# Patient Record
Sex: Male | Born: 1955 | Race: White | Hispanic: No | Marital: Single | Smoking: Never smoker
Health system: Southern US, Community
[De-identification: ages and names within clinical notes are randomized; demographics above are authoritative.]

## PROBLEM LIST (undated history)

## (undated) DIAGNOSIS — I480 Paroxysmal atrial fibrillation: Secondary | ICD-10-CM

## (undated) DIAGNOSIS — M1990 Unspecified osteoarthritis, unspecified site: Secondary | ICD-10-CM

## (undated) DIAGNOSIS — I471 Supraventricular tachycardia, unspecified: Secondary | ICD-10-CM

## (undated) DIAGNOSIS — I2510 Atherosclerotic heart disease of native coronary artery without angina pectoris: Secondary | ICD-10-CM

## (undated) DIAGNOSIS — G4730 Sleep apnea, unspecified: Secondary | ICD-10-CM

## (undated) DIAGNOSIS — E119 Type 2 diabetes mellitus without complications: Secondary | ICD-10-CM

## (undated) DIAGNOSIS — I10 Essential (primary) hypertension: Secondary | ICD-10-CM

---

## 2012-07-03 DIAGNOSIS — F32A Depression, unspecified: Secondary | ICD-10-CM | POA: Diagnosis present

## 2016-03-15 DIAGNOSIS — E1165 Type 2 diabetes mellitus with hyperglycemia: Secondary | ICD-10-CM | POA: Diagnosis present

## 2016-03-15 DIAGNOSIS — E119 Type 2 diabetes mellitus without complications: Secondary | ICD-10-CM | POA: Insufficient documentation

## 2016-03-28 ENCOUNTER — Ambulatory Visit: Payer: Self-pay | Admitting: Surgery

## 2016-08-30 ENCOUNTER — Encounter (HOSPITAL_COMMUNITY): Payer: Self-pay | Admitting: Emergency Medicine

## 2016-08-30 ENCOUNTER — Emergency Department (HOSPITAL_COMMUNITY)
Admission: EM | Admit: 2016-08-30 | Discharge: 2016-08-30 | Disposition: A | Discharge status: Home / Self Care | Payer: BC Managed Care – PPO | Attending: Emergency Medicine | Admitting: Emergency Medicine

## 2016-08-30 DIAGNOSIS — Z79899 Other long term (current) drug therapy: Secondary | ICD-10-CM | POA: Insufficient documentation

## 2016-08-30 DIAGNOSIS — Y999 Unspecified external cause status: Secondary | ICD-10-CM | POA: Insufficient documentation

## 2016-08-30 DIAGNOSIS — Y9301 Activity, walking, marching and hiking: Secondary | ICD-10-CM | POA: Diagnosis not present

## 2016-08-30 DIAGNOSIS — W19XXXA Unspecified fall, initial encounter: Secondary | ICD-10-CM

## 2016-08-30 DIAGNOSIS — Y92480 Sidewalk as the place of occurrence of the external cause: Secondary | ICD-10-CM | POA: Insufficient documentation

## 2016-08-30 DIAGNOSIS — E119 Type 2 diabetes mellitus without complications: Secondary | ICD-10-CM | POA: Diagnosis not present

## 2016-08-30 DIAGNOSIS — W000XXA Fall on same level due to ice and snow, initial encounter: Secondary | ICD-10-CM | POA: Diagnosis not present

## 2016-08-30 DIAGNOSIS — S0990XA Unspecified injury of head, initial encounter: Secondary | ICD-10-CM

## 2016-08-30 DIAGNOSIS — I10 Essential (primary) hypertension: Secondary | ICD-10-CM | POA: Diagnosis not present

## 2016-08-30 DIAGNOSIS — S0101XA Laceration without foreign body of scalp, initial encounter: Secondary | ICD-10-CM

## 2016-08-30 HISTORY — DX: Unspecified osteoarthritis, unspecified site: M19.90

## 2016-08-30 HISTORY — DX: Sleep apnea, unspecified: G47.30

## 2016-08-30 HISTORY — DX: Essential (primary) hypertension: I10

## 2016-08-30 HISTORY — DX: Type 2 diabetes mellitus without complications: E11.9

## 2016-08-30 MED ORDER — LIDOCAINE-EPINEPHRINE (PF) 2 %-1:200000 IJ SOLN
10.0000 mL | Freq: Once | INTRAMUSCULAR | Status: DC
  Filled 2016-08-30: qty 20

## 2016-08-30 MED ORDER — TETANUS-DIPHTH-ACELL PERTUSSIS 5-2.5-18.5 LF-MCG/0.5 IM SUSP
0.5000 mL | Freq: Once | INTRAMUSCULAR | Status: AC
  Administered 2016-08-30: 0.5 mL via INTRAMUSCULAR
  Filled 2016-08-30: qty 0.5

## 2016-08-30 NOTE — Discharge Instructions (Signed)

## 2016-08-30 NOTE — ED Provider Notes (Signed)
Emergency Department Provider Note   I have reviewed the triage vital signs and the nursing notes.   HISTORY  Chief Complaint Fall and Head Injury   HPI Alan Lin is a 61 y.o. male presents to the emergency department for evaluation after mechanical fall. Patient was walking on icy sidewalk when his feet went out from under him and he hit the back of his head on the ground. He reports significant pain in the back of his head along with some bleeding. No loss of consciousness. Will sleepiness. No weakness in his arms or legs. No difficulty walking. Had no vomiting since the incident. He is not anticoagulated. Mild/moderate severity. No radiation.   Past Medical History:  Diagnosis Date  . Arthritis   . Diabetes mellitus without complication (HCC)   . Hypertension   . Sleep apnea     There are no active problems to display for this patient.   No past surgical history on file.  Current Outpatient Rx  . Order #: [id number] Class: Historical Med  . Order #: [id number] Class: Historical Med  . Order #: [id number] Class: Historical Med  . Order #: [id number] Class: Historical Med  . Order #: [id number] Class: Historical Med  . Order #: [id number] Class: Historical Med  . Order #: [id number] Class: Historical Med  . Order #: [id number] Class: Historical Med  . Order #: [id number] Class: Historical Med  . Order #: [id number] Class: Historical Med    Allergies Lisinopril and Morphine and related  No family history on file.  Social History Social History  Substance Use Topics  . Smoking status: Never Smoker  . Smokeless tobacco: Never Used  . Alcohol use Not on file    Review of Systems  Constitutional: No fever/chills Eyes: No visual changes. ENT: No sore throat. Cardiovascular: Denies chest pain. Respiratory: Denies shortness of breath. Gastrointestinal: No abdominal pain.  No nausea, no vomiting.  No diarrhea.  No constipation. Genitourinary: Negative for  dysuria. Musculoskeletal: Negative for back pain. Skin: Negative for rash. Positive scalp laceration.  Neurological: Negative for headaches, focal weakness or numbness.  10-point ROS otherwise negative.  ____________________________________________   PHYSICAL EXAM:  VITAL SIGNS: ED Triage Vitals  Enc Vitals Group     BP 08/30/16 1115 148/91     Pulse Rate 08/30/16 1117 102     Resp 08/30/16 1117 18     Temp 08/30/16 1117 97.7 F (36.5 C)     Temp Source 08/30/16 1117 Oral     SpO2 08/30/16 1115 98 %     Pain Score 08/30/16 1119 3   Constitutional: Alert and oriented. Well appearing and in no acute distress. Eyes: Conjunctivae are normal.  Head: 6.0 cm laceration to the occipital scalp. Wound is hemostatic. Wound cleaned and fully visualized with no evidence of FB or skull fracture. Nose: No congestion/rhinnorhea. Mouth/Throat: Mucous membranes are moist.   Neck: No stridor. No tenderness to palpation of the cervical spine.  Cardiovascular: Normal rate, regular rhythm. Good peripheral circulation. Grossly normal heart sounds.   Respiratory: Normal respiratory effort.  No retractions. Lungs CTAB. Gastrointestinal: Soft and nontender. No distention.  Musculoskeletal: No lower extremity tenderness nor edema. No gross deformities of extremities. Neurologic:  Normal speech and language. No gross focal neurologic deficits are appreciated.  Skin:  Skin is warm, dry and intact. No rash noted. Psychiatric: Mood and affect are normal. Speech and behavior are normal.  __________________________________________  RADIOLOGY  None ____________________________________________   PROCEDURES  Procedure(s) performed:   Marland Kitchen.Marland Kitchen.Laceration Repair Date/Time: 08/30/2016 12:51 PM  Performed by: LONG, Arlyss Repress Authorized by: Maia Plan   Consent:    Consent obtained:  Verbal   Consent given by:  Patient   Risks discussed:  Infection, pain, retained foreign body, vascular damage, poor wound  healing, poor cosmetic result, need for additional repair and nerve damage   Alternatives discussed:  No treatment Universal protocol:    Procedure explained and questions answered to patient or proxy's satisfaction: yes     Relevant documents present and verified: yes     Required blood products, implants, devices, and special equipment available: yes     Patient identity confirmed:  Verbally with patient Anesthesia (see MAR for exact dosages):    Anesthesia method:  Local infiltration   Local anesthetic:  Lidocaine 2% WITH epi Laceration details:    Location:  Scalp   Scalp location:  Occipital   Length (cm):  6 Repair type:    Repair type:  Simple Pre-procedure details:    Preparation:  Patient was prepped and draped in usual sterile fashion Exploration:    Hemostasis achieved with:  Direct pressure   Wound exploration: entire depth of wound probed and visualized     Wound extent: no foreign bodies/material noted, no muscle damage noted, no nerve damage noted and no vascular damage noted   Treatment:    Area cleansed with:  Betadine and saline   Amount of cleaning:  Standard   Irrigation solution:  Sterile saline   Visualized foreign bodies/material removed: no   Skin repair:    Repair method:  Sutures   Suture size:  4-0   Suture material:  Prolene   Number of sutures:  10 Approximation:    Approximation:  Close   Vermilion border: well-aligned   Post-procedure details:    Dressing:  Open (no dressing)   Patient tolerance of procedure:  Tolerated well, no immediate complications   ____________________________________________   INITIAL IMPRESSION / ASSESSMENT AND PLAN / ED COURSE  Pertinent labs & imaging results that were available during my care of the patient were reviewed by me and considered in my medical decision making (see chart for details).  Patient resents to the emergency department for evaluation of mechanical fall while slipping on the ice. No loss of  consciousness. The patient is awake and alert with no focal motor or sensory deficits. No clear indication for CT at this time based on [place] rule. Discussed wound closure with staples and with suture. Given the patient's very short hair he prefer sutures which would be less visible at work.   At this time, I do not feel there is any life-threatening condition present. I have reviewed and discussed all results (EKG, imaging, lab, urine as appropriate), exam findings with patient. I have reviewed nursing notes and appropriate previous records.  I feel the patient is safe to be discharged home without further emergent workup. Discussed usual and customary return precautions. Patient and family (if present) verbalize understanding and are comfortable with this plan.  Patient will follow-up with their primary care provider. If they do not have a primary care provider, information for follow-up has been provided to them. All questions have been answered.  ____________________________________________  FINAL CLINICAL IMPRESSION(S) / ED DIAGNOSES  Final diagnoses:  Injury of head, initial encounter  Fall, initial encounter  Laceration of scalp, initial encounter     MEDICATIONS GIVEN DURING THIS VISIT:  Medications  Tdap (BOOSTRIX) injection 0.5 mL (0.5 mLs Intramuscular Given 08/30/16 1310)  NEW OUTPATIENT MEDICATIONS STARTED DURING THIS VISIT:  None   Note:  This document was prepared using Dragon voice recognition software and may include unintentional dictation errors.  Alona Bene, MD Emergency Medicine   Maia Plan, MD 08/30/16 [phone]

## 2016-08-30 NOTE — ED Triage Notes (Signed)
slipped in black ice this am and hit the back of his head ~ 2-3 inch lac to back of head bleeding controlled at this time, pt states  Unsure of loc at this time, is on asa, no neck or back pai at this time. , ambulatory at scene

## 2016-11-29 DIAGNOSIS — M25519 Pain in unspecified shoulder: Secondary | ICD-10-CM | POA: Insufficient documentation

## 2017-05-15 ENCOUNTER — Other Ambulatory Visit: Payer: Self-pay | Admitting: Surgery

## 2017-05-15 DIAGNOSIS — R1900 Intra-abdominal and pelvic swelling, mass and lump, unspecified site: Secondary | ICD-10-CM

## 2017-05-16 ENCOUNTER — Other Ambulatory Visit: Payer: BC Managed Care – PPO

## 2017-05-16 ENCOUNTER — Ambulatory Visit
Admission: RE | Admit: 2017-05-16 | Discharge: 2017-05-16 | Disposition: A | Discharge status: Home / Self Care | Payer: BC Managed Care – PPO | Source: Ambulatory Visit | Attending: Surgery | Admitting: Surgery

## 2017-05-16 DIAGNOSIS — R1900 Intra-abdominal and pelvic swelling, mass and lump, unspecified site: Secondary | ICD-10-CM

## 2017-05-16 MED ORDER — IOPAMIDOL (ISOVUE-300) INJECTION 61%
100.0000 mL | Freq: Once | INTRAVENOUS | Status: AC | PRN
  Administered 2017-05-16: 100 mL via INTRAVENOUS

## 2017-05-23 ENCOUNTER — Encounter: Payer: Self-pay | Admitting: Physical Therapy

## 2017-05-23 ENCOUNTER — Ambulatory Visit: Payer: BC Managed Care – PPO | Attending: Surgery | Admitting: Physical Therapy

## 2017-05-23 DIAGNOSIS — R279 Unspecified lack of coordination: Secondary | ICD-10-CM | POA: Diagnosis present

## 2017-05-23 DIAGNOSIS — M6281 Muscle weakness (generalized): Secondary | ICD-10-CM | POA: Diagnosis present

## 2017-05-23 NOTE — Therapy (Signed)
[facility] [address], [address] [place], [place], [address] Phone: [phone]   Fax:  [phone]  Physical Therapy Evaluation  Patient Details  Name: CRAIGORY TOSTE MRN: [id number] Date of Birth: 06/09/1956 Referring Provider: Dortha Schwalbe, MD   Encounter Date: 05/23/2017  PT End of Session - 05/23/17 1617    Visit Number  1    Date for PT Re-Evaluation  07/04/17    PT Start Time  1235    PT Stop Time  1314    PT Time Calculation (min)  39 min    Activity Tolerance  Patient tolerated treatment well    Behavior During Therapy  [facility] for tasks assessed/performed       Past Medical History:  Diagnosis Date  . Arthritis   . Diabetes mellitus without complication (HCC)   . Hypertension   . Sleep apnea     History reviewed. No pertinent surgical history.  There were no vitals filed for this visit.   Subjective Assessment - 05/23/17 1236    Subjective  Patient fell in March on ice and hit left leg and had RTC repair on right in July.  Pt had lipoma bulging on the right side of abdomen several months ago, then a few weeks ago the left side of abdomen began to bulged.  Pt was assessed and MD referred for abdominal wall weakness.    Pertinent History  Lt TKA, Rt RTC repair, bilateral inguinal hernia    Limitations  Other (comment);House hold activities work    Patient Stated Goals  get stronger, be able to get back to exercise like swimming, get up off the floor more easily    Currently in Pain?  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    Vocation Requirements  up and down from the floor, lifting, walking      Cognition   Overall Cognitive Status  Within Functional Limits for tasks assessed      Posture/Postural Control   Posture/Postural Control  Postural limitations    Postural Limitations  Rounded Shoulders;Increased thoracic kyphosis ribcage flared Lt>Rt      ROM / Strength   AROM / PROM / Strength  Strength      Strength   Overall Strength Comments  Rt LE 5/5; Lt LE 4/5 hip ER, IR,and  Abduction      Palpation   SI assessment   WNL, no obliquity noted    Palpation comment  soft tissue bulge       Transfers   Transfers  Sit to Stand;Floor to Transfer    Sit to Stand  7: Independent    Floor to Transfer  7: Independent;With upper extremity  assist stands up from floor uses valsalva maneuver             Objective measurements completed on examination: See above findings.    Pelvic Floor Special Questions - 05/23/17 0001    Prior Pelvic/Prostate Exam  Yes    Date of Last Pelvic/Prostate Exam  -- due for the 11th    Urinary Leakage  No       [facility] Adult PT Treatment/Exercise - 05/23/17 0001      Neuro Re-ed    Neuro Re-ed Details   educated and performed floor to stand transfer with correct breathing pattern, breathing with ball squeeze and breathing with abdominal contraction             PT Education - 05/23/17 1617    Education provided  Yes    Education Details  ball squeeze with abdominal bracing, "blow before you go"    Person(s) Educated  Patient    Methods  Explanation;Demonstration;Verbal cues;Handout    Comprehension  Verbalized understanding;Returned demonstration          PT Long Term Goals - 05/23/17 1753      PT LONG TERM GOAL #1   Title  ind with final HEP    Time  6    Period  Weeks    Status  New     Target Date  07/04/17      PT LONG TERM GOAL #2   Title  pt will report 50% less discomfort during typical work shift    Time  6    Period  Weeks    Status  New    Target Date  07/04/17      PT LONG TERM GOAL #3   Title  pt will demonstrate  trunk stability without using UE when standing up from half kneeling position    Time  6    Period  Weeks    Status  New    Target Date  07/04/17      PT LONG TERM GOAL #4   Title  pt will have 4+/5 or greater MMT of Lt hip    Time  6    Period  Weeks    Status  New    Target Date  07/04/17      PT LONG TERM GOAL #5   Title  Pt will reduce abdomen circumference by 5 cm due to improved core strength    Baseline  need to take measurement next visit    Time  6    Period  Weeks    Status  New    Target Date  07/04/17             Plan - 05/23/17 1742    Clinical Impression Statement  Patient is very friendly 61 y/o male presenting to clinic due to discomfort and distension of left abdomen.  Pt received CT scan that reports no observable mass or lipoma . Pt does have bilateral inguinal hernia of adipose tissue, but no pain associated with that.  Pt has physical job and is having more difficulty with performing job related duties as well as unable to participate in his normal exercise routine due to discomfort. Pt demonstrates overusing valsava maneuver during functional movements.  He has increase ribage flaring due to abdominal weakness.  Pt has right LE weakness of 4/5 in hip.  Pt has postural deficits and will benefit from skilled PT to work on core and postural strength with improved coordination of  muscles for stability during functional activities.    History and Personal Factors relevant to plan of care:  Lt knee replacement, fall on left side in March, Rt RTC repair July, bilateral inguinal hernia    Clinical Presentation  Stable    Clinical Presentation due to:  pt is stable    Clinical Decision Making  Moderate    Rehab Potential   Good    PT Frequency  1x / week    PT Duration  6 weeks    PT Treatment/Interventions  ADLs/Self Care Home Management;Biofeedback;Cryotherapy;Electrical Stimulation;Therapeutic exercise;Therapeutic activities;Functional mobility training;Stair training;Gait training;Neuromuscular re-education;Patient/family education;Manual techniques;Passive range of motion;Dry needling;Taping    PT Next Visit Plan  measure abdominal circumference, core strength, breathing, rib mobs, functional movements with breath    Consulted and Agree with Plan of Care  Patient       Patient will benefit from skilled therapeutic intervention in order to improve the following deficits and impairments:  Pain, Improper body mechanics, Postural dysfunction, Decreased coordination, Decreased strength  Visit Diagnosis: Muscle weakness (generalized)  Unspecified lack of coordination     Problem List There are no active problems to display for this patient.   Vincente PoliJakki Crosser, PT 05/23/2017, 5:58 PM  [facility] [address] [place], [place], [address] Phone: [phone]   Fax:  [phone]  Name: Everett GraffDavid M Lia MRN: [id number] Date of Birth: 01/20/1956

## 2017-05-23 NOTE — Patient Instructions (Signed)
   HIP ADDUCTION SQUEEZE - SUPINE  Place a rolled up towel, ball or pillow between your knees and press your knees together so that you squeeze the object firmly and engage the abdominals. Hold 5 sec and then release and repeat 2 sets of 10   [facility] [address], [address] [place], [place] [address] Phone # [phone] Fax [phone]

## 2017-05-30 ENCOUNTER — Ambulatory Visit: Payer: BC Managed Care – PPO | Admitting: Physical Therapy

## 2017-05-30 DIAGNOSIS — R279 Unspecified lack of coordination: Secondary | ICD-10-CM

## 2017-05-30 DIAGNOSIS — M6281 Muscle weakness (generalized): Secondary | ICD-10-CM

## 2017-05-30 NOTE — Patient Instructions (Signed)
Abduction: Clam (Eccentric) - Side-Lying    Lie on side with knees bent. Lift top knee, as you exhale and gently contract abdominals.  keeping feet together. Keep trunk steady. Slowly lower for 3-5 seconds. __10_ reps per set, __2_ sets per day, __5-7_ days per week.   http://ecce.exer.us/64   Copyright  VHI. All rights reserved.    Bracing With Bridging (Hook-Lying)    With neutral spine, tighten pelvic floor and abdominals and hold. Lift bottom. Repeat __10_ times. Do _2__ times a day.   Copyright  VHI. All rights reserved.    Sit to stand - inhale abdomen should bulge slightly, exhale tighten abdomen and stand on the exhale - 10x 2 sets  Balloon Breath    Place hands LIGHTLY on belly below navel. Imagine a balloon inside belly. Blow up balloon on breath IN, deflate balloon on breath OUT. Contract abdominals slightly to assist breath OUT. Time _5__ minutes.  Copyright  VHI. All rights reserved.    [facility] [address], [address] [place], [place] [address] Phone # [phone] Fax [phone]

## 2017-05-30 NOTE — Therapy (Signed)
[facility] [address] [place], [place], [address] Phone: [phone]   Fax:  [phone]  Physical Therapy Treatment  Patient Details  Name: Alan GraffDavid M Hinshaw MRN: [id number] Date of Birth: 07/02/1955 Referring Provider: Dortha Schwalbehomas A Cornett, MD   Encounter Date: 05/30/2017  PT End of Session - 05/30/17 1549    Visit Number  2    Date for PT Re-Evaluation  07/04/17    PT Start Time  1407    PT Stop Time  1452    PT Time Calculation (min)  45 min    Activity Tolerance  Patient tolerated treatment well    Behavior During Therapy  [facility] for tasks assessed/performed       Past Medical History:  Diagnosis Date  . Arthritis   . Diabetes mellitus without complication (HCC)   . Hypertension   . Sleep apnea     No past surgical history on file.  There were no vitals filed for this visit.  Subjective Assessment - 05/30/17 1409    Subjective  I don't feel pain but I have discomfort especially when sitting.  Getting up and down is a little easier doing it the way you showed me last time.    Pertinent History  Lt TKA, Rt RTC repair, bilateral inguinal hernia    Limitations  Other (comment);House hold activities    Patient Stated Goals  get stronger, be able to get back to exercise like swimming, get up off the floor more easily    Currently in Pain?  No/denies         [facility] PT Assessment - 05/30/17 0001      Observation/Other Assessments-Edema    Edema  Circumferential bulge in abdomen: 50 inch                  [facility] Adult PT Treatment/Exercise - 05/30/17 0001      Therapeutic Activites    Therapeutic Activities  Other Therapeutic Activities    Other Therapeutic Activities  sit to stand with abdominal brace      Neuro Re-ed    Neuro Re-ed Details   TrA activation with breathing and supine exercises,      Exercises   Exercises  Lumbar      Lumbar Exercises: Seated   Sit to Stand  15 reps tactile  and verbal cues      Lumbar Exercises: Supine   Clam  10 reps single leg; 10x each side    Clam Limitations  green band    Bent Knee Raise  10 reps 10x each side with exhale on the lift    Bridge  10 reps with green band around knees      Lumbar Exercises: Sidelying   Clam  10 reps sidelying on left side    Other Sidelying Lumbar Exercises  breathing in sidelying with abdominal bracing             PT Education - 05/30/17 1548    Education provided  Yes    Education Details  clam with ab set, bridge, sit to stand with brace, balloon breathing    Person(s) Educated  Patient    Methods  Explanation;Demonstration;Verbal cues;Handout    Comprehension  Verbalized understanding;Returned demonstration          PT Long Term Goals - 05/30/17 1549      PT LONG TERM GOAL #1   Title  ind with final HEP    Time  6  Period  Weeks    Status  On-going      PT LONG TERM GOAL #2   Title  pt will report 50% less discomfort during typical work shift    Time  6    Period  Weeks    Status  On-going      PT LONG TERM GOAL #3   Title  pt will demonstrate  trunk stability without using UE when standing up from half kneeling position    Time  6    Period  Weeks    Status  On-going      PT LONG TERM GOAL #4   Title  pt will have 4+/5 or greater MMT of Lt hip    Time  6    Period  Weeks    Status  On-going      PT LONG TERM GOAL #5   Title  Pt will reduce abdomen circumference by 5 cm due to improved core strength    Time  6    Period  Weeks    Status  On-going            Plan - 05/30/17 1415    Clinical Impression Statement  Patient had difficulty coordinating exhale with movements and needed a moderate amount of verbal cues.  He tends to hold breath when performing activities.  Pt did well with cues in supine, was more challenging in sitting.  He did not have increased pain or discomfort during session.  Pt needs skilled PT to work on core strength and stability during  functional movments.    PT Treatment/Interventions  ADLs/Self Care Home Management;Biofeedback;Cryotherapy;Electrical Stimulation;Therapeutic exercise;Therapeutic activities;Functional mobility training;Stair training;Gait training;Neuromuscular re-education;Patient/family education;Manual techniques;Passive range of motion;Dry needling;Taping    PT Next Visit Plan  core strength in variety of positions, breathing, rib mobs, functional movements with abdominal bracing    Consulted and Agree with Plan of Care  Patient       Patient will benefit from skilled therapeutic intervention in order to improve the following deficits and impairments:  Pain, Improper body mechanics, Postural dysfunction, Decreased coordination, Decreased strength  Visit Diagnosis: Muscle weakness (generalized)  Unspecified lack of coordination     Problem List There are no active problems to display for this patient.   Vincente PoliJakki Crosser, PT 05/30/2017, 4:11 PM  [facility] [address] [place], [place], [address] Phone: [phone]   Fax:  [phone]  Name: Alan GraffDavid M Rock MRN: [id number] Date of Birth: 03/23/1956

## 2017-06-06 ENCOUNTER — Encounter: Payer: Self-pay | Admitting: Physical Therapy

## 2017-06-06 ENCOUNTER — Ambulatory Visit: Payer: BC Managed Care – PPO | Admitting: Physical Therapy

## 2017-06-06 DIAGNOSIS — M6281 Muscle weakness (generalized): Secondary | ICD-10-CM

## 2017-06-06 DIAGNOSIS — R279 Unspecified lack of coordination: Secondary | ICD-10-CM

## 2017-06-06 NOTE — Therapy (Signed)
[facility] [address], [address] [place], [place], [address] Phone: [phone]   Fax:  [phone]  Physical Therapy Treatment  Patient Details  Name: Alan GraffDavid M Keetch MRN: [id number] Date of Birth: 04/05/1956 Referring Provider: Dortha Schwalbehomas A Cornett, MD   Encounter Date: 06/06/2017  PT End of Session - 06/06/17 1410    Visit Number  3    Date for PT Re-Evaluation  07/04/17    PT Start Time  1401    PT Stop Time  1445    PT Time Calculation (min)  44 min    Activity Tolerance  Patient tolerated treatment well    Behavior During Therapy  [facility] for tasks assessed/performed       Past Medical History:  Diagnosis Date  . Arthritis   . Diabetes mellitus without complication (HCC)   . Hypertension   . Sleep apnea     History reviewed. No pertinent surgical history.  There were no vitals filed for this visit.  Subjective Assessment - 06/06/17 1406    Subjective  I am feeling discomfort and rubbing the left side helps it feel a little better.      Pertinent History  Lt TKA, Rt RTC repair, bilateral inguinal hernia    Limitations  Other (comment);House hold activities    Patient Stated Goals  get stronger, be able to get back to exercise like swimming, get up off the floor more easily    Currently in Pain?  No/denies                      [facility] - 06/06/17 0001      Lumbar Exercises: Standing   Other Standing Lumbar Exercises  wall push ups      Lumbar Exercises: Seated   Sit to Stand  20 reps holding 4lb and keeping knees neutral, exhale with lift      Lumbar Exercises: Supine   Ab Set  15 reps rolling red ball with abdominal brace    Other Supine Lumbar Exercises  foam roller vertical under sacrum - single and double knee to chest; rocking side to side      Knee/Hip Exercises: Standing   Hip Abduction  Stengthening;Right;Left;3 sets;10 reps side stepping 10 steps each way x 3  - green band around LE    SLS  one foot on BOSU UE flexion, pallof punches and rotation - 15x each side - green band      Knee/Hip Exercises: Seated   Sit to Sand  15 reps;without UE support bracing when standing             PT Education - 06/06/17 1537    Education provided  Yes    Education Details  side step, trunk rotation with band     Person(s) Educated  Patient    Methods  Explanation;Demonstration;Verbal cues;Handout    Comprehension  Verbalized understanding;Returned demonstration          PT Long Term Goals - 05/30/17 1549      PT LONG TERM GOAL #1   Title  ind with final HEP    Time  6    Period  Weeks    Status  On-going      PT LONG TERM GOAL #2   Title  pt will report 50% less discomfort during typical work shift    Time  6    Period  Weeks    Status  On-going  PT LONG TERM GOAL #3   Title  pt will demonstrate  trunk stability without using UE when standing up from half kneeling position    Time  6    Period  Weeks    Status  On-going      PT LONG TERM GOAL #4   Title  pt will have 4+/5 or greater MMT of Lt hip    Time  6    Period  Weeks    Status  On-going      PT LONG TERM GOAL #5   Title  Pt will reduce abdomen circumference by 5 cm due to improved core strength    Time  6    Period  Weeks    Status  On-going            Plan - 06/06/17 1538    Clinical Impression Statement  Patient needs cues during all exercises to breathe upon exertion.  He needed verbal and tactile cues to keep shoulders down and not using upper traps.  Pt was able to perform exercises today without pain and was able to increase level of difficulty adding some lifting and single leg standing exercises.  Pt continues to need skilled PT to work on strength and core stability during functional movements    Rehab Potential  Good    PT Treatment/Interventions  ADLs/Self Care Home Management;Biofeedback;Cryotherapy;Electrical Stimulation;Therapeutic  exercise;Therapeutic activities;Functional mobility training;Stair training;Gait training;Neuromuscular re-education;Patient/family education;Manual techniques;Passive range of motion;Dry needling;Taping    PT Next Visit Plan  core strength in variety of positions, breathing, functional movements with abdominal bracing, lunges    Consulted and Agree with Plan of Care  Patient       Patient will benefit from skilled therapeutic intervention in order to improve the following deficits and impairments:  Pain, Improper body mechanics, Postural dysfunction, Decreased coordination, Decreased strength  Visit Diagnosis: Muscle weakness (generalized)  Unspecified lack of coordination     Problem List There are no active problems to display for this patient.   Vincente PoliJakki Crosser, PT 06/06/2017, 3:50 PM  [facility] [address], [address] [place], [place], [address] Phone: [phone]   Fax:  [phone]  Name: Alan GraffDavid M [place] MRN: [id number] Date of Birth: 12/05/1955

## 2017-06-06 NOTE — Patient Instructions (Signed)
   Rotation keeping your hips neutral and rotating at the core - exhale as you rotate 20x each side     Keep abdominals braced as you step to the side - 30x to each side  [facility] [address], [address] [place], [place] [address] Phone # [phone] Fax [phone]

## 2017-06-14 ENCOUNTER — Ambulatory Visit: Payer: BC Managed Care – PPO | Admitting: Physical Therapy

## 2017-06-14 ENCOUNTER — Encounter: Payer: Self-pay | Admitting: Physical Therapy

## 2017-06-14 DIAGNOSIS — M6281 Muscle weakness (generalized): Secondary | ICD-10-CM

## 2017-06-14 DIAGNOSIS — R279 Unspecified lack of coordination: Secondary | ICD-10-CM

## 2017-06-14 NOTE — Patient Instructions (Signed)
Sitting on the ball exercises:   You want the largest size to sit on.   Sit, make sure legs are not super wide, right angles hips and knees. Sit tall: pull lower abs in and up/hugging the spine. Feel your ribs lift away from the pelvis. Soften the chin and upper shoulder/neck muscles.   Add arm movements, shoulder stretches, and practice the marching/knee lift. Remember the most important thing is to preserve the posture and core contraction thoughout.

## 2017-06-14 NOTE — Therapy (Signed)
[facility] [address], [address] [place], [place], [address] Phone: [phone]   Fax:  [phone]  Physical Therapy Treatment  Patient Details  Name: Alan GraffDavid M [place]ine MRN: [id number] Date of Birth: 10/23/1955 Referring Provider: Dortha Schwalbehomas A Cornett, MD   Encounter Date: 06/14/2017  PT End of Session - 06/14/17 0934    Visit Number  4    Date for PT Re-Evaluation  07/04/17    PT Start Time  0934    PT Stop Time  1015    PT Time Calculation (min)  41 min    Activity Tolerance  Patient tolerated treatment well    Behavior During Therapy  [facility] for tasks assessed/performed       Past Medical History:  Diagnosis Date  . Arthritis   . Diabetes mellitus without complication (HCC)   . Hypertension   . Sleep apnea     History reviewed. No pertinent surgical history.  There were no vitals filed for this visit.  Subjective Assessment - 06/14/17 0935    Subjective  Just got up, feeling not too bad this AM.     Pertinent History  Lt TKA, Rt RTC repair, bilateral inguinal hernia    Limitations  Other (comment);House hold activities    Patient Stated Goals  get stronger, be able to get back to exercise like swimming, get up off the floor more easily    Currently in Pain?  No/denies    Multiple Pain Sites  No                      [facility] Adult PT Treatment/Exercise - 06/14/17 0001      Lumbar Exercises: Aerobic   Stationary Bike  L1 x 6 min PTA present for status.      Lumbar Exercises: Standing   Other Standing Lumbar Exercises  green band side steps 20 feet 6x VC for posture and core activation      Lumbar Exercises: Seated   Sit to Stand  -- Green band horiz abd 2x10/VC for core  and LE alignment      Knee/Hip Exercises: Standing   SLS  Level surface 3x 20 sec  Cannot do without some UE support             PT Education - 06/14/17 1009    Education provided  Yes    Education Details  Physioball  exercises for core strength that pt can go at the gym    Person(s) Educated  Patient    Methods  Explanation;Demonstration;Tactile cues;Verbal cues;Handout    Comprehension  Verbalized understanding;Returned demonstration          PT Long Term Goals - 05/30/17 1549      PT LONG TERM GOAL #1   Title  ind with final HEP    Time  6    Period  Weeks    Status  On-going      PT LONG TERM GOAL #2   Title  pt will report 50% less discomfort during typical work shift    Time  6    Period  Weeks    Status  On-going      PT LONG TERM GOAL #3   Title  pt will demonstrate  trunk stability without using UE when standing up from half kneeling position    Time  6    Period  Weeks    Status  On-going      PT LONG  TERM GOAL #4   Title  pt will have 4+/5 or greater MMT of Lt hip    Time  6    Period  Weeks    Status  On-going      PT LONG TERM GOAL #5   Title  Pt will reduce abdomen circumference by 5 cm due to improved core strength    Time  6    Period  Weeks    Status  On-going            Plan - 06/14/17 0935    Clinical Impression Statement  Pt motivated to exercise and appears compliant with all HEP. We discussed adding his swimming back in and/or walking on his off days. We added some physioball exercises he can try at the gym for core strength. Pt struggles with his LTLE proprioception, but improves with practice.     Rehab Potential  Good    PT Frequency  1x / week    PT Duration  6 weeks    PT Treatment/Interventions  ADLs/Self Care Home Management;Biofeedback;Cryotherapy;Electrical Stimulation;Therapeutic exercise;Therapeutic activities;Functional mobility training;Stair training;Gait training;Neuromuscular re-education;Patient/family education;Manual techniques;Passive range of motion;Dry needling;Taping    PT Next Visit Plan  core strength in variety of positions, breathing, functional movements with abdominal bracing, lunges    Consulted and Agree with Plan of Care   Patient       Patient will benefit from skilled therapeutic intervention in order to improve the following deficits and impairments:  Pain, Improper body mechanics, Postural dysfunction, Decreased coordination, Decreased strength  Visit Diagnosis: Muscle weakness (generalized)  Unspecified lack of coordination     Problem List There are no active problems to display for this patient.   Aras Albarran, PTA 06/14/2017, 10:17 AM  [facility] [address], [address] [place], [place], [address] Phone: [phone]   Fax:  [phone]  Name: Alan GraffDavid M Emmanuel MRN: [id number] Date of Birth: 04/01/1956

## 2017-06-26 ENCOUNTER — Ambulatory Visit: Payer: BC Managed Care – PPO | Admitting: Physical Therapy

## 2017-06-28 ENCOUNTER — Encounter: Payer: Self-pay | Admitting: Physical Therapy

## 2017-06-28 ENCOUNTER — Ambulatory Visit: Payer: BC Managed Care – PPO | Attending: Surgery | Admitting: Physical Therapy

## 2017-06-28 DIAGNOSIS — R279 Unspecified lack of coordination: Secondary | ICD-10-CM | POA: Insufficient documentation

## 2017-06-28 DIAGNOSIS — M6281 Muscle weakness (generalized): Secondary | ICD-10-CM | POA: Insufficient documentation

## 2017-06-28 NOTE — Therapy (Signed)
[facility] [address] [place], [place], [address] Phone: [phone]   Fax:  [phone]  Physical Therapy Treatment  Patient Details  Name: Alan Lin MRN: [id number] Date of Birth: 05-Jun-1956 Referring Provider: Dortha Schwalbe, MD   Encounter Date: 06/28/2017  PT End of Session - 06/28/17 1023    Visit Number  5    Date for PT Re-Evaluation  07/04/17    PT Start Time  1018    PT Stop Time  1100    PT Time Calculation (min)  42 min    Activity Tolerance  Patient tolerated treatment well    Behavior During Therapy  [facility] for tasks assessed/performed       Past Medical History:  Diagnosis Date  . Arthritis   . Diabetes mellitus without complication (HCC)   . Hypertension   . Sleep apnea     History reviewed. No pertinent surgical history.  There were no vitals filed for this visit.  Subjective Assessment - 06/28/17 1022    Subjective  I am doing the exercises every day.  I have a headache today because of a dental issue.      Pertinent History  Lt TKA, Rt RTC repair, bilateral inguinal hernia    Limitations  Other (comment);House hold activities    Patient Stated Goals  get stronger, be able to get back to exercise like swimming, get up off the floor more easily    Currently in Pain?  No/denies                      [facility] Adult PT Treatment/Exercise - 06/28/17 0001      Neuro Re-ed    Neuro Re-ed Details   Posture alignment pelvis, ribcage, shoulder girdle, cervical alignment in sitting position, cervical retraction in supine and standing      Lumbar Exercises: Aerobic   Stationary Bike  L1 x 8 min PT present for status.      Knee/Hip Exercises: Standing   Walking with Sports Cord  25 lb 4 ways - 8x each slow and controlled with core engaged                  PT Long Term Goals - 06/28/17 1025      PT LONG TERM GOAL #1   Title  ind with final HEP    Time  6    Period  Weeks    Status  On-going      PT LONG TERM GOAL #2   Title  pt will report 50% less discomfort during typical work shift    Baseline  about the same, but feeling stronger, can do what I need to do at work      PT LONG TERM GOAL #3   Title  pt will demonstrate  trunk stability without using UE when standing up from half kneeling position    Baseline  was able to get up and down from floor    Time  6    Period  Weeks    Status  Achieved      PT LONG TERM GOAL #4   Title  pt will have 4+/5 or greater MMT of Lt hip    Time  6    Period  Weeks            Plan - 06/28/17 1105    Clinical Impression Statement  Patient demonstrated improved posture with cues in  sitting to find relaxed position.  He had some difficutly with cervical retraction and upper trap spasming when doing cervical retraction in standing and supine and end range of motion.  He was able to perform correctly with verbal and tactile cues.  He has been able to get up from the floor with greater control with some UE support.  Pt will most likely discharge at next session if he feels confident with the HEP to progress core strength.    PT Treatment/Interventions  ADLs/Self Care Home Management;Biofeedback;Cryotherapy;Electrical Stimulation;Therapeutic exercise;Therapeutic activities;Functional mobility training;Stair training;Gait training;Neuromuscular re-education;Patient/family education;Manual techniques;Passive range of motion;Dry needling;Taping    PT Next Visit Plan  posture, thoracic extension, lying supine on foam roller, final HEP and MMT, possible discharge       Patient will benefit from skilled therapeutic intervention in order to improve the following deficits and impairments:  Pain, Improper body mechanics, Postural dysfunction, Decreased coordination, Decreased strength  Visit Diagnosis: Muscle weakness (generalized)  Unspecified lack of coordination     Problem List There are no active  problems to display for this patient.   Vincente PoliJakki Crosser, PT 06/28/2017, 11:19 AM  [facility] [address], [address] [place], [place], [address] Phone: [phone]   Fax:  [phone]  Name: Alan Lin MRN: [id number] Date of Birth: 10/24/1955

## 2017-07-03 ENCOUNTER — Ambulatory Visit: Payer: BC Managed Care – PPO | Admitting: Physical Therapy

## 2017-07-04 ENCOUNTER — Ambulatory Visit: Payer: BC Managed Care – PPO | Admitting: Physical Therapy

## 2017-07-04 ENCOUNTER — Encounter: Payer: Self-pay | Admitting: Physical Therapy

## 2017-07-04 DIAGNOSIS — M6281 Muscle weakness (generalized): Secondary | ICD-10-CM

## 2017-07-04 DIAGNOSIS — R279 Unspecified lack of coordination: Secondary | ICD-10-CM

## 2017-07-04 NOTE — Patient Instructions (Signed)
   BRIDGE - MARCHING  While lying on your back, tighten your lower abdominals, squeeze your buttocks and then raise your buttocks off the floor/bed as creating a "Bridge" with your body.   While holding this position, lift one leg while maintaining a level pelvis. Set it back to the floor and then lift the opposite leg.   10x each side    Keep low back pressed against mat or floor while doing this exercise, tap foot down close to your bottom  3 sets of 10 [facility] [address], [address] [place], [place] [address] Phone # [phone] Fax [phone]

## 2017-07-04 NOTE — Therapy (Addendum)
[facility] [address], [address] [place], [place], [address] Phone: [phone]   Fax:  [phone]  Physical Therapy Treatment  Patient Details  Name: Alan Lin MRN: [id number] Date of Birth: 09/17/1955 Referring Provider: Thomas A Cornett, MD   Encounter Date: 07/04/2017  PT End of Session - 07/04/17 1014    Visit Number  6    Date for PT Re-Evaluation  07/04/17    PT Start Time  1012    PT Stop Time  1059    PT Time Calculation (min)  47 min    Activity Tolerance  Patient tolerated treatment well    Behavior During Therapy  WFL for tasks assessed/performed       Past Medical History:  Diagnosis Date  . Arthritis   . Diabetes mellitus without complication (HCC)   . Hypertension   . Sleep apnea     History reviewed. No pertinent surgical history.  There were no vitals filed for this visit.  Subjective Assessment - 07/04/17 1018    Subjective  The neck hurt for the rest of the day after the last session.  Pt reports feeling like he is more aware of his weakness.    Pertinent History  Lt TKA, Rt RTC repair, bilateral inguinal hernia    Limitations  Other (comment);House hold activities    Patient Stated Goals  get stronger, be able to get back to exercise like swimming, get up off the floor more easily    Currently in Pain?  No/denies                      [facility] Adult PT Treatment/Exercise - 07/04/17 0001      Lumbar Exercises: Aerobic   Stationary Bike  L1 x 9 min      Lumbar Exercises: Standing   Shoulder ADduction  Strengthening;Both;20 reps power tower - PNF adduction - 20 lb     Other Standing Lumbar Exercises  wall push up 10x; wall push up on ball 10x      Lumbar Exercises: Supine   Dead Bug  10 reps LE only 3 sets    Bridge  10 reps mini bridge with marching    Large Ball Abdominal Isometric  10 reps    Other Supine Lumbar Exercises  pelvic tilt, 5 sec hold - 10x      Lumbar  Exercises: Quadruped   Single Arm Raise  5 reps;Right;Left;1 second    Straight Leg Raise  5 reps      Knee/Hip Exercises: Machines for Strengthening   Total Gym Leg Press  bilateral 80 lb 20x; single leg 3 x10 50 lb             PT Education - 07/04/17 1059    Education provided  Yes    Education Details  bridge march and dying bug    Person(s) Educated  Patient    Methods  Explanation;Demonstration;Handout;Verbal cues;Tactile cues    Comprehension  Verbalized understanding;Returned demonstration          PT Long Term Goals - 07/04/17 1015      PT LONG TERM GOAL #1   Title  ind with final HEP    Time  6    Period  Weeks    Status  On-going      PT LONG TERM GOAL #2   Title  pt will report 50% less discomfort during typical work shift    Baseline    I feel about the same, the discomfort varies, usually gets worse as the day goes on    Time  6    Period  Weeks    Status  On-going      PT LONG TERM GOAL #4   Title  pt will have 4+/5 or greater MMT of Lt hip    Time  6    Period  Weeks    Status  On-going      PT LONG TERM GOAL #5   Title  Pt will reduce abdomen circumference by 5 cm due to improved core strength    Baseline  need to take measurement next visit    Time  6    Status  On-going            Plan - 07/04/17 1113    Clinical Impression Statement  Patient did well with exercises today needing instructions during all exercises for improved form and body awareness.  Pt felt good doing bridges with marching and dying bug exercises and felt those were challenging but not too difficult.  Pt will benefit from skilled PT to continue working on core and functional movements with improved stability.    PT Treatment/Interventions  ADLs/Self Care Home Management;Biofeedback;Cryotherapy;Electrical Stimulation;Therapeutic exercise;Therapeutic activities;Functional mobility training;Stair training;Gait training;Neuromuscular re-education;Patient/family  education;Manual techniques;Passive range of motion;Dry needling;Taping    PT Next Visit Plan  single leg and unilateral exercises for improved core and postural symmetry, final HEP, most likely discharge today    Consulted and Agree with Plan of Care  Patient       Patient will benefit from skilled therapeutic intervention in order to improve the following deficits and impairments:  Pain, Improper body mechanics, Postural dysfunction, Decreased coordination, Decreased strength  Visit Diagnosis: Muscle weakness (generalized)  Unspecified lack of coordination     Problem List There are no active problems to display for this patient.   Jakki Crosser, PT 07/04/2017, 11:37 AM  [facility] [address], [address] [place], [place], [address] Phone: [phone]   Fax:  [phone]  Name: Alan Lin MRN: [id number] Date of Birth: 07/08/1955  PHYSICAL THERAPY DISCHARGE SUMMARY  Visits from Start of Care: 6  Current functional level related to goals / functional outcomes: See above details   Remaining deficits: See above   Education / Equipment: HEP  Plan: Patient agrees to discharge.  Patient goals were not met. Patient is being discharged due to not returning since the last visit.  ?????     Pt was potentially going to come for one more visit and did not return  Jakki Crosser, PT 08/23/17 8:40 AM  

## 2017-07-24 DIAGNOSIS — R198 Other specified symptoms and signs involving the digestive system and abdomen: Secondary | ICD-10-CM | POA: Insufficient documentation

## 2018-07-30 DIAGNOSIS — M7918 Myalgia, other site: Secondary | ICD-10-CM | POA: Insufficient documentation

## 2018-07-30 DIAGNOSIS — M25551 Pain in right hip: Secondary | ICD-10-CM | POA: Insufficient documentation

## 2018-07-30 DIAGNOSIS — M170 Bilateral primary osteoarthritis of knee: Secondary | ICD-10-CM | POA: Insufficient documentation

## 2019-07-16 DIAGNOSIS — F419 Anxiety disorder, unspecified: Secondary | ICD-10-CM | POA: Insufficient documentation

## 2019-09-20 DIAGNOSIS — M5412 Radiculopathy, cervical region: Secondary | ICD-10-CM | POA: Insufficient documentation

## 2019-09-20 DIAGNOSIS — M5126 Other intervertebral disc displacement, lumbar region: Secondary | ICD-10-CM | POA: Insufficient documentation

## 2019-09-25 ENCOUNTER — Other Ambulatory Visit: Payer: Self-pay | Admitting: Chiropractic Medicine

## 2019-09-25 DIAGNOSIS — M5412 Radiculopathy, cervical region: Secondary | ICD-10-CM

## 2019-10-01 ENCOUNTER — Ambulatory Visit
Admission: RE | Admit: 2019-10-01 | Discharge: 2019-10-01 | Disposition: A | Discharge status: Home / Self Care | Payer: BC Managed Care – PPO | Source: Ambulatory Visit | Attending: Chiropractic Medicine | Admitting: Chiropractic Medicine

## 2019-10-01 ENCOUNTER — Other Ambulatory Visit: Payer: Self-pay

## 2019-10-01 DIAGNOSIS — M5412 Radiculopathy, cervical region: Secondary | ICD-10-CM

## 2019-10-01 MED ORDER — IOPAMIDOL (ISOVUE-M 300) INJECTION 61%: 1.0000 mL | Freq: Once | INTRAMUSCULAR | Status: DC

## 2019-10-01 MED ORDER — TRIAMCINOLONE ACETONIDE 40 MG/ML IJ SUSP (RADIOLOGY): 60.0000 mg | Freq: Once | INTRAMUSCULAR | Status: DC

## 2019-10-28 DIAGNOSIS — M5030 Other cervical disc degeneration, unspecified cervical region: Secondary | ICD-10-CM | POA: Insufficient documentation

## 2019-10-31 ENCOUNTER — Other Ambulatory Visit: Payer: Self-pay | Admitting: Physical Medicine and Rehabilitation

## 2019-10-31 DIAGNOSIS — M542 Cervicalgia: Secondary | ICD-10-CM

## 2019-11-01 DIAGNOSIS — M51369 Other intervertebral disc degeneration, lumbar region without mention of lumbar back pain or lower extremity pain: Secondary | ICD-10-CM | POA: Insufficient documentation

## 2019-11-01 DIAGNOSIS — M5136 Other intervertebral disc degeneration, lumbar region: Secondary | ICD-10-CM | POA: Insufficient documentation

## 2019-11-05 ENCOUNTER — Other Ambulatory Visit: Payer: Self-pay | Admitting: Physical Medicine and Rehabilitation

## 2019-11-05 DIAGNOSIS — M5136 Other intervertebral disc degeneration, lumbar region: Secondary | ICD-10-CM

## 2019-11-20 ENCOUNTER — Other Ambulatory Visit: Payer: Self-pay

## 2019-11-20 ENCOUNTER — Ambulatory Visit
Admission: RE | Admit: 2019-11-20 | Discharge: 2019-11-20 | Disposition: A | Discharge status: Home / Self Care | Payer: BC Managed Care – PPO | Source: Ambulatory Visit | Attending: Physical Medicine and Rehabilitation | Admitting: Physical Medicine and Rehabilitation

## 2019-11-20 DIAGNOSIS — M5136 Other intervertebral disc degeneration, lumbar region: Secondary | ICD-10-CM

## 2019-11-20 MED ORDER — IOPAMIDOL (ISOVUE-M 200) INJECTION 41%
1.0000 mL | Freq: Once | INTRAMUSCULAR | Status: AC
  Administered 2019-11-20: 1 mL via EPIDURAL

## 2019-11-20 MED ORDER — METHYLPREDNISOLONE ACETATE 40 MG/ML INJ SUSP (RADIOLOG
120.0000 mg | Freq: Once | INTRAMUSCULAR | Status: AC
  Administered 2019-11-20: 120 mg via EPIDURAL

## 2019-11-20 NOTE — Discharge Instructions (Signed)

## 2019-11-26 ENCOUNTER — Other Ambulatory Visit: Payer: Self-pay

## 2019-11-26 ENCOUNTER — Ambulatory Visit
Admission: RE | Admit: 2019-11-26 | Discharge: 2019-11-26 | Disposition: A | Discharge status: Home / Self Care | Payer: BC Managed Care – PPO | Source: Ambulatory Visit | Attending: Physical Medicine and Rehabilitation | Admitting: Physical Medicine and Rehabilitation

## 2019-11-26 DIAGNOSIS — M542 Cervicalgia: Secondary | ICD-10-CM

## 2019-12-11 ENCOUNTER — Other Ambulatory Visit: Payer: Self-pay | Admitting: Chiropractic Medicine

## 2019-12-11 DIAGNOSIS — M5416 Radiculopathy, lumbar region: Secondary | ICD-10-CM

## 2019-12-24 ENCOUNTER — Other Ambulatory Visit: Payer: Self-pay

## 2019-12-24 ENCOUNTER — Ambulatory Visit
Admission: RE | Admit: 2019-12-24 | Discharge: 2019-12-24 | Disposition: A | Discharge status: Home / Self Care | Payer: BC Managed Care – PPO | Source: Ambulatory Visit | Attending: Chiropractic Medicine | Admitting: Chiropractic Medicine

## 2019-12-24 DIAGNOSIS — M5416 Radiculopathy, lumbar region: Secondary | ICD-10-CM

## 2019-12-24 IMAGING — XA Imaging study
3 series · 3 of 3 positions shown · non-contrast
Comparison: none

CLINICAL DATA: Lumbosacral spondylosis without myelopathy. Chronic
right-sided low back pain radiating into the right leg. 30% relief
for a week after L4-L5 interlaminar injection one month ago.

[Series 3: ortho adipose · 1 of 1 slices shown (1 of 3)]
[im 1/1]
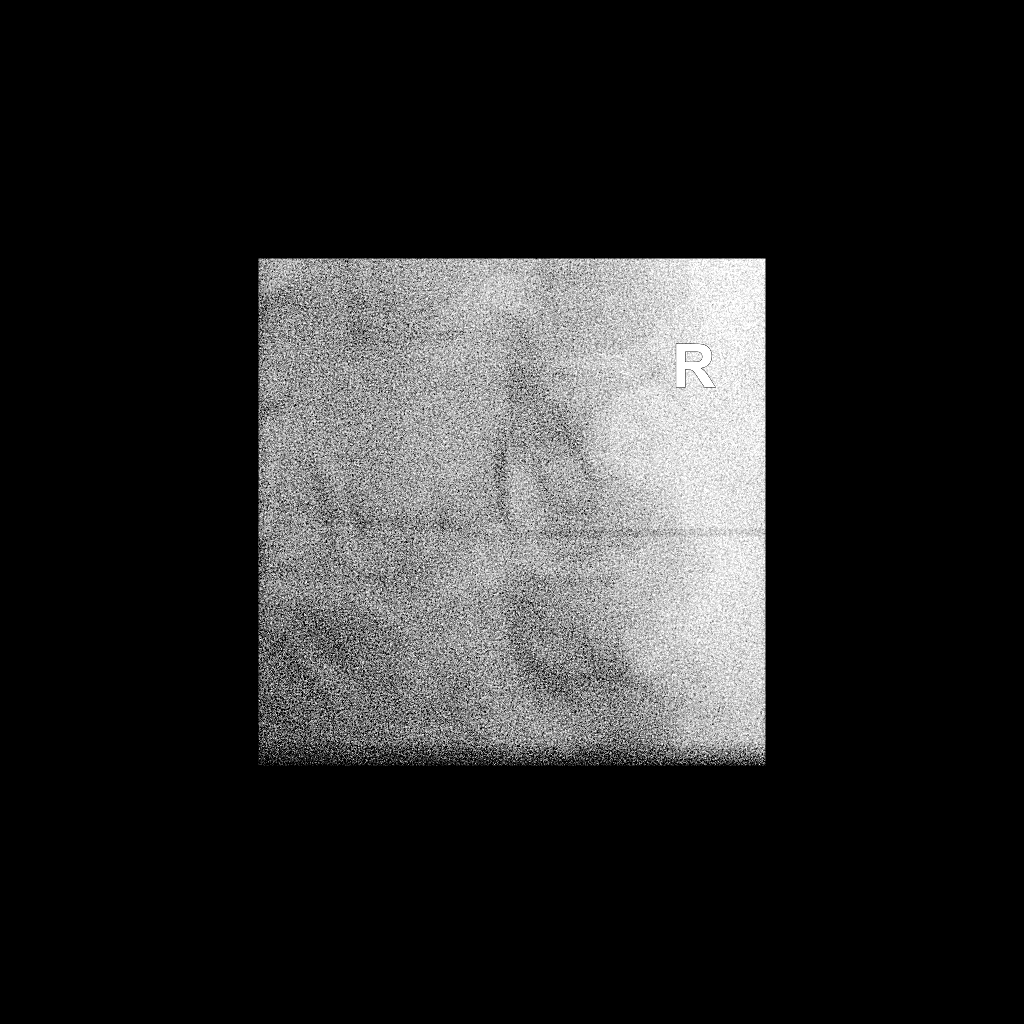

[Series 4: ortho adipose · 1 of 1 slices shown (2 of 3)]
[im 1/1]
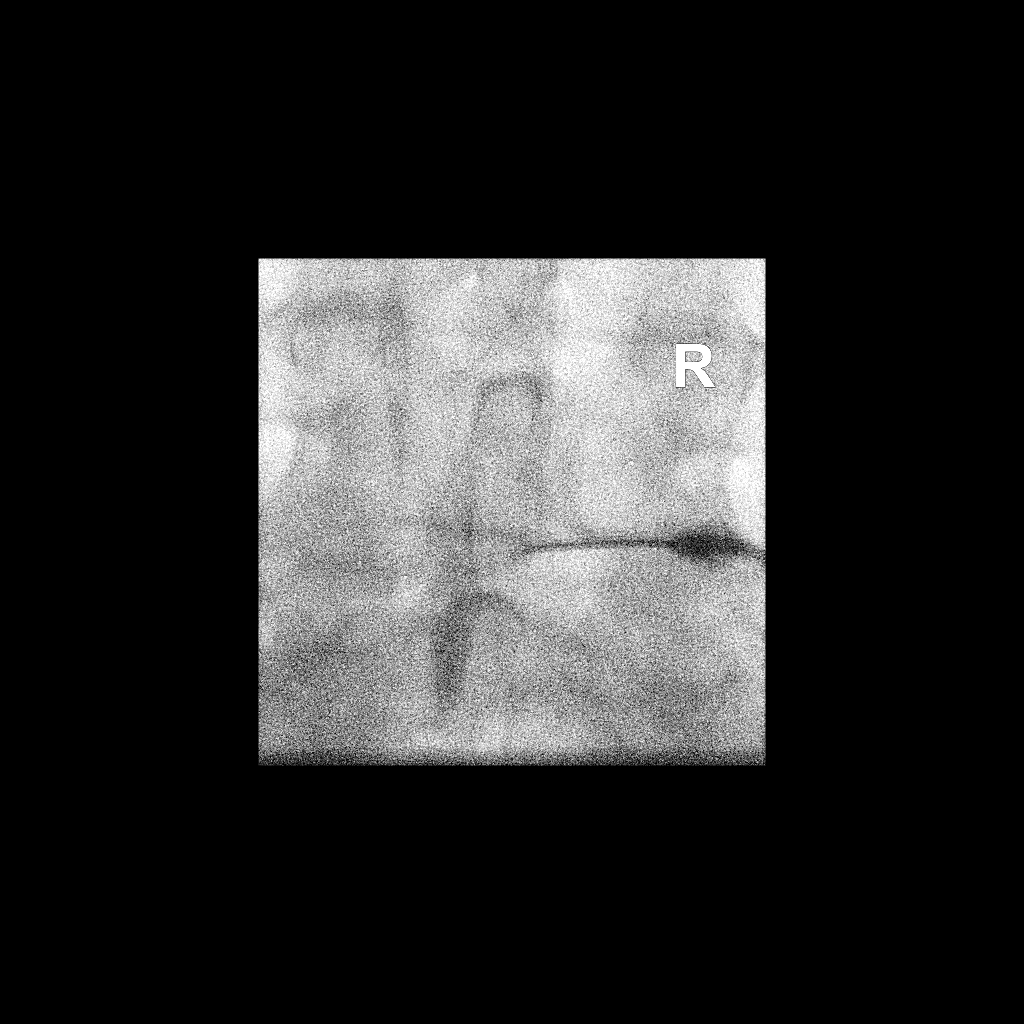

[Series 5: ortho adipose · 1 of 1 slices shown (3 of 3)]
[im 1/1]
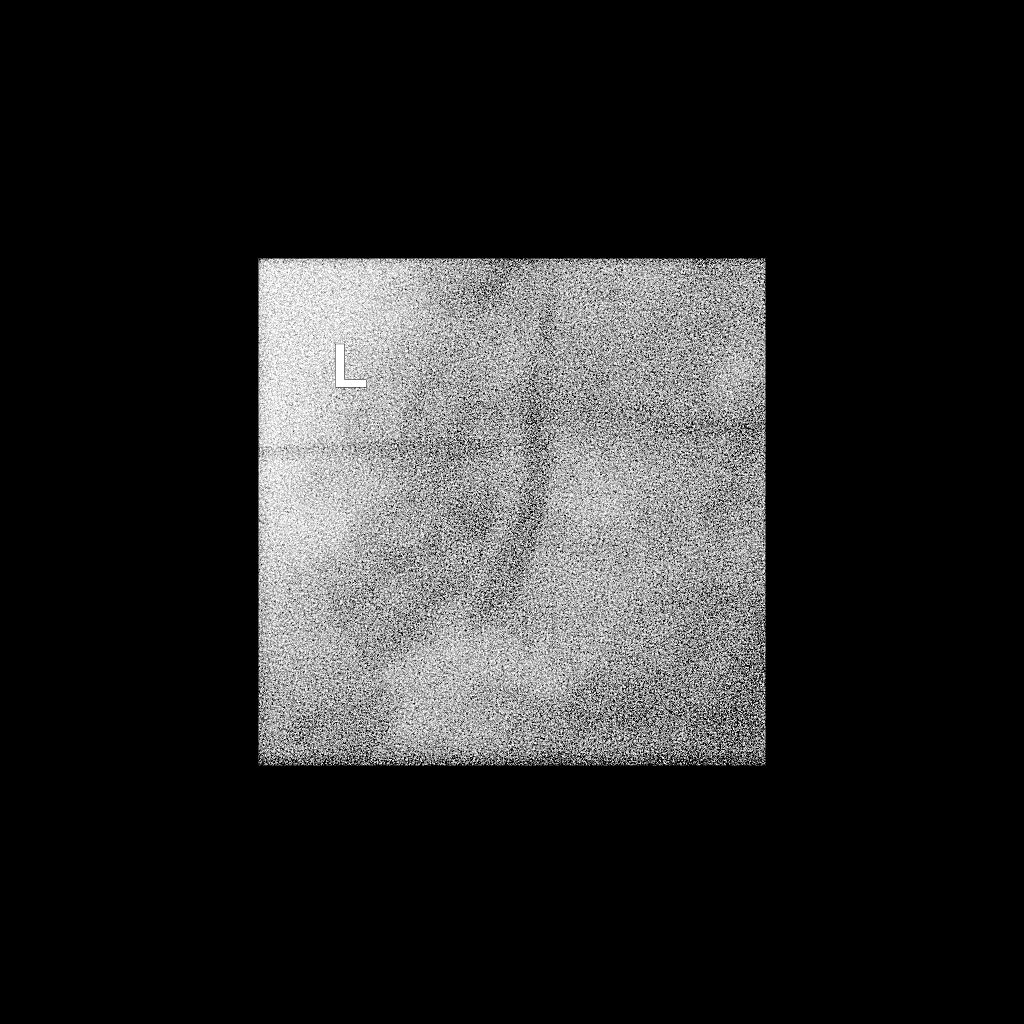

[3 of 3 positions shown; findings below may reference images not displayed]

FLUOROSCOPY TIME:  Radiation Exposure Index (as provided by the
fluoroscopic device): 29.9 mGy

Fluoroscopy Time:  1 minute, 41 seconds

Number of Acquired Images:  0

PROCEDURE:
The procedure, risks, benefits, and alternatives were explained to
the patient. Questions regarding the procedure were encouraged and
answered. The patient understands and consents to the procedure.

LUMBAR EPIDURAL INJECTION:

A transforaminal approach was attempted on the right at L5-S1 but
the needle could not be appropriately positioned close enough to the
L5 nerve root due to bony hypertrophy from the right L5-S1 facet and
the patient's overlapping right iliac bone.

An interlaminar approach was therefore performed on the right at
L4-L5. The overlying skin was cleansed and anesthetized. A 3.5 inch
20 gauge epidural needle was advanced using loss-of-resistance
technique.

DIAGNOSTIC EPIDURAL INJECTION:

Injection of Isovue-M 200 shows a good epidural pattern with spread
above and below the level of needle placement, primarily to both
sides. No vascular opacification is seen.

THERAPEUTIC EPIDURAL INJECTION:

120 mg of Depo-Medrol mixed with 2 mL of 1% lidocaine were
instilled. The procedure was well-tolerated, and the patient was
discharged thirty minutes following the injection in good condition.

COMPLICATIONS:
None immediate.
IMPRESSION: Technically successful interlaminar epidural injection on the right
at L4-L5.

## 2019-12-24 MED ORDER — METHYLPREDNISOLONE ACETATE 40 MG/ML INJ SUSP (RADIOLOG
120.0000 mg | Freq: Once | INTRAMUSCULAR | Status: AC
  Administered 2019-12-24: 15:00:00 120 mg via EPIDURAL

## 2019-12-24 MED ORDER — IOPAMIDOL (ISOVUE-M 200) INJECTION 41%
1.0000 mL | Freq: Once | INTRAMUSCULAR | Status: AC
  Administered 2019-12-24: 1 mL via EPIDURAL

## 2019-12-24 NOTE — Discharge Instructions (Signed)

## 2022-05-05 ENCOUNTER — Encounter: Payer: Self-pay | Admitting: Neurology

## 2022-05-21 ENCOUNTER — Emergency Department (HOSPITAL_COMMUNITY): Payer: Medicare Other

## 2022-05-21 ENCOUNTER — Encounter (HOSPITAL_COMMUNITY): Payer: Self-pay | Admitting: *Deleted

## 2022-05-21 ENCOUNTER — Emergency Department (HOSPITAL_COMMUNITY)
Admission: EM | Admit: 2022-05-21 | Discharge: 2022-05-21 | Disposition: A | Discharge status: Home / Self Care | Payer: Medicare Other | Attending: Emergency Medicine | Admitting: Emergency Medicine

## 2022-05-21 ENCOUNTER — Other Ambulatory Visit: Payer: Self-pay

## 2022-05-21 DIAGNOSIS — W010XXA Fall on same level from slipping, tripping and stumbling without subsequent striking against object, initial encounter: Secondary | ICD-10-CM | POA: Diagnosis not present

## 2022-05-21 DIAGNOSIS — K429 Umbilical hernia without obstruction or gangrene: Secondary | ICD-10-CM | POA: Diagnosis not present

## 2022-05-21 DIAGNOSIS — E871 Hypo-osmolality and hyponatremia: Secondary | ICD-10-CM | POA: Diagnosis not present

## 2022-05-21 DIAGNOSIS — K7689 Other specified diseases of liver: Secondary | ICD-10-CM | POA: Insufficient documentation

## 2022-05-21 DIAGNOSIS — S7002XA Contusion of left hip, initial encounter: Secondary | ICD-10-CM | POA: Diagnosis not present

## 2022-05-21 DIAGNOSIS — K760 Fatty (change of) liver, not elsewhere classified: Secondary | ICD-10-CM | POA: Insufficient documentation

## 2022-05-21 DIAGNOSIS — S79912A Unspecified injury of left hip, initial encounter: Secondary | ICD-10-CM | POA: Diagnosis present

## 2022-05-21 DIAGNOSIS — G8929 Other chronic pain: Secondary | ICD-10-CM | POA: Insufficient documentation

## 2022-05-21 DIAGNOSIS — Z7982 Long term (current) use of aspirin: Secondary | ICD-10-CM | POA: Insufficient documentation

## 2022-05-21 DIAGNOSIS — Z79899 Other long term (current) drug therapy: Secondary | ICD-10-CM | POA: Diagnosis not present

## 2022-05-21 DIAGNOSIS — N200 Calculus of kidney: Secondary | ICD-10-CM | POA: Diagnosis not present

## 2022-05-21 DIAGNOSIS — M542 Cervicalgia: Secondary | ICD-10-CM | POA: Diagnosis not present

## 2022-05-21 DIAGNOSIS — K5790 Diverticulosis of intestine, part unspecified, without perforation or abscess without bleeding: Secondary | ICD-10-CM | POA: Diagnosis not present

## 2022-05-21 DIAGNOSIS — K4090 Unilateral inguinal hernia, without obstruction or gangrene, not specified as recurrent: Secondary | ICD-10-CM | POA: Diagnosis not present

## 2022-05-21 DIAGNOSIS — I10 Essential (primary) hypertension: Secondary | ICD-10-CM | POA: Diagnosis not present

## 2022-05-21 DIAGNOSIS — D72829 Elevated white blood cell count, unspecified: Secondary | ICD-10-CM | POA: Diagnosis not present

## 2022-05-21 DIAGNOSIS — J40 Bronchitis, not specified as acute or chronic: Secondary | ICD-10-CM | POA: Insufficient documentation

## 2022-05-21 DIAGNOSIS — W19XXXA Unspecified fall, initial encounter: Secondary | ICD-10-CM

## 2022-05-21 DIAGNOSIS — Y92003 Bedroom of unspecified non-institutional (private) residence as the place of occurrence of the external cause: Secondary | ICD-10-CM | POA: Insufficient documentation

## 2022-05-21 DIAGNOSIS — E1165 Type 2 diabetes mellitus with hyperglycemia: Secondary | ICD-10-CM | POA: Diagnosis not present

## 2022-05-21 DIAGNOSIS — K5900 Constipation, unspecified: Secondary | ICD-10-CM | POA: Insufficient documentation

## 2022-05-21 DIAGNOSIS — I2510 Atherosclerotic heart disease of native coronary artery without angina pectoris: Secondary | ICD-10-CM | POA: Insufficient documentation

## 2022-05-21 LAB — COMPREHENSIVE METABOLIC PANEL
ALT: 34 U/L (ref 0–44)
AST: 25 U/L (ref 15–41)
Albumin: 3.9 g/dL (ref 3.5–5.0)
Alkaline Phosphatase: 85 U/L (ref 38–126)
Anion gap: 16 — ABNORMAL HIGH (ref 5–15)
BUN: 21 mg/dL (ref 8–23)
CO2: 20 mmol/L — ABNORMAL LOW (ref 22–32)
Calcium: 9.8 mg/dL (ref 8.9–10.3)
Chloride: 96 mmol/L — ABNORMAL LOW (ref 98–111)
Creatinine, Ser: 0.89 mg/dL (ref 0.61–1.24)
GFR, Estimated: >60 mL/min (ref >60)
Glucose, Bld: 322 mg/dL — ABNORMAL HIGH (ref 70–99)
Potassium: 3.6 mmol/L (ref 3.5–5.1)
Sodium: 132 mmol/L — ABNORMAL LOW (ref 135–145)
Total Bilirubin: 0.5 mg/dL (ref 0.3–1.2)
Total Protein: 5.7 g/dL — ABNORMAL LOW (ref 6.5–8.1)

## 2022-05-21 LAB — URINALYSIS, ROUTINE W REFLEX MICROSCOPIC
Bilirubin Urine: NEGATIVE
Glucose, UA: >=500 mg/dL — AB
Hgb urine dipstick: NEGATIVE
Ketones, ur: NEGATIVE mg/dL
Leukocytes,Ua: NEGATIVE
Nitrite: NEGATIVE
Protein, ur: NEGATIVE mg/dL
Specific Gravity, Urine: 1.03 (ref 1.005–1.030)
pH: 6 (ref 5.0–8.0)

## 2022-05-21 LAB — CBC WITH DIFFERENTIAL/PLATELET
Abs Immature Granulocytes: 0.15 10*3/uL — ABNORMAL HIGH (ref 0.00–0.07)
Basophils Absolute: 0.1 10*3/uL (ref 0.0–0.1)
Basophils Relative: 1 %
Eosinophils Absolute: 0.1 10*3/uL (ref 0.0–0.5)
Eosinophils Relative: 1 %
HCT: 44.7 % (ref 39.0–52.0)
Hemoglobin: 15.6 g/dL (ref 13.0–17.0)
Immature Granulocytes: 1 %
Lymphocytes Relative: 20 %
Lymphs Abs: 2.3 10*3/uL (ref 0.7–4.0)
MCH: 29.2 pg (ref 26.0–34.0)
MCHC: 34.9 g/dL (ref 30.0–36.0)
MCV: 83.7 fL (ref 80.0–100.0)
Monocytes Absolute: 1 10*3/uL (ref 0.1–1.0)
Monocytes Relative: 9 %
Neutro Abs: 7.9 10*3/uL — ABNORMAL HIGH (ref 1.7–7.7)
Neutrophils Relative %: 68 %
Platelets: 250 10*3/uL (ref 150–400)
RBC: 5.34 MIL/uL (ref 4.22–5.81)
RDW: 13.2 % (ref 11.5–15.5)
WBC: 11.5 10*3/uL — ABNORMAL HIGH (ref 4.0–10.5)
nRBC: 0 % (ref 0.0–0.2)

## 2022-05-21 LAB — CK: Total CK: 332 U/L (ref 49–397)

## 2022-05-21 MED ORDER — SODIUM CHLORIDE 0.9 % IV BOLUS
500.0000 mL | Freq: Once | INTRAVENOUS | Status: AC
  Administered 2022-05-21: 500 mL via INTRAVENOUS

## 2022-05-21 MED ORDER — ONDANSETRON HCL 4 MG/2ML IJ SOLN
4.0000 mg | Freq: Once | INTRAMUSCULAR | Status: AC
  Administered 2022-05-21: 4 mg via INTRAVENOUS
  Filled 2022-05-21: qty 2

## 2022-05-21 MED ORDER — FENTANYL CITRATE PF 50 MCG/ML IJ SOSY
50.0000 ug | PREFILLED_SYRINGE | Freq: Once | INTRAMUSCULAR | Status: AC
  Administered 2022-05-21: 50 ug via INTRAVENOUS
  Filled 2022-05-21: qty 1

## 2022-05-21 MED ORDER — IOHEXOL 350 MG/ML SOLN
100.0000 mL | Freq: Once | INTRAVENOUS | Status: AC | PRN
  Administered 2022-05-21: 100 mL via INTRAVENOUS

## 2022-05-21 NOTE — ED Notes (Signed)
Patient transported to CT 

## 2022-05-21 NOTE — ED Notes (Signed)
Pt ambulated in hallway with walker. Pt had a shuffling gait. Pts left leg dragged a couple times. Provider aware. Pt ensures he is walking at baseline.

## 2022-05-21 NOTE — ED Notes (Signed)
Pt to c-t before blood was drawn

## 2022-05-21 NOTE — ED Notes (Signed)
Pt placed on 2 lpm [place] d/t desating while falling asleep, once awake pts sats increased to 93% on RA. Pt endorses having "really bad sleep apnea"

## 2022-05-21 NOTE — Discharge Instructions (Addendum)
You had CT scans performed in the emergency department.  Please follow-up with your family doctor for repeat evaluation.  You may use Lidoderm, available over-the-counter according to label instructions for your hip pain. Your CT scan report is as follows.  CLINICAL DATA:  Frequent falls, suspected pulmonary embolism, blunt abdominal trauma, back pain, and right hip pain.  EXAM: CT ANGIOGRAPHY CHEST  CT ABDOMEN AND PELVIS WITH CONTRAST  TECHNIQUE: Multidetector CT imaging of the chest was performed using the standard protocol during bolus administration of intravenous contrast. Multiplanar CT image reconstructions and MIPs were obtained to evaluate the vascular anatomy. Multidetector CT imaging of the abdomen and pelvis was performed using the standard protocol during bolus administration of intravenous contrast.  RADIATION DOSE REDUCTION: This exam was performed according to the departmental dose-optimization program which includes automated exposure control, adjustment of the mA and/or kV according to patient size and/or use of iterative reconstruction technique.  CONTRAST:  OMNIPAQUE IOHEXOL 350 MG/ML SOLN  COMPARISON:  CT abdomen pelvis with IV contrast 05/16/2017, portable chest today.  FINDINGS: CTA CHEST FINDINGS  Cardiovascular: There is mild panchamber cardiomegaly with lipomatous infiltration of the interatrial septum, three-vessel patchy calcific CAD and no pericardial effusion. Pulmonary arteries are normal in caliber and do not show evidence of arterial emboli.  The pulmonary veins are decompressed. There is no aortic aneurysm, stenosis or dissection. No flow-limiting great vessel stenosis is evident, with moderate patchy calcific plaques in the great vessels and moderate to heavy calcifications in the thoracic aorta.  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 Thyroid gland and axillary spaces are unremarkable. There is no  intrathoracic adenopathy. Unremarkable thoracic esophagus and thoracic trachea and main bronchi.  Lungs/Pleura: No infiltrate or nodule is evident. There is posterior atelectasis. There is a moderately elevated right hemidiaphragm consistent with eventration or paresis with overlying linear atelectasis. There is mild bronchial thickening. No bronchial impactions seen.  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 The ribcage is intact. No focal chest wall abnormality.  Review of the MIP images confirms the above findings.  CT ABDOMEN and PELVIS FINDINGS  Hepatobiliary: Enlarged liver measuring 23 cm in length with moderate steatosis. Stable 1.5 cm cyst lateral dome of the liver is redemonstrated, Hounsfield density of 1.2. Rest of the parenchyma is homogeneous. Gallbladder and bile ducts are unremarkable. The main portal vein patent and mildly prominent, as before measuring 15 mm.  Pancreas: No abnormality.  Spleen: Normal in size with homogeneous enhancement.  Adrenals/Urinary Tract: There is no adrenal mass. In the upper pole left kidney, again noted is a 1.6 cm homogeneous cyst, Hounsfield density of 15, and several scattered bilateral renal cortical hypodensities which are too small to characterize, some of which are new and some of which were present on the prior study.  There is no mass enhancement, and no follow-up imaging is recommended.  There nonobstructive caliceal stones in the right upper pole measuring 3 mm and 4 mm. No nephrolithiasis is seen on the left.  There is no ureteral stone or hydronephrosis. The bladder thickness is normal.  Stomach/Bowel: No dilatation or wall thickening including the appendix. There are scattered uncomplicated colonic diverticula, moderate fecal stasis in the ascending colon. Mild fecal stasis in the remainder.  Vascular/Lymphatic: There is heavy aortoiliac calcific plaque without  AAA. No adenopathy is seen.  Reproductive: No prostatomegaly.  Other: There are small umbilical and inguinal fat hernias. No incarcerated hernia. There is no free fluid, free hemorrhage or free  air. Moderate subcutaneous stranding laterally at the left hip is noted but no space-occupying hematoma although the area of involvement is not fully seen.  Musculoskeletal: There is no spinal compression fracture. No regional skeletal fracture is seen. Degenerative changes lumbar spine. Bridging osteophytes anterior right SI joint. Mild pelvic enthesopathy.  Review of the MIP images confirms the above findings.  IMPRESSION: 1. No evidence of pulmonary arterial emboli. 2. Cardiomegaly without evidence of CHF. 3. Aortic and coronary artery atherosclerosis. 4. Bronchitis without evidence of pneumonia. 5. Moderately elevated right hemidiaphragm consistent with eventration or paresis with overlying linear atelectasis. 6. Moderate subcutaneous stranding laterally at the left hip but no space-occupying hematoma. No other acute trauma related findings seen elsewhere. 7. Nonobstructive nephrolithiasis. 8. Constipation and diverticulosis. 9. Small umbilical and inguinal fat hernias. 10. Moderate hepatic steatosis with stable 1.5 cm cyst in the dome of the liver. 11. No regional skeletal fracture is seen. 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

## 2022-05-21 NOTE — ED Provider Notes (Signed)
[facility]   CSN: [id number] Arrival date & time: 05/21/22  0018     History  Chief Complaint  Patient presents with   Alan FesterFall    Alan Lin is a 66 y.o. male.  The history is provided by the patient, the EMS personnel and medical records.  Fall  Alan Lin is a 66 y.o. male who presents to the Emergency Department complaining of fall.  He presents to the emergency department for evaluation of injuries following a fall.  On Thursday he was taking out his trash in the afternoon and slipped and fell, landing on his left hip.  Neighbors were immediately available to assist him back to his home.  He has been having pain in the left hip and difficulty walking since this time.  This evening he had a second fall and EMS was called because he was unable to get back into bed.  He reports neck pain since the fall but does have chronic neck pain as well as left hip pain.  No reports of fevers, chest pain, shortness of breath, nausea, vomiting, dysuria.  He has a history of hypertension, diabetes.    Tetanus is up-to-date Home Medications Prior to Admission medications   Medication Sig Start Date End Date Taking? Authorizing Provider  amLODipine (NORVASC) 5 MG tablet Take 5 mg by mouth daily. 07/23/16   [provider]  chlorthalidone (HYGROTON) 25 MG tablet Take 25 mg by mouth daily. 07/07/16   [provider]  [employer] ASPIRIN CHILD 81 MG chewable tablet Chew 81 mg by mouth daily. 06/22/16   [provider]  cyclobenzaprine (FLEXERIL) 10 MG tablet Take 10 mg by mouth daily as needed for muscle spasms.    [provider]  diclofenac Sodium (VOLTAREN) 1 % GEL Apply 2 g topically 4 (four) times daily. 09/20/19   [provider]  Docusate Sodium (DSS) 100 MG CAPS docusate sodium 100 mg capsule  TAKE 1 CAPSULE BY MOUTH 2 TIMES DAILY.    [provider]  DULoxetine (CYMBALTA) 20 MG capsule  duloxetine 20 mg capsule,delayed release  TAKE 2 CAPSULES BY MOUTH EVERY DAY 08/28/19   [provider]  gabapentin (NEURONTIN) 300 MG capsule Take 300 mg by mouth daily. 08/03/16   [provider]  glyBURIDE (DIABETA) 2.5 MG tablet Take 2.5 mg by mouth daily. 08/10/16   [provider]  HYDROcodone-acetaminophen (NORCO) 10-325 MG tablet hydrocodone 10 mg-acetaminophen 325 mg tablet  Take 1 tablet 4 times a day by oral route as needed.    [provider]  losartan (COZAAR) 50 MG tablet Take 50 mg by mouth daily. 07/14/16   [provider]  metoprolol succinate (TOPROL-XL) 25 MG 24 hr tablet metoprolol succinate ER 25 mg tablet,extended release 24 hr 10/15/19   [provider]  Multiple Vitamins-Minerals (MULTIVITAMIN WITH MINERALS) tablet Take 1 tablet by mouth daily.    [provider]  simvastatin (ZOCOR) 20 MG tablet Take 20 mg by mouth daily. 07/07/16   [provider]  zolpidem (AMBIEN) 5 MG tablet Take by mouth. 06/03/19   [provider]      Allergies    Metformin, Lisinopril, and Morphine and related    Review of Systems   Review of Systems  All other systems reviewed and are negative.   Physical Exam Updated Vital Signs BP 112/64   Pulse (!) 103   Temp 98 F (36.7 C)   Resp 19  Ht 5\' 10"  (1.778 m)   Wt 117.9 kg   SpO2 94%   BMI 37.31 kg/m  Physical Exam Vitals and nursing note reviewed.  Constitutional:      Appearance: He is well-developed.  HENT:     Head: Normocephalic and atraumatic.  Neck:     Comments: Mild diffuse cervical spine tenderness. Cardiovascular:     Rate and Rhythm: Normal rate and regular rhythm.     Heart sounds: No murmur heard. Pulmonary:     Effort: Pulmonary effort is normal. No respiratory distress.     Breath sounds: Normal breath sounds.  Abdominal:     Palpations: Abdomen is soft.     Tenderness: There is no abdominal tenderness. There is no guarding or  rebound.  Musculoskeletal:     Comments: Large area of ecchymosis to the left hip.  2+ DP pulses bilaterally.  He is able to range the left hip but does have tenderness to palpation over the hip.  Abrasion over the left knee, no significant knee tenderness.  Skin:    General: Skin is warm and dry.  Neurological:     Mental Status: He is alert and oriented to person, place, and time.     Comments: 5 out of 5 strength in all 4 extremities  Psychiatric:        Behavior: Behavior normal.     ED Results / Procedures / Treatments   Labs (all labs ordered are listed, but only abnormal results are displayed) Labs Reviewed  COMPREHENSIVE METABOLIC PANEL - Abnormal; Notable for the following components:      Result Value   Sodium 132 (*)    Chloride 96 (*)    CO2 20 (*)    Glucose, Bld 322 (*)    Total Protein 5.7 (*)    Anion gap 16 (*)    All other components within normal limits  CBC WITH DIFFERENTIAL/PLATELET - Abnormal; Notable for the following components:   WBC 11.5 (*)    Neutro Abs 7.9 (*)    Abs Immature Granulocytes 0.15 (*)    All other components within normal limits  URINALYSIS, ROUTINE W REFLEX MICROSCOPIC - Abnormal; Notable for the following components:   Glucose, UA >=500 (*)    Bacteria, UA RARE (*)    All other components within normal limits  CK    EKG None  Radiology CT Angio Chest PE W/Cm &/Or Wo Cm  Result Date: 05/21/2022 CLINICAL DATA:  Frequent falls, suspected pulmonary embolism, blunt abdominal trauma, back pain, and right hip pain. EXAM: CT ANGIOGRAPHY CHEST CT ABDOMEN AND PELVIS WITH CONTRAST TECHNIQUE: Multidetector CT imaging of the chest was performed using the standard protocol during bolus administration of intravenous contrast. Multiplanar CT image reconstructions and MIPs were obtained to evaluate the vascular anatomy. Multidetector CT imaging of the abdomen and pelvis was performed using the standard protocol during bolus administration of  intravenous contrast. RADIATION DOSE REDUCTION: This exam was performed according to the departmental dose-optimization program which includes automated exposure control, adjustment of the mA and/or kV according to patient size and/or use of iterative reconstruction technique. CONTRAST:  14/07/2021 OMNIPAQUE IOHEXOL 350 MG/ML SOLN COMPARISON:  CT abdomen pelvis with IV contrast 05/16/2017, portable chest today. FINDINGS: CTA CHEST FINDINGS Cardiovascular: There is mild panchamber cardiomegaly with lipomatous infiltration of the interatrial septum, three-vessel patchy calcific CAD and no pericardial effusion. Pulmonary arteries are normal in caliber and do not show evidence of arterial emboli. The pulmonary veins are decompressed. There  is no aortic aneurysm, stenosis or dissection. No flow-limiting great vessel stenosis is evident, with moderate patchy calcific plaques in the great vessels and moderate to heavy calcifications in the thoracic aorta. 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 Thyroid gland and axillary spaces are unremarkable. There is no intrathoracic adenopathy. Unremarkable thoracic esophagus and thoracic trachea and main bronchi. Lungs/Pleura: No infiltrate or nodule is evident. There is posterior atelectasis. There is a moderately elevated right hemidiaphragm consistent with eventration or paresis with overlying linear atelectasis. There is mild bronchial thickening. No bronchial impactions seen. 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 The ribcage is intact. No focal chest wall abnormality. Review of the MIP images confirms the above findings. CT ABDOMEN and PELVIS FINDINGS Hepatobiliary: Enlarged liver measuring 23 cm in length with moderate steatosis. Stable 1.5 cm cyst lateral dome of the liver is redemonstrated, Hounsfield density of 1.2. Rest of the parenchyma is homogeneous. Gallbladder and bile ducts are unremarkable. The main portal  vein patent and mildly prominent, as before measuring 15 mm. Pancreas: No abnormality. Spleen: Normal in size with homogeneous enhancement. Adrenals/Urinary Tract: There is no adrenal mass. In the upper pole left kidney, again noted is a 1.6 cm homogeneous cyst, Hounsfield density of 15, and several scattered bilateral renal cortical hypodensities which are too small to characterize, some of which are new and some of which were present on the prior study. There is no mass enhancement, and no follow-up imaging is recommended. There nonobstructive caliceal stones in the right upper pole measuring 3 mm and 4 mm. No nephrolithiasis is seen on the left. There is no ureteral stone or hydronephrosis. The bladder thickness is normal. Stomach/Bowel: No dilatation or wall thickening including the appendix. There are scattered uncomplicated colonic diverticula, moderate fecal stasis in the ascending colon. Mild fecal stasis in the remainder. Vascular/Lymphatic: There is heavy aortoiliac calcific plaque without AAA. No adenopathy is seen. Reproductive: No prostatomegaly. Other: There are small umbilical and inguinal fat hernias. No incarcerated hernia. There is no free fluid, free hemorrhage or free air. Moderate subcutaneous stranding laterally at the left hip is noted but no space-occupying hematoma although the area of involvement is not fully seen. Musculoskeletal: There is no spinal compression fracture. No regional skeletal fracture is seen. Degenerative changes lumbar spine. Bridging osteophytes anterior right SI joint. Mild pelvic enthesopathy. Review of the MIP images confirms the above findings. IMPRESSION: 1. No evidence of pulmonary arterial emboli. 2. Cardiomegaly without evidence of CHF. 3. Aortic and coronary artery atherosclerosis. 4. Bronchitis without evidence of pneumonia. 5. Moderately elevated right hemidiaphragm consistent with eventration or paresis with overlying linear atelectasis. 6. Moderate  subcutaneous stranding laterally at the left hip but no space-occupying hematoma. No other acute trauma related findings seen elsewhere. 7. Nonobstructive nephrolithiasis. 8. Constipation and diverticulosis. 9. Small umbilical and inguinal fat hernias. 10. Moderate hepatic steatosis with stable 1.5 cm cyst in the dome of the liver. 11. No regional skeletal fracture is seen. 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 Electronically Signed   By: Almira Bar M.D.   On: 05/21/2022 06:01   CT Abdomen Pelvis W Contrast  Result Date: 05/21/2022 CLINICAL DATA:  Frequent falls, suspected pulmonary embolism, blunt abdominal trauma, back pain, and right hip pain. EXAM: CT ANGIOGRAPHY CHEST CT ABDOMEN AND PELVIS WITH CONTRAST TECHNIQUE: Multidetector CT imaging of the chest was performed using the standard protocol during bolus administration of intravenous contrast. Multiplanar CT image reconstructions and MIPs were obtained to  evaluate the vascular anatomy. Multidetector CT imaging of the abdomen and pelvis was performed using the standard protocol during bolus administration of intravenous contrast. RADIATION DOSE REDUCTION: This exam was performed according to the departmental dose-optimization program which includes automated exposure control, adjustment of the mA and/or kV according to patient size and/or use of iterative reconstruction technique. CONTRAST:  OMNIPAQUE IOHEXOL 350 MG/ML SOLN COMPARISON:  CT abdomen pelvis with IV contrast 05/16/2017, portable chest today. FINDINGS: CTA CHEST FINDINGS Cardiovascular: There is mild panchamber cardiomegaly with lipomatous infiltration of the interatrial septum, three-vessel patchy calcific CAD and no pericardial effusion. Pulmonary arteries are normal in caliber and do not show evidence of arterial emboli. The pulmonary veins are decompressed. There is no aortic aneurysm, stenosis or dissection. No flow-limiting great vessel stenosis is  evident, with moderate patchy calcific plaques in the great vessels and moderate to heavy calcifications in the thoracic aorta. 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 Thyroid gland and axillary spaces are unremarkable. There is no intrathoracic adenopathy. Unremarkable thoracic esophagus and thoracic trachea and main bronchi. Lungs/Pleura: No infiltrate or nodule is evident. There is posterior atelectasis. There is a moderately elevated right hemidiaphragm consistent with eventration or paresis with overlying linear atelectasis. There is mild bronchial thickening. No bronchial impactions seen. 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 The ribcage is intact. No focal chest wall abnormality. Review of the MIP images confirms the above findings. CT ABDOMEN and PELVIS FINDINGS Hepatobiliary: Enlarged liver measuring 23 cm in length with moderate steatosis. Stable 1.5 cm cyst lateral dome of the liver is redemonstrated, Hounsfield density of 1.2. Rest of the parenchyma is homogeneous. Gallbladder and bile ducts are unremarkable. The main portal vein patent and mildly prominent, as before measuring 15 mm. Pancreas: No abnormality. Spleen: Normal in size with homogeneous enhancement. Adrenals/Urinary Tract: There is no adrenal mass. In the upper pole left kidney, again noted is a 1.6 cm homogeneous cyst, Hounsfield density of 15, and several scattered bilateral renal cortical hypodensities which are too small to characterize, some of which are new and some of which were present on the prior study. There is no mass enhancement, and no follow-up imaging is recommended. There nonobstructive caliceal stones in the right upper pole measuring 3 mm and 4 mm. No nephrolithiasis is seen on the left. There is no ureteral stone or hydronephrosis. The bladder thickness is normal. Stomach/Bowel: No dilatation or wall thickening including the appendix. There are scattered  uncomplicated colonic diverticula, moderate fecal stasis in the ascending colon. Mild fecal stasis in the remainder. Vascular/Lymphatic: There is heavy aortoiliac calcific plaque without AAA. No adenopathy is seen. Reproductive: No prostatomegaly. Other: There are small umbilical and inguinal fat hernias. No incarcerated hernia. There is no free fluid, free hemorrhage or free air. Moderate subcutaneous stranding laterally at the left hip is noted but no space-occupying hematoma although the area of involvement is not fully seen. Musculoskeletal: There is no spinal compression fracture. No regional skeletal fracture is seen. Degenerative changes lumbar spine. Bridging osteophytes anterior right SI joint. Mild pelvic enthesopathy. Review of the MIP images confirms the above findings. IMPRESSION: 1. No evidence of pulmonary arterial emboli. 2. Cardiomegaly without evidence of CHF. 3. Aortic and coronary artery atherosclerosis. 4. Bronchitis without evidence of pneumonia. 5. Moderately elevated right hemidiaphragm consistent with eventration or paresis with overlying linear atelectasis. 6. Moderate subcutaneous stranding laterally at the left hip but no space-occupying hematoma. No other acute trauma related findings seen elsewhere. 7. Nonobstructive  nephrolithiasis. 8. Constipation and diverticulosis. 9. Small umbilical and inguinal fat hernias. 10. Moderate hepatic steatosis with stable 1.5 cm cyst in the dome of the liver. 11. No regional skeletal fracture is seen. 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 Electronically Signed   By: Almira Bar M.D.   On: 05/21/2022 06:01   CT Head Wo Contrast  Result Date: 05/21/2022 CLINICAL DATA:  Head trauma, minor (Age >= 65y) EXAM: CT HEAD WITHOUT CONTRAST TECHNIQUE: Contiguous axial images were obtained from the base of the skull through the vertex without intravenous contrast. RADIATION DOSE REDUCTION: This exam was performed according to the  departmental dose-optimization program which includes automated exposure control, adjustment of the mA and/or kV according to patient size and/or use of iterative reconstruction technique. COMPARISON:  None Available. FINDINGS: Brain: There is periventricular white matter decreased attenuation consistent with small vessel ischemic changes. Gray-white differentiation is preserved. No acute intracranial hemorrhage, mass effect or shift. No hydrocephalus. Vascular: No hyperdense vessel or unexpected calcification. Skull: Normal. Negative for fracture or focal lesion. Sinuses/Orbits: No acute finding. IMPRESSION: Periventricular white matter changes consistent with chronic small vessel ischemia. No acute intracranial process identified. Electronically Signed   By: Layla Maw M.D.   On: 05/21/2022 01:13   CT Cervical Spine Wo Contrast  Result Date: 05/21/2022 CLINICAL DATA:  Neck trauma (Age >= 65y) EXAM: CT CERVICAL SPINE WITHOUT CONTRAST TECHNIQUE: Multidetector CT imaging of the cervical spine was performed without intravenous contrast. Multiplanar CT image reconstructions were also generated. RADIATION DOSE REDUCTION: This exam was performed according to the departmental dose-optimization program which includes automated exposure control, adjustment of the mA and/or kV according to patient size and/or use of iterative reconstruction technique. COMPARISON:  None Available. FINDINGS: Alignment: Normal. Skull base and vertebrae: No acute fracture. No primary bone lesion or focal pathologic process. Soft tissues and spinal canal: No prevertebral fluid or swelling. No visible canal hematoma. Disc levels: Patient is status post ACDF C4-C5. Disc space narrowing and marginal osteophytes identified at C5-6 through C7-T1. Upper chest: Negative. IMPRESSION: Degenerative and postsurgical changes. No acute traumatic abnormalities. Electronically Signed   By: Layla Maw M.D.   On: 05/21/2022 01:11   DG Chest Port  1 View  Result Date: 05/21/2022 CLINICAL DATA:  Fall EXAM: PORTABLE CHEST 1 VIEW COMPARISON:  None Available. FINDINGS: The heart size and mediastinal contours are within normal limits. Both lungs are clear. No pneumothorax or pleural effusion. Aorta is calcified. The visualized skeletal structures are unremarkable. IMPRESSION: No active disease. Electronically Signed   By: Layla Maw M.D.   On: 05/21/2022 01:01   DG Hip Unilat W or Wo Pelvis 2-3 Views Left  Result Date: 05/21/2022 CLINICAL DATA:  Fall EXAM: DG HIP (WITH OR WITHOUT PELVIS) 3V LEFT COMPARISON:  None Available. FINDINGS: Degenerative changes identified with sclerosis, joint space narrowing and osteophytes. No definite fracture, dislocation or subluxation. Pelvic ring appears intact. IMPRESSION: No acute osseous abnormalities identified. There are degenerative changes. Electronically Signed   By: Layla Maw M.D.   On: 05/21/2022 01:00    Procedures Procedures    Medications Ordered in ED Medications  fentaNYL (SUBLIMAZE) injection 50 mcg (50 mcg Intravenous Given 05/21/22 0203)  ondansetron (ZOFRAN) injection 4 mg (4 mg Intravenous Given 05/21/22 0159)  sodium chloride 0.9 % bolus 500 mL (0 mLs Intravenous Stopped 05/21/22 0320)  iohexol (OMNIPAQUE) 350 MG/ML injection 100 mL (100 mLs Intravenous Contrast Given 05/21/22 0528)    ED Course/ Medical Decision Making/  A&P                           Medical Decision Making Amount and/or Complexity of Data Reviewed Labs: ordered. Radiology: ordered.  Risk Prescription drug management.   Patient with history of diabetes, degenerative disc disease, chronic pain here for evaluation following multiple falls.  He has ecchymosis over the left hip.  He is able to range it without difficulty but does have pain on ambulation.  Plain films are negative for acute fracture or dislocation-images personally reviewed and agree with radiologist interpretation.  Labs are significant for  mild hyponatremia and hyperglycemia.  CBC with mild leukocytosis, no evidence of acute infectious process.  UA is not consistent with UTI.  Given his syncopal event, falls CT head as well as CTA PE study and CT abdomen pelvis were obtained.  Imaging is negative for acute pulmonary embolism, fracture or intracranial abnormality.  Patient is able to ambulate in the emergency department.  He does have difficulty with moving his left lower extremity but states that this is around his baseline ambulation.        Final Clinical Impression(s) / ED Diagnoses Final diagnoses:  Fall, initial encounter  Contusion of left hip, initial encounter    Rx / DC Orders ED Discharge Orders     None         Tilden Fossa, MD 05/21/22 [id number]

## 2022-05-21 NOTE — ED Triage Notes (Signed)
The pt arrived by [facility] from home where he lives alone.  He fell Thursday outside and he has bruising to his rt hip and the rt thigh  and today he fell inside his bedroom  he is c/o some neck pain  .  He is not on blood thinners

## 2022-06-17 ENCOUNTER — Other Ambulatory Visit: Payer: Self-pay

## 2022-06-17 ENCOUNTER — Emergency Department (HOSPITAL_COMMUNITY)
Admission: EM | Admit: 2022-06-17 | Discharge: 2022-06-17 | Disposition: A | Discharge status: Home / Self Care | Payer: Medicare Other | Attending: Emergency Medicine | Admitting: Emergency Medicine

## 2022-06-17 ENCOUNTER — Emergency Department (HOSPITAL_COMMUNITY): Payer: Medicare Other

## 2022-06-17 DIAGNOSIS — E876 Hypokalemia: Secondary | ICD-10-CM | POA: Insufficient documentation

## 2022-06-17 DIAGNOSIS — W010XXA Fall on same level from slipping, tripping and stumbling without subsequent striking against object, initial encounter: Secondary | ICD-10-CM | POA: Insufficient documentation

## 2022-06-17 DIAGNOSIS — S0990XA Unspecified injury of head, initial encounter: Secondary | ICD-10-CM | POA: Diagnosis not present

## 2022-06-17 DIAGNOSIS — R059 Cough, unspecified: Secondary | ICD-10-CM | POA: Diagnosis present

## 2022-06-17 DIAGNOSIS — R739 Hyperglycemia, unspecified: Secondary | ICD-10-CM | POA: Diagnosis not present

## 2022-06-17 DIAGNOSIS — U071 COVID-19: Secondary | ICD-10-CM | POA: Diagnosis not present

## 2022-06-17 LAB — COMPREHENSIVE METABOLIC PANEL
ALT: 38 U/L (ref 0–44)
AST: 33 U/L (ref 15–41)
Albumin: 3.6 g/dL (ref 3.5–5.0)
Alkaline Phosphatase: 55 U/L (ref 38–126)
Anion gap: 11 (ref 5–15)
BUN: 17 mg/dL (ref 8–23)
CO2: 26 mmol/L (ref 22–32)
Calcium: 9.3 mg/dL (ref 8.9–10.3)
Chloride: 96 mmol/L — ABNORMAL LOW (ref 98–111)
Creatinine, Ser: 0.96 mg/dL (ref 0.61–1.24)
GFR, Estimated: >60 mL/min (ref >60)
Glucose, Bld: 213 mg/dL — ABNORMAL HIGH (ref 70–99)
Potassium: 3.2 mmol/L — ABNORMAL LOW (ref 3.5–5.1)
Sodium: 133 mmol/L — ABNORMAL LOW (ref 135–145)
Total Bilirubin: 0.6 mg/dL (ref 0.3–1.2)
Total Protein: 6.3 g/dL — ABNORMAL LOW (ref 6.5–8.1)

## 2022-06-17 LAB — URINALYSIS, ROUTINE W REFLEX MICROSCOPIC
Bacteria, UA: NONE SEEN
Bilirubin Urine: NEGATIVE
Glucose, UA: >=500 mg/dL — AB
Hgb urine dipstick: NEGATIVE
Ketones, ur: NEGATIVE mg/dL
Leukocytes,Ua: NEGATIVE
Nitrite: NEGATIVE
Protein, ur: NEGATIVE mg/dL
Specific Gravity, Urine: 1.026 (ref 1.005–1.030)
pH: 6 (ref 5.0–8.0)

## 2022-06-17 LAB — CBC
HCT: 45.5 % (ref 39.0–52.0)
Hemoglobin: 15.5 g/dL (ref 13.0–17.0)
MCH: 28.8 pg (ref 26.0–34.0)
MCHC: 34.1 g/dL (ref 30.0–36.0)
MCV: 84.6 fL (ref 80.0–100.0)
Platelets: 193 10*3/uL (ref 150–400)
RBC: 5.38 MIL/uL (ref 4.22–5.81)
RDW: 13.2 % (ref 11.5–15.5)
WBC: 8.4 10*3/uL (ref 4.0–10.5)
nRBC: 0 % (ref 0.0–0.2)

## 2022-06-17 LAB — RESP PANEL BY RT-PCR (RSV, FLU A&B, COVID)  RVPGX2
Influenza A by PCR: NEGATIVE
Influenza B by PCR: NEGATIVE
Resp Syncytial Virus by PCR: NEGATIVE
SARS Coronavirus 2 by RT PCR: POSITIVE — AB

## 2022-06-17 MED ORDER — ACETAMINOPHEN 500 MG PO TABS
1000.0000 mg | ORAL_TABLET | Freq: Once | ORAL | Status: AC
  Administered 2022-06-17: 1000 mg via ORAL
  Filled 2022-06-17: qty 2

## 2022-06-17 MED ORDER — MOLNUPIRAVIR EUA 200MG CAPSULE: 4.0000 | ORAL_CAPSULE | Freq: Two times a day (BID) | ORAL | 0 refills | Status: AC

## 2022-06-17 MED ORDER — POTASSIUM CHLORIDE CRYS ER 20 MEQ PO TBCR
60.0000 meq | EXTENDED_RELEASE_TABLET | Freq: Once | ORAL | Status: AC
  Administered 2022-06-17: 60 meq via ORAL
  Filled 2022-06-17: qty 3

## 2022-06-17 MED ORDER — POTASSIUM CHLORIDE CRYS ER 20 MEQ PO TBCR: EXTENDED_RELEASE_TABLET | ORAL | 20 refills | Status: DC

## 2022-06-17 MED ORDER — MAGNESIUM OXIDE -MG SUPPLEMENT 400 (240 MG) MG PO TABS
800.0000 mg | ORAL_TABLET | Freq: Once | ORAL | Status: AC
  Administered 2022-06-17: 800 mg via ORAL
  Filled 2022-06-17: qty 2

## 2022-06-17 NOTE — ED Provider Notes (Signed)
[facility] EMERGENCY DEPARTMENT Provider Note   CSN: [id number] Arrival date & time: 06/17/22  0818     History  Chief Complaint  Patient presents with   Alan Lin is a 66 y.o. male.  Pt presents via EMS s/p falls at home. Pt indicates problems w balance for past 1-2 years, whereby he is unsteady on feet. Hx falls. Has cane and walker. Pt notes recent non prod cough, congestion, body aches. No fever. No specific known ill contacts. Denies headache. No new neck/back pain. No new numbness/weakness or loss of normal functional ability. Does not feel he injured self in fall. No chest pain or sob. No abd pain or nvd. No gu c/o. No extremity pain/injury. No fever or chills.   The history is provided by the patient, medical records and the EMS personnel.  Fall Pertinent negatives include no chest pain, no abdominal pain, no headaches and no shortness of breath.       Home Medications Prior to Admission medications   Medication Sig Start Date End Date Taking? Authorizing Provider  amLODipine (NORVASC) 5 MG tablet Take 5 mg by mouth daily. 07/23/16   [provider]  chlorthalidone (HYGROTON) 25 MG tablet Take 25 mg by mouth daily. 07/07/16   [provider]  [employer] ASPIRIN CHILD 81 MG chewable tablet Chew 81 mg by mouth daily. 06/22/16   [provider]  cyclobenzaprine (FLEXERIL) 10 MG tablet Take 10 mg by mouth daily as needed for muscle spasms.    [provider]  diclofenac Sodium (VOLTAREN) 1 % GEL Apply 2 g topically 4 (four) times daily. 09/20/19   [provider]  Docusate Sodium (DSS) 100 MG CAPS docusate sodium 100 mg capsule  TAKE 1 CAPSULE BY MOUTH 2 TIMES DAILY.    [provider]  DULoxetine (CYMBALTA) 20 MG capsule duloxetine 20 mg capsule,delayed release  TAKE 2 CAPSULES BY MOUTH EVERY DAY 08/28/19   [provider]  gabapentin (NEURONTIN) 300 MG capsule Take 300 mg by mouth daily.  08/03/16   [provider]  glyBURIDE (DIABETA) 2.5 MG tablet Take 2.5 mg by mouth daily. 08/10/16   [provider]  HYDROcodone-acetaminophen (NORCO) 10-325 MG tablet hydrocodone 10 mg-acetaminophen 325 mg tablet  Take 1 tablet 4 times a day by oral route as needed.    [provider]  losartan (COZAAR) 50 MG tablet Take 50 mg by mouth daily. 07/14/16   [provider]  metoprolol succinate (TOPROL-XL) 25 MG 24 hr tablet metoprolol succinate ER 25 mg tablet,extended release 24 hr 10/15/19   [provider]  Multiple Vitamins-Minerals (MULTIVITAMIN WITH MINERALS) tablet Take 1 tablet by mouth daily.    [provider]  simvastatin (ZOCOR) 20 MG tablet Take 20 mg by mouth daily. 07/07/16   [provider]  zolpidem (AMBIEN) 5 MG tablet Take by mouth. 06/03/19   [provider]      Allergies    Metformin, Lisinopril, and Morphine and related    Review of Systems   Review of Systems  Constitutional:  Negative for fever.  HENT:  Positive for congestion. Negative for sore throat.   Eyes:  Negative for redness.  Respiratory:  Positive for cough. Negative for shortness of breath.   Cardiovascular:  Negative for chest pain, palpitations and leg swelling.  Gastrointestinal:  Negative for abdominal pain, blood in stool, diarrhea and vomiting.  Genitourinary:  Negative for dysuria and flank pain.  Musculoskeletal:  Negative for back pain and neck pain.  Skin:  Negative for rash.  Neurological:  Negative for weakness, numbness and headaches.  Hematological:  Does not bruise/bleed easily.  Psychiatric/Behavioral:  Negative for confusion.     Physical Exam Updated Vital Signs BP (!) 116/52 (BP Location: Right Arm)   Pulse 97   Temp 97.8 F (36.6 C) (Oral)   Resp 11   SpO2 96%  Physical Exam Vitals and nursing note reviewed.  Constitutional:      Appearance: Normal appearance. He is well-developed.  HENT:     Head:  Atraumatic.     Nose: Nose normal.     Mouth/Throat:     Mouth: Mucous membranes are moist.     Pharynx: Oropharynx is clear.  Eyes:     General: No scleral icterus.    Conjunctiva/sclera: Conjunctivae normal.     Pupils: Pupils are equal, round, and reactive to light.  Neck:     Vascular: No carotid bruit.     Trachea: No tracheal deviation.  Cardiovascular:     Rate and Rhythm: Normal rate and regular rhythm.     Pulses: Normal pulses.     Heart sounds: Normal heart sounds. No murmur heard.    No friction rub. No gallop.  Pulmonary:     Effort: Pulmonary effort is normal. No accessory muscle usage or respiratory distress.     Breath sounds: Normal breath sounds.  Chest:     Chest wall: No tenderness.  Abdominal:     General: Bowel sounds are normal. There is no distension.     Palpations: Abdomen is soft.     Tenderness: There is no abdominal tenderness. There is no guarding.  Genitourinary:    Comments: No cva tenderness. Musculoskeletal:        General: No swelling.     Cervical back: Normal range of motion and neck supple. No rigidity or tenderness.     Comments: CTLS spine, non tender, aligned, no step off. Good rom bil extremities without pain or focal bony tenderness.   Skin:    General: Skin is warm and dry.     Findings: No rash.  Neurological:     Mental Status: He is alert.     Comments: Alert, speech clear. GCS 15. Motor/sens grossly intact bil.   Psychiatric:        Mood and Affect: Mood normal.     ED Results / Procedures / Treatments   Labs (all labs ordered are listed, but only abnormal results are displayed) Results for orders placed or performed during the hospital encounter of 06/17/22  Resp panel by RT-PCR (RSV, Flu A&B, Covid) Anterior Nasal Swab   Specimen: Anterior Nasal Swab  Result Value Ref Range   SARS Coronavirus 2 by RT PCR POSITIVE (A) NEGATIVE   Influenza A by PCR NEGATIVE NEGATIVE   Influenza B by PCR NEGATIVE NEGATIVE   Resp  Syncytial Virus by PCR NEGATIVE NEGATIVE  CBC  Result Value Ref Range   WBC 8.4 4.0 - 10.5 K/uL   RBC 5.38 4.22 - 5.81 MIL/uL   Hemoglobin 15.5 13.0 - 17.0 g/dL   HCT 41.9 62.2 - 29.7 %   MCV 84.6 80.0 - 100.0 fL   MCH 28.8 26.0 - 34.0 pg   MCHC 34.1 30.0 - 36.0 g/dL   RDW 98.9 21.1 - 94.1 %   Platelets 193 150 - 400 K/uL   nRBC 0.0 0.0 - 0.2 %  Comprehensive metabolic panel  Result  Value Ref Range   Sodium 133 (L) 135 - 145 mmol/L   Potassium 3.2 (L) 3.5 - 5.1 mmol/L   Chloride 96 (L) 98 - 111 mmol/L   CO2 26 22 - 32 mmol/L   Glucose, Bld 213 (H) 70 - 99 mg/dL   BUN 17 8 - 23 mg/dL   Creatinine, Ser 1.610.96 0.61 - 1.24 mg/dL   Calcium 9.3 8.9 - 09.610.3 mg/dL   Total Protein 6.3 (L) 6.5 - 8.1 g/dL   Albumin 3.6 3.5 - 5.0 g/dL   AST 33 15 - 41 U/L   ALT 38 0 - 44 U/L   Alkaline Phosphatase 55 38 - 126 U/L   Total Bilirubin 0.6 0.3 - 1.2 mg/dL   GFR, Estimated >04>60 >54>60 mL/min   Anion gap 11 5 - 15   CT Angio Chest PE W/Cm &/Or Wo Cm  Result Date: 05/21/2022 CLINICAL DATA:  Frequent falls, suspected pulmonary embolism, blunt abdominal trauma, back pain, and right hip pain. EXAM: CT ANGIOGRAPHY CHEST CT ABDOMEN AND PELVIS WITH CONTRAST TECHNIQUE: Multidetector CT imaging of the chest was performed using the standard protocol during bolus administration of intravenous contrast. Multiplanar CT image reconstructions and MIPs were obtained to evaluate the vascular anatomy. Multidetector CT imaging of the abdomen and pelvis was performed using the standard protocol during bolus administration of intravenous contrast. RADIATION DOSE REDUCTION: This exam was performed according to the departmental dose-optimization program which includes automated exposure control, adjustment of the mA and/or kV according to patient size and/or use of iterative reconstruction technique. CONTRAST:  100mL OMNIPAQUE IOHEXOL 350 MG/ML SOLN COMPARISON:  CT abdomen pelvis with IV contrast 05/16/2017, portable chest today.  FINDINGS: CTA CHEST FINDINGS Cardiovascular: There is mild panchamber cardiomegaly with lipomatous infiltration of the interatrial septum, three-vessel patchy calcific CAD and no pericardial effusion. Pulmonary arteries are normal in caliber and do not show evidence of arterial emboli. The pulmonary veins are decompressed. There is no aortic aneurysm, stenosis or dissection. No flow-limiting great vessel stenosis is evident, with moderate patchy calcific plaques in the great vessels and moderate to heavy calcifications in the thoracic aorta. 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 Thyroid gland and axillary spaces are unremarkable. There is no intrathoracic adenopathy. Unremarkable thoracic esophagus and thoracic trachea and main bronchi. Lungs/Pleura: No infiltrate or nodule is evident. There is posterior atelectasis. There is a moderately elevated right hemidiaphragm consistent with eventration or paresis with overlying linear atelectasis. There is mild bronchial thickening. No bronchial impactions seen. 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 The ribcage is intact. No focal chest wall abnormality. Review of the MIP images confirms the above findings. CT ABDOMEN and PELVIS FINDINGS Hepatobiliary: Enlarged liver measuring 23 cm in length with moderate steatosis. Stable 1.5 cm cyst lateral dome of the liver is redemonstrated, Hounsfield density of 1.2. Rest of the parenchyma is homogeneous. Gallbladder and bile ducts are unremarkable. The main portal vein patent and mildly prominent, as before measuring 15 mm. Pancreas: No abnormality. Spleen: Normal in size with homogeneous enhancement. Adrenals/Urinary Tract: There is no adrenal mass. In the upper pole left kidney, again noted is a 1.6 cm homogeneous cyst, Hounsfield density of 15, and several scattered bilateral renal cortical hypodensities which are too small to characterize, some of which are new and  some of which were present on the prior study. There is no mass enhancement, and no follow-up imaging is recommended. There nonobstructive caliceal stones in the right upper pole measuring 3 mm  and 4 mm. No nephrolithiasis is seen on the left. There is no ureteral stone or hydronephrosis. The bladder thickness is normal. Stomach/Bowel: No dilatation or wall thickening including the appendix. There are scattered uncomplicated colonic diverticula, moderate fecal stasis in the ascending colon. Mild fecal stasis in the remainder. Vascular/Lymphatic: There is heavy aortoiliac calcific plaque without AAA. No adenopathy is seen. Reproductive: No prostatomegaly. Other: There are small umbilical and inguinal fat hernias. No incarcerated hernia. There is no free fluid, free hemorrhage or free air. Moderate subcutaneous stranding laterally at the left hip is noted but no space-occupying hematoma although the area of involvement is not fully seen. Musculoskeletal: There is no spinal compression fracture. No regional skeletal fracture is seen. Degenerative changes lumbar spine. Bridging osteophytes anterior right SI joint. Mild pelvic enthesopathy. Review of the MIP images confirms the above findings. IMPRESSION: 1. No evidence of pulmonary arterial emboli. 2. Cardiomegaly without evidence of CHF. 3. Aortic and coronary artery atherosclerosis. 4. Bronchitis without evidence of pneumonia. 5. Moderately elevated right hemidiaphragm consistent with eventration or paresis with overlying linear atelectasis. 6. Moderate subcutaneous stranding laterally at the left hip but no space-occupying hematoma. No other acute trauma related findings seen elsewhere. 7. Nonobstructive nephrolithiasis. 8. Constipation and diverticulosis. 9. Small umbilical and inguinal fat hernias. 10. Moderate hepatic steatosis with stable 1.5 cm cyst in the dome of the liver. 11. No regional skeletal fracture is seen. 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 Electronically Signed   By: Almira Bar M.D.   On: 05/21/2022 06:01   CT Abdomen Pelvis W Contrast  Result Date: 05/21/2022 CLINICAL DATA:  Frequent falls, suspected pulmonary embolism, blunt abdominal trauma, back pain, and right hip pain. EXAM: CT ANGIOGRAPHY CHEST CT ABDOMEN AND PELVIS WITH CONTRAST TECHNIQUE: Multidetector CT imaging of the chest was performed using the standard protocol during bolus administration of intravenous contrast. Multiplanar CT image reconstructions and MIPs were obtained to evaluate the vascular anatomy. Multidetector CT imaging of the abdomen and pelvis was performed using the standard protocol during bolus administration of intravenous contrast. RADIATION DOSE REDUCTION: This exam was performed according to the departmental dose-optimization program which includes automated exposure control, adjustment of the mA and/or kV according to patient size and/or use of iterative reconstruction technique. CONTRAST:  OMNIPAQUE IOHEXOL 350 MG/ML SOLN COMPARISON:  CT abdomen pelvis with IV contrast 05/16/2017, portable chest today. FINDINGS: CTA CHEST FINDINGS Cardiovascular: There is mild panchamber cardiomegaly with lipomatous infiltration of the interatrial septum, three-vessel patchy calcific CAD and no pericardial effusion. Pulmonary arteries are normal in caliber and do not show evidence of arterial emboli. The pulmonary veins are decompressed. There is no aortic aneurysm, stenosis or dissection. No flow-limiting great vessel stenosis is evident, with moderate patchy calcific plaques in the great vessels and moderate to heavy calcifications in the thoracic aorta. 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 Thyroid gland and axillary spaces are unremarkable. There is no intrathoracic adenopathy. Unremarkable thoracic esophagus and thoracic trachea and main bronchi. Lungs/Pleura: No infiltrate or nodule is evident. There is posterior  atelectasis. There is a moderately elevated right hemidiaphragm consistent with eventration or paresis with overlying linear atelectasis. There is mild bronchial thickening. No bronchial impactions seen. 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 The ribcage is intact. No focal chest wall abnormality. Review of the MIP images confirms the above findings. CT ABDOMEN and PELVIS FINDINGS Hepatobiliary: Enlarged liver measuring 23 cm in length with moderate steatosis. Stable 1.5 cm  cyst lateral dome of the liver is redemonstrated, Hounsfield density of 1.2. Rest of the parenchyma is homogeneous. Gallbladder and bile ducts are unremarkable. The main portal vein patent and mildly prominent, as before measuring 15 mm. Pancreas: No abnormality. Spleen: Normal in size with homogeneous enhancement. Adrenals/Urinary Tract: There is no adrenal mass. In the upper pole left kidney, again noted is a 1.6 cm homogeneous cyst, Hounsfield density of 15, and several scattered bilateral renal cortical hypodensities which are too small to characterize, some of which are new and some of which were present on the prior study. There is no mass enhancement, and no follow-up imaging is recommended. There nonobstructive caliceal stones in the right upper pole measuring 3 mm and 4 mm. No nephrolithiasis is seen on the left. There is no ureteral stone or hydronephrosis. The bladder thickness is normal. Stomach/Bowel: No dilatation or wall thickening including the appendix. There are scattered uncomplicated colonic diverticula, moderate fecal stasis in the ascending colon. Mild fecal stasis in the remainder. Vascular/Lymphatic: There is heavy aortoiliac calcific plaque without AAA. No adenopathy is seen. Reproductive: No prostatomegaly. Other: There are small umbilical and inguinal fat hernias. No incarcerated hernia. There is no free fluid, free hemorrhage or free air. Moderate subcutaneous stranding  laterally at the left hip is noted but no space-occupying hematoma although the area of involvement is not fully seen. Musculoskeletal: There is no spinal compression fracture. No regional skeletal fracture is seen. Degenerative changes lumbar spine. Bridging osteophytes anterior right SI joint. Mild pelvic enthesopathy. Review of the MIP images confirms the above findings. IMPRESSION: 1. No evidence of pulmonary arterial emboli. 2. Cardiomegaly without evidence of CHF. 3. Aortic and coronary artery atherosclerosis. 4. Bronchitis without evidence of pneumonia. 5. Moderately elevated right hemidiaphragm consistent with eventration or paresis with overlying linear atelectasis. 6. Moderate subcutaneous stranding laterally at the left hip but no space-occupying hematoma. No other acute trauma related findings seen elsewhere. 7. Nonobstructive nephrolithiasis. 8. Constipation and diverticulosis. 9. Small umbilical and inguinal fat hernias. 10. Moderate hepatic steatosis with stable 1.5 cm cyst in the dome of the liver. 11. No regional skeletal fracture is seen. 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 Electronically Signed   By: Almira Bar M.D.   On: 05/21/2022 06:01   CT Head Wo Contrast  Result Date: 05/21/2022 CLINICAL DATA:  Head trauma, minor (Age >= 65y) EXAM: CT HEAD WITHOUT CONTRAST TECHNIQUE: Contiguous axial images were obtained from the base of the skull through the vertex without intravenous contrast. RADIATION DOSE REDUCTION: This exam was performed according to the departmental dose-optimization program which includes automated exposure control, adjustment of the mA and/or kV according to patient size and/or use of iterative reconstruction technique. COMPARISON:  None Available. FINDINGS: Brain: There is periventricular white matter decreased attenuation consistent with small vessel ischemic changes. Gray-white differentiation is preserved. No acute intracranial hemorrhage,  mass effect or shift. No hydrocephalus. Vascular: No hyperdense vessel or unexpected calcification. Skull: Normal. Negative for fracture or focal lesion. Sinuses/Orbits: No acute finding. IMPRESSION: Periventricular white matter changes consistent with chronic small vessel ischemia. No acute intracranial process identified. Electronically Signed   By: Layla Maw M.D.   On: 05/21/2022 01:13   CT Cervical Spine Wo Contrast  Result Date: 05/21/2022 CLINICAL DATA:  Neck trauma (Age >= 65y) EXAM: CT CERVICAL SPINE WITHOUT CONTRAST TECHNIQUE: Multidetector CT imaging of the cervical spine was performed without intravenous contrast. Multiplanar CT image reconstructions were also generated. RADIATION DOSE REDUCTION: This exam was  performed according to the departmental dose-optimization program which includes automated exposure control, adjustment of the mA and/or kV according to patient size and/or use of iterative reconstruction technique. COMPARISON:  None Available. FINDINGS: Alignment: Normal. Skull base and vertebrae: No acute fracture. No primary bone lesion or focal pathologic process. Soft tissues and spinal canal: No prevertebral fluid or swelling. No visible canal hematoma. Disc levels: Patient is status post ACDF C4-C5. Disc space narrowing and marginal osteophytes identified at C5-6 through C7-T1. Upper chest: Negative. IMPRESSION: Degenerative and postsurgical changes. No acute traumatic abnormalities. Electronically Signed   By: Layla Maw M.D.   On: 05/21/2022 01:11   DG Chest Port 1 View  Result Date: 05/21/2022 CLINICAL DATA:  Fall EXAM: PORTABLE CHEST 1 VIEW COMPARISON:  None Available. FINDINGS: The heart size and mediastinal contours are within normal limits. Both lungs are clear. No pneumothorax or pleural effusion. Aorta is calcified. The visualized skeletal structures are unremarkable. IMPRESSION: No active disease. Electronically Signed   By: Layla Maw M.D.   On:  05/21/2022 01:01   DG Hip Unilat W or Wo Pelvis 2-3 Views Left  Result Date: 05/21/2022 CLINICAL DATA:  Fall EXAM: DG HIP (WITH OR WITHOUT PELVIS) 3V LEFT COMPARISON:  None Available. FINDINGS: Degenerative changes identified with sclerosis, joint space narrowing and osteophytes. No definite fracture, dislocation or subluxation. Pelvic ring appears intact. IMPRESSION: No acute osseous abnormalities identified. There are degenerative changes. Electronically Signed   By: Layla Maw M.D.   On: 05/21/2022 01:00     EKG EKG Interpretation  Date/Time:  Friday June 17 2022 08:31:12 EST Ventricular Rate:  100 PR Interval:  174 QRS Duration: 102 QT Interval:  323 QTC Calculation: 417 R Axis:   71 Text Interpretation: Sinus arrhythmia Borderline repolarization abnormality Non-specific ST-t changes Confirmed by Cathren Laine ([id number]) on 06/17/2022 10:23:19 AM  Radiology CT Head Wo Contrast  Result Date: 06/17/2022 CLINICAL DATA:  Fall EXAM: CT HEAD WITHOUT CONTRAST TECHNIQUE: Contiguous axial images were obtained from the base of the skull through the vertex without intravenous contrast. RADIATION DOSE REDUCTION: This exam was performed according to the departmental dose-optimization program which includes automated exposure control, adjustment of the mA and/or kV according to patient size and/or use of iterative reconstruction technique. COMPARISON:  Head CT 05/21/2022 FINDINGS: Brain: There is no acute intracranial hemorrhage, extra-axial fluid collection, or acute infarct. Parenchymal volume is stable. The ventricles are normal in size. Gray-white differentiation is preserved. There is no mass lesion.  There is no mass effect or midline shift. Vascular: No hyperdense vessel or unexpected calcification. Skull: Normal. Negative for fracture or focal lesion. Sinuses/Orbits: The paranasal sinuses are clear. The globes and orbits are unremarkable. Other: None. IMPRESSION: No acute intracranial  pathology. Electronically Signed   By: Lesia Hausen M.D.   On: 06/17/2022 09:37    Procedures Procedures    Medications Ordered in ED Medications - No data to display  ED Course/ Medical Decision Making/ A&P                           Medical Decision Making Problems Addressed: COVID-19 virus infection: acute illness or injury with systemic symptoms that poses a threat to life or bodily functions Fall from slip, trip, or stumble, initial encounter: acute illness or injury with systemic symptoms that poses a threat to life or bodily functions Hyperglycemia: acute illness or injury Hypokalemia: acute illness or injury  Amount and/or Complexity of Data Reviewed  Independent Historian: EMS    Details: hx External Data Reviewed: notes. Labs: ordered. Decision-making details documented in ED Course. Radiology: ordered. Decision-making details documented in ED Course. ECG/medicine tests: ordered and independent interpretation performed. Decision-making details documented in ED Course.  Risk OTC drugs. Prescription drug management. Decision regarding hospitalization.   Iv ns. Continuous pulse ox and cardiac monitoring. Labs ordered/sent. Imaging ordered.   Diff dx includes head injury, sdh, covid/flu/uti, dehydration, etc - dispo decision including potential need for admission considered - will get labs and imaging and reassess.   Reviewed nursing notes and prior charts for additional history. External reports reviewed. Additional history from: EMS.   Cardiac monitor: sinus rhythm, rate 90.  Labs reviewed/interpreted by me - covid pos.   CT reviewed/interpreted by me - no hem.   Pt has been vaccinated and boosted for covid. Pt agreeable to rx for same.  Is breathing comfortably, room air sats 95%.   Pt currently appears stable for d/c.   Rec pcp f/u. Fall precautions, walker. Will also make home health referral for home PT/additional support.  Return precautions provided.                Final Clinical Impression(s) / ED Diagnoses Final diagnoses:  None    Rx / DC Orders ED Discharge Orders     None         Cathren Laine, MD 06/17/22 1141

## 2022-06-17 NOTE — Discharge Instructions (Addendum)
It was our pleasure to provide your ER care today - we hope that you feel better.  Your covid test is positive. Drink adequate fluids/stay well hydrated. Stay active, take full and deep breaths. Take molnupiravir as prescribed.   Fall precautions. Use great care/caution, and walker, to help minimize risk of falling.   Follow up with primary  care doctor in 1-2 weeks if symptoms fail to improve/resolve.  We also made a home health referral - they should be contacting you soon.  From today's labs, your potassium level is low (3.2) - eat plenty of fruits and vegetables, take potassium supplement as prescribed, and follow up with your doctor in 1-2 weeks.   Return to ER if worse, new symptoms, new or severe pain, increased trouble breathing, fainting, or other concern.

## 2022-06-17 NOTE — ED Triage Notes (Signed)
Patient bib [facility] from home with complaints of multiple falls within the past 2 hours. Denies LOC or hitting his head. No dizziness. Cough x2 days. Headaches. VSS

## 2022-07-04 ENCOUNTER — Ambulatory Visit: Payer: BC Managed Care – PPO | Admitting: Neurology

## 2022-07-05 ENCOUNTER — Ambulatory Visit (INDEPENDENT_AMBULATORY_CARE_PROVIDER_SITE_OTHER): Payer: Medicare Other | Admitting: Neurology

## 2022-07-05 ENCOUNTER — Other Ambulatory Visit: Payer: Medicare Other

## 2022-07-05 ENCOUNTER — Encounter: Payer: Self-pay | Admitting: Neurology

## 2022-07-05 ENCOUNTER — Other Ambulatory Visit: Payer: Self-pay | Admitting: Neurology

## 2022-07-05 VITALS — BP 145/84 | HR 94 | Ht 70.0 in | Wt 260.0 lb

## 2022-07-05 DIAGNOSIS — M5416 Radiculopathy, lumbar region: Secondary | ICD-10-CM

## 2022-07-05 DIAGNOSIS — Z9181 History of falling: Secondary | ICD-10-CM | POA: Diagnosis not present

## 2022-07-05 DIAGNOSIS — G629 Polyneuropathy, unspecified: Secondary | ICD-10-CM | POA: Diagnosis not present

## 2022-07-05 NOTE — Progress Notes (Signed)
AS a FYI: may see duplicate orders on this patient as we were trying to figure out why the requisitions were not printing on label paper as they should.  Gus Rankin

## 2022-07-05 NOTE — Progress Notes (Signed)
Alan Lin - Initial Visit   Date: 07/05/2022   Alan Lin MRN: [id number] DOB: [birth year]   Dear Dr. Malka So:  Thank you for your kind referral of Alan Lin for consultation of falls. Although his history is well known to you, please allow [place] to reiterate it for the purpose of our medical record. The patient was accompanied to the clinic by self.     Alan Lin is a 67 y.o. right-handed male with hypertension, well-controlled diabetes mellitus, OSA, cervical spondylosis s/p ACDF at C4-5 presenting for evaluation of falls.   IMPRESSION/PLAN: Multifactorial unsteady gait due to overlapping lumbosacral radiculopathy and neuropathy.  MRI lumbar spine shows bilateral neural foraminal stenosis at L5-S1 and his exam is suggestive of neuropathy.  To better characterize the nature of his symptoms, he will return for NCS/EMG of the legs.  He has has mild bradykinesia in the feet and gait difficulty without other parkinsonian symptoms, but I will continue to monitor for any changes on his exam.  - Check vitamin B12, folate, copper, SPEP with IFE  - Continue PT  Further recommendations pending results.  ------------------------------------------------------------- History of present illness: Over the past 1-2 years, he has been having greater difficulty with balance and has several falls.  He fell at an outdoor family reunion in the summer and tripped while trying to take trash out to the street, which resulted in large hematoma of the leg.  He tends to fall forwards without warning.  He endorses unsteadiness and uses a cane and rollator.  He has tried PT which helps.  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  Around the same time, he underwent cervical decompression and fusion at C4-5 at [facility].  He is followed by spine surgeon who also ordered MRI lumbar spine which shows multilevel degenerative changes with  severe bilateral neural foraminal stenosis at L5-S1.  He has low back pain and gets injections.  He denies weakness in the legs.  He does have numbness and tingling in the toes.   Out-side paper records, electronic medical record, and images have been reviewed where available and summarized as:  MRI cervical spine wo contrast 03/06/2022: -- C4-C5 ACDF with prominent loss of vertebral body height of C6 with associated bone marrow edema, which is likely degenerative. The degree of vertebral body height loss appears overall to that of previous cervical spine radiographs in 2021.   -- Multilevel cervical spondylosis, most notably including neural foraminal narrowing on the left at C5-C6 and bilaterally at C6-C7, with additional levels of moderate throughout the cervical spine, as above.   MRI lumbar spine wo contrast 02/14/2021: Multilevel lumbar spondylosis, worse at L5/S1 where there is severe bilateral neural foraminal stenosis.   No results found for: "HGBA1C"  Past Medical History:  Diagnosis Date   Arthritis    Diabetes mellitus without complication ([place])    Hypertension    Sleep apnea     History reviewed. No pertinent surgical history.   Medications:  Outpatient Encounter Medications as of 07/05/2022  Medication Sig   acetaminophen (TYLENOL) 500 MG tablet Take 500 mg by mouth every 6 (six) hours as needed. Takes two 500mg  at bedtime   amLODipine (NORVASC) 5 MG tablet Take 5 mg by mouth daily.   chlorthalidone (HYGROTON) 25 MG tablet Take 25 mg by mouth daily.   cyclobenzaprine (FLEXERIL) 10 MG tablet Take 10 mg by mouth daily as needed for muscle spasms.  diclofenac Sodium (VOLTAREN) 1 % GEL Apply 2 g topically 4 (four) times daily.   DULoxetine (CYMBALTA) 20 MG capsule duloxetine 20 mg capsule,delayed release  TAKE 2 CAPSULES BY MOUTH EVERY DAY   gabapentin (NEURONTIN) 300 MG capsule Take 300 mg by mouth daily. One capsule in the morning 5 capsules in the evening   losartan  (COZAAR) 50 MG tablet Take 50 mg by mouth daily.   metoprolol succinate (TOPROL-XL) 25 MG 24 hr tablet metoprolol succinate ER 25 mg tablet,extended release 24 hr   Multiple Vitamins-Minerals (MULTIVITAMIN WITH MINERALS) tablet Take 1 tablet by mouth daily.   potassium chloride SA (KLOR-CON M) 20 MEQ tablet One po bid x 3 days, then one po once a day   simvastatin (ZOCOR) 20 MG tablet Take 20 mg by mouth daily.   [DISCONTINUED] [facility] ASPIRIN CHILD 81 MG chewable tablet Chew 81 mg by mouth daily. (Patient not taking: Reported on 07/05/2022)   [DISCONTINUED] Docusate Sodium (DSS) 100 MG CAPS docusate sodium 100 mg capsule  TAKE 1 CAPSULE BY MOUTH 2 TIMES DAILY. (Patient not taking: Reported on 07/05/2022)   [DISCONTINUED] glyBURIDE (DIABETA) 2.5 MG tablet Take 2.5 mg by mouth daily. (Patient not taking: Reported on 07/05/2022)   [DISCONTINUED] HYDROcodone-acetaminophen (NORCO) 10-325 MG tablet hydrocodone 10 mg-acetaminophen 325 mg tablet  Take 1 tablet 4 times a day by oral route as needed. (Patient not taking: Reported on 07/05/2022)   [DISCONTINUED] zolpidem (AMBIEN) 5 MG tablet Take by mouth. (Patient not taking: Reported on 07/05/2022)   No facility-administered encounter medications on file as of 07/05/2022.    Allergies:  Allergies  Allergen Reactions   Metformin Diarrhea and Nausea And Vomiting   Lisinopril Cough   Morphine And Related Itching    Family History: Family History  Problem Relation Age of Onset   Dementia Mother    Alzheimer's disease Mother    Kidney Stones Father     Social History: Social History   Tobacco Use   Smoking status: Never   Smokeless tobacco: Never  Substance Use Topics   Alcohol use: Never   Drug use: Never   Social History   Social History Narrative   Are you right handed or left handed? Right Handed    Are you currently employed ? No    What is your current occupation? Retired    Do you live at home alone? yes   Who lives with you? No one     What type of home do you live in: 1 story or 2 story? One story home        Vital Signs:  BP (!) 145/84   Pulse 94   Ht 5\' 10"  (1.778 m)   Wt 260 lb (117.9 kg)   SpO2 96%   BMI 37.31 kg/m    Neurological Exam: MENTAL STATUS including orientation to time, place, person, recent and remote memory, attention span and concentration, language, and fund of knowledge is normal.  Speech is not dysarthric.  CRANIAL NERVES: II:  No visual field defects.     III-IV-VI: Pupils equal round and reactive to light.  Normal conjugate, extra-ocular eye movements in all directions of gaze.  No nystagmus.  No ptosis.   V:  Normal facial sensation.    VII:  Normal facial symmetry and movements.   VIII:  Normal hearing and vestibular function.   IX-X:  Normal palatal movement.   XI:  Normal shoulder shrug and head rotation.   XII:  Normal tongue strength  and range of motion, no deviation or fasciculation.  MOTOR:  No atrophy, fasciculations or abnormal movements.  No pronator drift.   Upper Extremity:  Right  Left  Deltoid  5/5   5/5   Biceps  5/5   5/5   Triceps  5/5   5/5   Wrist extensors  5/5   5/5   Wrist flexors  5/5   5/5   Finger extensors  5/5   5/5   Finger flexors  5/5   5/5   Dorsal interossei  5/5   5/5   Abductor pollicis  5/5   5/5   Tone (Ashworth scale)  0  0   Lower Extremity:  Right  Left  Hip flexors  5/5   5/5   Hip extensors  5/5   5/5   Adductor 5/5  5/5  Abductor 5/5  5/5  Knee flexors  5/5   5/5   Knee extensors  5/5   5/5   Dorsiflexors  5/5   5/5   Plantarflexors  5/5   5/5   Toe extensors  5-/5   5-/5   Toe flexors  5-/5   5-/5   Tone (Ashworth scale)  0  0   MSRs:                                           Right        Left brachioradialis 2+  2+  biceps 2+  2+  triceps 2+  2+  patellar 2+  2+  ankle jerk 0  0  Hoffman no  no  plantar response down  down   SENSORY:  Reduced vibration, temperature, and pin prick below the ankles.  Sensation is  normal in the hands.  Rhomberg testing is mildly abnormal.   COORDINATION/GAIT: Normal finger-to- nose-finger.  Finger tapping intact.  Toe tapping is mildly reduced in amplitude and rate.  Gait appears wide-based, small step, assisted with cane.    Thank you for allowing me to participate in patient's care.  If I can answer any additional questions, I would be pleased to do so.    Sincerely,    Aundraya Dripps K. Allena Katz, DO

## 2022-07-06 LAB — B12 AND FOLATE PANEL
Folate: 6.9 ng/mL (ref >5.9)
Vitamin B-12: 352 pg/mL (ref 211–911)

## 2022-07-12 LAB — IMMUNOFIXATION ELECTROPHORESIS
IgG (Immunoglobin G), Serum: 473 mg/dL — ABNORMAL LOW (ref 600–1540)
IgM, Serum: 60 mg/dL (ref 50–300)
Immunoglobulin A: 149 mg/dL (ref 70–320)

## 2022-07-12 LAB — PROTEIN ELECTROPHORESIS, SERUM
Albumin ELP: 3.8 g/dL (ref 3.8–4.8)
Alpha 1: 0.3 g/dL (ref 0.2–0.3)
Alpha 2: 0.8 g/dL (ref 0.5–0.9)
Beta 2: 0.3 g/dL (ref 0.2–0.5)
Beta Globulin: 0.5 g/dL (ref 0.4–0.6)
Gamma Globulin: 0.5 g/dL — ABNORMAL LOW (ref 0.8–1.7)
Total Protein: 6.1 g/dL (ref 6.1–8.1)

## 2022-07-12 LAB — COPPER, SERUM: Copper: 110 ug/dL (ref 70–175)

## 2022-07-14 ENCOUNTER — Ambulatory Visit (INDEPENDENT_AMBULATORY_CARE_PROVIDER_SITE_OTHER): Payer: Medicare Other | Admitting: Neurology

## 2022-07-14 DIAGNOSIS — M5416 Radiculopathy, lumbar region: Secondary | ICD-10-CM

## 2022-07-14 DIAGNOSIS — G629 Polyneuropathy, unspecified: Secondary | ICD-10-CM

## 2022-07-14 DIAGNOSIS — Z9181 History of falling: Secondary | ICD-10-CM

## 2022-07-14 NOTE — Procedures (Signed)
[facility]  [place], [place]  [place], [place] [address] Tel: [phone] Fax: [phone] Test Date:  07/14/2022  Patient: Alan Lin DOB: 1955-10-18 Physician: Narda Amber, DO  Sex: Male Height: 5\' 10"  Ref Phys: Narda Amber, DO  ID#: [id number]   Technician:    History: This is a 67 year old man referred for evaluation of gait instability.  NCV & EMG Findings: Extensive electrodiagnostic testing of the right lower extremity and additional studies of the left shows:  Bilateral sural and superficial peroneal sensory responses are absent. Bilateral peroneal and tibial motor responses are within normal limits. Chronic motor axon loss changes are seen affecting the L5-S1 myotomes bilaterally, without accompanying active denervation.  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 There is also a superimposed chronic L5-S1 radiculopathy affecting bilateral lower extremities.   ___________________________ Narda Amber, DO    Nerve Conduction Studies   Stim Site NR Peak (ms) Norm Peak (ms) O-P Amp (V) Norm O-P Amp  Left Sup Peroneal Anti Sensory (Ant Lat Mall)  32 C  12 cm *NR  <4.6  >3  Right Sup Peroneal Anti Sensory (Ant Lat Mall)  32 C  12 cm *NR  <4.6  >3  Left Sural Anti Sensory (Lat Mall)  32 C  Calf *NR  <4.6  >3  Right Sural Anti Sensory (Lat Mall)  32 C  Calf *NR  <4.6  >3     Stim Site NR Onset (ms) Norm Onset (ms) O-P Amp (mV) Norm O-P Amp Site1 Site2 Delta-0 (ms) Dist (cm) Vel (m/s) Norm Vel (m/s)  Left Peroneal Motor (Ext Dig Brev)  32 C  Ankle    4.3 <6.0 3.1 >2.5 B Fib Ankle 7.8 35.0 45 >40  B Fib    12.1  2.9  Poplt B Fib 2.0 8.0 40 >40  Poplt    14.1  2.6         Right Peroneal Motor (Ext Dig Brev)  32 C  Ankle    4.1 <6.0 3.3 >2.5 B Fib Ankle 8.1 36.0 44 >40  B Fib    12.2  2.5  Poplt B Fib 1.9 8.0 42 >40  Poplt     14.1  2.2         Left Tibial Motor (Abd Hall Brev)  32 C  Ankle    4.2 <6.0 7.7 >4 Knee Ankle 8.6 42.0 49 >40  Knee    12.8  7.7         Right Tibial Motor (Abd Hall Brev)  32 C  Ankle    4.6 <6.0 4.6 >4 Knee Ankle 9.2 42.0 46 >40  Knee    13.8  3.6          Electromyography   Side Muscle Ins.Act Fibs Fasc Recrt Amp Dur Poly Activation Comment  Right AntTibialis Nml Nml Nml *1- *1+ *1+ *1+ Nml N/A  Right Gastroc Nml Nml Nml *1- *1+ *1+ *1+ Nml N/A  Right Flex Dig Long Nml Nml Nml *2- *1+ *1+ *1+ Nml N/A  Right RectFemoris Nml Nml Nml Nml Nml Nml Nml Nml N/A  Right BicepsFemS Nml Nml Nml *1- *1+ *1+ *1+ Nml N/A  Right GluteusMed Nml Nml Nml Nml Nml Nml Nml Nml N/A  Left Flex Dig Long Nml Nml Nml *1- *1+ *1+ *1+ Nml N/A  Left Gastroc Nml Nml Nml *1- *1+ *1+ *1+ Nml N/A  Left AntTibialis Nml  Nml Nml *1- *1+ *1+ *1+ Nml N/A  Left RectFemoris Nml Nml Nml Nml Nml Nml Nml Nml N/A      Waveforms:

## 2022-07-21 ENCOUNTER — Inpatient Hospital Stay (HOSPITAL_COMMUNITY)
Admission: EM | Admit: 2022-07-21 | Discharge: 2022-07-28 | DRG: 640 | Disposition: A | Discharge status: Skilled Nursing Facility | Payer: Medicare Other | Attending: Internal Medicine | Admitting: Internal Medicine

## 2022-07-21 ENCOUNTER — Observation Stay (HOSPITAL_COMMUNITY): Payer: Medicare Other

## 2022-07-21 ENCOUNTER — Encounter (HOSPITAL_COMMUNITY): Payer: Self-pay

## 2022-07-21 ENCOUNTER — Emergency Department (HOSPITAL_COMMUNITY): Payer: Medicare Other

## 2022-07-21 ENCOUNTER — Other Ambulatory Visit: Payer: Self-pay

## 2022-07-21 DIAGNOSIS — Y92009 Unspecified place in unspecified non-institutional (private) residence as the place of occurrence of the external cause: Secondary | ICD-10-CM

## 2022-07-21 DIAGNOSIS — I493 Ventricular premature depolarization: Secondary | ICD-10-CM | POA: Diagnosis present

## 2022-07-21 DIAGNOSIS — R55 Syncope and collapse: Secondary | ICD-10-CM | POA: Diagnosis not present

## 2022-07-21 DIAGNOSIS — M5417 Radiculopathy, lumbosacral region: Secondary | ICD-10-CM | POA: Diagnosis present

## 2022-07-21 DIAGNOSIS — M1990 Unspecified osteoarthritis, unspecified site: Secondary | ICD-10-CM | POA: Diagnosis present

## 2022-07-21 DIAGNOSIS — M542 Cervicalgia: Secondary | ICD-10-CM | POA: Diagnosis present

## 2022-07-21 DIAGNOSIS — G4733 Obstructive sleep apnea (adult) (pediatric): Secondary | ICD-10-CM | POA: Diagnosis present

## 2022-07-21 DIAGNOSIS — Z1152 Encounter for screening for COVID-19: Secondary | ICD-10-CM

## 2022-07-21 DIAGNOSIS — M4807 Spinal stenosis, lumbosacral region: Secondary | ICD-10-CM | POA: Diagnosis present

## 2022-07-21 DIAGNOSIS — S2232XA Fracture of one rib, left side, initial encounter for closed fracture: Secondary | ICD-10-CM | POA: Diagnosis not present

## 2022-07-21 DIAGNOSIS — E871 Hypo-osmolality and hyponatremia: Secondary | ICD-10-CM | POA: Diagnosis not present

## 2022-07-21 DIAGNOSIS — W19XXXA Unspecified fall, initial encounter: Secondary | ICD-10-CM | POA: Diagnosis present

## 2022-07-21 DIAGNOSIS — Z6837 Body mass index (BMI) 37.0-37.9, adult: Secondary | ICD-10-CM

## 2022-07-21 DIAGNOSIS — Z86010 Personal history of colonic polyps: Secondary | ICD-10-CM

## 2022-07-21 DIAGNOSIS — M48061 Spinal stenosis, lumbar region without neurogenic claudication: Secondary | ICD-10-CM | POA: Diagnosis present

## 2022-07-21 DIAGNOSIS — Z794 Long term (current) use of insulin: Secondary | ICD-10-CM

## 2022-07-21 DIAGNOSIS — I951 Orthostatic hypotension: Secondary | ICD-10-CM | POA: Diagnosis present

## 2022-07-21 DIAGNOSIS — E669 Obesity, unspecified: Secondary | ICD-10-CM | POA: Diagnosis present

## 2022-07-21 DIAGNOSIS — E1140 Type 2 diabetes mellitus with diabetic neuropathy, unspecified: Secondary | ICD-10-CM | POA: Diagnosis present

## 2022-07-21 DIAGNOSIS — E876 Hypokalemia: Secondary | ICD-10-CM | POA: Diagnosis present

## 2022-07-21 DIAGNOSIS — S2242XA Multiple fractures of ribs, left side, initial encounter for closed fracture: Secondary | ICD-10-CM | POA: Diagnosis present

## 2022-07-21 DIAGNOSIS — Z888 Allergy status to other drugs, medicaments and biological substances status: Secondary | ICD-10-CM

## 2022-07-21 DIAGNOSIS — E119 Type 2 diabetes mellitus without complications: Secondary | ICD-10-CM

## 2022-07-21 DIAGNOSIS — E785 Hyperlipidemia, unspecified: Secondary | ICD-10-CM | POA: Diagnosis present

## 2022-07-21 DIAGNOSIS — K209 Esophagitis, unspecified without bleeding: Secondary | ICD-10-CM

## 2022-07-21 DIAGNOSIS — I480 Paroxysmal atrial fibrillation: Secondary | ICD-10-CM | POA: Diagnosis present

## 2022-07-21 DIAGNOSIS — G8929 Other chronic pain: Secondary | ICD-10-CM | POA: Diagnosis present

## 2022-07-21 DIAGNOSIS — R296 Repeated falls: Secondary | ICD-10-CM | POA: Diagnosis present

## 2022-07-21 DIAGNOSIS — Z885 Allergy status to narcotic agent status: Secondary | ICD-10-CM

## 2022-07-21 DIAGNOSIS — Z9181 History of falling: Secondary | ICD-10-CM

## 2022-07-21 DIAGNOSIS — S2239XA Fracture of one rib, unspecified side, initial encounter for closed fracture: Secondary | ICD-10-CM | POA: Diagnosis present

## 2022-07-21 DIAGNOSIS — I10 Essential (primary) hypertension: Secondary | ICD-10-CM | POA: Diagnosis not present

## 2022-07-21 DIAGNOSIS — E860 Dehydration: Principal | ICD-10-CM | POA: Diagnosis present

## 2022-07-21 DIAGNOSIS — K760 Fatty (change of) liver, not elsewhere classified: Secondary | ICD-10-CM | POA: Diagnosis present

## 2022-07-21 DIAGNOSIS — E1165 Type 2 diabetes mellitus with hyperglycemia: Secondary | ICD-10-CM

## 2022-07-21 DIAGNOSIS — Z79899 Other long term (current) drug therapy: Secondary | ICD-10-CM

## 2022-07-21 DIAGNOSIS — A059 Bacterial foodborne intoxication, unspecified: Secondary | ICD-10-CM | POA: Diagnosis not present

## 2022-07-21 DIAGNOSIS — Z7901 Long term (current) use of anticoagulants: Secondary | ICD-10-CM

## 2022-07-21 DIAGNOSIS — N200 Calculus of kidney: Secondary | ICD-10-CM | POA: Diagnosis present

## 2022-07-21 DIAGNOSIS — M5416 Radiculopathy, lumbar region: Secondary | ICD-10-CM | POA: Diagnosis present

## 2022-07-21 DIAGNOSIS — Z87442 Personal history of urinary calculi: Secondary | ICD-10-CM

## 2022-07-21 DIAGNOSIS — Z820 Family history of epilepsy and other diseases of the nervous system: Secondary | ICD-10-CM

## 2022-07-21 DIAGNOSIS — S0083XA Contusion of other part of head, initial encounter: Secondary | ICD-10-CM | POA: Diagnosis present

## 2022-07-21 DIAGNOSIS — G9341 Metabolic encephalopathy: Secondary | ICD-10-CM | POA: Diagnosis not present

## 2022-07-21 DIAGNOSIS — Z9989 Dependence on other enabling machines and devices: Secondary | ICD-10-CM

## 2022-07-21 DIAGNOSIS — E782 Mixed hyperlipidemia: Secondary | ICD-10-CM

## 2022-07-21 LAB — RESP PANEL BY RT-PCR (RSV, FLU A&B, COVID)  RVPGX2
Influenza A by PCR: NEGATIVE
Influenza B by PCR: NEGATIVE
Resp Syncytial Virus by PCR: NEGATIVE
SARS Coronavirus 2 by RT PCR: NEGATIVE

## 2022-07-21 LAB — URINALYSIS, ROUTINE W REFLEX MICROSCOPIC
Bacteria, UA: NONE SEEN
Bilirubin Urine: NEGATIVE
Glucose, UA: >=500 mg/dL — AB
Hgb urine dipstick: NEGATIVE
Ketones, ur: 5 mg/dL — AB
Leukocytes,Ua: NEGATIVE
Nitrite: NEGATIVE
Protein, ur: NEGATIVE mg/dL
Specific Gravity, Urine: 1.023 (ref 1.005–1.030)
pH: 6 (ref 5.0–8.0)

## 2022-07-21 LAB — PHOSPHORUS: Phosphorus: 1.4 mg/dL — ABNORMAL LOW (ref 2.5–4.6)

## 2022-07-21 LAB — CBC WITH DIFFERENTIAL/PLATELET
Abs Immature Granulocytes: 0.12 10*3/uL — ABNORMAL HIGH (ref 0.00–0.07)
Basophils Absolute: 0.1 10*3/uL (ref 0.0–0.1)
Basophils Relative: 0 %
Eosinophils Absolute: 0 10*3/uL (ref 0.0–0.5)
Eosinophils Relative: 0 %
HCT: 37.9 % — ABNORMAL LOW (ref 39.0–52.0)
Hemoglobin: 13.1 g/dL (ref 13.0–17.0)
Immature Granulocytes: 1 %
Lymphocytes Relative: 8 %
Lymphs Abs: 0.9 10*3/uL (ref 0.7–4.0)
MCH: 29 pg (ref 26.0–34.0)
MCHC: 34.6 g/dL (ref 30.0–36.0)
MCV: 83.8 fL (ref 80.0–100.0)
Monocytes Absolute: 0.6 10*3/uL (ref 0.1–1.0)
Monocytes Relative: 6 %
Neutro Abs: 9.9 10*3/uL — ABNORMAL HIGH (ref 1.7–7.7)
Neutrophils Relative %: 85 %
Platelets: 217 10*3/uL (ref 150–400)
RBC: 4.52 MIL/uL (ref 4.22–5.81)
RDW: 13.2 % (ref 11.5–15.5)
WBC: 11.7 10*3/uL — ABNORMAL HIGH (ref 4.0–10.5)
nRBC: 0 % (ref 0.0–0.2)

## 2022-07-21 LAB — COMPREHENSIVE METABOLIC PANEL
ALT: 37 U/L (ref 0–44)
AST: 46 U/L — ABNORMAL HIGH (ref 15–41)
Albumin: 3.1 g/dL — ABNORMAL LOW (ref 3.5–5.0)
Alkaline Phosphatase: 45 U/L (ref 38–126)
Anion gap: 13 (ref 5–15)
BUN: 24 mg/dL — ABNORMAL HIGH (ref 8–23)
CO2: 19 mmol/L — ABNORMAL LOW (ref 22–32)
Calcium: 8.4 mg/dL — ABNORMAL LOW (ref 8.9–10.3)
Chloride: 102 mmol/L (ref 98–111)
Creatinine, Ser: 0.82 mg/dL (ref 0.61–1.24)
GFR, Estimated: >60 mL/min (ref >60)
Glucose, Bld: 280 mg/dL — ABNORMAL HIGH (ref 70–99)
Potassium: 2.7 mmol/L — CL (ref 3.5–5.1)
Sodium: 134 mmol/L — ABNORMAL LOW (ref 135–145)
Total Bilirubin: 1 mg/dL (ref 0.3–1.2)
Total Protein: 5.3 g/dL — ABNORMAL LOW (ref 6.5–8.1)

## 2022-07-21 LAB — LACTIC ACID, PLASMA
Lactic Acid, Venous: 1.7 mmol/L (ref 0.5–1.9)
Lactic Acid, Venous: 2.2 mmol/L (ref 0.5–1.9)

## 2022-07-21 LAB — MAGNESIUM: Magnesium: 1.6 mg/dL — ABNORMAL LOW (ref 1.7–2.4)

## 2022-07-21 LAB — CK: Total CK: 49 U/L (ref 49–397)

## 2022-07-21 LAB — D-DIMER, QUANTITATIVE: D-Dimer, Quant: 1.43 ug/mL-FEU — ABNORMAL HIGH (ref 0.00–0.50)

## 2022-07-21 LAB — HEMOGLOBIN A1C
Hgb A1c MFr Bld: 8 % — ABNORMAL HIGH (ref 4.8–5.6)
Mean Plasma Glucose: 182.9 mg/dL

## 2022-07-21 LAB — PROTIME-INR
INR: 1.2 (ref 0.8–1.2)
Prothrombin Time: 14.8 seconds (ref 11.4–15.2)

## 2022-07-21 LAB — CBG MONITORING, ED: Glucose-Capillary: 239 mg/dL — ABNORMAL HIGH (ref 70–99)

## 2022-07-21 LAB — TSH: TSH: 0.453 u[IU]/mL (ref 0.350–4.500)

## 2022-07-21 LAB — TROPONIN I (HIGH SENSITIVITY)
Troponin I (High Sensitivity): 10 ng/L (ref <18)
Troponin I (High Sensitivity): 4 ng/L (ref <18)

## 2022-07-21 MED ORDER — ACETAMINOPHEN 325 MG PO TABS
650.0000 mg | ORAL_TABLET | Freq: Four times a day (QID) | ORAL | Status: DC | PRN
  Administered 2022-07-21: 650 mg via ORAL
  Filled 2022-07-21: qty 2

## 2022-07-21 MED ORDER — DULOXETINE HCL 20 MG PO CPEP
40.0000 mg | ORAL_CAPSULE | Freq: Every day | ORAL | Status: DC
  Administered 2022-07-22 – 2022-07-28 (×7): 40 mg via ORAL
  Filled 2022-07-21 (×7): qty 2

## 2022-07-21 MED ORDER — SIMVASTATIN 20 MG PO TABS
20.0000 mg | ORAL_TABLET | Freq: Every day | ORAL | Status: DC
  Administered 2022-07-22 – 2022-07-28 (×7): 20 mg via ORAL
  Filled 2022-07-21 (×7): qty 1

## 2022-07-21 MED ORDER — IOHEXOL 350 MG/ML SOLN
75.0000 mL | Freq: Once | INTRAVENOUS | Status: AC | PRN
  Administered 2022-07-21: 75 mL via INTRAVENOUS

## 2022-07-21 MED ORDER — TRAMADOL HCL 50 MG PO TABS
50.0000 mg | ORAL_TABLET | Freq: Four times a day (QID) | ORAL | Status: DC | PRN
  Administered 2022-07-22 – 2022-07-27 (×9): 50 mg via ORAL
  Filled 2022-07-21 (×10): qty 1

## 2022-07-21 MED ORDER — INSULIN ASPART 100 UNIT/ML IJ SOLN
0.0000 [IU] | INTRAMUSCULAR | Status: DC
  Administered 2022-07-22: 2 [IU] via SUBCUTANEOUS
  Administered 2022-07-22: 3 [IU] via SUBCUTANEOUS
  Administered 2022-07-22: 5 [IU] via SUBCUTANEOUS
  Administered 2022-07-22: 2 [IU] via SUBCUTANEOUS
  Administered 2022-07-22: 5 [IU] via SUBCUTANEOUS
  Administered 2022-07-22 – 2022-07-23 (×2): 2 [IU] via SUBCUTANEOUS
  Administered 2022-07-23: 3 [IU] via SUBCUTANEOUS
  Administered 2022-07-23 (×3): 5 [IU] via SUBCUTANEOUS
  Administered 2022-07-24 (×2): 2 [IU] via SUBCUTANEOUS
  Administered 2022-07-24 (×4): 3 [IU] via SUBCUTANEOUS
  Administered 2022-07-25 (×2): 2 [IU] via SUBCUTANEOUS
  Administered 2022-07-25: 5 [IU] via SUBCUTANEOUS
  Administered 2022-07-25: 3 [IU] via SUBCUTANEOUS
  Administered 2022-07-25 – 2022-07-26 (×2): 2 [IU] via SUBCUTANEOUS
  Administered 2022-07-26: 1 [IU] via SUBCUTANEOUS
  Administered 2022-07-26: 2 [IU] via SUBCUTANEOUS
  Administered 2022-07-26 – 2022-07-27 (×2): 3 [IU] via SUBCUTANEOUS
  Administered 2022-07-27: 2 [IU] via SUBCUTANEOUS
  Administered 2022-07-27: 3 [IU] via SUBCUTANEOUS
  Administered 2022-07-27: 1 [IU] via SUBCUTANEOUS
  Administered 2022-07-27 – 2022-07-28 (×5): 2 [IU] via SUBCUTANEOUS
  Administered 2022-07-28: 5 [IU] via SUBCUTANEOUS

## 2022-07-21 MED ORDER — GABAPENTIN 300 MG PO CAPS
300.0000 mg | ORAL_CAPSULE | Freq: Every day | ORAL | Status: DC
  Administered 2022-07-22 – 2022-07-28 (×7): 300 mg via ORAL
  Filled 2022-07-21 (×7): qty 1

## 2022-07-21 MED ORDER — MAGNESIUM SULFATE 2 GM/50ML IV SOLN
2.0000 g | Freq: Once | INTRAVENOUS | Status: AC
  Administered 2022-07-21: 2 g via INTRAVENOUS
  Filled 2022-07-21: qty 50

## 2022-07-21 MED ORDER — POTASSIUM CHLORIDE 10 MEQ/100ML IV SOLN
10.0000 meq | INTRAVENOUS | Status: AC
  Administered 2022-07-21 (×2): 10 meq via INTRAVENOUS
  Filled 2022-07-21 (×2): qty 100

## 2022-07-21 MED ORDER — SODIUM CHLORIDE 0.9 % IV SOLN: INTRAVENOUS | Status: AC

## 2022-07-21 MED ORDER — ACETAMINOPHEN 500 MG PO TABS
1000.0000 mg | ORAL_TABLET | Freq: Once | ORAL | Status: AC
  Administered 2022-07-21: 1000 mg via ORAL
  Filled 2022-07-21: qty 2

## 2022-07-21 MED ORDER — POTASSIUM CHLORIDE CRYS ER 20 MEQ PO TBCR
60.0000 meq | EXTENDED_RELEASE_TABLET | Freq: Once | ORAL | Status: AC
  Administered 2022-07-21: 60 meq via ORAL
  Filled 2022-07-21: qty 3

## 2022-07-21 MED ORDER — SODIUM CHLORIDE 0.9 % IV BOLUS
1000.0000 mL | Freq: Once | INTRAVENOUS | Status: AC
  Administered 2022-07-22: 1000 mL via INTRAVENOUS

## 2022-07-21 MED ORDER — ACETAMINOPHEN 650 MG RE SUPP: 650.0000 mg | Freq: Four times a day (QID) | RECTAL | Status: DC | PRN

## 2022-07-21 MED ORDER — SODIUM CHLORIDE 0.9 % IV BOLUS
500.0000 mL | Freq: Once | INTRAVENOUS | Status: AC
  Administered 2022-07-21: 500 mL via INTRAVENOUS

## 2022-07-21 MED ORDER — KETOROLAC TROMETHAMINE 15 MG/ML IJ SOLN
15.0000 mg | Freq: Once | INTRAMUSCULAR | Status: AC
  Administered 2022-07-21: 15 mg via INTRAVENOUS
  Filled 2022-07-21: qty 1

## 2022-07-21 MED ORDER — POTASSIUM PHOSPHATES 15 MMOLE/5ML IV SOLN
15.0000 mmol | Freq: Once | INTRAVENOUS | Status: AC
  Administered 2022-07-21: 15 mmol via INTRAVENOUS
  Filled 2022-07-21: qty 5

## 2022-07-21 NOTE — Assessment & Plan Note (Addendum)
Restart Zocor 20 m gpo Qday

## 2022-07-21 NOTE — Assessment & Plan Note (Signed)
Restart home CPAP

## 2022-07-21 NOTE — ED Provider Notes (Signed)
[facility] Provider Note  CSN: [id number] Arrival date & time: 07/21/22 1542  Chief Complaint(s) Fall and Loss of Consciousness  HPI Alan Lin is a 67 y.o. male with history of diabetes, hypertension presenting to the emergency department for syncope.  Patient reports 3 episodes of syncope today.  He reports that he was first sitting on the edge of the bed, later walking into the kitchen twice, both times had sudden loss of consciousness without any prodromal symptoms such as nausea, vomiting, tunnel vision, lightheadedness, chest pain, palpitations.  He reports each time he woke up on the floor.  He did hit his head.  No nausea or vomiting.,  Numbness or tingling.  He reports he has some left-sided chest pain which began after his fall and his worse with taking a deep breath, worried he may have broken a rib.  No chest pain preceding the fall.  Denies any similar episodes in the past.   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 Authorizing Provider   acetaminophen (TYLENOL) 500 MG tablet Take 500 mg by mouth every 6 (six) hours as needed. Takes two 500mg  at bedtime    [provider]  amLODipine (NORVASC) 5 MG tablet Take 5 mg by mouth daily. 07/23/16   [provider]  chlorthalidone (HYGROTON) 25 MG tablet Take 25 mg by mouth daily. 07/07/16   [provider]  cyclobenzaprine (FLEXERIL) 10 MG tablet Take 10 mg by mouth daily as needed for muscle spasms.    [provider]  diclofenac Sodium (VOLTAREN) 1 % GEL Apply 2 g topically 4 (four) times daily. 09/20/19   [provider]  DULoxetine (CYMBALTA) 20 MG capsule duloxetine 20 mg capsule,delayed release  TAKE 2 CAPSULES BY MOUTH EVERY DAY 08/28/19   [provider]  gabapentin (NEURONTIN) 300 MG capsule Take 300 mg by mouth daily. One capsule in the morning 5 capsules in the evening 08/03/16   [provider]  losartan (COZAAR) 50 MG tablet Take 50 mg by mouth daily. 07/14/16   [provider]  metoprolol succinate (TOPROL-XL) 25 MG 24 hr tablet metoprolol succinate ER 25 mg tablet,extended release 24 hr 10/15/19   [provider]  Multiple Vitamins-Minerals (MULTIVITAMIN WITH MINERALS) tablet Take 1 tablet by mouth daily.    [provider]  potassium chloride SA (KLOR-CON M) 20 MEQ tablet One po bid x 3 days, then one po once a day 06/17/22   Lajean Saver, MD  simvastatin (ZOCOR) 20 MG tablet Take 20 mg by mouth daily. 07/07/16   [provider]  Past Surgical History History reviewed. No pertinent surgical history. Family History Family History  Problem Relation Age of Onset   Dementia Mother    Alzheimer's disease Mother    Kidney Stones Father     Social History Social History   Tobacco Use   Smoking status: Never   Smokeless tobacco: Never  Substance Use  Topics   Alcohol use: Never   Drug use: Never   Allergies Metformin, Oxycodone, Oxycodone-acetaminophen, Lisinopril, Morphine, and Morphine and related  Review of Systems Review of Systems  All other systems reviewed and are negative.   Physical Exam Vital Signs  I have reviewed the triage vital signs BP 131/81   Pulse (!) 106   Temp 98.4 F (36.9 C) (Oral)   Resp (!) 21   Ht 5\' 10"  (1.778 m)   Wt 117.9 kg   SpO2 93%   BMI 37.31 kg/m  Physical Exam Vitals and nursing note reviewed.  Constitutional:      General: He is not in acute distress.    Appearance: Normal appearance.  HENT:     Head: Normocephalic.     Comments: Small left frontal scalp contusion    Mouth/Throat:     Mouth: Mucous membranes are moist.  Eyes:     Conjunctiva/sclera: Conjunctivae normal.  Cardiovascular:     Rate and Rhythm: Normal rate. Rhythm irregular.  Pulmonary:     Effort: Pulmonary effort is normal. No respiratory distress.     Breath sounds: Normal breath sounds.  Abdominal:     General: Abdomen is flat.     Palpations: Abdomen is soft.     Tenderness: There is no abdominal tenderness.  Musculoskeletal:     Right lower leg: No edema.     Left lower leg: No edema.     Comments: No midline C, T, L-spine tenderness.  Full active range of motion of the bilateral upper and lower extremities with no focal tenderness or deformity.  Focal tenderness to the left-sided chest wall with no crepitus  Skin:    General: Skin is warm and dry.     Capillary Refill: Capillary refill takes less than 2 seconds.  Neurological:     General: No focal deficit present.     Mental Status: He is alert and oriented to person, place, and time. Mental status is at baseline.  Psychiatric:        Mood and Affect: Mood normal.        Behavior: Behavior normal.     ED Results and Treatments Labs (all labs ordered are listed, but only abnormal results are displayed) Labs Reviewed  COMPREHENSIVE METABOLIC  PANEL - Abnormal; Notable for the following components:      Result Value   Sodium 134 (*)    Potassium 2.7 (*)    CO2 19 (*)    Glucose, Bld 280 (*)    BUN 24 (*)    Calcium 8.4 (*)    Total Protein 5.3 (*)    Albumin 3.1 (*)    AST 46 (*)    All other components within normal limits  CBC WITH DIFFERENTIAL/PLATELET - Abnormal; Notable for the following components:   WBC 11.7 (*)    HCT 37.9 (*)    Neutro Abs 9.9 (*)    Abs Immature Granulocytes 0.12 (*)    All other components within normal limits  URINALYSIS, ROUTINE W REFLEX MICROSCOPIC - Abnormal; Notable for the following components:   Glucose, UA >=500 (*)  Ketones, ur 5 (*)    All other components within normal limits  MAGNESIUM - Abnormal; Notable for the following components:   Magnesium 1.6 (*)    All other components within normal limits  RESP PANEL BY RT-PCR (RSV, FLU A&B, COVID)  RVPGX2  PROTIME-INR  BASIC METABOLIC PANEL  BASIC METABOLIC PANEL  HEMOGLOBIN A1C  CK  LACTIC ACID, PLASMA  LACTIC ACID, PLASMA  PHOSPHORUS  PREALBUMIN  TSH  URINALYSIS, COMPLETE (UACMP) WITH MICROSCOPIC  D-DIMER, QUANTITATIVE  TROPONIN I (HIGH SENSITIVITY)  TROPONIN I (HIGH SENSITIVITY)                                                                                                                          Radiology CT Cervical Spine Wo Contrast  Result Date: 07/21/2022 CLINICAL DATA:  [place], loss of consciousness EXAM: CT CERVICAL SPINE WITHOUT CONTRAST TECHNIQUE: Multidetector CT imaging of the cervical spine was performed without intravenous contrast. Multiplanar CT image reconstructions were also generated. RADIATION DOSE REDUCTION: This exam was performed according to the departmental dose-optimization program which includes automated exposure control, adjustment of the mA and/or kV according to patient size and/or use of iterative reconstruction technique. COMPARISON:  05/21/2022 FINDINGS: Alignment: Alignment is grossly  anatomic. Skull base and vertebrae: Postsurgical changes at C4-5. There are no acute or destructive bony abnormalities. Soft tissues and spinal canal: No prevertebral fluid or swelling. No visible canal hematoma. Prominent atherosclerosis of the carotid bifurcations. Disc levels: Prior ACDF at the C4-5 level. There is bony fusion across the right C3-4 and C4-5 facet joints. Marked spondylosis at C5-6 and C6-7, with left greater than right neural foraminal encroachment. Upper chest: Airway is patent.  Lung apices are clear. Other: Reconstructed images demonstrate no additional findings. IMPRESSION: 1. No acute cervical spine fracture. 2. Prior C4-5 ACDF, with multilevel spondylosis and facet hypertrophy as above. Electronically Signed   By: Randa Ngo M.D.   On: 07/21/2022 18:02   CT Head Wo Contrast  Result Date: 07/21/2022 CLINICAL DATA:  [place], loss of consciousness EXAM: CT HEAD WITHOUT CONTRAST TECHNIQUE: Contiguous axial images were obtained from the base of the skull through the vertex without intravenous contrast. RADIATION DOSE REDUCTION: This exam was performed according to the departmental dose-optimization program which includes automated exposure control, adjustment of the mA and/or kV according to patient size and/or use of iterative reconstruction technique. COMPARISON:  06/17/2022 FINDINGS: Brain: No acute infarct or hemorrhage. Lateral ventricles and midline structures are unremarkable. No acute extra-axial fluid collections. No mass effect. Vascular: No hyperdense vessel or unexpected calcification. Skull: Normal. Negative for fracture or focal lesion. Sinuses/Orbits: No acute finding. Other: None. IMPRESSION: 1. Stable head CT, no acute intracranial process. Electronically Signed   By: Randa Ngo M.D.   On: 07/21/2022 17:59   DG Ribs Unilateral W/Chest Left  Result Date: 07/21/2022 CLINICAL DATA:  Multiple falls today with left-sided chest pain, initial encounter EXAM: LEFT RIBS AND  CHEST - 3+ VIEW COMPARISON:  05/21/2022 FINDINGS: Check shadow is within normal limits. Aortic calcifications are seen. The lungs are hypoinflated. No infiltrate, effusion or pneumothorax is seen. Undisplaced posterolateral left eighth rib fracture is seen. No other fractures are seen. IMPRESSION: Left eighth rib fracture as described without complicating factors. Electronically Signed   By: Inez Catalina M.D.   On: 07/21/2022 17:59    Pertinent labs & imaging results that were available during my care of the patient were reviewed by me and considered in my medical decision making (see MDM for details).  Medications Ordered in ED Medications  magnesium sulfate IVPB 2 g 50 mL (2 g Intravenous New Bag/Given 07/21/22 1838)  potassium chloride 10 mEq in 100 mL IVPB (has no administration in time range)  insulin aspart (novoLOG) injection 0-9 Units (has no administration in time range)  acetaminophen (TYLENOL) tablet 1,000 mg (1,000 mg Oral Given 07/21/22 1833)  potassium chloride SA (KLOR-CON M) CR tablet 60 mEq (60 mEq Oral Given 07/21/22 1832)  ketorolac (TORADOL) 15 MG/ML injection 15 mg (15 mg Intravenous Given 07/21/22 1833)                                                                                                                                     Procedures Procedures  (including critical care time)  Medical Decision Making / ED Course   MDM:  67 year old male presenting to the emergency department with recurrent syncope.  Patient is well-appearing, physical exam with left-sided chest wall tenderness, head contusion, no other signs of trauma.  Heart rate is irregular, appears to be possibly atrial fibrillation which is new.  Not in RVR.  Unclear cause of syncope, no clear preceding symptoms to suggest underlying cause such as vasovagal or orthostatic syncope.  Given sudden syncope, some concern for cardiac cause.  Does have electrolyte abnormalities, which will be repleted.  Chest x-ray  notable for left-sided rib fracture without sign of pneumothorax.  Will treat pain.  CT head unremarkable, no evidence of intracranial pathology such as bleeding.  No recent leg swelling, travel, surgeries, chest pain before fall that would suggest pulmonary embolism.  Does not seem consistent with seizure.  Given age and no prior history with multiple episodes, will admit for further management.  Clinical Course as of 07/21/22 1932  Thu Jul 21, 2022  1834 Discussed with hospitalist, hospitalist will admit patient.  Labs notable for hypokalemia and hypomagnesemia which will be repleted. [WS]  1931 Admitted to hospitalist for syncope workup. [WS]    Clinical Course User Index [WS] Cristie Hem, MD     Additional history obtained: -Additional history obtained from ems -External records from outside source obtained and reviewed including: Chart review including previous notes, labs, imaging, consultation notes including ED visit 06/17/22   Lab Tests: -I ordered, reviewed, and interpreted labs.   The pertinent results include:   Labs Reviewed  COMPREHENSIVE METABOLIC PANEL - Abnormal; Notable for the  following components:      Result Value   Sodium 134 (*)    Potassium 2.7 (*)    CO2 19 (*)    Glucose, Bld 280 (*)    BUN 24 (*)    Calcium 8.4 (*)    Total Protein 5.3 (*)    Albumin 3.1 (*)    AST 46 (*)    All other components within normal limits  CBC WITH DIFFERENTIAL/PLATELET - Abnormal; Notable for the following components:   WBC 11.7 (*)    HCT 37.9 (*)    Neutro Abs 9.9 (*)    Abs Immature Granulocytes 0.12 (*)    All other components within normal limits  URINALYSIS, ROUTINE W REFLEX MICROSCOPIC - Abnormal; Notable for the following components:   Glucose, UA >=500 (*)    Ketones, ur 5 (*)    All other components within normal limits  MAGNESIUM - Abnormal; Notable for the following components:   Magnesium 1.6 (*)    All other components within normal limits   RESP PANEL BY RT-PCR (RSV, FLU A&B, COVID)  RVPGX2  PROTIME-INR  BASIC METABOLIC PANEL  BASIC METABOLIC PANEL  HEMOGLOBIN A1C  CK  LACTIC ACID, PLASMA  LACTIC ACID, PLASMA  PHOSPHORUS  PREALBUMIN  TSH  URINALYSIS, COMPLETE (UACMP) WITH MICROSCOPIC  D-DIMER, QUANTITATIVE  TROPONIN I (HIGH SENSITIVITY)  TROPONIN I (HIGH SENSITIVITY)    Notable for normal troponin, hypokalemia, hypomag  EKG   EKG Interpretation  Date/Time:  Thursday July 21 2022 15:54:39 EST Ventricular Rate:  104 PR Interval:  167 QRS Duration: 102 QT Interval:  395 QTC Calculation: 520 R Axis:   63 Text Interpretation: Atrial fibrillation Borderline repolarization abnormality Prolonged QT interval Confirmed by Garnette Gunner [phone]) on 07/21/2022 4:28:57 PM         Imaging Studies ordered: I ordered imaging studies including CT head, CT neck, CXR On my interpretation imaging demonstrates left rib fx I independently visualized and interpreted imaging. I agree with the radiologist interpretation   Medicines ordered and prescription drug management: Meds ordered this encounter  Medications   acetaminophen (TYLENOL) tablet 1,000 mg   magnesium sulfate IVPB 2 g 50 mL   potassium chloride SA (KLOR-CON M) CR tablet 60 mEq   ketorolac (TORADOL) 15 MG/ML injection 15 mg   potassium chloride 10 mEq in 100 mL IVPB   insulin aspart (novoLOG) injection 0-9 Units    Order Specific Question:   Correction coverage:    Answer:   Sensitive (thin, NPO, renal)    Order Specific Question:   CBG < 70:    Answer:   Implement Hypoglycemia Standing Orders and refer to Hypoglycemia Standing Orders sidebar report    Order Specific Question:   CBG 70 - 120:    Answer:   0 units    Order Specific Question:   CBG 121 - 150:    Answer:   1 unit    Order Specific Question:   CBG 151 - 200:    Answer:   2 units    Order Specific Question:   CBG 201 - 250:    Answer:   3 units    Order Specific Question:   CBG 251  - 300:    Answer:   5 units    Order Specific Question:   CBG 301 - 350:    Answer:   7 units    Order Specific Question:   CBG 351 - 400    Answer:  9 units    Order Specific Question:   CBG > 400    Answer:   call MD and obtain STAT lab verification    -I have reviewed the patients home medicines and have made adjustments as needed   Consultations Obtained: I requested consultation with the hospitalist,  and discussed lab and imaging findings as well as pertinent plan - they recommend: admission   Cardiac Monitoring: The patient was maintained on a cardiac monitor.  I personally viewed and interpreted the cardiac monitored which showed an underlying rhythm of: afib  Social Determinants of Health:  Diagnosis or treatment significantly limited by social determinants of health: obesity   Reevaluation: After the interventions noted above, I reevaluated the patient and found that they have improved  Co morbidities that complicate the patient evaluation  Past Medical History:  Diagnosis Date   Arthritis    Diabetes mellitus without complication ([place])    Hypertension    Sleep apnea       Dispostion: Disposition decision including need for hospitalization was considered, and patient admitted to the hospital.    Final Clinical Impression(s) / ED Diagnoses Final diagnoses:  Syncope and collapse  Closed fracture of one rib of left side, initial encounter  Contusion of forehead, initial encounter     This chart was dictated using voice recognition software.  Despite best efforts to proofread,  errors can occur which can change the documentation meaning.    Cristie Hem, MD 07/21/22 [phone]

## 2022-07-21 NOTE — Assessment & Plan Note (Signed)
Will replace and recheck in AM 

## 2022-07-21 NOTE — Assessment & Plan Note (Signed)
Will need PT OT prior to discharge patient with frequent falls

## 2022-07-21 NOTE — ED Triage Notes (Signed)
BIB EMS after having syncopal episode.  Patient complains of left side pain and patient has fallen 3 times since 4 am.  BP 142/70 CBG 277  HR 105  94%  RA Patient selectively wears neck pillow for neck pain for years.  Denies CP SOB N/v headache only dizziness the times he has fallen. No unilateral weakness.

## 2022-07-21 NOTE — Assessment & Plan Note (Signed)
>>  ASSESSMENT AND PLAN FOR DM2 (DIABETES MELLITUS, TYPE 2) (HCC) WRITTEN ON 07/21/2022  7:16 PM BY DOUTOVA, ANASTASSIA, MD  Order ssi

## 2022-07-21 NOTE — Assessment & Plan Note (Signed)
given risk factor will admit rehydrate obtain CE, monitor on tele and obtain carotid dopplers, echo, cardiology consult in Am

## 2022-07-21 NOTE — H&P (Signed)
Alan Lin GYF:[id number] DOB: 1956/02/10 DOA: 07/21/2022     PCP: Doug Sou, MD   Outpatient Specialists:    NEurology  Dr. Allena Katz  Cardiology at [facility]  Patient arrived to ER on 07/21/22 at 1542 Referred by Attending Therisa Doyne, MD   Patient coming from:    home Lives alone,        Chief Complaint:   Chief Complaint  Patient presents with   Fall   Loss of Consciousness    HPI: Alan Lin is a 67 y.o. male with medical history significant of Dm2, HTN, falls    Presented with  falls Pt with hx of unsteady gate and falls followed by Neurology Last fall was in December 2023 Presents with 3 what appears to be syncopal episodes happened today.  He has fallen 3 times hitting his back and head no prodrome no chest pain or shortness of breath no nausea no vomiting He did felt a little dizzy 1 time that he has fallen No localizing neurological deficits Reports 1 fall occurred while sitting on the bed and the other 1 occurred later walking into the kitchen patient first reported it seems to be sudden onset.  Every time he woke up on the floor.  Have left-sided chest pain which only started after his fall.  And this pleuritic prior to fall no chest pains.   At 4 Am he was trying to get back to bed and ended up on the floor Called EMS and they got him up  10 AM he gone to the bathroom turned to the kitchen did not remember falling  When he woke up got Siri to call someone  Later on he was sitting on the sofa felt lightheaded but had to get up to go to the bathroom and fell down again  He has trouble ambulating at baseline He has chronic tachycardia  Does not smoke or drink  Had ope episode of diarrhea in the night Took imodium   Initial COVID TEST  NEGATIVE   Lab Results  Component Value Date   SARSCOV2NAA NEGATIVE 07/21/2022   SARSCOV2NAA POSITIVE (A) 06/17/2022     Regarding pertinent Chronic problems:     Hyperlipidemia -  on statins Zocor  (simvastatin)      HTN on NOrvasc, chlorthalidone cozaar, toprol   DM 2 -  diet controlled     obesity-   BMI Readings from Last 1 Encounters:  07/21/22 37.31 kg/m        OSA -on nocturnal  CPAP,      While in ER: Clinical Course as of 07/21/22 1949  Thu Jul 21, 2022  1834 Discussed with hospitalist, hospitalist will admit patient.  Labs notable for hypokalemia and hypomagnesemia which will be repleted. [WS]  1931 Admitted to hospitalist for syncope workup. [WS]    Clinical Course User Index [WS] Lonell Grandchild, MD      Ordered  CT HEAD/neck   NON acute  CXR - Left eighth rib fracture as described without complicating factors.     Following Medications were ordered in ER: Medications  magnesium sulfate IVPB 2 g 50 mL (2 g Intravenous New Bag/Given 07/21/22 1838)  potassium chloride 10 mEq in 100 mL IVPB (has no administration in time range)  acetaminophen (TYLENOL) tablet 1,000 mg (1,000 mg Oral Given 07/21/22 1833)  potassium chloride SA (KLOR-CON M) CR tablet 60 mEq (60 mEq Oral Given 07/21/22 1832)  ketorolac (TORADOL) 15 MG/ML injection 15 mg (  15 mg Intravenous Given 07/21/22 1833)       ED Triage Vitals  Enc Vitals Group     BP 07/21/22 1555 (!) 126/58     Pulse Rate 07/21/22 1555 (!) 102     Resp 07/21/22 1555 19     Temp 07/21/22 1555 98.4 F (36.9 C)     Temp Source 07/21/22 1555 Oral     SpO2 07/21/22 1555 90 %     Weight 07/21/22 1548 260 lb (117.9 kg)     Height 07/21/22 1548 5\' 10"  (1.778 m)     Head Circumference --      Peak Flow --      Pain Score 07/21/22 1555 7     Pain Loc --      Pain Edu? --      Excl. in [place]? --   TMAX(24)@     _________________________________________ Significant initial  Findings: Abnormal Labs Reviewed  COMPREHENSIVE METABOLIC PANEL - Abnormal; Notable for the following components:      Result Value   Sodium 134 (*)    Potassium 2.7 (*)    CO2 19 (*)    Glucose, Bld 280 (*)    BUN 24 (*)    Calcium 8.4 (*)     Total Protein 5.3 (*)    Albumin 3.1 (*)    AST 46 (*)    All other components within normal limits  CBC WITH DIFFERENTIAL/PLATELET - Abnormal; Notable for the following components:   WBC 11.7 (*)    HCT 37.9 (*)    Neutro Abs 9.9 (*)    Abs Immature Granulocytes 0.12 (*)    All other components within normal limits  MAGNESIUM - Abnormal; Notable for the following components:   Magnesium 1.6 (*)    All other components within normal limits     _________________________ Troponin 10 ECG: Ordered Personally reviewed and interpreted by me showing: HR : 104 Rhythm:  Sinus tachycardia w PAC  no evidence of ischemic changes QTC 520    The recent clinical data is shown below. Vitals:   07/21/22 1555 07/21/22 1555 07/21/22 1600 07/21/22 1715  BP: (!) 126/58  109/87 131/81  Pulse: (!) 102  (!) 109 (!) 106  Resp: 19  14 (!) 21  Temp:  98.4 F (36.9 C)    TempSrc:  Oral    SpO2: 90%  94% 93%  Weight:      Height:          WBC     Component Value Date/Time   WBC 11.7 (H) 07/21/2022 1710   LYMPHSABS 0.9 07/21/2022 1710   MONOABS 0.6 07/21/2022 1710   EOSABS 0.0 07/21/2022 1710   BASOSABS 0.1 07/21/2022 1710     Lactic Acid, Venous No results found for: "LATICACIDVEN"    UA  ordered   Urine analysis:    Component Value Date/Time   COLORURINE YELLOW 06/17/2022 [place] 06/17/2022 1143   LABSPEC 1.026 06/17/2022 1143   PHURINE 6.0 06/17/2022 1143   GLUCOSEU >=500 (A) 06/17/2022 1143   HGBUR NEGATIVE 06/17/2022 1143   BILIRUBINUR NEGATIVE 06/17/2022 1143   KETONESUR NEGATIVE 06/17/2022 1143   PROTEINUR NEGATIVE 06/17/2022 1143   NITRITE NEGATIVE 06/17/2022 1143   LEUKOCYTESUR NEGATIVE 06/17/2022 1143    Results for orders placed or performed during the hospital encounter of 07/21/22  Resp panel by RT-PCR (RSV, Flu A&B, Covid) Anterior Nasal Swab     Status: None   Collection Time: 07/21/22  4:26 PM   Specimen: Anterior Nasal Swab  Result Value  Ref Range Status   SARS Coronavirus 2 by RT PCR NEGATIVE NEGATIVE Final   Influenza A by PCR NEGATIVE NEGATIVE Final   Influenza B by PCR NEGATIVE NEGATIVE Final         Resp Syncytial Virus by PCR NEGATIVE NEGATIVE Final           _______________________________________ Hospitalist was called for admission for   Syncope and collapse    Closed fracture of one rib of left side, initial encounter     The following Work up has been ordered so far:  Orders Placed This Encounter  Procedures   Resp panel by RT-PCR (RSV, Flu A&B, Covid) Anterior Nasal Swab   CT Head Wo Contrast   CT Cervical Spine Wo Contrast   DG Ribs Unilateral W/Chest Left   Comprehensive metabolic panel   CBC with Differential   Protime-INR   Urinalysis, Routine w reflex microscopic -Urine, Clean Catch   Magnesium   Basic metabolic panel   Cardiac Monitoring - Continuous Indefinite   Consult for Unassigned Medical Admission   EKG 12-Lead   Place in observation (patient's expected length of stay will be less than 2 midnights)     OTHER Significant initial  Findings:  labs showing:    Recent Labs  Lab 07/21/22 1710  NA 134*  K 2.7*  CO2 19*  GLUCOSE 280*  BUN 24*  CREATININE 0.82  CALCIUM 8.4*  MG 1.6*  PHOS 1.4*    Cr stable,    Lab Results  Component Value Date   CREATININE 0.82 07/21/2022   CREATININE 0.96 06/17/2022   CREATININE 0.89 05/21/2022    Recent Labs  Lab 07/21/22 1710  AST 46*  ALT 37  ALKPHOS 45  BILITOT 1.0  PROT 5.3*  ALBUMIN 3.1*   Lab Results  Component Value Date   CALCIUM 8.4 (L) 07/21/2022    Plt: Lab Results  Component Value Date   PLT 217 07/21/2022     COVID-19 Labs  No results for input(s): "DDIMER", "FERRITIN", "LDH", "CRP" in the last 72 hours.  Lab Results  Component Value Date   SARSCOV2NAA NEGATIVE 07/21/2022   SARSCOV2NAA POSITIVE (A) 06/17/2022       Recent Labs  Lab 07/21/22 1710  WBC 11.7*  NEUTROABS 9.9*  HGB 13.1  HCT  37.9*  MCV 83.8  PLT 217    HG/HCT stable,       Component Value Date/Time   HGB 13.1 07/21/2022 1710   HCT 37.9 (L) 07/21/2022 1710   MCV 83.8 07/21/2022 1710      Cardiac Panel (last 3 results) Recent Labs    07/21/22 1710  CKTOTAL 49    No results for input(s): "BNP" in the last 8760 hours.    DM  labs:  HbA1C: No results for input(s): "HGBA1C" in the last 8760 hours.     CBG (last 3)  Recent Labs    07/21/22 2018  GLUCAP 239*       Cultures: No results found for: "SDES", "SPECREQUEST", "CULT", "REPTSTATUS"   Radiological Exams on Admission: CT Cervical Spine Wo Contrast  Result Date: 07/21/2022 CLINICAL DATA:  [place], loss of consciousness EXAM: CT CERVICAL SPINE WITHOUT CONTRAST TECHNIQUE: Multidetector CT imaging of the cervical spine was performed without intravenous contrast. Multiplanar CT image reconstructions were also generated. RADIATION DOSE REDUCTION: This exam was performed according to the departmental dose-optimization program which includes automated exposure control, adjustment of  the mA and/or kV according to patient size and/or use of iterative reconstruction technique. COMPARISON:  05/21/2022 FINDINGS: Alignment: Alignment is grossly anatomic. Skull base and vertebrae: Postsurgical changes at C4-5. There are no acute or destructive bony abnormalities. Soft tissues and spinal canal: No prevertebral fluid or swelling. No visible canal hematoma. Prominent atherosclerosis of the carotid bifurcations. Disc levels: Prior ACDF at the C4-5 level. There is bony fusion across the right C3-4 and C4-5 facet joints. Marked spondylosis at C5-6 and C6-7, with left greater than right neural foraminal encroachment. Upper chest: Airway is patent.  Lung apices are clear. Other: Reconstructed images demonstrate no additional findings. IMPRESSION: 1. No acute cervical spine fracture. 2. Prior C4-5 ACDF, with multilevel spondylosis and facet hypertrophy as above. Electronically  Signed   By: Sharlet Salina M.D.   On: 07/21/2022 18:02   CT Head Wo Contrast  Result Date: 07/21/2022 CLINICAL DATA:  [place], loss of consciousness EXAM: CT HEAD WITHOUT CONTRAST TECHNIQUE: Contiguous axial images were obtained from the base of the skull through the vertex without intravenous contrast. RADIATION DOSE REDUCTION: This exam was performed according to the departmental dose-optimization program which includes automated exposure control, adjustment of the mA and/or kV according to patient size and/or use of iterative reconstruction technique. COMPARISON:  06/17/2022 FINDINGS: Brain: No acute infarct or hemorrhage. Lateral ventricles and midline structures are unremarkable. No acute extra-axial fluid collections. No mass effect. Vascular: No hyperdense vessel or unexpected calcification. Skull: Normal. Negative for fracture or focal lesion. Sinuses/Orbits: No acute finding. Other: None. IMPRESSION: 1. Stable head CT, no acute intracranial process. Electronically Signed   By: Sharlet Salina M.D.   On: 07/21/2022 17:59   DG Ribs Unilateral W/Chest Left  Result Date: 07/21/2022 CLINICAL DATA:  Multiple falls today with left-sided chest pain, initial encounter EXAM: LEFT RIBS AND CHEST - 3+ VIEW COMPARISON:  05/21/2022 FINDINGS: Check shadow is within normal limits. Aortic calcifications are seen. The lungs are hypoinflated. No infiltrate, effusion or pneumothorax is seen. Undisplaced posterolateral left eighth rib fracture is seen. No other fractures are seen. IMPRESSION: Left eighth rib fracture as described without complicating factors. Electronically Signed   By: Alcide Clever M.D.   On: 07/21/2022 17:59   _______________________________________________________________________________________________________ Latest  Blood pressure 131/81, pulse (!) 106, temperature 98.4 F (36.9 C), temperature source Oral, resp. rate (!) 21, height 5\' 10"  (1.778 m), weight 117.9 kg, SpO2 93 %.   Vitals  labs and  radiology finding personally reviewed  Review of Systems:    Pertinent positives include:  falls, syncope  Constitutional:  No weight loss, night sweats, Fevers, chills, fatigue, weight loss  HEENT:  No headaches, Difficulty swallowing,Tooth/dental problems,Sore throat,  No sneezing, itching, ear ache, nasal congestion, post nasal drip,  Cardio-vascular:  No chest pain, Orthopnea, PND, anasarca, dizziness, palpitations.no Bilateral lower extremity swelling  GI:  No heartburn, indigestion, abdominal pain, nausea, vomiting, diarrhea, change in bowel habits, loss of appetite, melena, blood in stool, hematemesis Resp:  no shortness of breath at rest. No dyspnea on exertion, No excess mucus, no productive cough, No non-productive cough, No coughing up of blood.No change in color of mucus.No wheezing. Skin:  no rash or lesions. No jaundice GU:  no dysuria, change in color of urine, no urgency or frequency. No straining to urinate.  No flank pain.  Musculoskeletal:  No joint pain or no joint swelling. No decreased range of motion. No back pain.  Psych:  No change in mood or affect. No depression or  anxiety. No memory loss.  Neuro: no localizing neurological complaints, no tingling, no weakness, no double vision, no gait abnormality, no slurred speech, no confusion  All systems reviewed and apart from HOPI all are negative _______________________________________________________________________________________________ Past Medical History:   Past Medical History:  Diagnosis Date   Arthritis    Diabetes mellitus without complication (HCC)    Hypertension    Sleep apnea       History reviewed. No pertinent surgical history.  Social History:  Ambulatory  cane, walker      reports that he has never smoked. He has never used smokeless tobacco. He reports that he does not drink alcohol and does not use drugs.     Family History:  Family History  Problem Relation Age of Onset    Dementia Mother    Alzheimer's disease Mother    Kidney Stones Father    ______________________________________________________________________________________________ Allergies: Allergies  Allergen Reactions   Metformin Diarrhea and Nausea And Vomiting   Oxycodone Other (See Comments)    Constipation   Oxycodone-Acetaminophen     Constipation   Lisinopril Cough   Morphine Itching    Other reaction(s): Other (See Comments)  Constipation   Morphine And Related Itching     Prior to Admission medications   Medication Sig Start Date End Date Taking? Authorizing Provider  acetaminophen (TYLENOL) 500 MG tablet Take 500 mg by mouth every 6 (six) hours as needed. Takes two 500mg  at bedtime    [provider]  amLODipine (NORVASC) 5 MG tablet Take 5 mg by mouth daily. 07/23/16   [provider]  chlorthalidone (HYGROTON) 25 MG tablet Take 25 mg by mouth daily. 07/07/16   [provider]  cyclobenzaprine (FLEXERIL) 10 MG tablet Take 10 mg by mouth daily as needed for muscle spasms.    [provider]  diclofenac Sodium (VOLTAREN) 1 % GEL Apply 2 g topically 4 (four) times daily. 09/20/19   [provider]  DULoxetine (CYMBALTA) 20 MG capsule duloxetine 20 mg capsule,delayed release  TAKE 2 CAPSULES BY MOUTH EVERY DAY 08/28/19   [provider]  gabapentin (NEURONTIN) 300 MG capsule Take 300 mg by mouth daily. One capsule in the morning 5 capsules in the evening 08/03/16   [provider]  losartan (COZAAR) 50 MG tablet Take 50 mg by mouth daily. 07/14/16   [provider]  metoprolol succinate (TOPROL-XL) 25 MG 24 hr tablet metoprolol succinate ER 25 mg tablet,extended release 24 hr 10/15/19   [provider]  Multiple Vitamins-Minerals (MULTIVITAMIN WITH MINERALS) tablet Take 1 tablet by mouth daily.    [provider]  potassium chloride SA (KLOR-CON M) 20 MEQ tablet One po bid x 3 days, then one po once a day  06/17/22   06/19/22, MD  simvastatin (ZOCOR) 20 MG tablet Take 20 mg by mouth daily. 07/07/16   [provider]    ___________________________________________________________________________________________________ Physical Exam:    07/21/2022    5:15 PM 07/21/2022    4:00 PM 07/21/2022    3:55 PM  Vitals with BMI  Systolic 131 109 09/19/2022  Diastolic 81 87 58  Pulse 106 109 102     1. General:  in No  Acute distress    Chronically ill   -appearing 2. Psychological: Alert and  Oriented 3. Head/ENT:    Dry Mucous Membranes  Head Non traumatic, neck supple                         Poor Dentition 4. SKIN:  decreased Skin turgor,  Skin clean Dry and intact no rash 5. Heart: Regular rate and rhythm no  Murmur, no Rub or gallop 6. Lungs:   no wheezes or crackles   7. Abdomen: Soft,  non-tender, Non distended   obese  bowel sounds present 8. Lower extremities: no clubbing, cyanosis, no  edema 9. Neurologically  strength 5 out of 5 in all 4 extremities cranial nerves II through XII intact 10. MSK: Normal range of motion    Chart has been reviewed  _________________________   Assessment/Plan 67 y.o. male with medical history significant of Dm2, HTN, falls    Admitted for Syncope and collapse  Closed fracture of one rib of left side, initial encounter     Present on Admission:  Syncope  Hypomagnesemia  Hypokalemia  Hyperlipidemia  Essential hypertension  Hypophosphatemia  OSA (obstructive sleep apnea)  Falls  Rib fracture     Syncope  given risk factor will admit rehydrate obtain CE, monitor on tele and obtain carotid dopplers, echo, cardiology consult in Am   Diabetes mellitus with no complication ([place]) Order ssi  Hypomagnesemia Will replace and recheck in AM  Hypokalemia Will replace  Hyperlipidemia Restart Zocor 36 m gpo Qday  Essential hypertension Allow permissive HTn for tonight  Hypophosphatemia Replace and recheck in  a.m.  OSA (obstructive sleep apnea) Restart home CPAP  Falls Will need PT OT prior to discharge patient with frequent falls  Rib fracture Supportive management ordered incentive spirometer   Other plan as per orders.  DVT prophylaxis:  SCD      Code Status:    Code Status: Not on file FULL CODE as per patient   I had personally discussed CODE STATUS with patient    Family Communication:   Family not at  Bedside    Disposition Plan:       To home once workup is complete and patient is stable   Following barriers for discharge:                            Electrolytes corrected                             Will need consultants to evaluate patient prior to discharge   Would benefit from PT/OT eval prior to DC  Ordered                                       Consults called:    emailed cardiology   Admission status:  ED Disposition     ED Disposition  [place]: [place] [[id number]]  Level of Care: Progressive [102]  Admit to Progressive based on following criteria: CARDIOVASCULAR & THORACIC of moderate stability with acute coronary syndrome symptoms/low risk myocardial infarction/hypertensive urgency/arrhythmias/heart failure potentially compromising stability and stable post cardiovascular intervention patients.  May place patient in observation at [facility] or [place] if equivalent level of care is available:: No  Covid Evaluation: Confirmed COVID Negative  Diagnosis: Syncope [206001]  Admitting Physician: Toy Baker [[id number]]  Attending  Physician: Therisa Doyne [[id number]]          Obs    Level of care       progressive tele indefinitely please discontinue once patient no longer qualifies COVID-19 Labs    Lab Results  Component Value Date   SARSCOV2NAA NEGATIVE 07/21/2022     Precautions: admitted as   Covid Negative   Lynzie Cliburn 07/21/2022, 8:57 PM    [facility]     after  2 AM please page floor coverage PA If 7AM-7PM, please contact the day team taking care of the patient using [email or web address]   Patient was evaluated in the context of the global COVID-19 pandemic, which necessitated consideration that the patient might be at risk for infection with the SARS-CoV-2 virus that causes COVID-19. Institutional protocols and algorithms that pertain to the evaluation of patients at risk for COVID-19 are in a state of rapid change based on information released by regulatory bodies including the CDC and federal and state organizations. These policies and algorithms were followed during the patient's care.

## 2022-07-21 NOTE — Assessment & Plan Note (Signed)
Allow permissive HTn for tonight

## 2022-07-21 NOTE — Assessment & Plan Note (Signed)
Supportive management ordered incentive spirometer

## 2022-07-21 NOTE — ED Notes (Signed)
ED TO INPATIENT HANDOFF REPORT  ED Nurse Name and Phone #: Neomi Laidler RN [phone]  S Name/Age/Gender Alan Lin 67 y.o. male Room/Bed: 040C/040C  Code Status   Code Status: Full Code  Home/SNF/Other Home Patient oriented to: self, place, time, and situation Is this baseline? Yes   Triage Complete: Triage complete  Chief Complaint Syncope [R55]  Triage Note BIB EMS after having syncopal episode.  Patient complains of left side pain and patient has fallen 3 times since 4 am.  BP 142/70 CBG 277  HR 105  94%  RA Patient selectively wears neck pillow for neck pain for years.  Denies CP SOB N/v headache only dizziness the times he has fallen. No unilateral weakness.    Allergies Allergies  Allergen Reactions   Metformin Diarrhea and Nausea And Vomiting   Oxycodone Other (See Comments)    Constipation   Oxycodone-Acetaminophen     Constipation   Lisinopril Cough   Morphine Itching    Other reaction(s): Other (See Comments)  Constipation   Morphine And Related Itching    Level of Care/Admitting Diagnosis ED Disposition     ED Disposition  Admit   Condition  --   Comment  Hospital Area: [facility] [[id number]]  Level of Care: Progressive [102]  Admit to Progressive based on following criteria: CARDIOVASCULAR & THORACIC of moderate stability with acute coronary syndrome symptoms/low risk myocardial infarction/hypertensive urgency/arrhythmias/heart failure potentially compromising stability and stable post cardiovascular intervention patients.  May place patient in observation at [facility] or [facility] if equivalent level of care is available:: No  Covid Evaluation: Confirmed COVID Negative  Diagnosis: Syncope [206001]  Admitting Physician: Therisa Doyne [[id number]]  Attending Physician: Therisa Doyne [[id number]]          B Medical/Surgery History Past Medical History:  Diagnosis Date   Arthritis    Diabetes mellitus without complication  (HCC)    Hypertension    Sleep apnea    History reviewed. No pertinent surgical history.   A IV Location/Drains/Wounds Patient Lines/Drains/Airways Status     Active Line/Drains/Airways     Name Placement date Placement time Site Days   Peripheral IV 07/21/22 20 G Left;Posterior Hand 07/21/22  --  Hand  less than 1            Intake/Output Last 24 hours No intake or output data in the 24 hours ending 07/21/22 2008  Labs/Imaging Results for orders placed or performed during the hospital encounter of 07/21/22 (from the past 48 hour(s))  Resp panel by RT-PCR (RSV, Flu A&B, Covid) Anterior Nasal Swab     Status: None   Collection Time: 07/21/22  4:26 PM   Specimen: Anterior Nasal Swab  Result Value Ref Range   SARS Coronavirus 2 by RT PCR NEGATIVE NEGATIVE   Influenza A by PCR NEGATIVE NEGATIVE   Influenza B by PCR NEGATIVE NEGATIVE    Comment: (NOTE) The Xpert Xpress SARS-CoV-2/FLU/RSV plus assay is intended as an aid in the diagnosis of influenza from Nasopharyngeal swab specimens and should not be used as a sole basis for treatment. Nasal washings and aspirates are unacceptable for Xpert Xpress SARS-CoV-2/FLU/RSV testing.  Fact Sheet for Patients: BloggerCourse.com  Fact Sheet for Healthcare Providers: SeriousBroker.it  This test is not yet approved or cleared by the [place] FDA and has been authorized for detection and/or diagnosis of SARS-CoV-2 by FDA under an Emergency Use Authorization (EUA). This EUA will remain in effect (meaning this test can be  used) for the duration of the COVID-19 declaration under Section 564(b)(1) of the Act, 21 U.S.C. section 360bbb-3(b)(1), unless the authorization is terminated or revoked.     Resp Syncytial Virus by PCR NEGATIVE NEGATIVE    Comment: (NOTE) Fact Sheet for Patients: EntrepreneurPulse.com.au  Fact Sheet for Healthcare  Providers: IncredibleEmployment.be  This test is not yet approved or cleared by the [place] FDA and has been authorized for detection and/or diagnosis of SARS-CoV-2 by FDA under an Emergency Use Authorization (EUA). This EUA will remain in effect (meaning this test can be used) for the duration of the COVID-19 declaration under Section 564(b)(1) of the Act, 21 U.S.C. section 360bbb-3(b)(1), unless the authorization is terminated or revoked.  Performed at [facility], [place] [address], [place], [place] [address]   Comprehensive metabolic panel     Status: Abnormal   Collection Time: 07/21/22  5:10 PM  Result Value Ref Range   Sodium 134 (L) 135 - 145 mmol/L   Potassium 2.7 (LL) 3.5 - 5.1 mmol/L    Comment: CRITICAL RESULT CALLED TO, READ BACK BY AND VERIFIED WITH E.ANELLO,RN @1801  07/21/2022 [email or web address].J   Chloride 102 98 - 111 mmol/L   CO2 19 (L) 22 - 32 mmol/L   Glucose, Bld 280 (H) 70 - 99 mg/dL    Comment: Glucose reference range applies only to samples taken after fasting for at least 8 hours.   BUN 24 (H) 8 - 23 mg/dL   Creatinine, Ser 0.82 0.61 - 1.24 mg/dL   Calcium 8.4 (L) 8.9 - 10.3 mg/dL   Total Protein 5.3 (L) 6.5 - 8.1 g/dL   Albumin 3.1 (L) 3.5 - 5.0 g/dL   AST 46 (H) 15 - 41 U/L   ALT 37 0 - 44 U/L   Alkaline Phosphatase 45 38 - 126 U/L   Total Bilirubin 1.0 0.3 - 1.2 mg/dL   GFR, Estimated >60 >60 mL/min    Comment: (NOTE) Calculated using the CKD-EPI Creatinine Equation (2021)    Anion gap 13 5 - 15    Comment: Performed at [facility], [place] [address], [place], [place] [address]  CBC with Differential     Status: Abnormal   Collection Time: 07/21/22  5:10 PM  Result Value Ref Range   WBC 11.7 (H) 4.0 - 10.5 K/uL   RBC 4.52 4.22 - 5.81 MIL/uL   Hemoglobin 13.1 13.0 - 17.0 g/dL   HCT 37.9 (L) 39.0 - 52.0 %   MCV 83.8 80.0 - 100.0 fL   MCH 29.0 26.0 - 34.0 pg   MCHC 34.6 30.0 - 36.0 g/dL   RDW 13.2 11.5 - 15.5 %   Platelets  217 150 - 400 K/uL   nRBC 0.0 0.0 - 0.2 %   Neutrophils Relative % 85 %   Neutro Abs 9.9 (H) 1.7 - 7.7 K/uL   Lymphocytes Relative 8 %   Lymphs Abs 0.9 0.7 - 4.0 K/uL   Monocytes Relative 6 %   Monocytes Absolute 0.6 0.1 - 1.0 K/uL   Eosinophils Relative 0 %   Eosinophils Absolute 0.0 0.0 - 0.5 K/uL   Basophils Relative 0 %   Basophils Absolute 0.1 0.0 - 0.1 K/uL   Immature Granulocytes 1 %   Abs Immature Granulocytes 0.12 (H) 0.00 - 0.07 K/uL    Comment: Performed at [facility] [address] [place], [place] [address]  Troponin I (High Sensitivity)     Status: None   Collection Time:  07/21/22  5:10 PM  Result Value Ref Range   Troponin I (High Sensitivity) 10 <18 ng/L    Comment: (NOTE) Elevated high sensitivity troponin I (hsTnI) values and significant  changes across serial measurements may suggest ACS but many other  chronic and acute conditions are known to elevate hsTnI results.  Refer to the "Links" section for chest pain algorithms and additional  guidance. Performed at [facility], [place] [address] [place], [place] [address]   Protime-INR     Status: None   Collection Time: 07/21/22  5:10 PM  Result Value Ref Range   Prothrombin Time 14.8 11.4 - 15.2 seconds   INR 1.2 0.8 - 1.2    Comment: (NOTE) INR goal varies based on device and disease states. Performed at [facility], [place] [address] [place], [place] [address]   Magnesium     Status: Abnormal   Collection Time: 07/21/22  5:10 PM  Result Value Ref Range   Magnesium 1.6 (L) 1.7 - 2.4 mg/dL    Comment: Performed at [facility] [address], [place], [place] [address]  Hemoglobin A1c     Status: Abnormal   Collection Time: 07/21/22  5:10 PM  Result Value Ref Range   Hgb A1c MFr Bld 8.0 (H) 4.8 - 5.6 %    Comment: (NOTE) Pre diabetes:          5.7%-6.4%  Diabetes:              >6.4%  Glycemic control for   <7.0% adults with diabetes    Mean Plasma Glucose 182.9 mg/dL     Comment: Performed at [facility] [address], [place], [place] [address]  D-dimer, quantitative     Status: Abnormal   Collection Time: 07/21/22  5:10 PM  Result Value Ref Range   D-Dimer, Quant 1.43 (H) 0.00 - 0.50 ug/mL-FEU    Comment: (NOTE) At the manufacturer cut-off value of 0.5 g/mL FEU, this assay has a negative predictive value of 95-100%.This assay is intended for use in conjunction with a clinical pretest probability (PTP) assessment model to exclude pulmonary embolism (PE) and deep venous thrombosis (DVT) in outpatients suspected of PE or DVT. Results should be correlated with clinical presentation. Performed at [facility], [place] [address], [place], [place] [address]   Troponin I (High Sensitivity)     Status: None   Collection Time: 07/21/22  6:26 PM  Result Value Ref Range   Troponin I (High Sensitivity) 4 <18 ng/L    Comment: (NOTE) Elevated high sensitivity troponin I (hsTnI) values and significant  changes across serial measurements may suggest ACS but many other  chronic and acute conditions are known to elevate hsTnI results.  Refer to the "Links" section for chest pain algorithms and additional  guidance. Performed at [facility], [place] [address], [place],  [address]   Urinalysis, Routine w reflex microscopic -Urine, Clean Catch     Status: Abnormal   Collection Time: 07/21/22  6:50 PM  Result Value Ref Range   Color, Urine YELLOW YELLOW   APPearance CLEAR CLEAR   Specific Gravity, Urine 1.023 1.005 - 1.030   pH 6.0 5.0 - 8.0   Glucose, UA >=500 (A) NEGATIVE mg/dL   Hgb urine dipstick NEGATIVE NEGATIVE   Bilirubin Urine NEGATIVE NEGATIVE   Ketones, ur 5 (A) NEGATIVE mg/dL   Protein, ur NEGATIVE NEGATIVE mg/dL   Nitrite NEGATIVE NEGATIVE  Leukocytes,Ua NEGATIVE NEGATIVE   RBC / HPF 0-5 0 - 5 RBC/hpf   WBC, UA 0-5 0 - 5 WBC/hpf   Bacteria, UA NONE SEEN NONE SEEN   Squamous Epithelial / HPF 0-5 0 - 5 /HPF    Comment:  Performed at [facility], [place] [address], [place], [place] [address]   CT Cervical Spine Wo Contrast  Result Date: 07/21/2022 CLINICAL DATA:  [place], loss of consciousness EXAM: CT CERVICAL SPINE WITHOUT CONTRAST TECHNIQUE: Multidetector CT imaging of the cervical spine was performed without intravenous contrast. Multiplanar CT image reconstructions were also generated. RADIATION DOSE REDUCTION: This exam was performed according to the departmental dose-optimization program which includes automated exposure control, adjustment of the mA and/or kV according to patient size and/or use of iterative reconstruction technique. COMPARISON:  05/21/2022 FINDINGS: Alignment: Alignment is grossly anatomic. Skull base and vertebrae: Postsurgical changes at C4-5. There are no acute or destructive bony abnormalities. Soft tissues and spinal canal: No prevertebral fluid or swelling. No visible canal hematoma. Prominent atherosclerosis of the carotid bifurcations. Disc levels: Prior ACDF at the C4-5 level. There is bony fusion across the right C3-4 and C4-5 facet joints. Marked spondylosis at C5-6 and C6-7, with left greater than right neural foraminal encroachment. Upper chest: Airway is patent.  Lung apices are clear. Other: Reconstructed images demonstrate no additional findings. IMPRESSION: 1. No acute cervical spine fracture. 2. Prior C4-5 ACDF, with multilevel spondylosis and facet hypertrophy as above. Electronically Signed   By: Randa Ngo M.D.   On: 07/21/2022 18:02   CT Head Wo Contrast  Result Date: 07/21/2022 CLINICAL DATA:  [place], loss of consciousness EXAM: CT HEAD WITHOUT CONTRAST TECHNIQUE: Contiguous axial images were obtained from the base of the skull through the vertex without intravenous contrast. RADIATION DOSE REDUCTION: This exam was performed according to the departmental dose-optimization program which includes automated exposure control, adjustment of the mA and/or kV according to patient  size and/or use of iterative reconstruction technique. COMPARISON:  06/17/2022 FINDINGS: Brain: No acute infarct or hemorrhage. Lateral ventricles and midline structures are unremarkable. No acute extra-axial fluid collections. No mass effect. Vascular: No hyperdense vessel or unexpected calcification. Skull: Normal. Negative for fracture or focal lesion. Sinuses/Orbits: No acute finding. Other: None. IMPRESSION: 1. Stable head CT, no acute intracranial process. Electronically Signed   By: Randa Ngo M.D.   On: 07/21/2022 17:59   DG Ribs Unilateral W/Chest Left  Result Date: 07/21/2022 CLINICAL DATA:  Multiple falls today with left-sided chest pain, initial encounter EXAM: LEFT RIBS AND CHEST - 3+ VIEW COMPARISON:  05/21/2022 FINDINGS: Check shadow is within normal limits. Aortic calcifications are seen. The lungs are hypoinflated. No infiltrate, effusion or pneumothorax is seen. Undisplaced posterolateral left eighth rib fracture is seen. No other fractures are seen. IMPRESSION: Left eighth rib fracture as described without complicating factors. Electronically Signed   By: [place] Catalina M.D.   On: 07/21/2022 17:59    Pending Labs Unresulted Labs (From admission, onward)     Start     Ordered   07/22/22 0500  Prealbumin  Tomorrow morning,   R        07/21/22 1907   07/22/22 1027  Basic metabolic panel  Now then every 6 hours,   R      07/21/22 1849   07/21/22 1907  CK  Add-on,   AD        07/21/22 1907   07/21/22 1907  Lactic acid, plasma  STAT Now then  every 3 hours,   R     Question:  Release to patient  Answer:  Immediate   07/21/22 1907   07/21/22 1907  Phosphorus  Add-on,   AD        07/21/22 1907   07/21/22 1907  TSH  Add-on,   AD        07/21/22 1907   07/21/22 1907  Urinalysis, Complete w Microscopic -Urine, Clean Catch  Once,   R       Question Answer Comment  Release to patient Immediate   Specimen Source Urine, Clean Catch      07/21/22 1907   Signed and Held  HIV Antibody  (routine testing w rflx)  (HIV Antibody (Routine testing w reflex) panel)  Once,   R        Signed and Held   Signed and Held  Comprehensive metabolic panel  Tomorrow morning,   R       Question:  Release to patient  Answer:  Immediate   Signed and Held   Signed and Held  CBC  Tomorrow morning,   R       Question:  Release to patient  Answer:  Immediate   Signed and Held   Signed and Held  Magnesium  Tomorrow morning,   R        Signed and Held   Signed and Held  Phosphorus  Tomorrow morning,   R        Signed and Held            Vitals/Pain Today's Vitals   07/21/22 1715 07/21/22 1940 07/21/22 1944 07/21/22 1944  BP: 131/81 115/79    Pulse: (!) 106 (!) 104    Resp: (!) 21     Temp:    98.1 F (36.7 C)  TempSrc:    Oral  SpO2: 93% 91%    Weight:      Height:      PainSc:   5      Isolation Precautions No active isolations  Medications Medications  potassium chloride 10 mEq in 100 mL IVPB (10 mEq Intravenous New Bag/Given 07/21/22 1939)  insulin aspart (novoLOG) injection 0-9 Units (has no administration in time range)  acetaminophen (TYLENOL) tablet 1,000 mg (1,000 mg Oral Given 07/21/22 1833)  magnesium sulfate IVPB 2 g 50 mL (0 g Intravenous Stopped 07/21/22 1939)  potassium chloride SA (KLOR-CON M) CR tablet 60 mEq (60 mEq Oral Given 07/21/22 1832)  ketorolac (TORADOL) 15 MG/ML injection 15 mg (15 mg Intravenous Given 07/21/22 1833)    Mobility walks with person assist     Focused Assessments Cardiac Assessment Handoff:  Cardiac Rhythm: Sinus tachycardia Lab Results  Component Value Date   CKTOTAL 332 05/21/2022   Lab Results  Component Value Date   DDIMER 1.43 (H) 07/21/2022   Does the Patient currently have chest pain? No   , Pulmonary Assessment Handoff:  Lung sounds:          R Recommendations: See Admitting Provider Note  Report given to:   Additional Notes:

## 2022-07-21 NOTE — ED Notes (Signed)
Md notified about K of 2.7

## 2022-07-21 NOTE — Assessment & Plan Note (Signed)
Replace and recheck in a.m. 

## 2022-07-21 NOTE — Assessment & Plan Note (Signed)
Will replace ?

## 2022-07-21 NOTE — Subjective & Objective (Signed)
Pt with hx of unsteady gate and falls followed by Neurology Last fall was in December 2023 Presents with 3 what appears to be syncopal episodes happened today.  He has fallen 3 times hitting his back and head no prodrome no chest pain or shortness of breath no nausea no vomiting He did felt a little dizzy 1 time that he has fallen No localizing neurological deficits Reports 1 fall occurred while sitting on the bed and the other 1 occurred later walking into the kitchen patient first reported it seems to be sudden onset.  Every time he woke up on the floor.  Have left-sided chest pain which only started after his fall.  And this pleuritic prior to fall no chest pains.

## 2022-07-21 NOTE — Assessment & Plan Note (Signed)
Order ssi °

## 2022-07-22 ENCOUNTER — Inpatient Hospital Stay (HOSPITAL_COMMUNITY): Payer: Medicare Other

## 2022-07-22 ENCOUNTER — Observation Stay (HOSPITAL_COMMUNITY): Payer: Medicare Other

## 2022-07-22 ENCOUNTER — Inpatient Hospital Stay: Payer: Medicare Other

## 2022-07-22 ENCOUNTER — Other Ambulatory Visit: Payer: Self-pay | Admitting: Cardiology

## 2022-07-22 DIAGNOSIS — S0083XA Contusion of other part of head, initial encounter: Secondary | ICD-10-CM | POA: Diagnosis present

## 2022-07-22 DIAGNOSIS — R55 Syncope and collapse: Secondary | ICD-10-CM

## 2022-07-22 DIAGNOSIS — S2242XA Multiple fractures of ribs, left side, initial encounter for closed fracture: Secondary | ICD-10-CM | POA: Diagnosis not present

## 2022-07-22 DIAGNOSIS — W19XXXA Unspecified fall, initial encounter: Secondary | ICD-10-CM | POA: Diagnosis present

## 2022-07-22 DIAGNOSIS — R112 Nausea with vomiting, unspecified: Secondary | ICD-10-CM | POA: Diagnosis not present

## 2022-07-22 DIAGNOSIS — G8929 Other chronic pain: Secondary | ICD-10-CM | POA: Diagnosis present

## 2022-07-22 DIAGNOSIS — I10 Essential (primary) hypertension: Secondary | ICD-10-CM | POA: Diagnosis not present

## 2022-07-22 DIAGNOSIS — Z9989 Dependence on other enabling machines and devices: Secondary | ICD-10-CM | POA: Diagnosis not present

## 2022-07-22 DIAGNOSIS — I480 Paroxysmal atrial fibrillation: Secondary | ICD-10-CM | POA: Diagnosis not present

## 2022-07-22 DIAGNOSIS — I493 Ventricular premature depolarization: Secondary | ICD-10-CM | POA: Diagnosis present

## 2022-07-22 DIAGNOSIS — G4733 Obstructive sleep apnea (adult) (pediatric): Secondary | ICD-10-CM | POA: Diagnosis present

## 2022-07-22 DIAGNOSIS — A059 Bacterial foodborne intoxication, unspecified: Secondary | ICD-10-CM | POA: Diagnosis not present

## 2022-07-22 DIAGNOSIS — Y92009 Unspecified place in unspecified non-institutional (private) residence as the place of occurrence of the external cause: Secondary | ICD-10-CM | POA: Diagnosis not present

## 2022-07-22 DIAGNOSIS — G9341 Metabolic encephalopathy: Secondary | ICD-10-CM | POA: Diagnosis not present

## 2022-07-22 DIAGNOSIS — E785 Hyperlipidemia, unspecified: Secondary | ICD-10-CM | POA: Diagnosis present

## 2022-07-22 DIAGNOSIS — E871 Hypo-osmolality and hyponatremia: Secondary | ICD-10-CM | POA: Diagnosis not present

## 2022-07-22 DIAGNOSIS — Z794 Long term (current) use of insulin: Secondary | ICD-10-CM | POA: Diagnosis not present

## 2022-07-22 DIAGNOSIS — E1140 Type 2 diabetes mellitus with diabetic neuropathy, unspecified: Secondary | ICD-10-CM | POA: Diagnosis not present

## 2022-07-22 DIAGNOSIS — E876 Hypokalemia: Secondary | ICD-10-CM | POA: Diagnosis present

## 2022-07-22 DIAGNOSIS — K760 Fatty (change of) liver, not elsewhere classified: Secondary | ICD-10-CM | POA: Diagnosis not present

## 2022-07-22 DIAGNOSIS — I951 Orthostatic hypotension: Secondary | ICD-10-CM | POA: Diagnosis present

## 2022-07-22 DIAGNOSIS — Z6837 Body mass index (BMI) 37.0-37.9, adult: Secondary | ICD-10-CM | POA: Diagnosis not present

## 2022-07-22 DIAGNOSIS — Z1152 Encounter for screening for COVID-19: Secondary | ICD-10-CM | POA: Diagnosis not present

## 2022-07-22 DIAGNOSIS — K209 Esophagitis, unspecified without bleeding: Secondary | ICD-10-CM | POA: Diagnosis not present

## 2022-07-22 DIAGNOSIS — E860 Dehydration: Secondary | ICD-10-CM | POA: Diagnosis present

## 2022-07-22 DIAGNOSIS — E669 Obesity, unspecified: Secondary | ICD-10-CM | POA: Diagnosis not present

## 2022-07-22 DIAGNOSIS — R933 Abnormal findings on diagnostic imaging of other parts of digestive tract: Secondary | ICD-10-CM | POA: Diagnosis not present

## 2022-07-22 LAB — COMPREHENSIVE METABOLIC PANEL
ALT: 31 U/L (ref 0–44)
AST: 26 U/L (ref 15–41)
Albumin: 3.1 g/dL — ABNORMAL LOW (ref 3.5–5.0)
Alkaline Phosphatase: 46 U/L (ref 38–126)
Anion gap: 8 (ref 5–15)
BUN: 22 mg/dL (ref 8–23)
CO2: 28 mmol/L (ref 22–32)
Calcium: 9 mg/dL (ref 8.9–10.3)
Chloride: 101 mmol/L (ref 98–111)
Creatinine, Ser: 0.97 mg/dL (ref 0.61–1.24)
GFR, Estimated: >60 mL/min (ref >60)
Glucose, Bld: 166 mg/dL — ABNORMAL HIGH (ref 70–99)
Potassium: 3.6 mmol/L (ref 3.5–5.1)
Sodium: 137 mmol/L (ref 135–145)
Total Bilirubin: 0.7 mg/dL (ref 0.3–1.2)
Total Protein: 5.4 g/dL — ABNORMAL LOW (ref 6.5–8.1)

## 2022-07-22 LAB — BASIC METABOLIC PANEL
Anion gap: 12 (ref 5–15)
BUN: 24 mg/dL — ABNORMAL HIGH (ref 8–23)
CO2: 21 mmol/L — ABNORMAL LOW (ref 22–32)
Calcium: 8.9 mg/dL (ref 8.9–10.3)
Chloride: 100 mmol/L (ref 98–111)
Creatinine, Ser: 1.02 mg/dL (ref 0.61–1.24)
GFR, Estimated: >60 mL/min (ref >60)
Glucose, Bld: 258 mg/dL — ABNORMAL HIGH (ref 70–99)
Potassium: 3.9 mmol/L (ref 3.5–5.1)
Sodium: 133 mmol/L — ABNORMAL LOW (ref 135–145)

## 2022-07-22 LAB — ECHOCARDIOGRAM COMPLETE
AR max vel: 2.14 cm2
AV Area VTI: 2.25 cm2
AV Area mean vel: 2.1 cm2
AV Mean grad: 3 mmHg
AV Peak grad: 6 mmHg
Ao pk vel: 1.22 m/s
Area-P 1/2: 3.57 cm2
Calc EF: 69 %
Height: 70 in
S' Lateral: 2.8 cm
Single Plane A2C EF: 66.4 %
Single Plane A4C EF: 68.9 %
Weight: 3978.86 oz

## 2022-07-22 LAB — URINALYSIS, COMPLETE (UACMP) WITH MICROSCOPIC
Bacteria, UA: NONE SEEN
Bilirubin Urine: NEGATIVE
Glucose, UA: >=500 mg/dL — AB
Hgb urine dipstick: NEGATIVE
Ketones, ur: NEGATIVE mg/dL
Leukocytes,Ua: NEGATIVE
Nitrite: NEGATIVE
Protein, ur: NEGATIVE mg/dL
Specific Gravity, Urine: 1.036 — ABNORMAL HIGH (ref 1.005–1.030)
pH: 6 (ref 5.0–8.0)

## 2022-07-22 LAB — CBC
HCT: 40.7 % (ref 39.0–52.0)
Hemoglobin: 14.2 g/dL (ref 13.0–17.0)
MCH: 28.9 pg (ref 26.0–34.0)
MCHC: 34.9 g/dL (ref 30.0–36.0)
MCV: 82.7 fL (ref 80.0–100.0)
Platelets: 263 10*3/uL (ref 150–400)
RBC: 4.92 MIL/uL (ref 4.22–5.81)
RDW: 13.2 % (ref 11.5–15.5)
WBC: 11.2 10*3/uL — ABNORMAL HIGH (ref 4.0–10.5)
nRBC: 0 % (ref 0.0–0.2)

## 2022-07-22 LAB — PHOSPHORUS: Phosphorus: 3.6 mg/dL (ref 2.5–4.6)

## 2022-07-22 LAB — LACTIC ACID, PLASMA
Lactic Acid, Venous: 2.4 mmol/L (ref 0.5–1.9)
Lactic Acid, Venous: 1.9 mmol/L (ref 0.5–1.9)

## 2022-07-22 LAB — HIV ANTIBODY (ROUTINE TESTING W REFLEX): HIV Screen 4th Generation wRfx: NONREACTIVE

## 2022-07-22 LAB — GLUCOSE, CAPILLARY
Glucose-Capillary: 157 mg/dL — ABNORMAL HIGH (ref 70–99)
Glucose-Capillary: 183 mg/dL — ABNORMAL HIGH (ref 70–99)
Glucose-Capillary: 196 mg/dL — ABNORMAL HIGH (ref 70–99)
Glucose-Capillary: 237 mg/dL — ABNORMAL HIGH (ref 70–99)
Glucose-Capillary: 261 mg/dL — ABNORMAL HIGH (ref 70–99)
Glucose-Capillary: 289 mg/dL — ABNORMAL HIGH (ref 70–99)

## 2022-07-22 LAB — PREALBUMIN: Prealbumin: 22 mg/dL (ref 18–38)

## 2022-07-22 LAB — MAGNESIUM: Magnesium: 2.1 mg/dL (ref 1.7–2.4)

## 2022-07-22 LAB — BRAIN NATRIURETIC PEPTIDE: B Natriuretic Peptide: 67.8 pg/mL (ref 0.0–100.0)

## 2022-07-22 MED ORDER — SENNA 8.6 MG PO TABS
2.0000 | ORAL_TABLET | Freq: Every day | ORAL | Status: DC
  Administered 2022-07-22 – 2022-07-28 (×5): 17.2 mg via ORAL
  Filled 2022-07-22 (×6): qty 2

## 2022-07-22 MED ORDER — ENOXAPARIN SODIUM 60 MG/0.6ML IJ SOSY
60.0000 mg | PREFILLED_SYRINGE | Freq: Every day | INTRAMUSCULAR | Status: DC
  Administered 2022-07-22: 60 mg via SUBCUTANEOUS
  Filled 2022-07-22: qty 0.6

## 2022-07-22 MED ORDER — SODIUM CHLORIDE 0.9 % IV SOLN: INTRAVENOUS | Status: AC

## 2022-07-22 MED ORDER — PERFLUTREN LIPID MICROSPHERE
1.0000 mL | INTRAVENOUS | Status: AC | PRN
  Administered 2022-07-22: 4 mL via INTRAVENOUS

## 2022-07-22 MED ORDER — DOCUSATE SODIUM 100 MG PO CAPS: 200.0000 mg | ORAL_CAPSULE | Freq: Two times a day (BID) | ORAL | Status: DC

## 2022-07-22 NOTE — Evaluation (Signed)
Physical Therapy Evaluation Patient Details Name: Alan Lin MRN: [id number] DOB: [birth year] Today's Date: 07/22/2022  History of Present Illness  67 y/o M admitted on 2/1 with falls and loss of consciousness. Pt had 3 episodes of syncope, imaging reveals fractures of L 6, 7, 8 ribs. PMHx: DM, HTN, falls, unsteady gait followed by neurology.  Clinical Impression  Pt presents today after falls at home, now with L rib fractures, impairing mobility. At baseline pt utilizes a rollator or [facility] for ambulation, attending OPPT for balance and gait training. Pt required modA for bed mobility and sit>stand today, mainly limited by pain in ribs with movement, education on technique and breathing pattern provided. Pt only requires minA and RW for pivot to bedside chair, ambulation deferred due to increase pain with mobility, but step-pivot transfer performed. Pt will continue to benefit from skilled acute PT at this time to progress mobility tolerance and safety, recommending HHPT at this time with support from friends and pending progression, acute PT will continue to follow as appropriate.        Recommendations for follow up therapy are one component of a multi-disciplinary discharge planning process, led by the attending physician.  Recommendations may be updated based on patient status, additional functional criteria and insurance authorization.  Follow Up Recommendations Home health PT      Assistance Recommended at Discharge Frequent or constant Supervision/Assistance  Patient can return home with the following  A little help with walking and/or transfers;Assist for transportation    Equipment Recommendations None recommended by PT  Recommendations for Other Services       Functional Status Assessment Patient has had a recent decline in their functional status and demonstrates the ability to make significant improvements in function in a reasonable and predictable amount of time.      Precautions / Restrictions Precautions Precautions: Fall Precaution Comments: L rib fx's Required Braces or Orthoses:  (pt has a soft cervical collar he uses intermittently for comfort) Restrictions Weight Bearing Restrictions: No      Mobility  Bed Mobility Overal bed mobility: Needs Assistance Bed Mobility: Supine to Sit     Supine to sit: Mod assist, HOB elevated     General bed mobility comments: use of bedrails, assist required for technique and sequencing, as well as trunk support into sitting    Transfers Overall transfer level: Needs assistance Equipment used: Rolling walker (2 wheels) Transfers: Sit to/from Stand, Bed to chair/wheelchair/BSC Sit to Stand: Mod assist   Step pivot transfers: Min assist       General transfer comment: pt requiring modA for sit>stand for power up and balance, minA for stand>sit and for pivot to bedside chair    Ambulation/Gait                  Stairs            Wheelchair Mobility    Modified Rankin (Stroke Patients Only)       Balance Overall balance assessment: Needs assistance Sitting-balance support: Feet supported, Single extremity supported Sitting balance-Leahy Scale: Fair Sitting balance - Comments: UE support with sitting balance, supervision for safety, no sway noted but pt moving intermittently due to pain   Standing balance support: Reliant on assistive device for balance, During functional activity Standing balance-Leahy Scale: Poor Standing balance comment: Pt utilizing RW for standing balance during BP as well as pivoting over to chair  Pertinent Vitals/Pain Pain Assessment Pain Assessment: Faces Faces Pain Scale: Hurts whole lot (with mobility, coughing, or sneezing, 0 at rest) Pain Location: ribs Pain Descriptors / Indicators: Sharp Pain Intervention(s): Monitored during session, Limited activity within patient's tolerance, Repositioned    Home  Living Family/patient expects to be discharged to:: Private residence Living Arrangements: Alone Available Help at Discharge: Friend(s);Available PRN/intermittently Type of Home: House Home Access: Ramped entrance       Home Layout: One level Home Equipment: Rollator (4 wheels);Cane - single point (reports cane broke in his fall) Additional Comments: Pt reports he utilizes his rollator in the community, [facility] in his home however it broke with one of his recent falls    Prior Function Prior Level of Function : Independent/Modified Independent;Driving;History of Falls (last six months)             Mobility Comments: pt was attending OPPT for balance and gait, reports 3 falls on the day of admission, last fall ~2 months ago       Hand Dominance   Dominant Hand: Right    Extremity/Trunk Assessment   Upper Extremity Assessment Upper Extremity Assessment: Defer to OT evaluation    Lower Extremity Assessment Lower Extremity Assessment: Overall WFL for tasks assessed    Cervical / Trunk Assessment Cervical / Trunk Assessment: Normal (soft collar used for comfort from home)  Communication   Communication: No difficulties  Cognition Arousal/Alertness: Awake/alert Behavior During Therapy: WFL for tasks assessed/performed Overall Cognitive Status: Within Functional Limits for tasks assessed                                 General Comments: Cooperative throughout session        General Comments General comments (skin integrity, edema, etc.): SPO2 stable on room air throughout session, BP in sitting: 143/90(106), sitting: 140/79(98), standing 151/83(101), asymptomatic throughout    Exercises     Assessment/Plan    PT Assessment Patient needs continued PT services  PT Problem List Decreased activity tolerance;Decreased balance;Decreased mobility;Decreased safety awareness;Cardiopulmonary status limiting activity       PT Treatment Interventions DME  instruction;Gait training;Functional mobility training;Therapeutic activities;Therapeutic exercise;Balance training;Neuromuscular re-education;Patient/family education    PT Goals (Current goals can be found in the Care Plan section)  Acute Rehab PT Goals Patient Stated Goal: get better PT Goal Formulation: With patient Time For Goal Achievement: 08/05/22 Potential to Achieve Goals: Good    Frequency Min 3X/week     Co-evaluation               AM-PAC PT "6 Clicks" Mobility  Outcome Measure Help needed turning from your back to your side while in a flat bed without using bedrails?: A Lot Help needed moving from lying on your back to sitting on the side of a flat bed without using bedrails?: A Lot Help needed moving to and from a bed to a chair (including a wheelchair)?: A Lot Help needed standing up from a chair using your arms (e.g., wheelchair or bedside chair)?: A Lot Help needed to walk in hospital room?: A Little Help needed climbing 3-5 steps with a railing? : A Lot 6 Click Score: 13    End of Session   Activity Tolerance: Patient tolerated treatment well Patient left: in chair;with call bell/phone within reach;with chair alarm set Nurse Communication: Mobility status PT Visit Diagnosis: Repeated falls (R29.6);Difficulty in walking, not elsewhere classified (R26.2)    Time: 1530-1600  PT Time Calculation (min) (ACUTE ONLY): 30 min   Charges:   PT Evaluation $PT Eval Moderate Complexity: 1 Mod          Alan Lin, PT DPT Acute Rehabilitation Services Office [phone]   Alan Lin 07/22/2022, 4:22 PM

## 2022-07-22 NOTE — Evaluation (Signed)
Occupational Therapy Evaluation Patient Details Name: Alan Lin MRN: [id number] DOB: [birth year] Today's Date: 07/22/2022   History of Present Illness 67 y/o M admitted on 2/1 with falls and loss of consciousness. Pt had 3 episodes of syncope, imaging reveals fractures of L 6, 7, 8 ribs. PMHx: DM, HTN, falls, unsteady gait followed by neurology.   Clinical Impression   Patient admitted for the diagnosis above.  PTA he lives at home alone, cared for his own ADL/iADL, continues to drive, and used a 4WRW in the community, and a SPC in the home.  Patient admits to balance deficits, and increasing falls.  He is undergoing outpatient PT.  Currently he is needing generalized Mod A for mobility and ADL completion due to rib pain.  OT will follow in the acute setting to address deficits listed below, and assist with transition home with assist from friends.  [facility] can be considered, depending on progress.            Recommendations for follow up therapy are one component of a multi-disciplinary discharge planning process, led by the attending physician.  Recommendations may be updated based on patient status, additional functional criteria and insurance authorization.   Follow Up Recommendations  Home health OT     Assistance Recommended at Discharge Intermittent Supervision/Assistance  Patient can return home with the following Assist for transportation;Assistance with cooking/housework    Functional Status Assessment  Patient has had a recent decline in their functional status and demonstrates the ability to make significant improvements in function in a reasonable and predictable amount of time.  Equipment Recommendations  BSC/3in1    Recommendations for Other Services       Precautions / Restrictions Precautions Precautions: Fall Precaution Comments: L rib fx's Restrictions Weight Bearing Restrictions: No      Mobility Bed Mobility Overal bed mobility: Needs Assistance Bed  Mobility: Supine to Sit, Sit to Supine     Supine to sit: Mod assist Sit to supine: Mod assist        Transfers Overall transfer level: Needs assistance Equipment used: Rolling walker (2 wheels) Transfers: Sit to/from Stand, Bed to chair/wheelchair/BSC Sit to Stand: Mod assist     Step pivot transfers: Min assist            Balance Overall balance assessment: Needs assistance Sitting-balance support: Feet supported Sitting balance-Leahy Scale: Fair     Standing balance support: Reliant on assistive device for balance Standing balance-Leahy Scale: Poor                             ADL either performed or assessed with clinical judgement   ADL Overall ADL's : Needs assistance/impaired Eating/Feeding: Independent;Bed level   Grooming: Wash/dry hands;Wash/dry face;Set up;Sitting   Upper Body Bathing: Minimal assistance;Sitting   Lower Body Bathing: Moderate assistance;Sit to/from stand   Upper Body Dressing : Minimal assistance;Sitting   Lower Body Dressing: Moderate assistance;Sit to/from stand   Toilet Transfer: Minimal assistance;Moderate assistance;Stand-pivot;BSC/3in1                   Vision Patient Visual Report: No change from baseline       Perception     Praxis      Pertinent Vitals/Pain Pain Assessment Pain Assessment: Faces Faces Pain Scale: Hurts whole lot Pain Descriptors / Indicators: Sharp     Hand Dominance Right   Extremity/Trunk Assessment Upper Extremity Assessment Upper Extremity Assessment: Overall WFL for  tasks assessed   Lower Extremity Assessment Lower Extremity Assessment: Defer to PT evaluation   Cervical / Trunk Assessment Cervical / Trunk Assessment: Normal   Communication Communication Communication: No difficulties   Cognition Arousal/Alertness: Awake/alert Behavior During Therapy: WFL for tasks assessed/performed Overall Cognitive Status: Within Functional Limits for tasks assessed                                        General Comments   HR 96-101    Exercises     Shoulder Instructions      Home Living Family/patient expects to be discharged to:: Private residence Living Arrangements: Alone Available Help at Discharge: Friend(s);Available PRN/intermittently Type of Home: House Home Access: Ramped entrance     Home Layout: One level     Bathroom Shower/Tub: Occupational psychologist: Handicapped height Bathroom Accessibility: Yes How Accessible: Accessible via walker Home Equipment: Rollator (4 wheels);Cane - single point          Prior Functioning/Environment Prior Level of Function : Independent/Modified Independent;Driving                        OT Problem List: Decreased range of motion;Decreased activity tolerance;Impaired balance (sitting and/or standing);Pain      OT Treatment/Interventions: Self-care/ADL training;Therapeutic activities;Patient/family education;Balance training;DME and/or AE instruction    OT Goals(Current goals can be found in the care plan section) Acute Rehab OT Goals Patient Stated Goal: Return home with help from my friends OT Goal Formulation: With patient Time For Goal Achievement: 08/05/22 Potential to Achieve Goals: Good ADL Goals Pt Will Perform Grooming: with supervision;standing Pt Will Perform Lower Body Dressing: with supervision;sit to/from stand Pt Will Transfer to Toilet: with supervision;ambulating;regular height toilet  OT Frequency: Min 2X/week    Co-evaluation              AM-PAC OT "6 Clicks" Daily Activity     Outcome Measure Help from another person eating meals?: None Help from another person taking care of personal grooming?: A Little Help from another person toileting, which includes using toliet, bedpan, or urinal?: A Lot Help from another person bathing (including washing, rinsing, drying)?: A Lot Help from another person to put on and taking off regular  upper body clothing?: A Little Help from another person to put on and taking off regular lower body clothing?: A Lot 6 Click Score: 16   End of Session Equipment Utilized During Treatment: Rolling walker (2 wheels) Nurse Communication: Mobility status  Activity Tolerance: Patient limited by pain Patient left: in bed;with call bell/phone within reach  OT Visit Diagnosis: Unsteadiness on feet (R26.81);Muscle weakness (generalized) (M62.81);Pain Pain - part of body:  (ribs)                Time: 2956-2130 OT Time Calculation (min): 19 min Charges:  OT General Charges $OT Visit: 1 Visit OT Evaluation $OT Eval Moderate Complexity: 1 Mod  07/22/2022  RP, OTR/L  Acute Rehabilitation Services  Office:  [phone]   Metta Clines 07/22/2022, 11:44 AM

## 2022-07-22 NOTE — Care Management Obs Status (Signed)
[facility] NOTIFICATION   Patient Details  Name: Alan Lin MRN: [id number] Date of Birth: [birth year]   Medicare Observation Status Notification Given:  Yes    Levonne Lapping, RN 07/22/2022, 9:16 AM

## 2022-07-22 NOTE — Progress Notes (Signed)
EEG complete - results pending 

## 2022-07-22 NOTE — Progress Notes (Signed)
Monitor requested by hospitalist for evaluation of syncope. Patient has not been seen by heartcare in the past. Monitor ordered, will be read by Dr. Sallyanne Kuster (doc of the day)

## 2022-07-22 NOTE — Procedures (Signed)
Patient Name: Alan Lin  MRN: [id number]  Epilepsy Attending: Lora Havens  Referring Physician/Provider: Thurnell Lose, MD  Date: 07/22/2022 Duration: 25.06 mins  Patient history: 67yo M with syncope. EEG to evaluate for seizure  Level of alertness: Awake  AEDs during EEG study: None  Technical aspects: This EEG study was done with scalp electrodes positioned according to the 10-20 International system of electrode placement. Electrical activity was reviewed with band pass filter of 1-70Hz , sensitivity of 7 uV/mm, display speed of 66mm/sec with a 60Hz  notched filter applied as appropriate. EEG data were recorded continuously and digitally stored.  Video monitoring was available and reviewed as appropriate.  Description: The posterior dominant rhythm consists of 9 Hz activity of moderate voltage (25-35 uV) seen predominantly in posterior head regions, symmetric and reactive to eye opening and eye closing. Physiologic photic driving was seen during photic stimulation.  Hyperventilation was not performed.     IMPRESSION: This study is within normal limits. No seizures or epileptiform discharges were seen throughout the recording.  A normal interictal EEG does not exclude  the diagnosis of epilepsy.  [name] [name]

## 2022-07-22 NOTE — Progress Notes (Signed)
  Echocardiogram 2D Echocardiogram has been performed.  Alan Lin 07/22/2022, 9:45 AM

## 2022-07-22 NOTE — Progress Notes (Signed)
ANTICOAGULATION CONSULT NOTE  Pharmacy Consult for enoxaparin  Indication: VTE prophylaxis  Allergies  Allergen Reactions   Metformin Diarrhea and Nausea And Vomiting   Oxycodone Other (See Comments)    Constipation   Oxycodone-Acetaminophen     Constipation   Lisinopril Cough   Morphine Itching    Other reaction(s): Other (See Comments)  Constipation   Morphine And Related Itching    Patient Measurements: Height: 5\' 10"  (177.8 cm) Weight: 112.8 kg (248 lb 10.9 oz) IBW/kg (Calculated) : 73  Vital Signs: Temp: 97.6 F (36.4 C) (02/02 0800) Temp Source: Oral (02/02 0800) BP: 117/68 (02/02 0829) Pulse Rate: 95 (02/02 1000)  Labs: Recent Labs    07/21/22 1710 07/21/22 1826 07/22/22 0135 07/22/22 0702  HGB 13.1  --   --  14.2  HCT 37.9*  --   --  40.7  PLT 217  --   --  263  LABPROT 14.8  --   --   --   INR 1.2  --   --   --   CREATININE 0.82  --  1.02 0.97  CKTOTAL 49  --   --   --   TROPONINIHS 10 4  --   --     Estimated Creatinine Clearance: 94.2 mL/min (by C-G formula based on SCr of 0.97 mg/dL).   Assessment: 67 y.o. male not on anticoagulation PTA who presented after syncopal episode now with fractures on L rib cage. Pharmacy consulted to add enoxaparin for VTE ppx. BMI 36. Enoxaparin dose 0.5mg /kg = 55 kg. Will round up for syringe size.  CBC stable, no issues bleeding reported.  Goal of Therapy:  Monitor platelets by anticoagulation protocol: Yes   Plan:  Enoxaparin 60mg  SQ once daily Continue to monitor H&H and platelets  Thank you for allowing pharmacy to be a part of this patient's care.  Ardyth Harps, PharmD Clinical Pharmacist

## 2022-07-22 NOTE — Progress Notes (Signed)
Carotid duplex has been completed.   Results can be found under chart review under CV PROC. 07/22/2022 4:44 PM Worley Radermacher RVT, RDMS

## 2022-07-22 NOTE — Progress Notes (Unsigned)
Enrolled patient for a 14 day Zio XT monitor to be mailed to patients home  Dr Croitoru to read 

## 2022-07-22 NOTE — Progress Notes (Signed)
PROGRESS NOTE                                                                                                                                                                                                             Patient Demographics:    Alan Lin, is a 67 y.o. male, DOB - April 08, 1956, DJS:[id number]  Outpatient Primary MD for the patient is Alan Harps, MD    LOS - 0  Admit date - 07/21/2022    Chief Complaint  Patient presents with   Fall   Loss of Consciousness       Brief Narrative (HPI from H&P)   67 y.o. male with medical history significant of Dm2, HTN, falls.  Resented to the ER after an episode of diarrhea last night after which she took 1 Imodium, thereafter he went to the bathroom 4 AM in the morning where he syncopized once and twice again thereafter, he was slightly lightheaded during these episodes but did not have any chest pain or shortness of breath.  He recently had a C-spine surgery for which she wears a c-collar, during his falls he did hurt his left-sided rib cage.  Brought to the ER where head CT and C-spine CT were stable, CTA chest was stable, he did have fractures on his left rib cage, he was admitted for further workup for syncope.    Subjective:    Alan Lin today has, No headache, No chest pain, No abdominal pain - No Nausea, No new weakness tingling or numbness, mild left-sided rib cage pain, chronic neck pain   Assessment  & Plan :    Multiple falls after syncope and lightheadedness in a patient with previous history of falls.  Unclear reason but could be due to orthostatic hypotension caused by an episode of diarrhea the night of admission.  CT head unremarkable, no focal deficits, echocardiogram stable, stable on telemetry, he did have some incontinence on one of the episodes hence will get EEG, carotid Doppler ordered upon admission is pending, will check orthostatics and advance  activity with PT OT.  New IV fluids, monitor closely on telemetry, will also arrange for 30-day monitor upon discharge.  Chronic neck pain and C-spine surgery.  Continue c-collar follow-up with orthopedic surgeon postdischarge.  Dehydration with hypomagnesemia, hypophosphatemia and hypokalemia.  IV fluids, replace electrolytes.  Left  rib cage fractures.  Supportive care.  OSA.  CPAP at night.  Hypertension.  Currently blood pressure low.  Hydrate with IV fluids continue to hold blood pressure medications.      Condition - Fair  Family Communication  :  None   Code Status :  Full  Consults  :  Cards  PUD Prophylaxis :    Procedures  :     EEG -  Carotid Doppler.    TTE - 1. Windows not well visualized. Cannot assess wall motion. Left ventricular ejection fraction, by estimation, is 60 to 65%. The left ventricle has normal function.  2. Right ventricular systolic function is normal. The right ventricular size is normal.  3. No evidence of mitral valve regurgitation.  4. Aortic valve regurgitation is not visualized.  5. The inferior vena cava is normal in size with greater than 50% respiratory variability, suggesting right atrial pressure of 3 mmHg.  CTA -  Fractures of the left sixth, seventh and eighth ribs without complicating factors. No evidence of pulmonary emboli. Mild right basilar atelectasis. Aortic Atherosclerosis  CT Head - Non acute  CT C Spine - 1. No acute cervical spine fracture. 2. Prior C4-5 ACDF, with multilevel spondylosis and facet hypertrophy as above.       Disposition Plan  :    Status is: Observation  DVT Prophylaxis  :    SCDs Start: 07/21/22 2209     Lab Results  Component Value Date   PLT 263 07/22/2022    Diet :  Diet Order             Diet Carb Modified Fluid consistency: Thin; Room service appropriate? Yes  Diet effective now                    Inpatient Medications  Scheduled Meds:  DULoxetine  40 mg Oral Daily    gabapentin  300 mg Oral Daily   insulin aspart  0-9 Units Subcutaneous Q4H   simvastatin  20 mg Oral Daily   Continuous Infusions:  sodium chloride     PRN Meds:.acetaminophen **OR** acetaminophen, perflutren lipid microspheres (DEFINITY) IV suspension, traMADol  Antibiotics  :    Anti-infectives (From admission, onward)    None         Objective:   Vitals:   07/22/22 0800 07/22/22 0829 07/22/22 0900 07/22/22 1000  BP: 117/68 117/68    Pulse: 87  90 95  Resp: 14  13 16   Temp: 97.6 F (36.4 C)     TempSrc: Oral     SpO2: 91%  93% 93%  Weight:      Height:        Wt Readings from Last 3 Encounters:  07/21/22 112.8 kg  07/05/22 117.9 kg  05/21/22 117.9 kg     Intake/Output Summary (Last 24 hours) at 07/22/2022 1055 Last data filed at 07/22/2022 0834 Gross per 24 hour  Intake --  Output 1500 ml  Net -1500 ml     Physical Exam  Awake Alert, No new F.N deficits, Normal affect Alan Lin.AT,PERRAL Supple Neck, No JVD, C Collar in place  Symmetrical Chest wall movement, Good air movement bilaterally, CTAB RRR,No Gallops,Rubs or new Murmurs,  +ve B.Sounds, Abd Soft, No tenderness,   No Cyanosis, Clubbing or edema      Data Review:    Recent Labs  Lab 07/21/22 1710 07/22/22 0702  WBC 11.7* 11.2*  HGB 13.1 14.2  HCT 37.9* 40.7  PLT 217  263  MCV 83.8 82.7  MCH 29.0 28.9  MCHC 34.6 34.9  RDW 13.2 13.2  LYMPHSABS 0.9  --   MONOABS 0.6  --   EOSABS 0.0  --   BASOSABS 0.1  --     Recent Labs  Lab 07/21/22 1710 07/21/22 1907 07/21/22 1940 07/21/22 2216 07/22/22 0135 07/22/22 0434 07/22/22 0702 07/22/22 0704  NA 134*  --   --   --  133*  --  137  --   K 2.7*  --   --   --  3.9  --  3.6  --   CL 102  --   --   --  100  --  101  --   CO2 19*  --   --   --  21*  --  28  --   ANIONGAP 13  --   --   --  12  --  8  --   GLUCOSE 280*  --   --   --  258*  --  166*  --   BUN 24*  --   --   --  24*  --  22  --   CREATININE 0.82  --   --   --  1.02  --  0.97   --   AST 46*  --   --   --   --   --  26  --   ALT 37  --   --   --   --   --  31  --   ALKPHOS 45  --   --   --   --   --  46  --   BILITOT 1.0  --   --   --   --   --  0.7  --   ALBUMIN 3.1*  --   --   --   --   --  3.1*  --   DDIMER 1.43*  --   --   --   --   --   --   --   LATICACIDVEN  --   --  1.7 2.2* 2.4* 1.9  --   --   INR 1.2  --   --   --   --   --   --   --   TSH  --  0.453  --   --   --   --   --   --   HGBA1C 8.0*  --   --   --   --   --   --   --   BNP  --   --   --   --   --   --   --  67.8  MG 1.6*  --   --   --   --   --  2.1  --   CALCIUM 8.4*  --   --   --  8.9  --  9.0  --       Recent Labs  Lab 07/21/22 1710 07/21/22 1907 07/21/22 1940 07/21/22 2216 07/22/22 0135 07/22/22 0434 07/22/22 0702 07/22/22 0704  DDIMER 1.43*  --   --   --   --   --   --   --   LATICACIDVEN  --   --  1.7 2.2* 2.4* 1.9  --   --   INR 1.2  --   --   --   --   --   --   --  TSH  --  0.453  --   --   --   --   --   --   HGBA1C 8.0*  --   --   --   --   --   --   --   BNP  --   --   --   --   --   --   --  67.8  MG 1.6*  --   --   --   --   --  2.1  --   CALCIUM 8.4*  --   --   --  8.9  --  9.0  --       Lab Results  Component Value Date   HGBA1C 8.0 (H) 07/21/2022    Recent Labs    07/21/22 1907  TSH 0.453   Radiology Reports ECHOCARDIOGRAM COMPLETE  Result Date: 07/22/2022    ECHOCARDIOGRAM REPORT   Patient Name:   Tore ELYAN VANWIEREN Date of Exam: 07/22/2022 Medical Rec #:  [id number]        Height:       70.0 in Accession #:    [id number]       Weight:       248.7 lb Date of Birth:  19-Nov-1955         BSA:          2.290 m Patient Age:    89 years         BP:           117/68 mmHg Patient Gender: M                HR:           89 bpm. Exam Location:  Inpatient Procedure: 2D Echo and Intracardiac Opacification Agent Indications:    syncope  History:        Patient has no prior history of Echocardiogram examinations.                 Risk Factors:Hypertension, Diabetes and  Dyslipidemia.  Sonographer:    Harvie Junior Referring Phys: [id number] ANASTASSIA DOUTOVA  Sonographer Comments: Technically difficult study due to poor echo windows, suboptimal apical window, suboptimal parasternal window, suboptimal subcostal window and patient is obese. Image acquisition challenging due to patient body habitus, Image acquisition challenging due to respiratory motion and broken ribs. IMPRESSIONS  1. Windows not well visualized. Cannot assess wall motion. Left ventricular ejection fraction, by estimation, is 60 to 65%. The left ventricle has normal function.  2. Right ventricular systolic function is normal. The right ventricular size is normal.  3. No evidence of mitral valve regurgitation.  4. Aortic valve regurgitation is not visualized.  5. The inferior vena cava is normal in size with greater than 50% respiratory variability, suggesting right atrial pressure of 3 mmHg. FINDINGS  Left Ventricle: Windows not well visualized. Cannot assess wall motion. Left ventricular ejection fraction, by estimation, is 60 to 65%. The left ventricle has normal function. Definity contrast agent was given IV to delineate the left ventricular endocardial borders. The left ventricular internal cavity size was normal in size. Right Ventricle: The right ventricular size is normal. Right ventricular systolic function is normal. Left Atrium: Left atrial size was not well visualized. Right Atrium: Right atrial size was not well visualized. Pericardium: The pericardium was not well visualized. Mitral Valve: No evidence of mitral valve regurgitation. Tricuspid Valve: Tricuspid valve regurgitation is not demonstrated. Aortic Valve: Aortic valve regurgitation is not visualized.  Aortic valve mean gradient measures 3.0 mmHg. Aortic valve peak gradient measures 6.0 mmHg. Aortic valve area, by VTI measures 2.25 cm. Pulmonic Valve: Pulmonic valve regurgitation is not visualized. Aorta: The aortic root is normal in size and structure.  Venous: The inferior vena cava is normal in size with greater than 50% respiratory variability, suggesting right atrial pressure of 3 mmHg. IAS/Shunts: The interatrial septum was not well visualized.  LEFT VENTRICLE PLAX 2D LVIDd:         4.20 cm     Diastology LVIDs:         2.80 cm     LV e' medial:    7.29 cm/s LV PW:         1.00 cm     LV E/e' medial:  7.6 LV IVS:        0.90 cm     LV e' lateral:   8.05 cm/s LVOT diam:     2.30 cm     LV E/e' lateral: 6.9 LV SV:         51 LV SV Index:   22 LVOT Area:     4.15 cm  LV Volumes (MOD) LV vol d, MOD A2C: 75.4 ml LV vol d, MOD A4C: 92.6 ml LV vol s, MOD A2C: 25.3 ml LV vol s, MOD A4C: 28.8 ml LV SV MOD A2C:     50.1 ml LV SV MOD A4C:     92.6 ml LV SV MOD BP:      59.1 ml RIGHT VENTRICLE RV S prime:     16.00 cm/s TAPSE (M-mode): 2.0 cm LEFT ATRIUM           Index LA diam:      3.70 cm 1.62 cm/m LA Vol (A4C): 58.6 ml 25.59 ml/m  AORTIC VALVE                    PULMONIC VALVE AV Area (Vmax):    2.14 cm     PV Vmax:       0.99 m/s AV Area (Vmean):   2.10 cm     PV Peak grad:  3.9 mmHg AV Area (VTI):     2.25 cm AV Vmax:           122.00 cm/s AV Vmean:          81.900 cm/s AV VTI:            0.227 m AV Peak Grad:      6.0 mmHg AV Mean Grad:      3.0 mmHg LVOT Vmax:         62.70 cm/s LVOT Vmean:        41.400 cm/s LVOT VTI:          0.123 m LVOT/AV VTI ratio: 0.54  AORTA Ao Root diam: 3.50 cm MITRAL VALVE MV Area (PHT): 3.57 cm    SHUNTS MV Decel Time: 213 msec    Systemic VTI:  0.12 m MV E velocity: 55.25 cm/s  Systemic Diam: 2.30 cm MV A velocity: 51.45 cm/s MV E/A ratio:  1.07 Landscape architect signed by Phineas Inches Signature Date/Time: 07/22/2022/9:56:01 AM    Final    CT Angio Chest Pulmonary Embolism (PE) W or WO Contrast  Result Date: 07/22/2022 CLINICAL DATA:  Difficulty breathing EXAM: CT ANGIOGRAPHY CHEST WITH CONTRAST TECHNIQUE: Multidetector CT imaging of the chest was performed using the standard protocol during bolus administration of  intravenous contrast. Multiplanar CT image reconstructions  and MIPs were obtained to evaluate the vascular anatomy. RADIATION DOSE REDUCTION: This exam was performed according to the departmental dose-optimization program which includes automated exposure control, adjustment of the mA and/or kV according to patient size and/or use of iterative reconstruction technique. CONTRAST:  66mL OMNIPAQUE IOHEXOL 350 MG/ML SOLN COMPARISON:  Chest x-ray from earlier in the same day. FINDINGS: Cardiovascular: Atherosclerotic calcifications of the thoracic aorta are noted. No aneurysmal dilatation or dissection is noted. No cardiac enlargement is seen. Coronary calcifications are seen. The pulmonary artery shows a normal branching pattern bilaterally. No intraluminal filling defect to suggest pulmonary embolism is seen. Mediastinum/Nodes: Thoracic inlet is within normal limits. No hilar or mediastinal adenopathy is noted. The esophagus is within normal limits. Lungs/Pleura: Minimal right basilar atelectasis is noted. The lungs are otherwise well aerated. No focal infiltrate or effusion is seen. Upper Abdomen: Visualized upper abdomen is within normal limits. Musculoskeletal: Degenerative changes of the thoracic spine are noted. Mildly displaced fractures of the left sixth, seventh and eighth ribs are noted without complicating factors. Review of the MIP images confirms the above findings. IMPRESSION: Fractures of the left sixth, seventh and eighth ribs without complicating factors. No evidence of pulmonary emboli. Mild right basilar atelectasis. Aortic Atherosclerosis (ICD10-I70.0). Electronically Signed   By: Alcide Clever M.D.   On: 07/22/2022 00:00   CT Cervical Spine Wo Contrast  Result Date: 07/21/2022 CLINICAL DATA:  Larey Seat, loss of consciousness EXAM: CT CERVICAL SPINE WITHOUT CONTRAST TECHNIQUE: Multidetector CT imaging of the cervical spine was performed without intravenous contrast. Multiplanar CT image reconstructions  were also generated. RADIATION DOSE REDUCTION: This exam was performed according to the departmental dose-optimization program which includes automated exposure control, adjustment of the mA and/or kV according to patient size and/or use of iterative reconstruction technique. COMPARISON:  05/21/2022 FINDINGS: Alignment: Alignment is grossly anatomic. Skull base and vertebrae: Postsurgical changes at C4-5. There are no acute or destructive bony abnormalities. Soft tissues and spinal canal: No prevertebral fluid or swelling. No visible canal hematoma. Prominent atherosclerosis of the carotid bifurcations. Disc levels: Prior ACDF at the C4-5 level. There is bony fusion across the right C3-4 and C4-5 facet joints. Marked spondylosis at C5-6 and C6-7, with left greater than right neural foraminal encroachment. Upper chest: Airway is patent.  Lung apices are clear. Other: Reconstructed images demonstrate no additional findings. IMPRESSION: 1. No acute cervical spine fracture. 2. Prior C4-5 ACDF, with multilevel spondylosis and facet hypertrophy as above. Electronically Signed   By: Sharlet Salina M.D.   On: 07/21/2022 18:02   CT Head Wo Contrast  Result Date: 07/21/2022 CLINICAL DATA:  Larey Seat, loss of consciousness EXAM: CT HEAD WITHOUT CONTRAST TECHNIQUE: Contiguous axial images were obtained from the base of the skull through the vertex without intravenous contrast. RADIATION DOSE REDUCTION: This exam was performed according to the departmental dose-optimization program which includes automated exposure control, adjustment of the mA and/or kV according to patient size and/or use of iterative reconstruction technique. COMPARISON:  06/17/2022 FINDINGS: Brain: No acute infarct or hemorrhage. Lateral ventricles and midline structures are unremarkable. No acute extra-axial fluid collections. No mass effect. Vascular: No hyperdense vessel or unexpected calcification. Skull: Normal. Negative for fracture or focal lesion.  Sinuses/Orbits: No acute finding. Other: None. IMPRESSION: 1. Stable head CT, no acute intracranial process. Electronically Signed   By: Sharlet Salina M.D.   On: 07/21/2022 17:59   DG Ribs Unilateral W/Chest Left  Result Date: 07/21/2022 CLINICAL DATA:  Multiple falls today with left-sided chest  pain, initial encounter EXAM: LEFT RIBS AND CHEST - 3+ VIEW COMPARISON:  05/21/2022 FINDINGS: Check shadow is within normal limits. Aortic calcifications are seen. The lungs are hypoinflated. No infiltrate, effusion or pneumothorax is seen. Undisplaced posterolateral left eighth rib fracture is seen. No other fractures are seen. IMPRESSION: Left eighth rib fracture as described without complicating factors. Electronically Signed   By: Alcide Clever M.D.   On: 07/21/2022 17:59      Signature  -   Susa Raring M.D on 07/22/2022 at 10:55 AM   -  To page go to [email or web address]

## 2022-07-23 ENCOUNTER — Inpatient Hospital Stay (HOSPITAL_COMMUNITY): Payer: Medicare Other

## 2022-07-23 DIAGNOSIS — K209 Esophagitis, unspecified without bleeding: Secondary | ICD-10-CM | POA: Diagnosis not present

## 2022-07-23 DIAGNOSIS — R112 Nausea with vomiting, unspecified: Secondary | ICD-10-CM | POA: Diagnosis not present

## 2022-07-23 DIAGNOSIS — R55 Syncope and collapse: Secondary | ICD-10-CM | POA: Diagnosis not present

## 2022-07-23 DIAGNOSIS — R933 Abnormal findings on diagnostic imaging of other parts of digestive tract: Secondary | ICD-10-CM

## 2022-07-23 LAB — CBC WITH DIFFERENTIAL/PLATELET
Abs Immature Granulocytes: 0.12 10*3/uL — ABNORMAL HIGH (ref 0.00–0.07)
Basophils Absolute: 0.1 10*3/uL (ref 0.0–0.1)
Basophils Relative: 1 %
Eosinophils Absolute: 0 10*3/uL (ref 0.0–0.5)
Eosinophils Relative: 0 %
HCT: 46.1 % (ref 39.0–52.0)
Hemoglobin: 16 g/dL (ref 13.0–17.0)
Immature Granulocytes: 1 %
Lymphocytes Relative: 9 %
Lymphs Abs: 1 10*3/uL (ref 0.7–4.0)
MCH: 28.8 pg (ref 26.0–34.0)
MCHC: 34.7 g/dL (ref 30.0–36.0)
MCV: 83.1 fL (ref 80.0–100.0)
Monocytes Absolute: 0.8 10*3/uL (ref 0.1–1.0)
Monocytes Relative: 7 %
Neutro Abs: 9.6 10*3/uL — ABNORMAL HIGH (ref 1.7–7.7)
Neutrophils Relative %: 82 %
Platelets: 254 10*3/uL (ref 150–400)
RBC: 5.55 MIL/uL (ref 4.22–5.81)
RDW: 13 % (ref 11.5–15.5)
WBC: 11.7 10*3/uL — ABNORMAL HIGH (ref 4.0–10.5)
nRBC: 0 % (ref 0.0–0.2)

## 2022-07-23 LAB — COMPREHENSIVE METABOLIC PANEL
ALT: 37 U/L (ref 0–44)
AST: 30 U/L (ref 15–41)
Albumin: 3.4 g/dL — ABNORMAL LOW (ref 3.5–5.0)
Alkaline Phosphatase: 50 U/L (ref 38–126)
Anion gap: 14 (ref 5–15)
BUN: 15 mg/dL (ref 8–23)
CO2: 24 mmol/L (ref 22–32)
Calcium: 9.6 mg/dL (ref 8.9–10.3)
Chloride: 98 mmol/L (ref 98–111)
Creatinine, Ser: 0.85 mg/dL (ref 0.61–1.24)
GFR, Estimated: >60 mL/min (ref >60)
Glucose, Bld: 234 mg/dL — ABNORMAL HIGH (ref 70–99)
Potassium: 3 mmol/L — ABNORMAL LOW (ref 3.5–5.1)
Sodium: 136 mmol/L (ref 135–145)
Total Bilirubin: 1.1 mg/dL (ref 0.3–1.2)
Total Protein: 6.1 g/dL — ABNORMAL LOW (ref 6.5–8.1)

## 2022-07-23 LAB — GLUCOSE, CAPILLARY
Glucose-Capillary: 178 mg/dL — ABNORMAL HIGH (ref 70–99)
Glucose-Capillary: 213 mg/dL — ABNORMAL HIGH (ref 70–99)
Glucose-Capillary: 237 mg/dL — ABNORMAL HIGH (ref 70–99)
Glucose-Capillary: 238 mg/dL — ABNORMAL HIGH (ref 70–99)
Glucose-Capillary: 255 mg/dL — ABNORMAL HIGH (ref 70–99)
Glucose-Capillary: 293 mg/dL — ABNORMAL HIGH (ref 70–99)

## 2022-07-23 LAB — BRAIN NATRIURETIC PEPTIDE: B Natriuretic Peptide: 75 pg/mL (ref 0.0–100.0)

## 2022-07-23 LAB — MAGNESIUM: Magnesium: 1.8 mg/dL (ref 1.7–2.4)

## 2022-07-23 MED ORDER — DILTIAZEM HCL-DEXTROSE 125-5 MG/125ML-% IV SOLN (PREMIX)
5.0000 mg/h | INTRAVENOUS | Status: DC
  Administered 2022-07-23: 5 mg/h via INTRAVENOUS
  Filled 2022-07-23: qty 125

## 2022-07-23 MED ORDER — POTASSIUM CHLORIDE CRYS ER 20 MEQ PO TBCR
40.0000 meq | EXTENDED_RELEASE_TABLET | Freq: Once | ORAL | Status: AC
  Administered 2022-07-23: 40 meq via ORAL
  Filled 2022-07-23 (×2): qty 2

## 2022-07-23 MED ORDER — POTASSIUM CHLORIDE 10 MEQ/100ML IV SOLN
10.0000 meq | INTRAVENOUS | Status: DC
  Administered 2022-07-23 (×2): 10 meq via INTRAVENOUS
  Filled 2022-07-23 (×3): qty 100

## 2022-07-23 MED ORDER — DILTIAZEM LOAD VIA INFUSION
10.0000 mg | Freq: Once | INTRAVENOUS | Status: AC
  Administered 2022-07-23: 10 mg via INTRAVENOUS
  Filled 2022-07-23: qty 10

## 2022-07-23 MED ORDER — POTASSIUM CHLORIDE 2 MEQ/ML IV SOLN
INTRAVENOUS | Status: AC
  Filled 2022-07-23 (×3): qty 1000

## 2022-07-23 MED ORDER — PANTOPRAZOLE SODIUM 40 MG IV SOLR
40.0000 mg | Freq: Two times a day (BID) | INTRAVENOUS | Status: DC
  Administered 2022-07-23 – 2022-07-25 (×6): 40 mg via INTRAVENOUS
  Filled 2022-07-23 (×6): qty 10

## 2022-07-23 MED ORDER — HYDROMORPHONE HCL 1 MG/ML IJ SOLN
0.5000 mg | INTRAMUSCULAR | Status: DC | PRN
  Administered 2022-07-23 – 2022-07-26 (×14): 0.5 mg via INTRAVENOUS
  Filled 2022-07-23 (×14): qty 0.5

## 2022-07-23 MED ORDER — ONDANSETRON HCL 4 MG/2ML IJ SOLN
4.0000 mg | Freq: Four times a day (QID) | INTRAMUSCULAR | Status: DC
  Administered 2022-07-23 – 2022-07-25 (×3): 4 mg via INTRAVENOUS
  Filled 2022-07-23 (×4): qty 2

## 2022-07-23 MED ORDER — POTASSIUM CHLORIDE 10 MEQ/100ML IV SOLN
10.0000 meq | INTRAVENOUS | Status: AC
  Administered 2022-07-23 (×2): 10 meq via INTRAVENOUS
  Filled 2022-07-23 (×2): qty 100

## 2022-07-23 MED ORDER — ENOXAPARIN SODIUM 120 MG/0.8ML IJ SOSY
1.0000 mg/kg | PREFILLED_SYRINGE | Freq: Once | INTRAMUSCULAR | Status: AC
  Administered 2022-07-23: 114 mg via SUBCUTANEOUS
  Filled 2022-07-23: qty 0.76

## 2022-07-23 MED ORDER — SODIUM CHLORIDE 0.9 % IV SOLN: INTRAVENOUS | Status: DC

## 2022-07-23 MED ORDER — METOCLOPRAMIDE HCL 5 MG/ML IJ SOLN
10.0000 mg | Freq: Three times a day (TID) | INTRAMUSCULAR | Status: DC
  Administered 2022-07-23 – 2022-07-24 (×5): 10 mg via INTRAVENOUS
  Filled 2022-07-23 (×6): qty 2

## 2022-07-23 MED ORDER — LACTATED RINGERS IV SOLN: INTRAVENOUS | Status: DC

## 2022-07-23 MED ORDER — ONDANSETRON HCL 4 MG/2ML IJ SOLN
4.0000 mg | Freq: Four times a day (QID) | INTRAMUSCULAR | Status: DC | PRN
  Administered 2022-07-23 (×2): 4 mg via INTRAVENOUS
  Filled 2022-07-23 (×2): qty 2

## 2022-07-23 MED ORDER — MAGNESIUM SULFATE 2 GM/50ML IV SOLN
2.0000 g | Freq: Once | INTRAVENOUS | Status: AC
  Administered 2022-07-23: 2 g via INTRAVENOUS
  Filled 2022-07-23: qty 50

## 2022-07-23 MED ORDER — METOPROLOL TARTRATE 50 MG PO TABS
50.0000 mg | ORAL_TABLET | Freq: Two times a day (BID) | ORAL | Status: DC
  Administered 2022-07-23 – 2022-07-25 (×5): 50 mg via ORAL
  Filled 2022-07-23 (×5): qty 1

## 2022-07-23 MED ORDER — MAGNESIUM SULFATE IN D5W 1-5 GM/100ML-% IV SOLN
1.0000 g | Freq: Once | INTRAVENOUS | Status: AC
  Administered 2022-07-23: 1 g via INTRAVENOUS
  Filled 2022-07-23: qty 100

## 2022-07-23 MED ORDER — FENTANYL 25 MCG/HR TD PT72
1.0000 | MEDICATED_PATCH | TRANSDERMAL | Status: DC
  Administered 2022-07-23 – 2022-07-26 (×2): 1 via TRANSDERMAL
  Filled 2022-07-23 (×2): qty 1

## 2022-07-23 NOTE — Plan of Care (Signed)

## 2022-07-23 NOTE — Consult Note (Signed)
Consultation  Referring Provider: [facility]/ Candiss Norse Primary Care Physician:  Esmond Harps, MD Primary Gastroenterologist:  Dr Medoff/ previous  Reason for Consultation: Nausea vomiting, probable esophagitis on CT  HPI: [name] is a 67 y.o. male with history of insulin-dependent diabetes mellitus, hypertension, sleep apnea, and recent diagnosis of radiculopathy and neuropathy per neurology.  He has an L5-S1 foraminal stenosis. He was admitted through the emergency room on 07/21/2022 after he had fallen at home 3 times on the day of admission with syncope on at least 1 of those occasions.  He did hit the front of his head. CT of the head on admission with no acute infarct or hemorrhage Cervical CT showed no acute fracture, C4-5 Acdf,, there is marked spondylosis left greater than right neural foraminal encroachment Initial rib films showed an acute left eighth rib fracture. He had CT angio done on 07/22/2022 of the chest that showed the esophagus to be within normal limits but acute left rib fractures at levels 6 7 and 8 no evidence of PE. Patient had onset of nausea and vomiting in the past 24 hours apparently vomited several times last night and did have at least 1 episode of diarrhea.  He is not a great historian.  He says he ate meatloaf from the hospital he believes on Thursday evening and has not felt well since.  He was not having any GI issues or nausea and vomiting prior to admission.  He says he has been using Imodium at home and has been having some problems with diarrhea.  He has not had any hematemesis, no complaints of heartburn or indigestion no dysphagia or odynophagia.  No early satiety symptoms at home.  CT of the abdomen and pelvis done today shows some circumferential wall thickening of the distal esophagus nonspecific, and diffuse hepatic steatosis, he has 3 nonobstructive renal calculi  KUB done yesterday did show distended stomach  Patient is miserable with low back pain  and rib pain.  He has been taking clear liquids today but says he vomited about an hour ago and feels very nauseated now.  No complaints of abdominal pain  Labs today WBC 11.7/hemoglobin 16/hematocrit 46.1 platelets 254 Potassium 3.0 BUN 15/creatinine 0.85 Albumin 3.4 LFTs within normal limits  He says  has had 3-4 prior colonoscopies says the last one was clear.  He is not certain about prior endoscopies.    Past Medical History:  Diagnosis Date   Arthritis    Diabetes mellitus without complication ([place])    Hypertension    Sleep apnea     History reviewed. No pertinent surgical history.  Prior to Admission medications   Medication Sig Start Date End Date Taking? Authorizing Provider  acetaminophen (TYLENOL) 500 MG tablet Take 500 mg by mouth every 6 (six) hours as needed. Takes two 500mg  at bedtime   Yes [provider]  amLODipine (NORVASC) 5 MG tablet Take 5 mg by mouth daily. 07/23/16  Yes [provider]  chlorthalidone (HYGROTON) 25 MG tablet Take 25 mg by mouth daily. 07/07/16  Yes [provider]  cyclobenzaprine (FLEXERIL) 10 MG tablet Take 10 mg by mouth daily as needed for muscle spasms.   Yes [provider]  diclofenac Sodium (VOLTAREN) 1 % GEL Apply 2 g topically 4 (four) times daily as needed (pain). 09/20/19  Yes [provider]  DULoxetine (CYMBALTA) 20 MG capsule Take 60 mg by mouth daily. 08/28/19  Yes [provider]  gabapentin (NEURONTIN) 300  MG capsule Take 300-1,500 mg by mouth daily. Take 300mg  (1 capsule) by mouth in the mornings and 1500mg  (5 capsules) in the evening. 08/03/16  Yes [provider]  losartan (COZAAR) 50 MG tablet Take 50 mg by mouth daily. 07/14/16  Yes [provider]  metoprolol succinate (TOPROL-XL) 25 MG 24 hr tablet Take 25 mg by mouth daily. 10/15/19  Yes [provider]  Multiple Vitamins-Minerals (MULTIVITAMIN WITH MINERALS) tablet Take 1 tablet by mouth daily.    Yes [provider]  simvastatin (ZOCOR) 20 MG tablet Take 20 mg by mouth daily. 07/07/16  Yes [provider]  potassium chloride SA (KLOR-CON M) 20 MEQ tablet One po bid x 3 days, then one po once a day Patient not taking: Reported on 07/23/2022 06/17/22   Lajean Saver, MD    Current Facility-Administered Medications  Medication Dose Route Frequency Provider Last Rate Last Admin   acetaminophen (TYLENOL) tablet 650 mg  650 mg Oral Q6H PRN Toy Baker, MD   650 mg at 07/21/22 2244   Or   acetaminophen (TYLENOL) suppository 650 mg  650 mg Rectal Q6H PRN Toy Baker, MD       DULoxetine (CYMBALTA) DR capsule 40 mg  40 mg Oral Daily Doutova, Anastassia, MD   40 mg at 07/23/22 0936   fentaNYL (DURAGESIC) 25 MCG/HR 1 patch  1 patch Transdermal Q72H Thurnell Lose, MD   1 patch at 07/23/22 0845   gabapentin (NEURONTIN) capsule 300 mg  300 mg Oral Daily Toy Baker, MD   300 mg at 07/23/22 9678   HYDROmorphone (DILAUDID) injection 0.5 mg  0.5 mg Intravenous Q4H PRN Thurnell Lose, MD   0.5 mg at 07/23/22 1312   insulin aspart (novoLOG) injection 0-9 Units  0-9 Units Subcutaneous Q4H Doutova, Nyoka Lint, MD   5 Units at 07/23/22 1308   lactated ringers 1,000 mL with potassium chloride 20 mEq infusion   Intravenous Continuous Thurnell Lose, MD       metoprolol tartrate (LOPRESSOR) tablet 50 mg  50 mg Oral BID Thurnell Lose, MD   50 mg at 07/23/22 1308   ondansetron (ZOFRAN) injection 4 mg  4 mg Intravenous Q6H PRN Kristopher Oppenheim, DO   4 mg at 07/23/22 0835   pantoprazole (PROTONIX) injection 40 mg  40 mg Intravenous Q12H Thurnell Lose, MD   40 mg at 07/23/22 1311   potassium chloride 10 mEq in 100 mL IVPB  10 mEq Intravenous Q1 Hr x 2 Jeneen Rinks, RPH       senna (SENOKOT) tablet 17.2 mg  2 tablet Oral Daily Kristopher Oppenheim, DO   17.2 mg at 07/22/22 2106   simvastatin (ZOCOR) tablet 20 mg  20 mg Oral Daily Doutova, Nyoka Lint, MD   20 mg at  07/23/22 9381   traMADol (ULTRAM) tablet 50 mg  50 mg Oral Q6H PRN Toy Baker, MD   50 mg at 07/23/22 1155    Allergies as of 07/21/2022 - Review Complete 07/21/2022  Allergen Reaction Noted   Metformin Diarrhea and Nausea And Vomiting 11/29/2016   Oxycodone Other (See Comments) 02/17/2021   Oxycodone-acetaminophen  02/17/2020   Lisinopril Cough 08/30/2016   Morphine Itching 08/30/2016   Morphine and related Itching 08/30/2016    Family History  Problem Relation Age of Onset   Dementia Mother    Alzheimer's disease Mother    Kidney Stones Father     Social History   Socioeconomic History   Marital status: Single  Spouse name: Not on file   Number of children: Not on file   Years of education: Not on file   Highest education level: Not on file  Occupational History   Not on file  Tobacco Use   Smoking status: Never   Smokeless tobacco: Never  Substance and Sexual Activity   Alcohol use: Never   Drug use: Never   Sexual activity: Not on file  Other Topics Concern   Not on file  Social History Narrative   Are you right handed or left handed? Right Handed    Are you currently employed ? No    What is your current occupation? Retired    Do you live at home alone? yes   Who lives with you? No one    What type of home do you live in: 1 story or 2 story? One story home       Social Determinants of Health   Financial Resource Strain: Not on file  Food Insecurity: No Food Insecurity (07/21/2022)   Hunger Vital Sign    Worried About Running Out of Food in the Last Year: Never true    Ran Out of Food in the Last Year: Never true  Transportation Needs: No Transportation Needs (07/21/2022)   PRAPARE - Hydrologist (Medical): No    Lack of Transportation (Non-Medical): No  Physical Activity: Not on file  Stress: Not on file  Social Connections: Not on file  Intimate Partner Violence: Not At Risk (07/21/2022)   Humiliation, Afraid,  Rape, and Kick questionnaire    Fear of Current or Ex-Partner: No    Emotionally Abused: No    Physically Abused: No    Sexually Abused: No    Review of Systems: Pertinent positive and negative review of systems were noted in the above HPI section.  All other review of systems was otherwise negative.   Physical Exam: Vital signs in last 24 hours: Temp:  [97.6 F (36.4 C)-98.3 F (36.8 C)] 98.3 F (36.8 C) (02/03 1100) Pulse Rate:  [89-106] 106 (02/03 1247) Resp:  [10-24] 10 (02/03 1246) BP: (130-160)/(74-99) 153/74 (02/03 1245) SpO2:  [83 %-96 %] 91 % (02/03 1247) Last BM Date : 07/21/22 General:   Alert,  Well-developed, obese older white male, uncomfortable appearing having difficulty adjusting himself in bed ,cooperative in NAD Head:  Normocephalic and atraumatic. Eyes:  Sclera clear, no icterus.   Conjunctiva pink. Ears:  Normal auditory acuity. Nose:  No deformity, discharge,  or lesions. Mouth:  No deformity or lesions.   Neck:  Supple; no masses or thyromegaly. Lungs:  Clear throughout to auscultation.   No wheezes, crackles, or rhonchi. Heart:  tachy Regular rate and rhythm; no murmurs, clicks, rubs,  or gallops. Abdomen:  Soft, large nontender, there is some fullness in the epigastrium BS active,nonpalp mass or hsm.   Rectal: Not done Msk:  Symmetrical without gross deformities. . Pulses:  Normal pulses noted. Extremities:  Without clubbing or edema. Neurologic:  Alert and  oriented x4;  grossly normal neurologically. Skin:  Intact without significant lesions or rashes.. Psych:  Alert and cooperative. Normal mood and affect, irritable  Intake/Output from previous day: 02/02 0701 - 02/03 0700 In: 143 [I.V.:143] Out: 2000 [Urine:2000] Intake/Output this shift: Total I/O In: -  Out: 400 [Urine:400]  Lab Results: Recent Labs    07/21/22 1710 07/22/22 0702 07/23/22 0334  WBC 11.7* 11.2* 11.7*  HGB 13.1 14.2 16.0  HCT 37.9* 40.7 46.1  PLT 217 263 254    BMET Recent Labs    07/22/22 0135 07/22/22 0702 07/23/22 0334  NA 133* 137 136  K 3.9 3.6 3.0*  CL 100 101 98  CO2 21* 28 24  GLUCOSE 258* 166* 234*  BUN 24* 22 15  CREATININE 1.02 0.97 0.85  CALCIUM 8.9 9.0 9.6   LFT Recent Labs    07/23/22 0334  PROT 6.1*  ALBUMIN 3.4*  AST 30  ALT 37  ALKPHOS 50  BILITOT 1.1   PT/INR Recent Labs    07/21/22 1710  LABPROT 14.8  INR 1.2    IMPRESSION:  #47 67 year old white male diabetic admitted on 07/21/2022 with 3 falls at home that day at least 1 with syncope, this is in the setting of recent neuroevaluation with diagnosis of combined radiculopathy and neuropathy and L5-S1 foraminal stenosis.  Unfortunately he sustained 3 acute left rib fractures with his fall CT of the head was negative   #2 dehydration with hypomagnesemia, hypokalemia hypophosphatemia #3 obstructive sleep apnea with CPAP use  #4 acute onset nausea and vomiting yesterday, patient also complains of diarrhea though I do not see this documented as to number of episodes etc. Patient is concerned that he developed food poisoning after eating meatloaf the other night at the hospital  He did have a distended stomach noted on KUB but not on CT today Fullness in the epigastrium on exam query distended stomach/gastroparesis  CT shows possible distal esophagitis-which may be acute, as he had not had any symptoms consistent with GERD prior to admission  He has been on significant narcotics here with Durogesic and Dilaudid could certainly be contributing to nausea and vomiting and decreased gastric motility  #5 history of colon polyps-prior colonoscopies through digestive health #6 nephrolithiasis #7 hepatic steatosis    PLAN: Sips of clears Change Zofran to 4 mg IV every 6 hours around-the-clock Reglan 10 mg every 6 hours around-the-clock short-term Continue IV PPI twice daily May need to decrease narcotics If diarrhea persists will need stool studies He  may need EGD at some point to evaluate for esophagitis though will treat as such.  This is not emergent at this time.  Will treat him conservatively as above, and follow his course over the next 24 to 48 hours then decide if EGD will be indicated inpatient.  GI will follow with you   Aliviana Burdell EsterwoodPA-C  07/23/2022, 1:44 PM

## 2022-07-23 NOTE — Progress Notes (Signed)
PROGRESS NOTE                                                                                                                                                                                                             Patient Demographics:    Alan Lin, is a 67 y.o. male, DOB - 1955/08/15, DI:[id number]  Outpatient Primary MD for the patient is Esmond Harps, MD    LOS - 1  Admit date - 07/21/2022    Chief Complaint  Patient presents with   Fall   Loss of Consciousness       Brief Narrative (HPI from H&P)   67 y.o. male with medical history significant of Dm2, HTN, falls.  Resented to the ER after an episode of diarrhea last night after which she took 1 Imodium, thereafter he went to the bathroom 4 AM in the morning where he syncopized once and twice again thereafter, he was slightly lightheaded during these episodes but did not have any chest pain or shortness of breath.  He recently had a C-spine surgery for which she wears a c-collar, during his falls he did hurt his left-sided rib cage.  Brought to the ER where head CT and C-spine CT were stable, CTA chest was stable, he did have fractures on his left rib cage, he was admitted for further workup for syncope.    Subjective:   Patient in bed, having some left-sided rib cage pain, no shortness of breath, does have some nausea, had multiple episodes of emesis last night, also had diarrhea thinks he ate meatloaf in the hospital and got food poisoning from it.   Assessment  & Plan :    Multiple falls after syncope and lightheadedness in a patient with previous history of falls.  Unclear reason but could be due to orthostatic hypotension caused by an episode of diarrhea the night of admission.  CT head unremarkable, no focal deficits, echocardiogram stable, stable on telemetry, he did have some incontinence on one of the episodes hence will get EEG, carotid Doppler ordered upon  admission is pending, will check orthostatics and advance activity with PT OT.  New IV fluids, monitor closely on telemetry, will also arrange for 30-day monitor upon discharge.  Peripheral neuropathy.  Noted on EMG studies done outpatient recently.  Outpatient follow-up with his neurologist, PT OT  here, may require placement.    Gastroenteritis.  Questionable food poisoning at night on 07/22/2022, abdominal exam stable, KUB stable, CT abdomen pelvis pending, placed on clears and supportive care, continue to monitor.    Chronic neck pain and C-spine surgery.  Continue c-collar follow-up with orthopedic surgeon postdischarge.  Dehydration with hypomagnesemia, hypophosphatemia and hypokalemia.  IV fluids, replace electrolytes.  Left rib cage fractures.  Supportive care.  Pain medications adjusted for better control on 07/23/2022.  OSA.  CPAP at night.  Hypertension.  Pressure stable placed on beta-blocker.  Sinus tachycardia with PVCs.  Seen by cardiologist Dr. Harl Bowie ruled out atrial fibrillation, IV fluids, beta-blocker, treat nausea vomiting, hydrate with IV fluids, stable TSH echo.  Mild metabolic encephalopathy.  At times getting confused.  Has no recollection of seeing me yesterday, was upset that the doctor did not come and update him at 2 AM in the morning for his nausea vomiting.  Brother confirms that he has some memory issues and can get confused from time to time.  Minimize narcotics and benzodiazepines.  At risk for delirium.      Condition - Fair  Family Communication  : Daughter updated in detail on 07/23/2022.  Code Status :  Full  Consults  :  Cards  PUD Prophylaxis :    Procedures  :     CT abdomen pelvis.     EEG - Non acute  Carotid Doppler.  Non acute  TTE - 1. Windows not well visualized. Cannot assess wall motion. Left ventricular ejection fraction, by estimation, is 60 to 65%. The left ventricle has normal function.  2. Right ventricular systolic function is  normal. The right ventricular size is normal.  3. No evidence of mitral valve regurgitation.  4. Aortic valve regurgitation is not visualized.  5. The inferior vena cava is normal in size with greater than 50% respiratory variability, suggesting right atrial pressure of 3 mmHg.  CTA -  Fractures of the left sixth, seventh and eighth ribs without complicating factors. No evidence of pulmonary emboli. Mild right basilar atelectasis. Aortic Atherosclerosis  CT Head - Non acute  CT C Spine - 1. No acute cervical spine fracture. 2. Prior C4-5 ACDF, with multilevel spondylosis and facet hypertrophy as above.       Disposition Plan  :    Status is: Observation  DVT Prophylaxis  :    SCDs Start: 07/21/22 2209     Lab Results  Component Value Date   PLT 254 07/23/2022    Diet :  Diet Order             Diet clear liquid Room service appropriate? Yes; Fluid consistency: Thin  Diet effective now                    Inpatient Medications  Scheduled Meds:  DULoxetine  40 mg Oral Daily   fentaNYL  1 patch Transdermal Q72H   gabapentin  300 mg Oral Daily   insulin aspart  0-9 Units Subcutaneous Q4H   metoprolol tartrate  50 mg Oral BID   senna  2 tablet Oral Daily   simvastatin  20 mg Oral Daily   Continuous Infusions:  sodium chloride 75 mL/hr at 07/23/22 0929   PRN Meds:.acetaminophen **OR** acetaminophen, HYDROmorphone (DILAUDID) injection, ondansetron (ZOFRAN) IV, traMADol  Antibiotics  :    Anti-infectives (From admission, onward)    None         Objective:   Vitals:   07/23/22  1100 07/23/22 1130 07/23/22 1145 07/23/22 1147  BP: (!) 160/87  (!) 158/83   Pulse: 98 98  99  Resp: 11 17  17   Temp: 98.3 F (36.8 C)     TempSrc: Axillary     SpO2: 92% 92%  91%  Weight:      Height:        Wt Readings from Last 3 Encounters:  07/21/22 112.8 kg  07/05/22 117.9 kg  05/21/22 117.9 kg     Intake/Output Summary (Last 24 hours) at 07/23/2022 1234 Last data  filed at 07/23/2022 1031 Gross per 24 hour  Intake 143.03 ml  Output 2000 ml  Net -1856.97 ml     Physical Exam  Awake Alert, No new F.N deficits, Normal affect [place].AT,PERRAL Supple Neck, No JVD, C Collar in place  Symmetrical Chest wall movement, Good air movement bilaterally, CTAB RRR,No Gallops,Rubs or new Murmurs,  +ve B.Sounds, Abd Soft, No tenderness,   No Cyanosis, Clubbing or edema      Data Review:    Recent Labs  Lab 07/21/22 1710 07/22/22 0702 07/23/22 0334  WBC 11.7* 11.2* 11.7*  HGB 13.1 14.2 16.0  HCT 37.9* 40.7 46.1  PLT 217 263 254  MCV 83.8 82.7 83.1  MCH 29.0 28.9 28.8  MCHC 34.6 34.9 34.7  RDW 13.2 13.2 13.0  LYMPHSABS 0.9  --  1.0  MONOABS 0.6  --  0.8  EOSABS 0.0  --  0.0  BASOSABS 0.1  --  0.1    Recent Labs  Lab 07/21/22 1710 07/21/22 1907 07/21/22 1940 07/21/22 2216 07/22/22 0135 07/22/22 0434 07/22/22 0702 07/22/22 0704 07/23/22 0334  NA 134*  --   --   --  133*  --  137  --  136  K 2.7*  --   --   --  3.9  --  3.6  --  3.0*  CL 102  --   --   --  100  --  101  --  98  CO2 19*  --   --   --  21*  --  28  --  24  ANIONGAP 13  --   --   --  12  --  8  --  14  GLUCOSE 280*  --   --   --  258*  --  166*  --  234*  BUN 24*  --   --   --  24*  --  22  --  15  CREATININE 0.82  --   --   --  1.02  --  0.97  --  0.85  AST 46*  --   --   --   --   --  26  --  30  ALT 37  --   --   --   --   --  31  --  37  ALKPHOS 45  --   --   --   --   --  46  --  50  BILITOT 1.0  --   --   --   --   --  0.7  --  1.1  ALBUMIN 3.1*  --   --   --   --   --  3.1*  --  3.4*  DDIMER 1.43*  --   --   --   --   --   --   --   --   LATICACIDVEN  --   --  1.7 2.2* 2.4* 1.9  --   --   --   INR 1.2  --   --   --   --   --   --   --   --   TSH  --  0.453  --   --   --   --   --   --   --   HGBA1C 8.0*  --   --   --   --   --   --   --   --   BNP  --   --   --   --   --   --   --  67.8 75.0  MG 1.6*  --   --   --   --   --  2.1  --  1.8  CALCIUM 8.4*  --   --    --  8.9  --  9.0  --  9.6      Recent Labs  Lab 07/21/22 1710 07/21/22 1907 07/21/22 1940 07/21/22 2216 07/22/22 0135 07/22/22 0434 07/22/22 0702 07/22/22 0704 07/23/22 0334  DDIMER 1.43*  --   --   --   --   --   --   --   --   LATICACIDVEN  --   --  1.7 2.2* 2.4* 1.9  --   --   --   INR 1.2  --   --   --   --   --   --   --   --   TSH  --  0.453  --   --   --   --   --   --   --   HGBA1C 8.0*  --   --   --   --   --   --   --   --   BNP  --   --   --   --   --   --   --  67.8 75.0  MG 1.6*  --   --   --   --   --  2.1  --  1.8  CALCIUM 8.4*  --   --   --  8.9  --  9.0  --  9.6      Lab Results  Component Value Date   HGBA1C 8.0 (H) 07/21/2022    Recent Labs    07/21/22 1907  TSH 0.453   Radiology Reports VAS US CAROTID  Result Date: 07/23/2022 Carotid Arterial Duplex Study Patient Name:  Alan Lin  Date of Exam:   07/22/2022 Medical Rec #: [id number]         Accession #:    [id number] Date of Birth: Mar 06, 1956          Patient Gender: M Patient Age:   33 years Exam Location:  [facility] Procedure:      VAS US CAROTID Referring Phys: Nyoka Lint DOUTOVA --------------------------------------------------------------------------------  Indications:       Syncope. Risk Factors:      Hypertension, hyperlipidemia, Diabetes, no history of                    smoking. Limitations        Today's exam was limited due to Calcific shadowing, poor                    ultrasound tissue/interface and the body habitus of the  patient. Comparison Study:  No previous exams Performing Technologist: Jody Hill RVT, RDMS  Examination Guidelines: A complete evaluation includes B-mode imaging, spectral Doppler, color Doppler, and power Doppler as needed of all accessible portions of each vessel. Bilateral testing is considered an integral part of a complete examination. Limited examinations for reoccurring indications may be performed as noted.  Right Carotid Findings:  +----------+-------+--------+--------+----------------------+------------------+           PSV    EDV cm/sStenosisPlaque Description    Comments                     cm/s                                                            +----------+-------+--------+--------+----------------------+------------------+ CCA Prox  59     9               hyperechoic and       intimal thickening                                  calcific                                 +----------+-------+--------+--------+----------------------+------------------+ CCA Distal51     6                                     intimal thickening +----------+-------+--------+--------+----------------------+------------------+ ICA Prox  39     7               calcific              Shadowing          +----------+-------+--------+--------+----------------------+------------------+ ICA Distal53     13              calcific                                 +----------+-------+--------+--------+----------------------+------------------+ ECA       123    9               calcific                                 +----------+-------+--------+--------+----------------------+------------------+ +----------+--------+-------+----------------+-------------------+           PSV cm/sEDV cmsDescribe        Arm Pressure (mmHG) +----------+--------+-------+----------------+-------------------+ [id number]             Multiphasic, WNL                    +----------+--------+-------+----------------+-------------------+ +---------+--------+--+--------+--+---------+ VertebralPSV cm/s54EDV cm/s11Antegrade +---------+--------+--+--------+--+---------+  Left Carotid Findings: +----------+-------+--------+--------+-----------------------+-----------------+           PSV    EDV cm/sStenosisPlaque Description     Comments                    cm/s                                                             +----------+-------+--------+--------+-----------------------+-----------------+  CCA Prox  88     8               calcific               intimal                                                                   thickening        +----------+-------+--------+--------+-----------------------+-----------------+ CCA Distal85     11              heterogenous and       intimal                                            calcific               thickening        +----------+-------+--------+--------+-----------------------+-----------------+ ICA Prox  58     14              calcific               Shadowing         +----------+-------+--------+--------+-----------------------+-----------------+ ICA Distal50     16                                                       +----------+-------+--------+--------+-----------------------+-----------------+ ECA       134                                           shadowing         +----------+-------+--------+--------+-----------------------+-----------------+ +----------+--------+--------+----------------+-------------------+           PSV cm/sEDV cm/sDescribe        Arm Pressure (mmHG) +----------+--------+--------+----------------+-------------------+ [id number]             Multiphasic, WNL                    +----------+--------+--------+----------------+-------------------+ +---------+--------+--+--------+--------------+ VertebralPSV cm/s32EDV cm/sHigh resistant +---------+--------+--+--------+--------------+   Summary: Right Carotid: Velocities in the right ICA are consistent with a 1-39% stenosis. Left Carotid: Velocities in the left ICA are consistent with a 1-39% stenosis.               The extracranial vessels were near-normal with only minimal wall               thickening or plaque. Vertebrals:  Right vertebral artery demonstrates antegrade flow. Left vertebral              artery demonstrates high  resistant flow. Subclavians: Normal flow hemodynamics were seen in bilateral subclavian              arteries. *See table(s) above for measurements and observations.  Electronically signed by Orlie Pollen on 07/23/2022 at 10:34:35 AM.    Final    DG Abd 1 View  Result Date: 07/23/2022 CLINICAL  DATA:  Vomiting with nausea EXAM: ABDOMEN - 1 VIEW COMPARISON:  None Available. FINDINGS: The stomach is distended. Otherwise limited visualization of bowel gas pattern due to body habitus. IMPRESSION: No dilated small bowel visible, but study limited by body habitus. Electronically Signed   By: Deatra Robinson M.D.   On: 07/23/2022 03:24   EEG adult  Result Date: 07/22/2022 Charlsie Quest, MD     07/22/2022  5:01 PM Patient Name: Alan Lin MRN: [id number] Epilepsy Attending: Charlsie Quest Referring Physician/Provider: Leroy Sea, MD Date: 07/22/2022 Duration: 25.06 mins Patient history: 67yo M with syncope. EEG to evaluate for seizure Level of alertness: Awake AEDs during EEG study: None Technical aspects: This EEG study was done with scalp electrodes positioned according to the 10-20 International system of electrode placement. Electrical activity was reviewed with band pass filter of 1-70Hz , sensitivity of 7 uV/mm, display speed of 63mm/sec with a 60Hz  notched filter applied as appropriate. EEG data were recorded continuously and digitally stored.  Video monitoring was available and reviewed as appropriate. Description: The posterior dominant rhythm consists of 9 Hz activity of moderate voltage (25-35 uV) seen predominantly in posterior head regions, symmetric and reactive to eye opening and eye closing. Physiologic photic driving was seen during photic stimulation.  Hyperventilation was not performed.   IMPRESSION: This study is within normal limits. No seizures or epileptiform discharges were seen throughout the recording. A normal interictal EEG does not exclude  the diagnosis of epilepsy.   ECHOCARDIOGRAM COMPLETE  Result Date: 07/22/2022    ECHOCARDIOGRAM REPORT   Patient Name:   Alan Lin Date of Exam: 07/22/2022 Medical Rec #:  [id number]        Height:       70.0 in Accession #:    [id number]       Weight:       248.7 lb Date of Birth:  06/29/1955         BSA:          2.290 m Patient Age:    66 years         BP:           117/68 mmHg Patient Gender: M                HR:           89 bpm. Exam Location:  Inpatient Procedure: 2D Echo and Intracardiac Opacification Agent Indications:    syncope  History:        Patient has no prior history of Echocardiogram examinations.                 Risk Factors:Hypertension, Diabetes and Dyslipidemia.  Sonographer:    12/26/1955 Referring Phys: Cathie Hoops ANASTASSIA DOUTOVA  Sonographer Comments: Technically difficult study due to poor echo windows, suboptimal apical window, suboptimal parasternal window, suboptimal subcostal window and patient is obese. Image acquisition challenging due to patient body habitus, Image acquisition challenging due to respiratory motion and broken ribs. IMPRESSIONS  1. Windows not well visualized. Cannot assess wall motion. Left ventricular ejection fraction, by estimation, is 60 to 65%. The left ventricle has normal function.  2. Right ventricular systolic function is normal. The right ventricular size is normal.  3. No evidence of mitral valve regurgitation.  4. Aortic valve regurgitation is not visualized.  5. The inferior vena cava is normal in size with greater than 50% respiratory variability, suggesting right atrial pressure of 3 mmHg.  FINDINGS  Left Ventricle: Windows not well visualized. Cannot assess wall motion. Left ventricular ejection fraction, by estimation, is 60 to 65%. The left ventricle has normal function. Definity contrast agent was given IV to delineate the left ventricular endocardial borders. The left ventricular internal cavity size was normal in size. Right Ventricle: The right ventricular size  is normal. Right ventricular systolic function is normal. Left Atrium: Left atrial size was not well visualized. Right Atrium: Right atrial size was not well visualized. Pericardium: The pericardium was not well visualized. Mitral Valve: No evidence of mitral valve regurgitation. Tricuspid Valve: Tricuspid valve regurgitation is not demonstrated. Aortic Valve: Aortic valve regurgitation is not visualized. Aortic valve mean gradient measures 3.0 mmHg. Aortic valve peak gradient measures 6.0 mmHg. Aortic valve area, by VTI measures 2.25 cm. Pulmonic Valve: Pulmonic valve regurgitation is not visualized. Aorta: The aortic root is normal in size and structure. Venous: The inferior vena cava is normal in size with greater than 50% respiratory variability, suggesting right atrial pressure of 3 mmHg. IAS/Shunts: The interatrial septum was not well visualized.  LEFT VENTRICLE PLAX 2D LVIDd:         4.20 cm     Diastology LVIDs:         2.80 cm     LV e' medial:    7.29 cm/s LV PW:         1.00 cm     LV E/e' medial:  7.6 LV IVS:        0.90 cm     LV e' lateral:   8.05 cm/s LVOT diam:     2.30 cm     LV E/e' lateral: 6.9 LV SV:         51 LV SV Index:   22 LVOT Area:     4.15 cm  LV Volumes (MOD) LV vol d, MOD A2C: 75.4 ml LV vol d, MOD A4C: 92.6 ml LV vol s, MOD A2C: 25.3 ml LV vol s, MOD A4C: 28.8 ml LV SV MOD A2C:     50.1 ml LV SV MOD A4C:     92.6 ml LV SV MOD BP:      59.1 ml RIGHT VENTRICLE RV S prime:     16.00 cm/s TAPSE (M-mode): 2.0 cm LEFT ATRIUM           Index LA diam:      3.70 cm 1.62 cm/m LA Vol (A4C): 58.6 ml 25.59 ml/m  AORTIC VALVE                    PULMONIC VALVE AV Area (Vmax):    2.14 cm     PV Vmax:       0.99 m/s AV Area (Vmean):   2.10 cm     PV Peak grad:  3.9 mmHg AV Area (VTI):     2.25 cm AV Vmax:           122.00 cm/s AV Vmean:          81.900 cm/s AV VTI:            0.227 m AV Peak Grad:      6.0 mmHg AV Mean Grad:      3.0 mmHg LVOT Vmax:         62.70 cm/s LVOT Vmean:        41.400  cm/s LVOT VTI:          0.123 m LVOT/AV VTI ratio: 0.54  AORTA Ao  Root diam: 3.50 cm MITRAL VALVE MV Area (PHT): 3.57 cm    SHUNTS MV Decel Time: 213 msec    Systemic VTI:  0.12 m MV E velocity: 55.25 cm/s  Systemic Diam: 2.30 cm MV A velocity: 51.45 cm/s MV E/A ratio:  1.07 Landscape architect signed by Phineas Inches Signature Date/Time: 07/22/2022/9:56:01 AM    Final    CT Angio Chest Pulmonary Embolism (PE) W or WO Contrast  Result Date: 07/22/2022 CLINICAL DATA:  Difficulty breathing EXAM: CT ANGIOGRAPHY CHEST WITH CONTRAST TECHNIQUE: Multidetector CT imaging of the chest was performed using the standard protocol during bolus administration of intravenous contrast. Multiplanar CT image reconstructions and MIPs were obtained to evaluate the vascular anatomy. RADIATION DOSE REDUCTION: This exam was performed according to the departmental dose-optimization program which includes automated exposure control, adjustment of the mA and/or kV according to patient size and/or use of iterative reconstruction technique. CONTRAST:  27mL OMNIPAQUE IOHEXOL 350 MG/ML SOLN COMPARISON:  Chest x-ray from earlier in the same day. FINDINGS: Cardiovascular: Atherosclerotic calcifications of the thoracic aorta are noted. No aneurysmal dilatation or dissection is noted. No cardiac enlargement is seen. Coronary calcifications are seen. The pulmonary artery shows a normal branching pattern bilaterally. No intraluminal filling defect to suggest pulmonary embolism is seen. Mediastinum/Nodes: Thoracic inlet is within normal limits. No hilar or mediastinal adenopathy is noted. The esophagus is within normal limits. Lungs/Pleura: Minimal right basilar atelectasis is noted. The lungs are otherwise well aerated. No focal infiltrate or effusion is seen. Upper Abdomen: Visualized upper abdomen is within normal limits. Musculoskeletal: Degenerative changes of the thoracic spine are noted. Mildly displaced fractures of the left sixth,  seventh and eighth ribs are noted without complicating factors. Review of the MIP images confirms the above findings. IMPRESSION: Fractures of the left sixth, seventh and eighth ribs without complicating factors. No evidence of pulmonary emboli. Mild right basilar atelectasis. Aortic Atherosclerosis (ICD10-I70.0). Electronically Signed   By: Inez Catalina M.D.   On: 07/22/2022 00:00   CT Cervical Spine Wo Contrast  Result Date: 07/21/2022 CLINICAL DATA:  [place], loss of consciousness EXAM: CT CERVICAL SPINE WITHOUT CONTRAST TECHNIQUE: Multidetector CT imaging of the cervical spine was performed without intravenous contrast. Multiplanar CT image reconstructions were also generated. RADIATION DOSE REDUCTION: This exam was performed according to the departmental dose-optimization program which includes automated exposure control, adjustment of the mA and/or kV according to patient size and/or use of iterative reconstruction technique. COMPARISON:  05/21/2022 FINDINGS: Alignment: Alignment is grossly anatomic. Skull base and vertebrae: Postsurgical changes at C4-5. There are no acute or destructive bony abnormalities. Soft tissues and spinal canal: No prevertebral fluid or swelling. No visible canal hematoma. Prominent atherosclerosis of the carotid bifurcations. Disc levels: Prior ACDF at the C4-5 level. There is bony fusion across the right C3-4 and C4-5 facet joints. Marked spondylosis at C5-6 and C6-7, with left greater than right neural foraminal encroachment. Upper chest: Airway is patent.  Lung apices are clear. Other: Reconstructed images demonstrate no additional findings. IMPRESSION: 1. No acute cervical spine fracture. 2. Prior C4-5 ACDF, with multilevel spondylosis and facet hypertrophy as above. Electronically Signed   By: Randa Ngo M.D.   On: 07/21/2022 18:02   CT Head Wo Contrast  Result Date: 07/21/2022 CLINICAL DATA:  [place], loss of consciousness EXAM: CT HEAD WITHOUT CONTRAST TECHNIQUE:  Contiguous axial images were obtained from the base of the skull through the vertex without intravenous contrast. RADIATION DOSE REDUCTION: This exam was performed according to  the departmental dose-optimization program which includes automated exposure control, adjustment of the mA and/or kV according to patient size and/or use of iterative reconstruction technique. COMPARISON:  06/17/2022 FINDINGS: Brain: No acute infarct or hemorrhage. Lateral ventricles and midline structures are unremarkable. No acute extra-axial fluid collections. No mass effect. Vascular: No hyperdense vessel or unexpected calcification. Skull: Normal. Negative for fracture or focal lesion. Sinuses/Orbits: No acute finding. Other: None. IMPRESSION: 1. Stable head CT, no acute intracranial process. Electronically Signed   By: Randa Ngo M.D.   On: 07/21/2022 17:59   DG Ribs Unilateral W/Chest Left  Result Date: 07/21/2022 CLINICAL DATA:  Multiple falls today with left-sided chest pain, initial encounter EXAM: LEFT RIBS AND CHEST - 3+ VIEW COMPARISON:  05/21/2022 FINDINGS: Check shadow is within normal limits. Aortic calcifications are seen. The lungs are hypoinflated. No infiltrate, effusion or pneumothorax is seen. Undisplaced posterolateral left eighth rib fracture is seen. No other fractures are seen. IMPRESSION: Left eighth rib fracture as described without complicating factors. Electronically Signed   By: Inez Catalina M.D.   On: 07/21/2022 17:59      Signature  -   Lala Lund M.D on 07/23/2022 at 12:34 PM   -  To page go to [email or web address]

## 2022-07-23 NOTE — Progress Notes (Signed)
Physical Therapy Treatment Patient Details Name: Alan Lin MRN: [id number] DOB: [birth year] Today's Date: 07/23/2022   History of Present Illness 67 y/o M admitted on 2/1 with falls and loss of consciousness. Pt had 3 episodes of syncope, imaging reveals fractures of L 6, 7, 8 ribs. PMHx: DM, HTN, falls, unsteady gait followed by neurology.    PT Comments    Pt tolerated today's session fairly, fatiguing and limited by pain and nausea but motivated to perform mobility. Pt continuing to require modA for bed mobility, progressing to minA for sit<>stand transfers, performing 5 reps, but unable to ambulate due to nausea and pain. Pt performed side stepping along EOB with min guard and use of RW. Discussed and educated pt on discharge recommendations, updated to SNF today as pt will require more assistance than he has available at home at his current mobility level and pt in agreement. Pt will continue to benefit from skilled acute PT at this time to progress mobility and activity tolerance, discharge recommendations updated below.     Recommendations for follow up therapy are one component of a multi-disciplinary discharge planning process, led by the attending physician.  Recommendations may be updated based on patient status, additional functional criteria and insurance authorization.  Follow Up Recommendations  Skilled nursing-short term rehab (<3 hours/day) Can patient physically be transported by private vehicle: No   Assistance Recommended at Discharge Frequent or constant Supervision/Assistance  Patient can return home with the following A little help with walking and/or transfers;Assist for transportation;Help with stairs or ramp for entrance;Assistance with cooking/housework   Equipment Recommendations  None recommended by PT    Recommendations for Other Services       Precautions / Restrictions Precautions Precautions: Fall Precaution Comments: L rib fx's Required Braces or  Orthoses:  (pt has a soft cervical collar he uses intermittently for comfort) Restrictions Weight Bearing Restrictions: No     Mobility  Bed Mobility Overal bed mobility: Needs Assistance Bed Mobility: Supine to Sit     Supine to sit: Mod assist, HOB elevated Sit to supine: Mod assist   General bed mobility comments: use of bedrails, assist required for technique and sequencing. For supine>sit trunk support required, BLE management for sit>supine.    Transfers Overall transfer level: Needs assistance Equipment used: Rolling walker (2 wheels) Transfers: Sit to/from Stand Sit to Stand: Min assist           General transfer comment: minA for sit<>stand today, 5 reps performed from bed. Cueing provided for proper hand placement    Ambulation/Gait             Pre-gait activities: side stepping along EOB with min guard and RW, pt limited by fatigue and nausea     Stairs             Wheelchair Mobility    Modified Rankin (Stroke Patients Only)       Balance Overall balance assessment: Needs assistance Sitting-balance support: Feet supported, Single extremity supported Sitting balance-Leahy Scale: Fair Sitting balance - Comments: UE support with sitting balance, supervision for safety, no sway noted   Standing balance support: Reliant on assistive device for balance, During functional activity Standing balance-Leahy Scale: Poor Standing balance comment: Pt utilizing RW for standing balance, min guard while in standing provided for safety                            Cognition Arousal/Alertness: Awake/alert Behavior During Therapy:  WFL for tasks assessed/performed Overall Cognitive Status: Within Functional Limits for tasks assessed                                 General Comments: Cooperative throughout session        Exercises      General Comments General comments (skin integrity, edema, etc.): SPO2 stable on room air  90-94%, HR 120-130s, pt nauseous throughout intermittently requesting bucket but no emesis episodes during today's session      Pertinent Vitals/Pain Pain Assessment Pain Assessment: Faces Faces Pain Scale: Hurts even more Pain Location: ribs Pain Descriptors / Indicators: Sharp Pain Intervention(s): Monitored during session, Repositioned    Home Living                          Prior Function            PT Goals (current goals can now be found in the care plan section) Acute Rehab PT Goals Patient Stated Goal: get better PT Goal Formulation: With patient Time For Goal Achievement: 08/05/22 Potential to Achieve Goals: Good Progress towards PT goals: Progressing toward goals    Frequency    Min 3X/week      PT Plan Discharge plan needs to be updated    Co-evaluation              AM-PAC PT "6 Clicks" Mobility   Outcome Measure  Help needed turning from your back to your side while in a flat bed without using bedrails?: A Lot Help needed moving from lying on your back to sitting on the side of a flat bed without using bedrails?: A Lot Help needed moving to and from a bed to a chair (including a wheelchair)?: A Lot Help needed standing up from a chair using your arms (e.g., wheelchair or bedside chair)?: A Little Help needed to walk in hospital room?: A Little Help needed climbing 3-5 steps with a railing? : A Lot 6 Click Score: 14    End of Session   Activity Tolerance: Patient tolerated treatment well Patient left: in bed;with call bell/phone within reach Nurse Communication: Mobility status PT Visit Diagnosis: Repeated falls (R29.6);Difficulty in walking, not elsewhere classified (R26.2)     Time: 0737-1062 PT Time Calculation (min) (ACUTE ONLY): 29 min  Charges:  $Therapeutic Activity: 23-37 mins                     Charlynne Cousins, PT DPT Acute Rehabilitation Services Office [phone]    Luvenia Heller 07/23/2022, 4:37 PM

## 2022-07-23 NOTE — Progress Notes (Addendum)
  X-cover Note: Call from bedside RN. Pt tachycardic, irregular. EKG shows rapid afib. K 3.0, Mg 1.7.  Replete both mag and potassium. Start cardizem gtts. CHADVASC2 score of 3. Will give 1 dose of lovenox. Defer oral AC to rounding attending.   On review. Pt's admission EKG also shows afib. No mention of this in H&P.  Veto Kemps, DO    07/23/2022 5:49 AM    Kristopher Oppenheim, DO [facility]

## 2022-07-23 NOTE — Progress Notes (Signed)
  X-cover Note: Pt with N/V tonight. KUB shows distended stomach. CT abd/pelvis ordered. Pt made NPO and IV started.   Kristopher Oppenheim, DO [facility]

## 2022-07-23 NOTE — Progress Notes (Addendum)
Earlier in shift, pt requested senokot for constipation. Order received and senokot administered. A few hours later, pt reported to CNA that his burps were painful and wanted a medication for it. When RN went to assess pt, pt suddenly and forcefully vomited a large amount of brown liquid. He reported feeling better afterwards. Order for zofran received and administered. About an hour later, pt began vomiting again. Vomiting continued off and on for several hours. Pt also having diarrhea. Dr. Bridgett Larsson aware of pt condition. KUB completed, CT abd ordered.

## 2022-07-23 NOTE — TOC CAGE-AID Note (Signed)
Transition of Care [facility]) - CAGE-AID Screening   Patient Details  Name: Alan Lin MRN: [id number] Date of Birth: 1956-03-17  Transition of Care [facility]) CM/SW Contact:    Benard Halsted, LCSW Phone Number: 07/23/2022, 12:15 PM   Clinical Narrative: Patient declined Substance/ETOH use. Resources not given.    CAGE-AID Screening:    Have You Ever Felt You Ought to Cut Down on Your Drinking or Drug Use?: No Have People Annoyed You By Critizing Your Drinking Or Drug Use?: No Have You Felt Bad Or Guilty About Your Drinking Or Drug Use?: No Have You Ever Had a Drink or Used Drugs First Thing In The Morning to Steady Your Nerves or to Get Rid of a Hangover?: No CAGE-AID Score: 0  Substance Abuse Education Offered: No

## 2022-07-24 DIAGNOSIS — K209 Esophagitis, unspecified without bleeding: Secondary | ICD-10-CM

## 2022-07-24 DIAGNOSIS — R55 Syncope and collapse: Secondary | ICD-10-CM | POA: Diagnosis not present

## 2022-07-24 LAB — CBC WITH DIFFERENTIAL/PLATELET
Abs Immature Granulocytes: 0.12 10*3/uL — ABNORMAL HIGH (ref 0.00–0.07)
Basophils Absolute: 0.1 10*3/uL (ref 0.0–0.1)
Basophils Relative: 0 %
Eosinophils Absolute: 0.1 10*3/uL (ref 0.0–0.5)
Eosinophils Relative: 1 %
HCT: 42.5 % (ref 39.0–52.0)
Hemoglobin: 14.7 g/dL (ref 13.0–17.0)
Immature Granulocytes: 1 %
Lymphocytes Relative: 15 %
Lymphs Abs: 2.2 10*3/uL (ref 0.7–4.0)
MCH: 28.9 pg (ref 26.0–34.0)
MCHC: 34.6 g/dL (ref 30.0–36.0)
MCV: 83.7 fL (ref 80.0–100.0)
Monocytes Absolute: 1.3 10*3/uL — ABNORMAL HIGH (ref 0.1–1.0)
Monocytes Relative: 9 %
Neutro Abs: 11 10*3/uL — ABNORMAL HIGH (ref 1.7–7.7)
Neutrophils Relative %: 74 %
Platelets: 269 10*3/uL (ref 150–400)
RBC: 5.08 MIL/uL (ref 4.22–5.81)
RDW: 13 % (ref 11.5–15.5)
WBC: 14.7 10*3/uL — ABNORMAL HIGH (ref 4.0–10.5)
nRBC: 0 % (ref 0.0–0.2)

## 2022-07-24 LAB — GLUCOSE, CAPILLARY
Glucose-Capillary: 175 mg/dL — ABNORMAL HIGH (ref 70–99)
Glucose-Capillary: 191 mg/dL — ABNORMAL HIGH (ref 70–99)
Glucose-Capillary: 197 mg/dL — ABNORMAL HIGH (ref 70–99)
Glucose-Capillary: 203 mg/dL — ABNORMAL HIGH (ref 70–99)
Glucose-Capillary: 209 mg/dL — ABNORMAL HIGH (ref 70–99)
Glucose-Capillary: 232 mg/dL — ABNORMAL HIGH (ref 70–99)

## 2022-07-24 LAB — COMPREHENSIVE METABOLIC PANEL
ALT: 28 U/L (ref 0–44)
AST: 14 U/L — ABNORMAL LOW (ref 15–41)
Albumin: 3.1 g/dL — ABNORMAL LOW (ref 3.5–5.0)
Alkaline Phosphatase: 48 U/L (ref 38–126)
Anion gap: 8 (ref 5–15)
BUN: 16 mg/dL (ref 8–23)
CO2: 25 mmol/L (ref 22–32)
Calcium: 8.9 mg/dL (ref 8.9–10.3)
Chloride: 100 mmol/L (ref 98–111)
Creatinine, Ser: 0.74 mg/dL (ref 0.61–1.24)
GFR, Estimated: >60 mL/min (ref >60)
Glucose, Bld: 188 mg/dL — ABNORMAL HIGH (ref 70–99)
Potassium: 3.7 mmol/L (ref 3.5–5.1)
Sodium: 133 mmol/L — ABNORMAL LOW (ref 135–145)
Total Bilirubin: 1.2 mg/dL (ref 0.3–1.2)
Total Protein: 5.6 g/dL — ABNORMAL LOW (ref 6.5–8.1)

## 2022-07-24 LAB — MAGNESIUM: Magnesium: 2.1 mg/dL (ref 1.7–2.4)

## 2022-07-24 LAB — BRAIN NATRIURETIC PEPTIDE: B Natriuretic Peptide: 86.8 pg/mL (ref 0.0–100.0)

## 2022-07-24 NOTE — NC FL2 (Signed)
[place] LEVEL OF CARE FORM     IDENTIFICATION  Patient Name: Alan Lin Birthdate: 06-29-1955 Sex: male Admission Date (Current Location): 07/21/2022  [facility] and [place] Number:  Herbalist and Address:  The [place] [place], [place] [address] [place], [place] [address]      Provider Number: [id number]  Attending Physician Name and Address:  Thurnell Lose, MD  Relative Name and Phone Number:  Nicki Reaper [phone]    Current Level of Care:   Recommended Level of Care:   Prior Approval Number:    Date Approved/Denied:   PASRR Number: [id number]  Discharge Plan: SNF    Current Diagnoses: Patient Active Problem List   Diagnosis Date Noted   Acute esophagitis 07/24/2022   Syncope 07/21/2022   Hypomagnesemia 07/21/2022   Hypokalemia 07/21/2022   Hyperlipidemia 07/21/2022   Essential hypertension 07/21/2022   Hypophosphatemia 07/21/2022   OSA (obstructive sleep apnea) 07/21/2022   Falls 07/21/2022   Rib fracture 07/21/2022   Degeneration of lumbar intervertebral disc 11/01/2019   DDD (degenerative disc disease), cervical 10/28/2019   Cervical radiculitis 09/20/2019   Displacement 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 09/20/2019   Anxiety 07/16/2019   Right hip pain 07/30/2018   Primary osteoarthritis of both knees 07/30/2018   Pain in buttock 07/30/2018   Abdominal weakness 07/24/2017   Pain in joint, shoulder region 11/29/2016   Diabetes mellitus with no complication ([place]) 03/50/0938    Orientation RESPIRATION BLADDER Height & Weight     Self, Time, Situation, Place  Normal Continent Weight: 248 lb 10.9 oz (112.8 kg) Height:  5\' 10"  (177.8 cm)  BEHAVIORAL SYMPTOMS/MOOD NEUROLOGICAL BOWEL NUTRITION STATUS      Continent    AMBULATORY STATUS COMMUNICATION OF NEEDS Skin   Limited Assist Verbally Normal                       Personal Care Assistance Level of Assistance  Bathing, Feeding, Dressing  Bathing Assistance: Limited assistance Feeding assistance: Independent Dressing Assistance: Limited assistance     Functional Limitations Info  Sight, Hearing, Speech Sight Info: Adequate Hearing Info: Adequate Speech Info: Adequate    SPECIAL CARE FACTORS FREQUENCY  PT (By licensed PT), OT (By licensed OT)     PT Frequency: 5x per week OT Frequency: 5x per week            Contractures Contractures Info: Not present    Additional Factors Info  Code Status, Allergies Code Status Info: full Allergies Info: oxycodone-constipation, lisinopril-cough metformin-diarhea, nausea, vomiting, morphine-itching           Current Medications (07/24/2022):  This is the current hospital active medication list Current Facility-Administered Medications  Medication Dose Route Frequency Provider Last Rate Last Admin   acetaminophen (TYLENOL) tablet 650 mg  650 mg Oral Q6H PRN Toy Baker, MD   650 mg at 07/21/22 2244   Or   acetaminophen (TYLENOL) suppository 650 mg  650 mg Rectal Q6H PRN Doutova, Anastassia, MD       DULoxetine (CYMBALTA) DR capsule 40 mg  40 mg Oral Daily Doutova, Anastassia, MD   40 mg at 07/24/22 1019   fentaNYL (DURAGESIC) 25 MCG/HR 1 patch  1 patch Transdermal Q72H Thurnell Lose, MD   1 patch at 07/23/22 0845   gabapentin (NEURONTIN) capsule 300 mg  300 mg Oral Daily Doutova, Anastassia, MD   300 mg at 07/24/22 0849   HYDROmorphone (DILAUDID) injection 0.5  mg  0.5 mg Intravenous Q4H PRN Lala Lund K, MD   0.5 mg at 07/24/22 1541   insulin aspart (novoLOG) injection 0-9 Units  0-9 Units Subcutaneous Q4H Doutova, Nyoka Lint, MD   3 Units at 07/24/22 1325   metoCLOPramide (REGLAN) injection 10 mg  10 mg Intravenous Q8H Esterwood, Amy S, PA-C   10 mg at 07/24/22 1513   metoprolol tartrate (LOPRESSOR) tablet 50 mg  50 mg Oral BID Thurnell Lose, MD   50 mg at 07/24/22 0849   ondansetron (ZOFRAN) injection 4 mg  4 mg Intravenous Q6H Esterwood, Amy S, PA-C    4 mg at 07/24/22 0209   pantoprazole (PROTONIX) injection 40 mg  40 mg Intravenous Q12H Thurnell Lose, MD   40 mg at 07/24/22 0849   senna (SENOKOT) tablet 17.2 mg  2 tablet Oral Daily Kristopher Oppenheim, DO   17.2 mg at 07/24/22 0850   simvastatin (ZOCOR) tablet 20 mg  20 mg Oral Daily Doutova, Anastassia, MD   20 mg at 07/24/22 0850   traMADol (ULTRAM) tablet 50 mg  50 mg Oral Q6H PRN Toy Baker, MD   50 mg at 07/23/22 2159     Discharge Medications: Please see discharge summary for a list of discharge medications.  Relevant Imaging Results:  Relevant Lab Results:   Additional Information SS# [id number]  Elliot Gurney [place],

## 2022-07-24 NOTE — Progress Notes (Addendum)
PROGRESS NOTE                                                                                                                                                                                                             Patient Demographics:    Alan Lin, is a 67 y.o. male, DOB - 05/17/1956, BJY:[id number]  Outpatient Primary MD for the patient is Doug SouWeil, Amy B, MD    LOS - 2  Admit date - 07/21/2022    Chief Complaint  Patient presents with   Fall   Loss of Consciousness       Brief Narrative (HPI from H&P)   67 y.o. male with medical history significant of Dm2, HTN, falls.  Resented to the ER after an episode of diarrhea last night after which she took 1 Imodium, thereafter he went to the bathroom 4 AM in the morning where he syncopized once and twice again thereafter, he was slightly lightheaded during these episodes but did not have any chest pain or shortness of breath.  He recently had a C-spine surgery for which she wears a c-collar, during his falls he did hurt his left-sided rib cage.  Brought to the ER where head CT and C-spine CT were stable, CTA chest was stable, he did have fractures on his left rib cage, he was admitted for further workup for syncope.    Subjective:   Patient in bed, appears comfortable, denies any headache, no fever, no chest pain or pressure, no shortness of breath , no abdominal pain. No new focal weakness.   Assessment  & Plan :   Multiple falls after syncope and lightheadedness in a patient with previous history of falls.  Unclear reason but could be due to orthostatic hypotension caused by an episode of diarrhea the night of admission.  CT head unremarkable, no focal deficits, EEG, echocardiogram and carotid venous duplex are stable, stable on telemetry, stable orthostatics, seen by PT OT qualifies for SNF, hydrated IV fluids.  Much improved.  Have also ordered 30-day event monitor upon  discharge.  Peripheral neuropathy.  Noted on EMG studies done outpatient recently.  Outpatient follow-up with his neurologist, PT OT here, may require placement.    Gastroenteritis.  Questionable food poisoning at night on 07/22/2022, CT abdomen pelvis suggests possible esophagitis, placed on twice daily PPI and supportive care  with antinausea medicines and Reglan for a few days, GI on board.  No EGD for now per GI.  Chronic neck pain and C-spine surgery.  Continue c-collar follow-up with orthopedic surgeon postdischarge.  Dehydration with hypomagnesemia, hypophosphatemia and hypokalemia.  IV fluids, replace electrolytes.  Left rib cage fractures.  Supportive care.  Pain medications adjusted for better control on 07/23/2022.  OSA.  CPAP at night.  Hypertension.  Pressure stable placed on beta-blocker.  Sinus tachycardia with PACs.  Seen by cardiologist Dr. Wyline Mood ruled out atrial fibrillation, IV fluids, beta-blocker, treat nausea vomiting, hydrate with IV fluids, stable TSH & echo.  Mild metabolic encephalopathy.  At times getting confused.  Has no recollection of seeing me yesterday, was upset that the doctor did not come and update him at 2 AM in the morning for his nausea vomiting.  Brother confirms that he has some memory issues and can get confused from time to time.  Minimize narcotics and benzodiazepines.  At risk for delirium.      Condition - Fair  Family Communication  : Brother Alan Lin [phone] updated in detail on 07/23/2022, 07/24/22.  Code Status :  Full  Consults  :  Cards  PUD Prophylaxis :    Procedures  :     CT abdomen pelvis.  1. Bilateral nonobstructive nephrolithiasis. 2. Acute fractures of the left seventh, eighth, and ninth ribs, with the seventh rib fracture displaced by about 2 mm. These fractures were reported on prior CTA chest of 07/21/2022. 3. Circumferential distal esophageal wall thickening is nonspecific but distal esophagitis would be a common cause for  this appearance. 4. Diffuse hepatic steatosis. 5. Lower lumbar degenerative disc disease causing bilateral foraminal impingement at L4-5 and L5-S1. 6. 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 7. Aortic atherosclerosis. Aortic Atherosclerosis.  EEG - Non acute  Carotid Doppler.  Non acute  TTE - 1. Windows not well visualized. Cannot assess wall motion. Left ventricular ejection fraction, by estimation, is 60 to 65%. The left ventricle has normal function.  2. Right ventricular systolic function is normal. The right ventricular size is normal.  3. No evidence of mitral valve regurgitation.  4. Aortic valve regurgitation is not visualized.  5. The inferior vena cava is normal in size with greater than 50% respiratory variability, suggesting right atrial pressure of 3 mmHg.  CTA -  Fractures of the left sixth, seventh and eighth ribs without complicating factors. No evidence of pulmonary emboli. Mild right basilar atelectasis. Aortic Atherosclerosis  CT Head - Non acute  CT C Spine - 1. No acute cervical spine fracture. 2. Prior C4-5 ACDF, with multilevel spondylosis and facet hypertrophy as above.       Disposition Plan  :    Status is: Observation  DVT Prophylaxis  :    SCDs Start: 07/21/22 2209     Lab Results  Component Value Date   PLT 269 07/24/2022    Diet :  Diet Order             Diet clear liquid Room service appropriate? Yes; Fluid consistency: Thin  Diet effective now                    Inpatient Medications  Scheduled Meds:  DULoxetine  40 mg Oral Daily   fentaNYL  1 patch Transdermal Q72H   gabapentin  300 mg Oral Daily   insulin aspart  0-9 Units Subcutaneous  Q4H   metoCLOPramide (REGLAN) injection  10 mg Intravenous Q8H   metoprolol tartrate  50 mg Oral BID   ondansetron (ZOFRAN) IV  4 mg Intravenous Q6H   pantoprazole (PROTONIX) IV  40 mg Intravenous Q12H   senna   2 tablet Oral Daily   simvastatin  20 mg Oral Daily   Continuous Infusions:  lactated ringers 1,000 mL with potassium chloride 20 mEq infusion 75 mL/hr at 07/24/22 0614   PRN Meds:.acetaminophen **OR** acetaminophen, HYDROmorphone (DILAUDID) injection, traMADol  Antibiotics  :    Anti-infectives (From admission, onward)    None         Objective:   Vitals:   07/24/22 0022 07/24/22 0330 07/24/22 0500 07/24/22 0757  BP:  (!) 144/78  137/82  Pulse:  84  79  Resp:  16  14  Temp:  97.7 F (36.5 C)  (!) 97.4 F (36.3 C)  TempSrc:  Oral  Oral  SpO2: 94%  92% 94%  Weight:      Height:        Wt Readings from Last 3 Encounters:  07/21/22 112.8 kg  07/05/22 117.9 kg  05/21/22 117.9 kg     Intake/Output Summary (Last 24 hours) at 07/24/2022 0912 Last data filed at 07/23/2022 1904 Gross per 24 hour  Intake 899.09 ml  Output 1580 ml  Net -680.91 ml     Physical Exam  Awake Alert, No new F.N deficits, Normal affect Arabi.AT,PERRAL Supple Neck, No JVD, C Collar in place  Symmetrical Chest wall movement, Good air movement bilaterally, CTAB RRR,No Gallops,Rubs or new Murmurs,  +ve B.Sounds, Abd Soft, No tenderness,   No Cyanosis, Clubbing or edema      Data Review:    Recent Labs  Lab 07/21/22 1710 07/22/22 0702 07/23/22 0334 07/24/22 0308  WBC 11.7* 11.2* 11.7* 14.7*  HGB 13.1 14.2 16.0 14.7  HCT 37.9* 40.7 46.1 42.5  PLT 217 263 254 269  MCV 83.8 82.7 83.1 83.7  MCH 29.0 28.9 28.8 28.9  MCHC 34.6 34.9 34.7 34.6  RDW 13.2 13.2 13.0 13.0  LYMPHSABS 0.9  --  1.0 2.2  MONOABS 0.6  --  0.8 1.3*  EOSABS 0.0  --  0.0 0.1  BASOSABS 0.1  --  0.1 0.1    Recent Labs  Lab 07/21/22 1710 07/21/22 1907 07/21/22 1940 07/21/22 2216 07/22/22 0135 07/22/22 0434 07/22/22 0702 07/22/22 0704 07/23/22 0334 07/24/22 0308  NA 134*  --   --   --  133*  --  137  --  136 133*  K 2.7*  --   --   --  3.9  --  3.6  --  3.0* 3.7  CL 102  --   --   --  100  --  101  --  98  100  CO2 19*  --   --   --  21*  --  28  --  24 25  ANIONGAP 13  --   --   --  12  --  8  --  14 8  GLUCOSE 280*  --   --   --  258*  --  166*  --  234* 188*  BUN 24*  --   --   --  24*  --  22  --  15 16  CREATININE 0.82  --   --   --  1.02  --  0.97  --  0.85 0.74  AST 46*  --   --   --   --   --  26  --  30 14*  ALT 37  --   --   --   --   --  31  --  37 28  ALKPHOS 45  --   --   --   --   --  46  --  50 48  BILITOT 1.0  --   --   --   --   --  0.7  --  1.1 1.2  ALBUMIN 3.1*  --   --   --   --   --  3.1*  --  3.4* 3.1*  DDIMER 1.43*  --   --   --   --   --   --   --   --   --   LATICACIDVEN  --   --  1.7 2.2* 2.4* 1.9  --   --   --   --   INR 1.2  --   --   --   --   --   --   --   --   --   TSH  --  0.453  --   --   --   --   --   --   --   --   HGBA1C 8.0*  --   --   --   --   --   --   --   --   --   BNP  --   --   --   --   --   --   --  67.8 75.0 86.8  MG 1.6*  --   --   --   --   --  2.1  --  1.8 2.1  CALCIUM 8.4*  --   --   --  8.9  --  9.0  --  9.6 8.9    Lab Results  Component Value Date   HGBA1C 8.0 (H) 07/21/2022    Recent Labs    07/21/22 1907  TSH 0.453   Radiology Reports CT ABDOMEN PELVIS WO CONTRAST  Result Date: 07/23/2022 CLINICAL DATA:  Abdominal pain EXAM: CT ABDOMEN AND PELVIS WITHOUT CONTRAST TECHNIQUE: Multidetector CT imaging of the abdomen and pelvis was performed following the standard protocol without IV contrast. RADIATION DOSE REDUCTION: This exam was performed according to the departmental dose-optimization program which includes automated exposure control, adjustment of the mA and/or kV according to patient size and/or use of iterative reconstruction technique. COMPARISON:  05/21/2022 FINDINGS: Lower chest: Coronary and descending thoracic aortic atherosclerosis. Stable scarring in the right lower lobe. Circumferential distal esophageal wall thickening is nonspecific but distal esophagitis would be a common cause for this appearance. Mildly elevated  right hemidiaphragm. Hepatobiliary: Diffuse hepatic steatosis. Gallbladder unremarkable. No biliary dilatation. Pancreas: Unremarkable Spleen: Unremarkable Adrenals/Urinary Tract: Both adrenal glands appear normal. Spill to nonobstructive right renal calculi, the largest in the right kidney upper pole measuring 0.7 cm in long axis. Three nonobstructive left renal calculi, the largest measuring 0.6 cm in long axis in the left kidney upper pole. No hydronephrosis or hydroureter. Moderately accentuated density of fluid in the urinary bladder, probably from previous contrast administration. Adrenal glands unremarkable. Stomach/Bowel: Unremarkable.  Normal appendix. Vascular/Lymphatic: Atherosclerosis is present, including aortoiliac atherosclerotic disease. 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 Reproductive: Unremarkable Other: No supplemental non-categorized findings. Musculoskeletal: Acute fractures of the left seventh, eighth, and ninth ribs, with the seventh rib fracture displaced  by about 2 mm. These fractures were reported on prior CTA chest of 07/21/2022. Bridging spurring of the right sacroiliac joint. Thoracic and lumbar spondylosis with lower lumbar degenerative disc disease, resulting in bilateral foraminal impingement at L5-S1 and L4-5. IMPRESSION: 1. Bilateral nonobstructive nephrolithiasis. 2. Acute fractures of the left seventh, eighth, and ninth ribs, with the seventh rib fracture displaced by about 2 mm. These fractures were reported on prior CTA chest of 07/21/2022. 3. Circumferential distal esophageal wall thickening is nonspecific but distal esophagitis would be a common cause for this appearance. 4. Diffuse hepatic steatosis. 5. Lower lumbar degenerative disc disease causing bilateral foraminal impingement at L4-5 and L5-S1. 6. Substantial atheromatous plaque proximally in the superior mesenteric artery, this was  not occlusive on the CT of 08/01/2021. 7. Aortic atherosclerosis. Aortic Atherosclerosis (ICD10-I70.0). Electronically Signed   By: Van Clines M.D.   On: 07/23/2022 12:38   VAS US CAROTID  Result Date: 07/23/2022 Carotid Arterial Duplex Study Patient Name:  Alan Lin  Date of Exam:   07/22/2022 Medical Rec #: [id number]         Accession #:    [id number] Date of Birth: [birth year]          Patient Gender: M Patient Age:   31 years Exam Location:  [facility] Procedure:      VAS US CAROTID Referring Phys: Nyoka Lint DOUTOVA --------------------------------------------------------------------------------  Indications:       Syncope. Risk Factors:      Hypertension, hyperlipidemia, Diabetes, no history of                    smoking. Limitations        Today's exam was limited due to Calcific shadowing, poor                    ultrasound tissue/interface and the body habitus of the                    patient. Comparison Study:  No previous exams Performing Technologist: Jody Hill RVT, RDMS  Examination Guidelines: A complete evaluation includes B-mode imaging, spectral Doppler, color Doppler, and power Doppler as needed of all accessible portions of each vessel. Bilateral testing is considered an integral part of a complete examination. Limited examinations for reoccurring indications may be performed as noted.  Right Carotid Findings: +----------+-------+--------+--------+----------------------+------------------+           PSV    EDV cm/sStenosisPlaque Description    Comments                     cm/s                                                            +----------+-------+--------+--------+----------------------+------------------+ CCA Prox  59     9               hyperechoic and       intimal thickening                                  calcific                                 +----------+-------+--------+--------+----------------------+------------------+  CCA  Distal51     6                                     intimal thickening +----------+-------+--------+--------+----------------------+------------------+ ICA Prox  39     7               calcific              Shadowing          +----------+-------+--------+--------+----------------------+------------------+ ICA Distal53     13              calcific                                 +----------+-------+--------+--------+----------------------+------------------+ ECA       123    9               calcific                                 +----------+-------+--------+--------+----------------------+------------------+ +----------+--------+-------+----------------+-------------------+           PSV cm/sEDV cmsDescribe        Arm Pressure (mmHG) +----------+--------+-------+----------------+-------------------+ [id number]             Multiphasic, WNL                    +----------+--------+-------+----------------+-------------------+ +---------+--------+--+--------+--+---------+ VertebralPSV cm/s54EDV cm/s11Antegrade +---------+--------+--+--------+--+---------+  Left Carotid Findings: +----------+-------+--------+--------+-----------------------+-----------------+           PSV    EDV cm/sStenosisPlaque Description     Comments                    cm/s                                                            +----------+-------+--------+--------+-----------------------+-----------------+ CCA Prox  88     8               calcific               intimal                                                                   thickening        +----------+-------+--------+--------+-----------------------+-----------------+ CCA Distal85     11              heterogenous and       intimal                                            calcific               thickening        +----------+-------+--------+--------+-----------------------+-----------------+  ICA Prox  58  14              calcific               Shadowing         +----------+-------+--------+--------+-----------------------+-----------------+ ICA Distal50     16                                                       +----------+-------+--------+--------+-----------------------+-----------------+ ECA       134                                           shadowing         +----------+-------+--------+--------+-----------------------+-----------------+ +----------+--------+--------+----------------+-------------------+           PSV cm/sEDV cm/sDescribe        Arm Pressure (mmHG) +----------+--------+--------+----------------+-------------------+ [id number]             Multiphasic, WNL                    +----------+--------+--------+----------------+-------------------+ +---------+--------+--+--------+--------------+ VertebralPSV cm/s32EDV cm/sHigh resistant +---------+--------+--+--------+--------------+   Summary: Right Carotid: Velocities in the right ICA are consistent with a 1-39% stenosis. Left Carotid: Velocities in the left ICA are consistent with a 1-39% stenosis.               The extracranial vessels were near-normal with only minimal wall               thickening or plaque. Vertebrals:  Right vertebral artery demonstrates antegrade flow. Left vertebral              artery demonstrates high resistant flow. Subclavians: Normal flow hemodynamics were seen in bilateral subclavian              arteries. *See table(s) above for measurements and observations.  Electronically signed by Gerarda Fraction on 07/23/2022 at 10:34:35 AM.    Final    DG Abd 1 View  Result Date: 07/23/2022 CLINICAL DATA:  Vomiting with nausea EXAM: ABDOMEN - 1 VIEW COMPARISON:  None Available. FINDINGS: The stomach is distended. Otherwise limited visualization of bowel gas pattern due to body habitus. IMPRESSION: No dilated small bowel visible, but study limited by body habitus.  Electronically Signed   By: Deatra Robinson M.D.   On: 07/23/2022 03:24   EEG adult  Result Date: 07/22/2022 Charlsie Quest, MD     07/22/2022  5:01 PM Patient Name: PRIDE GONZALES MRN: [id number] Epilepsy Attending: Charlsie Quest Referring Physician/Provider: Leroy Sea, MD Date: 07/22/2022 Duration: 25.06 mins Patient history: 67yo M with syncope. EEG to evaluate for seizure Level of alertness: Awake AEDs during EEG study: None Technical aspects: This EEG study was done with scalp electrodes positioned according to the 10-20 International system of electrode placement. Electrical activity was reviewed with band pass filter of 1-70Hz , sensitivity of 7 uV/mm, display speed of 74mm/sec with a 60Hz  notched filter applied as appropriate. EEG data were recorded continuously and digitally stored.  Video monitoring was available and reviewed as appropriate. Description: The posterior dominant rhythm consists of 9 Hz activity of moderate voltage (25-35 uV) seen predominantly in posterior head regions, symmetric and reactive to eye opening and eye closing. Physiologic photic driving was seen during  photic stimulation.  Hyperventilation was not performed.   IMPRESSION: This study is within normal limits. No seizures or epileptiform discharges were seen throughout the recording. A normal interictal EEG does not exclude  the diagnosis of epilepsy. Lora Havens   ECHOCARDIOGRAM COMPLETE  Result Date: 07/22/2022    ECHOCARDIOGRAM REPORT   Patient Name:   Alan Lin Date of Exam: 07/22/2022 Medical Rec #:  [id number]        Height:       70.0 in Accession #:    [id number]       Weight:       248.7 lb Date of Birth:  May 23, 1956         BSA:          2.290 m Patient Age:    19 years         BP:           117/68 mmHg Patient Gender: M                HR:           89 bpm. Exam Location:  Inpatient Procedure: 2D Echo and Intracardiac Opacification Agent Indications:    syncope  History:        Patient has no prior  history of Echocardiogram examinations.                 Risk Factors:Hypertension, Diabetes and Dyslipidemia.  Sonographer:    Harvie Junior Referring Phys: [id number] ANASTASSIA DOUTOVA  Sonographer Comments: Technically difficult study due to poor echo windows, suboptimal apical window, suboptimal parasternal window, suboptimal subcostal window and patient is obese. Image acquisition challenging due to patient body habitus, Image acquisition challenging due to respiratory motion and broken ribs. IMPRESSIONS  1. Windows not well visualized. Cannot assess wall motion. Left ventricular ejection fraction, by estimation, is 60 to 65%. The left ventricle has normal function.  2. Right ventricular systolic function is normal. The right ventricular size is normal.  3. No evidence of mitral valve regurgitation.  4. Aortic valve regurgitation is not visualized.  5. The inferior vena cava is normal in size with greater than 50% respiratory variability, suggesting right atrial pressure of 3 mmHg. FINDINGS  Left Ventricle: Windows not well visualized. Cannot assess wall motion. Left ventricular ejection fraction, by estimation, is 60 to 65%. The left ventricle has normal function. Definity contrast agent was given IV to delineate the left ventricular endocardial borders. The left ventricular internal cavity size was normal in size. Right Ventricle: The right ventricular size is normal. Right ventricular systolic function is normal. Left Atrium: Left atrial size was not well visualized. Right Atrium: Right atrial size was not well visualized. Pericardium: The pericardium was not well visualized. Mitral Valve: No evidence of mitral valve regurgitation. Tricuspid Valve: Tricuspid valve regurgitation is not demonstrated. Aortic Valve: Aortic valve regurgitation is not visualized. Aortic valve mean gradient measures 3.0 mmHg. Aortic valve peak gradient measures 6.0 mmHg. Aortic valve area, by VTI measures 2.25 cm. Pulmonic Valve:  Pulmonic valve regurgitation is not visualized. Aorta: The aortic root is normal in size and structure. Venous: The inferior vena cava is normal in size with greater than 50% respiratory variability, suggesting right atrial pressure of 3 mmHg. IAS/Shunts: The interatrial septum was not well visualized.  LEFT VENTRICLE PLAX 2D LVIDd:         4.20 cm     Diastology LVIDs:         2.80 cm  LV e' medial:    7.29 cm/s LV PW:         1.00 cm     LV E/e' medial:  7.6 LV IVS:        0.90 cm     LV e' lateral:   8.05 cm/s LVOT diam:     2.30 cm     LV E/e' lateral: 6.9 LV SV:         51 LV SV Index:   22 LVOT Area:     4.15 cm  LV Volumes (MOD) LV vol d, MOD A2C: 75.4 ml LV vol d, MOD A4C: 92.6 ml LV vol s, MOD A2C: 25.3 ml LV vol s, MOD A4C: 28.8 ml LV SV MOD A2C:     50.1 ml LV SV MOD A4C:     92.6 ml LV SV MOD BP:      59.1 ml RIGHT VENTRICLE RV S prime:     16.00 cm/s TAPSE (M-mode): 2.0 cm LEFT ATRIUM           Index LA diam:      3.70 cm 1.62 cm/m LA Vol (A4C): 58.6 ml 25.59 ml/m  AORTIC VALVE                    PULMONIC VALVE AV Area (Vmax):    2.14 cm     PV Vmax:       0.99 m/s AV Area (Vmean):   2.10 cm     PV Peak grad:  3.9 mmHg AV Area (VTI):     2.25 cm AV Vmax:           122.00 cm/s AV Vmean:          81.900 cm/s AV VTI:            0.227 m AV Peak Grad:      6.0 mmHg AV Mean Grad:      3.0 mmHg LVOT Vmax:         62.70 cm/s LVOT Vmean:        41.400 cm/s LVOT VTI:          0.123 m LVOT/AV VTI ratio: 0.54  AORTA Ao Root diam: 3.50 cm MITRAL VALVE MV Area (PHT): 3.57 cm    SHUNTS MV Decel Time: 213 msec    Systemic VTI:  0.12 m MV E velocity: 55.25 cm/s  Systemic Diam: 2.30 cm MV A velocity: 51.45 cm/s MV E/A ratio:  1.07 Photographer signed by Carolan Clines Signature Date/Time: 07/22/2022/9:56:01 AM    Final    CT Angio Chest Pulmonary Embolism (PE) W or WO Contrast  Result Date: 07/22/2022 CLINICAL DATA:  Difficulty breathing EXAM: CT ANGIOGRAPHY CHEST WITH CONTRAST TECHNIQUE:  Multidetector CT imaging of the chest was performed using the standard protocol during bolus administration of intravenous contrast. Multiplanar CT image reconstructions and MIPs were obtained to evaluate the vascular anatomy. RADIATION DOSE REDUCTION: This exam was performed according to the departmental dose-optimization program which includes automated exposure control, adjustment of the mA and/or kV according to patient size and/or use of iterative reconstruction technique. CONTRAST:  37mL OMNIPAQUE IOHEXOL 350 MG/ML SOLN COMPARISON:  Chest x-ray from earlier in the same day. FINDINGS: Cardiovascular: Atherosclerotic calcifications of the thoracic aorta are noted. No aneurysmal dilatation or dissection is noted. No cardiac enlargement is seen. Coronary calcifications are seen. The pulmonary artery shows a normal branching pattern bilaterally. No intraluminal filling defect to suggest pulmonary embolism is seen. Mediastinum/Nodes:  Thoracic inlet is within normal limits. No hilar or mediastinal adenopathy is noted. The esophagus is within normal limits. Lungs/Pleura: Minimal right basilar atelectasis is noted. The lungs are otherwise well aerated. No focal infiltrate or effusion is seen. Upper Abdomen: Visualized upper abdomen is within normal limits. Musculoskeletal: Degenerative changes of the thoracic spine are noted. Mildly displaced fractures of the left sixth, seventh and eighth ribs are noted without complicating factors. Review of the MIP images confirms the above findings. IMPRESSION: Fractures of the left sixth, seventh and eighth ribs without complicating factors. No evidence of pulmonary emboli. Mild right basilar atelectasis. Aortic Atherosclerosis (ICD10-I70.0). Electronically Signed   By: Alcide Clever M.D.   On: 07/22/2022 00:00   CT Cervical Spine Wo Contrast  Result Date: 07/21/2022 CLINICAL DATA:  Larey Seat, loss of consciousness EXAM: CT CERVICAL SPINE WITHOUT CONTRAST TECHNIQUE: Multidetector CT  imaging of the cervical spine was performed without intravenous contrast. Multiplanar CT image reconstructions were also generated. RADIATION DOSE REDUCTION: This exam was performed according to the departmental dose-optimization program which includes automated exposure control, adjustment of the mA and/or kV according to patient size and/or use of iterative reconstruction technique. COMPARISON:  05/21/2022 FINDINGS: Alignment: Alignment is grossly anatomic. Skull base and vertebrae: Postsurgical changes at C4-5. There are no acute or destructive bony abnormalities. Soft tissues and spinal canal: No prevertebral fluid or swelling. No visible canal hematoma. Prominent atherosclerosis of the carotid bifurcations. Disc levels: Prior ACDF at the C4-5 level. There is bony fusion across the right C3-4 and C4-5 facet joints. Marked spondylosis at C5-6 and C6-7, with left greater than right neural foraminal encroachment. Upper chest: Airway is patent.  Lung apices are clear. Other: Reconstructed images demonstrate no additional findings. IMPRESSION: 1. No acute cervical spine fracture. 2. Prior C4-5 ACDF, with multilevel spondylosis and facet hypertrophy as above. Electronically Signed   By: Sharlet Salina M.D.   On: 07/21/2022 18:02   CT Head Wo Contrast  Result Date: 07/21/2022 CLINICAL DATA:  Larey Seat, loss of consciousness EXAM: CT HEAD WITHOUT CONTRAST TECHNIQUE: Contiguous axial images were obtained from the base of the skull through the vertex without intravenous contrast. RADIATION DOSE REDUCTION: This exam was performed according to the departmental dose-optimization program which includes automated exposure control, adjustment of the mA and/or kV according to patient size and/or use of iterative reconstruction technique. COMPARISON:  06/17/2022 FINDINGS: Brain: No acute infarct or hemorrhage. Lateral ventricles and midline structures are unremarkable. No acute extra-axial fluid collections. No mass effect.  Vascular: No hyperdense vessel or unexpected calcification. Skull: Normal. Negative for fracture or focal lesion. Sinuses/Orbits: No acute finding. Other: None. IMPRESSION: 1. Stable head CT, no acute intracranial process. Electronically Signed   By: Sharlet Salina M.D.   On: 07/21/2022 17:59   DG Ribs Unilateral W/Chest Left  Result Date: 07/21/2022 CLINICAL DATA:  Multiple falls today with left-sided chest pain, initial encounter EXAM: LEFT RIBS AND CHEST - 3+ VIEW COMPARISON:  05/21/2022 FINDINGS: Check shadow is within normal limits. Aortic calcifications are seen. The lungs are hypoinflated. No infiltrate, effusion or pneumothorax is seen. Undisplaced posterolateral left eighth rib fracture is seen. No other fractures are seen. IMPRESSION: Left eighth rib fracture as described without complicating factors. Electronically Signed   By: Alcide Clever M.D.   On: 07/21/2022 17:59      Signature  -   Susa Raring M.D on 07/24/2022 at 9:12 AM   -  To page go to [email or web address]

## 2022-07-24 NOTE — TOC Initial Note (Signed)
Transition of Care [facility]) - Initial/Assessment Note    Patient Details  Name: Alan Lin MRN: [id number] Date of Birth: [birth year]  Transition of Care [facility]) CM/SW Contact:    Elliot Gurney [place], [place] Phone Number: 07/24/2022, 3:22 PM  Clinical Narrative:                 Met with patient at bedside to discuss recommendation for SNF. Patient resides in his own home, retired for 1 year. Patient agreeable to SNF, has no preference. Patient states that he has a cain and a rollator walker at home. Per patient, he has has been receiving outpatient PT at [facility] per werek/. Patient's primary care doctor is Maryruth Hancock through [facility]. Phone call to patient's brother- Markeise Mathews while in room to discuss SNF process.  He is agreeable to assisting patient wtih selection once bed offers are received. Pasrr obtained, Fl2 faxed out.  Transition of Care to continue to follow  [place], LCSW Transition of Care    Expected Discharge Plan: Skilled Nursing Facility Barriers to Discharge: Continued Medical Work up   Patient Goals and CMS Choice     Choice offered to / list presented to : Patient      Expected Discharge Plan and Services In-house Referral: Clinical Social Work     Living arrangements for the past 2 months: Single Family Home                                      Prior Living Arrangements/Services Living arrangements for the past 2 months: Single Family Home Lives with:: Self   Do you feel safe going back to the place where you live?: Yes               Activities of Daily Living Home Assistive Devices/Equipment: Cane (specify quad or straight) ADL Screening (condition at time of admission) Patient's cognitive ability adequate to safely complete daily activities?: Yes Is the patient deaf or have difficulty hearing?: No Does the patient have difficulty seeing, even when wearing glasses/contacts?: No Does the patient have difficulty  concentrating, remembering, or making decisions?: No Patient able to express need for assistance with ADLs?: Yes Does the patient have difficulty dressing or bathing?: No Independently performs ADLs?: Yes (appropriate for developmental age) Does the patient have difficulty walking or climbing stairs?: No Weakness of Legs: None Weakness of Arms/Hands: None  Permission Sought/Granted            Permission granted to share info w Relationship: [place] (Brother) [phone] (Home Phone)  Permission granted to share info w Contact Information: [place] (Brother) [phone] (Home Phone)  Emotional Assessment Appearance:: Appears stated age Attitude/Demeanor/Rapport: Engaged Affect (typically observed): Adaptable Orientation: : Oriented to Self, Oriented to Place, Oriented to  Time, Oriented to Situation Alcohol / Substance Use: Not Applicable Psych Involvement: No (comment)  Admission diagnosis:  Syncope and collapse [R55] Syncope [R55] Contusion of forehead, initial encounter [S00.83XA] Closed fracture of one rib of left side, initial encounter [S22.32XA] Patient Active Problem List   Diagnosis Date Noted   Acute esophagitis 07/24/2022   Syncope 07/21/2022   Hypomagnesemia 07/21/2022   Hypokalemia 07/21/2022   Hyperlipidemia 07/21/2022   Essential hypertension 07/21/2022   Hypophosphatemia 07/21/2022   OSA (obstructive sleep apnea) 07/21/2022   Falls 07/21/2022   Rib fracture 07/21/2022   Degeneration of lumbar intervertebral disc 11/01/2019   DDD (degenerative disc disease),  cervical 10/28/2019   Cervical radiculitis 09/20/2019   Displacement 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 09/20/2019   Anxiety 07/16/2019   Right hip pain 07/30/2018   Primary osteoarthritis of both knees 07/30/2018   Pain in buttock 07/30/2018   Abdominal weakness 07/24/2017   Pain in joint, shoulder region 11/29/2016   Diabetes mellitus with no complication ([place])  21/19/4174   PCP:  Esmond Harps, MD Pharmacy:   [id number] - [place], [place] [address] [place] [address] Phone: [phone] Fax: [phone]     Social Determinants of Health (SDOH) Social History: [place]: No Food Insecurity (07/21/2022)  Housing: Low Risk  (07/21/2022)  Transportation Needs: No Transportation Needs (07/21/2022)  Utilities: Not At Risk (07/21/2022)  Tobacco Use: Low Risk  (07/21/2022)   SDOH Interventions:     Readmission Risk Interventions     No data to display

## 2022-07-24 NOTE — Progress Notes (Signed)
Patient ID: Alan Lin, male   DOB: [birth year], 67 y.o.   MRN: [id number]    Progress Note   Subjective   Day # 3  CC; nausea and vomiting, probable esophagitis on CT, diarrhea  WBC 14.7, hemoglobin 14.7/hematocrit 42.5 Sodium 133/potassium 3.7/creatinine 0.74 BNP 86  Patient says he feels much better today nausea has resolved, no further vomiting he has tolerated Jell-O and clear liquids without difficulty, no complaints of abdominal pain.  Still having back pain and rib pain.  No diarrhea today   Objective   Vital signs in last 24 hours: Temp:  [97.4 F (36.3 C)-98.2 F (36.8 C)] 97.4 F (36.3 C) (02/04 0757) Pulse Rate:  [79-120] 79 (02/04 0757) Resp:  [10-18] 14 (02/04 0757) BP: (94-184)/(74-113) 137/82 (02/04 0757) SpO2:  [88 %-95 %] 94 % (02/04 0757) Last BM Date : 07/21/22 General:    Older white male in NAD, much more comfortable appearing day Heart:  Regular rate and rhythm; no murmurs Lungs: Respirations even and unlabored, lungs CTA bilaterally Abdomen:  Soft, nondistended.  Obese, nontender, normal bowel sounds. Extremities:  Without edema. Neurologic:  Alert and oriented,  grossly normal neurologically. Psych:  Cooperative. Normal mood and affect.  Intake/Output from previous day: 02/03 0701 - 02/04 0700 In: 899.1 [I.V.:593.2; IV Piggyback:305.9] Out: 1580 [Urine:1580] Intake/Output this shift: No intake/output data recorded.  Lab Results: Recent Labs    07/22/22 0702 07/23/22 0334 07/24/22 0308  WBC 11.2* 11.7* 14.7*  HGB 14.2 16.0 14.7  HCT 40.7 46.1 42.5  PLT 263 254 269   BMET Recent Labs    07/22/22 0702 07/23/22 0334 07/24/22 0308  NA 137 136 133*  K 3.6 3.0* 3.7  CL 101 98 100  CO2 28 24 25   GLUCOSE 166* 234* 188*  BUN 22 15 16   CREATININE 0.97 0.85 0.74  CALCIUM 9.0 9.6 8.9   LFT Recent Labs    07/24/22 0308  PROT 5.6*  ALBUMIN 3.1*  AST 14*  ALT 28  ALKPHOS 48  BILITOT 1.2   PT/INR Recent Labs    07/21/22 1710   LABPROT 14.8  INR 1.2    Studies/Results: CT ABDOMEN PELVIS WO CONTRAST  Result Date: 07/23/2022 CLINICAL DATA:  Abdominal pain EXAM: CT ABDOMEN AND PELVIS WITHOUT CONTRAST TECHNIQUE: Multidetector CT imaging of the abdomen and pelvis was performed following the standard protocol without IV contrast. RADIATION DOSE REDUCTION: This exam was performed according to the departmental dose-optimization program which includes automated exposure control, adjustment of the mA and/or kV according to patient size and/or use of iterative reconstruction technique. COMPARISON:  05/21/2022 FINDINGS: Lower chest: Coronary and descending thoracic aortic atherosclerosis. Stable scarring in the right lower lobe. Circumferential distal esophageal wall thickening is nonspecific but distal esophagitis would be a common cause for this appearance. Mildly elevated right hemidiaphragm. Hepatobiliary: Diffuse hepatic steatosis. Gallbladder unremarkable. No biliary dilatation. Pancreas: Unremarkable Spleen: Unremarkable Adrenals/Urinary Tract: Both adrenal glands appear normal. Spill to nonobstructive right renal calculi, the largest in the right kidney upper pole measuring 0.7 cm in long axis. Three nonobstructive left renal calculi, the largest measuring 0.6 cm in long axis in the left kidney upper pole. No hydronephrosis or hydroureter. Moderately accentuated density of fluid in the urinary bladder, probably from previous contrast administration. Adrenal glands unremarkable. Stomach/Bowel: Unremarkable.  Normal appendix. Vascular/Lymphatic: Atherosclerosis is present, including aortoiliac atherosclerotic disease. 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 Reproductive: Unremarkable Other: No  supplemental non-categorized findings. Musculoskeletal: Acute fractures of the left seventh, eighth, and ninth ribs, with the seventh rib fracture displaced by  about 2 mm. These fractures were reported on prior CTA chest of 07/21/2022. Bridging spurring of the right sacroiliac joint. Thoracic and lumbar spondylosis with lower lumbar degenerative disc disease, resulting in bilateral foraminal impingement at L5-S1 and L4-5. IMPRESSION: 1. Bilateral nonobstructive nephrolithiasis. 2. Acute fractures of the left seventh, eighth, and ninth ribs, with the seventh rib fracture displaced by about 2 mm. These fractures were reported on prior CTA chest of 07/21/2022. 3. Circumferential distal esophageal wall thickening is nonspecific but distal esophagitis would be a common cause for this appearance. 4. Diffuse hepatic steatosis. 5. Lower lumbar degenerative disc disease causing bilateral foraminal impingement at L4-5 and L5-S1. 6. 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 7. Aortic atherosclerosis. Aortic Atherosclerosis (ICD10-I70.0). Electronically Signed   By: Gaylyn Rong M.D.   On: 07/23/2022 12:38   VAS US CAROTID  Result Date: 07/23/2022 Carotid Arterial Duplex Study Patient Name:  Alan Lin  Date of Exam:   07/22/2022 Medical Rec #: [id number]         Accession #:    [id number] Date of Birth: 1956-02-23          Patient Gender: M Patient Age:   19 years Exam Location:  [facility] Procedure:      VAS US CAROTID Referring Phys: Jonny Ruiz DOUTOVA --------------------------------------------------------------------------------  Indications:       Syncope. Risk Factors:      Hypertension, hyperlipidemia, Diabetes, no history of                    smoking. Limitations        Today's exam was limited due to Calcific shadowing, poor                    ultrasound tissue/interface and the body habitus of the                    patient. Comparison Study:  No previous exams Performing Technologist: Jody Hill RVT, RDMS  Examination Guidelines: A complete evaluation includes B-mode  imaging, spectral Doppler, color Doppler, and power Doppler as needed of all accessible portions of each vessel. Bilateral testing is considered an integral part of a complete examination. Limited examinations for reoccurring indications may be performed as noted.  Right Carotid Findings: +----------+-------+--------+--------+----------------------+------------------+           PSV    EDV cm/sStenosisPlaque Description    Comments                     cm/s                                                            +----------+-------+--------+--------+----------------------+------------------+ CCA Prox  59     9               hyperechoic and       intimal thickening                                  calcific                                 +----------+-------+--------+--------+----------------------+------------------+  CCA Distal51     6                                     intimal thickening +----------+-------+--------+--------+----------------------+------------------+ ICA Prox  39     7               calcific              Shadowing          +----------+-------+--------+--------+----------------------+------------------+ ICA Distal53     13              calcific                                 +----------+-------+--------+--------+----------------------+------------------+ ECA       123    9               calcific                                 +----------+-------+--------+--------+----------------------+------------------+ +----------+--------+-------+----------------+-------------------+           PSV cm/sEDV cmsDescribe        Arm Pressure (mmHG) +----------+--------+-------+----------------+-------------------+ [id number]             Multiphasic, WNL                    +----------+--------+-------+----------------+-------------------+ +---------+--------+--+--------+--+---------+ VertebralPSV cm/s54EDV cm/s11Antegrade  +---------+--------+--+--------+--+---------+  Left Carotid Findings: +----------+-------+--------+--------+-----------------------+-----------------+           PSV    EDV cm/sStenosisPlaque Description     Comments                    cm/s                                                            +----------+-------+--------+--------+-----------------------+-----------------+ CCA Prox  88     8               calcific               intimal                                                                   thickening        +----------+-------+--------+--------+-----------------------+-----------------+ CCA Distal85     11              heterogenous and       intimal                                            calcific               thickening        +----------+-------+--------+--------+-----------------------+-----------------+ ICA Prox  58     14  calcific               Shadowing         +----------+-------+--------+--------+-----------------------+-----------------+ ICA Distal50     16                                                       +----------+-------+--------+--------+-----------------------+-----------------+ ECA       134                                           shadowing         +----------+-------+--------+--------+-----------------------+-----------------+ +----------+--------+--------+----------------+-------------------+           PSV cm/sEDV cm/sDescribe        Arm Pressure (mmHG) +----------+--------+--------+----------------+-------------------+ [id number]             Multiphasic, WNL                    +----------+--------+--------+----------------+-------------------+ +---------+--------+--+--------+--------------+ VertebralPSV cm/s32EDV cm/sHigh resistant +---------+--------+--+--------+--------------+   Summary: Right Carotid: Velocities in the right ICA are consistent with a 1-39% stenosis. Left  Carotid: Velocities in the left ICA are consistent with a 1-39% stenosis.               The extracranial vessels were near-normal with only minimal wall               thickening or plaque. Vertebrals:  Right vertebral artery demonstrates antegrade flow. Left vertebral              artery demonstrates high resistant flow. Subclavians: Normal flow hemodynamics were seen in bilateral subclavian              arteries. *See table(s) above for measurements and observations.  Electronically signed by Orlie Pollen on 07/23/2022 at 10:34:35 AM.    Final    DG Abd 1 View  Result Date: 07/23/2022 CLINICAL DATA:  Vomiting with nausea EXAM: ABDOMEN - 1 VIEW COMPARISON:  None Available. FINDINGS: The stomach is distended. Otherwise limited visualization of bowel gas pattern due to body habitus. IMPRESSION: No dilated small bowel visible, but study limited by body habitus. Electronically Signed   By: Ulyses Jarred M.D.   On: 07/23/2022 03:24   EEG adult  Result Date: 07/22/2022 Lora Havens, MD     07/22/2022  5:01 PM Patient Name: Alan Lin MRN: [id number] Epilepsy Attending: Lora Havens Referring Physician/Provider: Thurnell Lose, MD Date: 07/22/2022 Duration: 25.06 mins Patient history: 67yo M with syncope. EEG to evaluate for seizure Level of alertness: Awake AEDs during EEG study: None Technical aspects: This EEG study was done with scalp electrodes positioned according to the 10-20 International system of electrode placement. Electrical activity was reviewed with band pass filter of 1-70Hz , sensitivity of 7 uV/mm, display speed of 79mm/sec with a 60Hz  notched filter applied as appropriate. EEG data were recorded continuously and digitally stored.  Video monitoring was available and reviewed as appropriate. Description: The posterior dominant rhythm consists of 9 Hz activity of moderate voltage (25-35 uV) seen predominantly in posterior head regions, symmetric and reactive to eye opening and eye closing.  Physiologic photic driving was seen during photic stimulation.  Hyperventilation was not performed.   IMPRESSION: This study is  within normal limits. No seizures or epileptiform discharges were seen throughout the recording. A normal interictal EEG does not exclude  the diagnosis of epilepsy. Priyanka [name]       Assessment / Plan:    #31 67 year old white male diabetic admitted 07/21/1998 3:24 falls at home the day of admission at least 1 with syncope, this is in the setting of recent neuro eval with diagnosis of combined radiculopathy and neuropathy as well as L5-S1 foraminal stenosis.  Fortunately he sustained 3 acute left rib fractures with this fall CT head negative  #2 electrolyte derangement replacing #3 acute onset of nausea and vomiting 07/22/2022 had also had some diarrhea. Plain films had shown a distended stomach but CT yesterday did not show any gastric distention and was otherwise unrevealing with the exception of possible distal esophagitis  Suspect patient has had some acute perhaps on chronic gastro paretic symptoms exacerbated by narcotics  He is much better today on IV Zofran scheduled, IV Reglan scheduled and IV PPI  For esophagitis he has no complaints of dysphagia or odynophagia and would treat conservatively at this time  #4 sleep apnea with CPAP use #5 hepatic steatosis #6 history of colon polyps prior colonoscopies through digestive health #7 nephrolithiasis  Plan; will advance diet to full liquids, can advance further as tolerated Continue Zofran for now around-the-clock if he continues to do well can change this back to as needed Continue Reglan around-the-clock for now, again can change to oral once he is tolerating solid food Try to minimize narcotics Continue IV PPI twice daily for now again when tolerating solid food can convert to oral  No plans for EGD at this time GI will sign off, available for problems or if he fails to continue to  improve       Principal Problem:   Syncope Active Problems:   Diabetes mellitus with no complication (HCC)   Hypomagnesemia   Hypokalemia   Hyperlipidemia   Essential hypertension   Hypophosphatemia   OSA (obstructive sleep apnea)   Falls   Rib fracture     LOS: 2 days   Nikiesha Milford PA-C 07/24/2022, 11:34 AM

## 2022-07-25 ENCOUNTER — Inpatient Hospital Stay (HOSPITAL_COMMUNITY): Payer: Medicare Other

## 2022-07-25 ENCOUNTER — Telehealth: Payer: Self-pay

## 2022-07-25 DIAGNOSIS — K209 Esophagitis, unspecified without bleeding: Secondary | ICD-10-CM | POA: Diagnosis not present

## 2022-07-25 LAB — CREATININE, URINE, RANDOM: Creatinine, Urine: 81 mg/dL

## 2022-07-25 LAB — CBC WITH DIFFERENTIAL/PLATELET
Abs Immature Granulocytes: 0.14 10*3/uL — ABNORMAL HIGH (ref 0.00–0.07)
Basophils Absolute: 0.1 10*3/uL (ref 0.0–0.1)
Basophils Relative: 0 %
Eosinophils Absolute: 0.1 10*3/uL (ref 0.0–0.5)
Eosinophils Relative: 1 %
HCT: 44.6 % (ref 39.0–52.0)
Hemoglobin: 14.8 g/dL (ref 13.0–17.0)
Immature Granulocytes: 1 %
Lymphocytes Relative: 16 %
Lymphs Abs: 2.2 10*3/uL (ref 0.7–4.0)
MCH: 28.5 pg (ref 26.0–34.0)
MCHC: 33.2 g/dL (ref 30.0–36.0)
MCV: 85.9 fL (ref 80.0–100.0)
Monocytes Absolute: 1.1 10*3/uL — ABNORMAL HIGH (ref 0.1–1.0)
Monocytes Relative: 8 %
Neutro Abs: 10.3 10*3/uL — ABNORMAL HIGH (ref 1.7–7.7)
Neutrophils Relative %: 74 %
Platelets: 273 10*3/uL (ref 150–400)
RBC: 5.19 MIL/uL (ref 4.22–5.81)
RDW: 13 % (ref 11.5–15.5)
WBC: 13.9 10*3/uL — ABNORMAL HIGH (ref 4.0–10.5)
nRBC: 0 % (ref 0.0–0.2)

## 2022-07-25 LAB — GLUCOSE, CAPILLARY
Glucose-Capillary: 180 mg/dL — ABNORMAL HIGH (ref 70–99)
Glucose-Capillary: 180 mg/dL — ABNORMAL HIGH (ref 70–99)
Glucose-Capillary: 199 mg/dL — ABNORMAL HIGH (ref 70–99)
Glucose-Capillary: 264 mg/dL — ABNORMAL HIGH (ref 70–99)
Glucose-Capillary: 221 mg/dL — ABNORMAL HIGH (ref 70–99)
Glucose-Capillary: 198 mg/dL — ABNORMAL HIGH (ref 70–99)

## 2022-07-25 LAB — PROTIME-INR
INR: 1.1 (ref 0.8–1.2)
Prothrombin Time: 13.7 seconds (ref 11.4–15.2)

## 2022-07-25 LAB — COMPREHENSIVE METABOLIC PANEL
ALT: 26 U/L (ref 0–44)
AST: 17 U/L (ref 15–41)
Albumin: 3 g/dL — ABNORMAL LOW (ref 3.5–5.0)
Alkaline Phosphatase: 48 U/L (ref 38–126)
Anion gap: 9 (ref 5–15)
BUN: 13 mg/dL (ref 8–23)
CO2: 26 mmol/L (ref 22–32)
Calcium: 9 mg/dL (ref 8.9–10.3)
Chloride: 96 mmol/L — ABNORMAL LOW (ref 98–111)
Creatinine, Ser: 0.79 mg/dL (ref 0.61–1.24)
GFR, Estimated: >60 mL/min (ref >60)
Glucose, Bld: 162 mg/dL — ABNORMAL HIGH (ref 70–99)
Potassium: 3.5 mmol/L (ref 3.5–5.1)
Sodium: 131 mmol/L — ABNORMAL LOW (ref 135–145)
Total Bilirubin: 1 mg/dL (ref 0.3–1.2)
Total Protein: 5.5 g/dL — ABNORMAL LOW (ref 6.5–8.1)

## 2022-07-25 LAB — OSMOLALITY: Osmolality: 286 mOsm/kg (ref 275–295)

## 2022-07-25 LAB — URIC ACID: Uric Acid, Serum: 8.1 mg/dL (ref 3.7–8.6)

## 2022-07-25 LAB — BRAIN NATRIURETIC PEPTIDE: B Natriuretic Peptide: 182 pg/mL — ABNORMAL HIGH (ref 0.0–100.0)

## 2022-07-25 LAB — SODIUM, URINE, RANDOM: Sodium, Ur: 98 mmol/L

## 2022-07-25 LAB — OSMOLALITY, URINE: Osmolality, Ur: 702 mOsm/kg (ref 300–900)

## 2022-07-25 LAB — HEPARIN LEVEL (UNFRACTIONATED): Heparin Unfractionated: 0.16 IU/mL — ABNORMAL LOW (ref 0.30–0.70)

## 2022-07-25 LAB — MAGNESIUM: Magnesium: 1.8 mg/dL (ref 1.7–2.4)

## 2022-07-25 MED ORDER — AMIODARONE HCL IN DEXTROSE 360-4.14 MG/200ML-% IV SOLN
30.0000 mg/h | INTRAVENOUS | Status: AC
  Administered 2022-07-25: 30 mg/h via INTRAVENOUS
  Filled 2022-07-25 (×2): qty 200

## 2022-07-25 MED ORDER — ONDANSETRON HCL 4 MG/2ML IJ SOLN: 4.0000 mg | Freq: Four times a day (QID) | INTRAMUSCULAR | Status: DC | PRN

## 2022-07-25 MED ORDER — HEPARIN BOLUS VIA INFUSION
3000.0000 [IU] | Freq: Once | INTRAVENOUS | Status: AC
  Administered 2022-07-25: 3000 [IU] via INTRAVENOUS
  Filled 2022-07-25: qty 3000

## 2022-07-25 MED ORDER — DILTIAZEM HCL 25 MG/5ML IV SOLN
10.0000 mg | Freq: Four times a day (QID) | INTRAVENOUS | Status: DC | PRN
  Administered 2022-07-25: 10 mg via INTRAVENOUS
  Filled 2022-07-25 (×2): qty 5

## 2022-07-25 MED ORDER — SODIUM CHLORIDE 0.9 % IV SOLN: INTRAVENOUS | Status: DC

## 2022-07-25 MED ORDER — FUROSEMIDE 40 MG PO TABS
40.0000 mg | ORAL_TABLET | Freq: Once | ORAL | Status: DC
  Filled 2022-07-25: qty 1

## 2022-07-25 MED ORDER — METOPROLOL TARTRATE 5 MG/5ML IV SOLN: 10.0000 mg | Freq: Three times a day (TID) | INTRAVENOUS | Status: DC | PRN

## 2022-07-25 MED ORDER — HEPARIN BOLUS VIA INFUSION
4000.0000 [IU] | Freq: Once | INTRAVENOUS | Status: AC
  Administered 2022-07-25: 4000 [IU] via INTRAVENOUS
  Filled 2022-07-25: qty 4000

## 2022-07-25 MED ORDER — AMIODARONE HCL IN DEXTROSE 360-4.14 MG/200ML-% IV SOLN
60.0000 mg/h | INTRAVENOUS | Status: AC
  Administered 2022-07-25 (×2): 60 mg/h via INTRAVENOUS
  Filled 2022-07-25: qty 200

## 2022-07-25 MED ORDER — METOPROLOL TARTRATE 5 MG/5ML IV SOLN: 5.0000 mg | Freq: Three times a day (TID) | INTRAVENOUS | Status: DC | PRN

## 2022-07-25 MED ORDER — METOPROLOL TARTRATE 5 MG/5ML IV SOLN
INTRAVENOUS | Status: AC
  Administered 2022-07-25: 10 mg via INTRAVENOUS
  Filled 2022-07-25: qty 10

## 2022-07-25 MED ORDER — METOCLOPRAMIDE HCL 5 MG/ML IJ SOLN
5.0000 mg | Freq: Three times a day (TID) | INTRAMUSCULAR | Status: DC
  Administered 2022-07-25 – 2022-07-26 (×5): 5 mg via INTRAVENOUS
  Filled 2022-07-25 (×5): qty 2

## 2022-07-25 MED ORDER — HEPARIN (PORCINE) 25000 UT/250ML-% IV SOLN
1700.0000 [IU]/h | INTRAVENOUS | Status: DC
  Administered 2022-07-25: 1400 [IU]/h via INTRAVENOUS
  Administered 2022-07-25 – 2022-07-26 (×2): 1700 [IU]/h via INTRAVENOUS
  Filled 2022-07-25 (×3): qty 250

## 2022-07-25 MED ORDER — METOPROLOL SUCCINATE ER 50 MG PO TB24
50.0000 mg | ORAL_TABLET | Freq: Two times a day (BID) | ORAL | Status: DC
  Administered 2022-07-25 – 2022-07-28 (×7): 50 mg via ORAL
  Filled 2022-07-25 (×7): qty 1

## 2022-07-25 MED ORDER — LACTATED RINGERS IV BOLUS
500.0000 mL | Freq: Once | INTRAVENOUS | Status: AC
  Administered 2022-07-25: 500 mL via INTRAVENOUS

## 2022-07-25 MED ORDER — HYDROCODONE-ACETAMINOPHEN 5-325 MG PO TABS
1.0000 | ORAL_TABLET | Freq: Four times a day (QID) | ORAL | Status: DC | PRN
  Administered 2022-07-26 – 2022-07-28 (×4): 1 via ORAL
  Filled 2022-07-25 (×5): qty 1

## 2022-07-25 MED ORDER — AMIODARONE LOAD VIA INFUSION
150.0000 mg | Freq: Once | INTRAVENOUS | Status: AC
  Administered 2022-07-25: 150 mg via INTRAVENOUS
  Filled 2022-07-25: qty 83.34

## 2022-07-25 MED ORDER — MAGNESIUM SULFATE 4 GM/100ML IV SOLN
4.0000 g | Freq: Once | INTRAVENOUS | Status: AC
  Administered 2022-07-25: 4 g via INTRAVENOUS
  Filled 2022-07-25: qty 100

## 2022-07-25 NOTE — Evaluation (Signed)
Clinical/Bedside Swallow Evaluation Patient Details  Name: Alan Lin MRN: [id number] Date of Birth: 1955/09/14  Today's Date: 07/25/2022 Time: SLP Start Time (ACUTE ONLY): 1438 SLP Stop Time (ACUTE ONLY): 1448 SLP Time Calculation (min) (ACUTE ONLY): 10 min  Past Medical History:  Past Medical History:  Diagnosis Date   Arthritis    Diabetes mellitus without complication ([place])    Hypertension    Sleep apnea    Past Surgical History: History reviewed. No pertinent surgical history. HPI:  67 y/o M admitted on 2/1 with falls and loss of consciousness. Pt had 3 episodes of syncope, imaging reveals fractures of L 6, 7, 8 ribs. PMHx: . Prior C4-5 ACDF, DM, HTN, falls, unsteady gait followed by neurology.    Assessment / Plan / Recommendation  Clinical Impression  Pt alert, cognitively intact and denies history of dysphagia currently or after prior ACDF. He stated MD may have ordered because sometimes "I have to hock and cough mucous up" or because "I vomitted on the meatloaf the other day." His oral-motor exam was unremarkable and he declined to cough given rib pain. His coordination of swallow and respiration was timely and there were no coughs indicating concern for aspiration across thin or regular texture. Mastication of solid was adequate and complete. Pt is on a soft diet and therapist will advance to regular with pt in agreement. Continue thin and pills with thin. No further ST needed. SLP Visit Diagnosis: Dysphagia, unspecified (R13.10)    Aspiration Risk  No limitations    Diet Recommendation Regular;Thin liquid   Liquid Administration via: Cup;Straw Medication Administration: Whole meds with liquid Supervision: Patient able to self feed Postural Changes: Seated upright at 90 degrees    Other  Recommendations Oral Care Recommendations: Oral care BID    Recommendations for follow up therapy are one component of a multi-disciplinary discharge planning process, led by the  attending physician.  Recommendations may be updated based on patient status, additional functional criteria and insurance authorization.  Follow up Recommendations No SLP follow up      Assistance Recommended at Discharge    Functional Status Assessment Patient has not had a recent decline in their functional status  Frequency and Duration            Prognosis        Swallow Study   General Date of Onset: 07/21/22 HPI: 67 y/o M admitted on 2/1 with falls and loss of consciousness. Pt had 3 episodes of syncope, imaging reveals fractures of L 6, 7, 8 ribs. PMHx: . Prior C4-5 ACDF, DM, HTN, falls, unsteady gait followed by neurology. Type of Study: Bedside Swallow Evaluation Previous Swallow Assessment:  (none) Diet Prior to this Study: Other (Comment);Thin liquids (soft) Temperature Spikes Noted: No Respiratory Status: Room air History of Recent Intubation: No Behavior/Cognition: Alert;Cooperative;Pleasant mood Oral Cavity Assessment: Within Functional Limits Oral Care Completed by SLP: No Oral Cavity - Dentition: Adequate natural dentition Vision: Functional for self-feeding Self-Feeding Abilities: Able to feed self Patient Positioning: Upright in bed Baseline Vocal Quality: Normal Volitional Cough:  (declined due to very painful rib fractures) Volitional Swallow: Able to elicit    Oral/Motor/Sensory Function Overall Oral Motor/Sensory Function: Within functional limits   Ice Chips Ice chips: Not tested   Thin Liquid Thin Liquid: Within functional limits    Nectar Thick Nectar Thick Liquid: Not tested   Honey Thick Honey Thick Liquid: Not tested   Puree Puree: Not tested   Solid     Solid:  Within functional limits      Houston Siren 07/25/2022,3:04 PM

## 2022-07-25 NOTE — Progress Notes (Signed)
Occupational Therapy Treatment Patient Details Name: Alan Lin MRN: [id number] DOB: 1956/03/09 Today's Date: 07/25/2022   History of present illness 67 y/o M admitted on 2/1 with falls and loss of consciousness. Pt had 3 episodes of syncope, imaging reveals fractures of L 6, 7, 8 ribs. PMHx: DM, HTN, falls, unsteady gait followed by neurology.   OT comments  Patient with incremental progress toward patient focused goals.  Pain continues to be the limiting factor.  Unfortunately he has not progressed well enough to transition directly home.  Patient lives alone, and will not have the needed 24 hour assist to be successful.  OT will continue to address deficits listed, but SNF level rehab is now recommended for post acute rehab prior to returning home.     Recommendations for follow up therapy are one component of a multi-disciplinary discharge planning process, led by the attending physician.  Recommendations may be updated based on patient status, additional functional criteria and insurance authorization.    Follow Up Recommendations  Skilled nursing-short term rehab (<3 hours/day)     Assistance Recommended at Discharge Frequent or constant Supervision/Assistance  Patient can return home with the following  Assist for transportation;Assistance with cooking/housework;A lot of help with walking and/or transfers;A lot of help with bathing/dressing/bathroom   Equipment Recommendations  BSC/3in1    Recommendations for Other Services      Precautions / Restrictions Precautions Precautions: Fall Precaution Comments: L rib fx's Restrictions Weight Bearing Restrictions: No       Mobility Bed Mobility Overal bed mobility: Needs Assistance Bed Mobility: Supine to Sit     Supine to sit: Mod assist, HOB elevated          Transfers Overall transfer level: Needs assistance Equipment used: Rolling walker (2 wheels) Transfers: Sit to/from Stand Sit to Stand: Min assist                  Balance Overall balance assessment: Needs assistance Sitting-balance support: Feet supported, Single extremity supported Sitting balance-Leahy Scale: Fair     Standing balance support: Reliant on assistive device for balance, During functional activity Standing balance-Leahy Scale: Poor                             ADL either performed or assessed with clinical judgement   ADL   Eating/Feeding: Independent;Sitting   Grooming: Wash/dry hands;Wash/dry face;Set up;Sitting           Upper Body Dressing : Minimal assistance;Sitting;Moderate assistance   Lower Body Dressing: Moderate assistance;Sit to/from stand   Toilet Transfer: Minimal assistance;Moderate assistance;Stand-pivot;BSC/3in1                  Extremity/Trunk Assessment Upper Extremity Assessment Upper Extremity Assessment: Overall WFL for tasks assessed       Cervical / Trunk Assessment Cervical / Trunk Assessment: Normal    Vision       Perception     Praxis      Cognition Arousal/Alertness: Awake/alert Behavior During Therapy: WFL for tasks assessed/performed Overall Cognitive Status: Within Functional Limits for tasks assessed                                          Exercises      Shoulder Instructions       General Comments  VSS on RA    Pertinent  Vitals/ Pain       Pain Assessment Pain Assessment: Faces Faces Pain Scale: Hurts even more Pain Location: ribs Pain Descriptors / Indicators: Sharp, Grimacing, Guarding Pain Intervention(s): Limited activity within patient's tolerance                                                          Frequency  Min 2X/week        Progress Toward Goals  OT Goals(current goals can now be found in the care plan section)  Progress towards OT goals: Progressing toward goals  Acute Rehab OT Goals OT Goal Formulation: With patient Time For Goal Achievement:  08/05/22 Potential to Achieve Goals: [place] Discharge plan needs to be updated    Co-evaluation                 AM-PAC OT "6 Clicks" Daily Activity     Outcome Measure   Help from another person eating meals?: None Help from another person taking care of personal grooming?: A Little Help from another person toileting, which includes using toliet, bedpan, or urinal?: A Lot Help from another person bathing (including washing, rinsing, drying)?: A Lot Help from another person to put on and taking off regular upper body clothing?: A Lot Help from another person to put on and taking off regular lower body clothing?: A Lot 6 Click Score: 15    End of Session Equipment Utilized During Treatment: Rolling walker (2 wheels)  OT Visit Diagnosis: Unsteadiness on feet (R26.81);Muscle weakness (generalized) (M62.81);Pain   Activity Tolerance Patient limited by pain   Patient Left in bed;with call bell/phone within reach   Nurse Communication Mobility status        Time: 2951-8841 OT Time Calculation (min): 17 min  Charges: OT General Charges $OT Visit: 1 Visit OT Treatments $Self Care/Home Management : 8-22 mins  07/25/2022  RP, OTR/L  Acute Rehabilitation Services  Office:  [phone]   Metta Clines 07/25/2022, 1:50 PM

## 2022-07-25 NOTE — TOC Progression Note (Signed)
Transition of Care [facility]) - Initial/Assessment Note    Patient Details  Name: Alan Lin MRN: [id number] Date of Birth: 1956-01-14  Transition of Care [facility]) CM/SW Contact:    Milinda Antis, LCSWA Phone Number: 07/25/2022, 2:00 PM  Clinical Narrative:                 LCSW contacted the patient's brother to present bed offers.  There was no answer.  LCSW left a VM requesting a returned call.  TOC following.  Expected Discharge Plan: Skilled Nursing Facility Barriers to Discharge: Continued Medical Work up   Patient Goals and CMS Choice     Choice offered to / list presented to : Patient      Expected Discharge Plan and Services In-house Referral: Clinical Social Work     Living arrangements for the past 2 months: Single Family Home                                      Prior Living Arrangements/Services Living arrangements for the past 2 months: Single Family Home Lives with:: Self   Do you feel safe going back to the place where you live?: Yes               Activities of Daily Living Home Assistive Devices/Equipment: Cane (specify quad or straight) ADL Screening (condition at time of admission) Patient's cognitive ability adequate to safely complete daily activities?: Yes Is the patient deaf or have difficulty hearing?: No Does the patient have difficulty seeing, even when wearing glasses/contacts?: No Does the patient have difficulty concentrating, remembering, or making decisions?: No Patient able to express need for assistance with ADLs?: Yes Does the patient have difficulty dressing or bathing?: No Independently performs ADLs?: Yes (appropriate for developmental age) Does the patient have difficulty walking or climbing stairs?: No Weakness of Legs: None Weakness of Arms/Hands: None  Permission Sought/Granted            Permission granted to share info w Relationship: Star,Scott (Brother) [phone] (Home Phone)  Permission granted  to share info w Contact Information: Voltaire (Brother) [phone] (Home Phone)  Emotional Assessment Appearance:: Appears stated age Attitude/Demeanor/Rapport: Engaged Affect (typically observed): Adaptable Orientation: : Oriented to Self, Oriented to Place, Oriented to  Time, Oriented to Situation Alcohol / Substance Use: Not Applicable Psych Involvement: No (comment)  Admission diagnosis:  Syncope and collapse [R55] Syncope [R55] Contusion of forehead, initial encounter [S00.83XA] Closed fracture of one rib of left side, initial encounter [S22.32XA] Patient Active Problem List   Diagnosis Date Noted   Acute esophagitis 07/24/2022   Syncope 07/21/2022   Hypomagnesemia 07/21/2022   Hypokalemia 07/21/2022   Hyperlipidemia 07/21/2022   Essential hypertension 07/21/2022   Hypophosphatemia 07/21/2022   OSA (obstructive sleep apnea) 07/21/2022   Falls 07/21/2022   Rib fracture 07/21/2022   Degeneration of lumbar intervertebral disc 11/01/2019   DDD (degenerative disc disease), cervical 10/28/2019   Cervical radiculitis 09/20/2019   Displacement 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 09/20/2019   Anxiety 07/16/2019   Right hip pain 07/30/2018   Primary osteoarthritis of both knees 07/30/2018   Pain in buttock 07/30/2018   Abdominal weakness 07/24/2017   Pain in joint, shoulder region 11/29/2016   Diabetes mellitus with no complication ([place]) 20/25/4270   PCP:  Esmond Harps, MD Pharmacy:   [id number] - [place], [address]  [place] [address] Phone: [phone] Fax: [phone]     Social Determinants of Health (SDOH) Social History: [place]: No Food Insecurity (07/21/2022)  Housing: Low Risk  (07/21/2022)  Transportation Needs: No Transportation Needs (07/21/2022)  Utilities: Not At Risk (07/21/2022)  Tobacco Use: Low Risk  (07/21/2022)   SDOH Interventions:     Readmission Risk  Interventions     No data to display

## 2022-07-25 NOTE — Progress Notes (Signed)
ANTICOAGULATION CONSULT NOTE  Pharmacy Consult for Heparin Indication: atrial fibrillation  Allergies  Allergen Reactions   Metformin Diarrhea and Nausea And Vomiting   Oxycodone Other (See Comments)    Constipation   Oxycodone-Acetaminophen     Constipation   Lisinopril Cough   Morphine Itching    Other reaction(s): Other (See Comments)  Constipation   Morphine And Related Itching    Patient Measurements: Height: 5\' 10"  (177.8 cm) Weight: 112.8 kg (248 lb 10.9 oz) IBW/kg (Calculated) : 73 Heparin Dosing Weight: 100  Vital Signs: Temp: 97.5 F (36.4 C) (02/05 0842) Temp Source: Oral (02/05 0842) BP: 126/84 (02/05 1700) Pulse Rate: 85 (02/05 1151)  Labs: Recent Labs    07/23/22 0334 07/24/22 0308 07/25/22 0214 07/25/22 1810  HGB 16.0 14.7 14.8  --   HCT 46.1 42.5 44.6  --   PLT 254 269 273  --   HEPARINUNFRC  --   --   --  0.16*  CREATININE 0.85 0.74 0.79  --      Estimated Creatinine Clearance: 114.2 mL/min (by C-G formula based on SCr of 0.79 mg/dL).  Assessment: 67 year old male to begin heparin for Afib. No AC PTA.   Heparin level subtherapeutic (0.16) on infusion at 1400 units/hr. No issues with line or bleeding reported per RN.  Goal of Therapy:  Heparin level 0.3-0.7 units/ml Monitor platelets by anticoagulation protocol: Yes   Plan:  Rebolus heparin 3000 units IV Increase heparin infusion to 1700 units / hr Heparin level in 6 hours  Sherlon Handing, PharmD, BCPS Please see amion for complete clinical pharmacist phone list 07/25/2022,7:51 PM

## 2022-07-25 NOTE — Consult Note (Signed)
CARDIOLOGY CONSULT NOTE  Patient ID: Alan Lin MRN: [id number] DOB/AGE: [birth year] 67 y.o.  Admit date: 07/21/2022 Referring Physician  Dr Candiss Norse Primary Physician:  Esmond Harps, MD Reason for Consultation  Afib  Patient ID: Alan Lin, male    DOB: 1955/10/05, 67 y.o.   MRN: [id number]  Chief Complaint  Patient presents with   Fall   Loss of Consciousness   HPI:    Alan Lin  is a 67 y.o. male with past medical history significant for NIDDM2, HTN, and recent falls. Patient presented to the ER after an episode of diarrhea after which he took an Imodium, so when he went to the bathroom around 4 AM in the morning, he experienced two syncopal episodes. This morning, he describes these episodes as fall and not LOC. Patient admits to feeling light-headed but he denies chest pain, shortness of breath, palpitations. Of note, patient found to have rib fractures on his left rib cage from the fall. His pain is well controlled with fentanyl patch. Patient has seen cardio 5-6 years ago at [facility] but has not seen anyone since. He has never had any sort of cardiac intervention in the past. He has heard of Afib but denies ever having it himself. He is agreeable to TEE with [facility] tomorrow and would like his brother looped in as he is his POA.   Past Medical History:  Diagnosis Date   Arthritis    Diabetes mellitus without complication ([place])    Hypertension    Sleep apnea    History reviewed. No pertinent surgical history. Social History   Tobacco Use   Smoking status: Never   Smokeless tobacco: Never  Substance Use Topics   Alcohol use: Never    Family History  Problem Relation Age of Onset   Dementia Mother    Alzheimer's disease Mother    Kidney Stones Father     Marital Status: Single  ROS  Review of Systems  Cardiovascular:  Positive for irregular heartbeat and palpitations.  Musculoskeletal:  Positive for joint pain.   Objective      07/25/2022    8:59 AM 07/25/2022     8:42 AM 07/25/2022    4:49 AM  Vitals with BMI  Systolic  016 010  Diastolic  86 73  Pulse 932 99     Blood pressure 101/86, pulse (!) 150, temperature (!) 97.5 F (36.4 C), temperature source Oral, resp. rate 15, height 5\' 10"  (1.778 m), weight 112.8 kg, SpO2 90 %.    Physical Exam Vitals reviewed.  HENT:     Head: Normocephalic and atraumatic.  Cardiovascular:     Rate and Rhythm: Tachycardia present. Rhythm irregular.     Heart sounds: Normal heart sounds. No murmur heard. Pulmonary:     Effort: Pulmonary effort is normal.  Abdominal:     General: Bowel sounds are normal.  Musculoskeletal:     Right lower leg: No edema.     Left lower leg: No edema.  Skin:    General: Skin is warm and dry.  Neurological:     Mental Status: He is alert.    Laboratory examination:   Recent Labs    07/23/22 0334 07/24/22 0308 07/25/22 0214  NA 136 133* 131*  K 3.0* 3.7 3.5  CL 98 100 96*  CO2 24 25 26   GLUCOSE 234* 188* 162*  BUN 15 16 13   CREATININE 0.85 0.74 0.79  CALCIUM 9.6 8.9 9.0  GFRNONAA >60 >60 >60  estimated creatinine clearance is 114.2 mL/min (by C-G formula based on SCr of 0.79 mg/dL).     Latest Ref Rng & Units 07/25/2022    2:14 AM 07/24/2022    3:08 AM 07/23/2022    3:34 AM  CMP  Glucose 70 - 99 mg/dL 162  188  234   BUN 8 - 23 mg/dL 13  16  15    Creatinine 0.61 - 1.24 mg/dL 0.79  0.74  0.85   Sodium 135 - 145 mmol/L 131  133  136   Potassium 3.5 - 5.1 mmol/L 3.5  3.7  3.0   Chloride 98 - 111 mmol/L 96  100  98   CO2 22 - 32 mmol/L 26  25  24    Calcium 8.9 - 10.3 mg/dL 9.0  8.9  9.6   Total Protein 6.5 - 8.1 g/dL 5.5  5.6  6.1   Total Bilirubin 0.3 - 1.2 mg/dL 1.0  1.2  1.1   Alkaline Phos 38 - 126 U/L 48  48  50   AST 15 - 41 U/L 17  14  30    ALT 0 - 44 U/L 26  28  37       Latest Ref Rng & Units 07/25/2022    2:14 AM 07/24/2022    3:08 AM 07/23/2022    3:34 AM  CBC  WBC 4.0 - 10.5 K/uL 13.9  14.7  11.7   Hemoglobin 13.0 - 17.0 g/dL 14.8  14.7  16.0    Hematocrit 39.0 - 52.0 % 44.6  42.5  46.1   Platelets 150 - 400 K/uL 273  269  254    Lipid Panel No results for input(s): "CHOL", "TRIG", "LDLCALC", "VLDL", "HDL", "CHOLHDL", "LDLDIRECT" in the last 8760 hours.  HEMOGLOBIN A1C Lab Results  Component Value Date   HGBA1C 8.0 (H) 07/21/2022   MPG 182.9 07/21/2022   TSH Recent Labs    07/21/22 1907  TSH 0.453   BNP (last 3 results) Recent Labs    07/23/22 0334 07/24/22 0308 07/25/22 0214  BNP 75.0 86.8 182.0*   Cardiac Panel (last 3 results) No results for input(s): "CKTOTAL", "CKMB", "TROPONINIHS", "RELINDX" in the last 72 hours.   Medications and allergies   Allergies  Allergen Reactions   Metformin Diarrhea and Nausea And Vomiting   Oxycodone Other (See Comments)    Constipation   Oxycodone-Acetaminophen     Constipation   Lisinopril Cough   Morphine Itching    Other reaction(s): Other (See Comments)  Constipation   Morphine And Related Itching     Current Meds  Medication Sig   acetaminophen (TYLENOL) 500 MG tablet Take 500 mg by mouth every 6 (six) hours as needed. Takes two 500mg  at bedtime   amLODipine (NORVASC) 5 MG tablet Take 5 mg by mouth daily.   chlorthalidone (HYGROTON) 25 MG tablet Take 25 mg by mouth daily.   cyclobenzaprine (FLEXERIL) 10 MG tablet Take 10 mg by mouth daily as needed for muscle spasms.   diclofenac Sodium (VOLTAREN) 1 % GEL Apply 2 g topically 4 (four) times daily as needed (pain).   DULoxetine (CYMBALTA) 20 MG capsule Take 60 mg by mouth daily.   gabapentin (NEURONTIN) 300 MG capsule Take 300-1,500 mg by mouth daily. Take 300mg  (1 capsule) by mouth in the mornings and 1500mg  (5 capsules) in the evening.   losartan (COZAAR) 50 MG tablet Take 50 mg by mouth daily.   metoprolol succinate (TOPROL-XL) 25 MG 24 hr tablet Take 25  mg by mouth daily.   Multiple Vitamins-Minerals (MULTIVITAMIN WITH MINERALS) tablet Take 1 tablet by mouth daily.   simvastatin (ZOCOR) 20 MG tablet Take 20  mg by mouth daily.    Scheduled Meds:  DULoxetine  40 mg Oral Daily   fentaNYL  1 patch Transdermal Q72H   gabapentin  300 mg Oral Daily   insulin aspart  0-9 Units Subcutaneous Q4H   metoCLOPramide (REGLAN) injection  5 mg Intravenous Q8H   metoprolol tartrate  50 mg Oral BID   pantoprazole (PROTONIX) IV  40 mg Intravenous Q12H   senna  2 tablet Oral Daily   simvastatin  20 mg Oral Daily   Continuous Infusions: PRN Meds:.acetaminophen **OR** acetaminophen, diltiazem, HYDROcodone-acetaminophen, HYDROmorphone (DILAUDID) injection, metoprolol tartrate, ondansetron (ZOFRAN) IV, traMADol   I/O last 3 completed shifts: In: 480 [P.O.:480] Out: 1330 [Urine:1330] No intake/output data recorded.  Net IO Since Admission: -3,807.88 mL [07/25/22 0942]   Radiology:   Imaging results have been reviewed and DG Chest Port 1 View  Result Date: 07/25/2022 CLINICAL DATA:  Shortness of breath, left rib fractures. EXAM: PORTABLE CHEST 1 VIEW COMPARISON:  07/21/2022 and CT chest 07/21/2022. FINDINGS: Trachea is midline. Heart size stable. Thoracic aorta is calcified. Lungs are low in volume minimal streaky volume loss in the right lung base. Right hemidiaphragm is elevated. No airspace consolidation or pleural fluid. Left sixth, seventh and eighth rib fractures are noted. IMPRESSION: 1. No acute pulmonary parenchymal findings. 2. Acute left sixth, seventh and eighth rib fractures. Electronically Signed   By: Lorin Picket M.D.   On: 07/25/2022 09:35    Cardiac Studies:     EKG: 07/25/2022: Atrial fibrillation with rapid ventricular response. ST & T wave abnormality, consider inferolateral ischemia. Abnormal ECG. When compared with ECG of 25-Jul-2022 09:04, sinus tachcyardia is changed to Afib with RVR  Assessment & Recommendations:   New onset Afib RVR, currently CVR CHA2DS2VASc = at least 3 Echocardiogram reviewed. Avoid Qtc prolonging drugs. Mag 4 grams IV ordered.  Maintain K>4 and  Mag>2. Lopressor changed to Toprol XL 50mg  BID. Heparin gtt started. Will start amiodarone gtt.  Transition to Eliquis before d/c. Will obtain stress test in outpatient setting given rib fractures. Recommend rehab prior to d/c home.  Scheduled TEE with [facility] tomorrow 07/26/2022 at noon.     Floydene Flock, MD, [facility] 07/25/2022, 9:42 AM Office: [phone]

## 2022-07-25 NOTE — Progress Notes (Addendum)
PROGRESS NOTE                                                                                                                                                                                                             Patient Demographics:    Alan Lin, is a 67 y.o. male, DOB - [birth year], DI:[id number]  Outpatient Primary MD for the patient is Esmond Harps, MD    LOS - 3  Admit date - 07/21/2022    Chief Complaint  Patient presents with   Fall   Loss of Consciousness       Brief Narrative (HPI from H&P)   67 y.o. male with medical history significant of Dm2, HTN, falls.  Resented to the ER after an episode of diarrhea last night after which she took 1 Imodium, thereafter he went to the bathroom 4 AM in the morning where he syncopized once and twice again thereafter, he was slightly lightheaded during these episodes but did not have any chest pain or shortness of breath.  He recently had a C-spine surgery for which she wears a c-collar, during his falls he did hurt his left-sided rib cage.  Brought to the ER where head CT and C-spine CT were stable, CTA chest was stable, he did have fractures on his left rib cage, he was admitted for further workup for syncope.    Subjective:   Patient in bed, appears comfortable, denies any headache, no fever, no chest pain or pressure, no shortness of breath , no abdominal pain. No new focal weakness.   Assessment  & Plan :   Multiple falls after syncope and lightheadedness in a patient with previous history of falls.  Unclear reason but could be due to orthostatic hypotension caused by an episode of diarrhea the night of admission.  CT head unremarkable, no focal deficits, EEG, echocardiogram and carotid venous duplex are stable, stable on telemetry, stable orthostatics, seen by PT OT qualifies for SNF, hydrated IV fluids.  Much improved.  Have also ordered 30-day event monitor upon  discharge.  Peripheral neuropathy.  Noted on EMG studies done outpatient recently.  Outpatient follow-up with his neurologist, PT OT here, may require placement.    Gastroenteritis.  Questionable food poisoning at night on 07/22/2022, CT abdomen pelvis suggests possible esophagitis, placed on twice daily PPI and supportive care  with antinausea medicines and Reglan for a few days, GI on board.  No EGD for now per GI.  Chronic neck pain and C-spine surgery.  Continue c-collar follow-up with orthopedic surgeon postdischarge.  Dehydration with hypomagnesemia, hypophosphatemia and hypokalemia.  IV fluids, replace electrolytes.  Hyponatremia.  Hydrate and monitor.  Left rib cage fractures.  Supportive care.  Pain medications adjusted for better control on 07/23/2022.  OSA.  CPAP at night.  Hypertension.  Pressure stable placed on beta-blocker.  Sinus tachycardia with PACs.  Rhythm strips were evaluated by cardiologist Dr. Harl Bowie on 07/23/2022 ruled out atrial fibrillation, IV fluids, beta-blocker IV push and oral, stable TSH & echo.  He had another run of atrial tachycardia on 07/25/2022 with rate going into 180s, repeat EKG, question if he is having some runs of A-fib mixed with atrial tachycardia, will request cardiology to formally evaluate, will continue hydration and beta-blocker for now, if rate and blood pressure remain challenging may require digoxin or amiodarone as well.  Mild metabolic encephalopathy.  At times getting confused.  Has no recollection of seeing me yesterday, was upset that the doctor did not come and update him at 2 AM in the morning for his nausea vomiting.  Brother confirms that he has some memory issues and can get confused from time to time.  Minimize narcotics and benzodiazepines.  At risk for delirium.      Condition - Fair  Family Communication  : Brother Nicki Reaper [phone] updated in detail on 07/23/2022, 07/24/22.  Code Status :  Full  Consults  :  Cards  PUD  Prophylaxis :    Procedures  :     CT abdomen pelvis.  1. Bilateral nonobstructive nephrolithiasis. 2. Acute fractures of the left seventh, eighth, and ninth ribs, with the seventh rib fracture displaced by about 2 mm. These fractures were reported on prior CTA chest of 07/21/2022. 3. Circumferential distal esophageal wall thickening is nonspecific but distal esophagitis would be a common cause for this appearance. 4. Diffuse hepatic steatosis. 5. Lower lumbar degenerative disc disease causing bilateral foraminal impingement at L4-5 and L5-S1. 6. 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 7. Aortic atherosclerosis. Aortic Atherosclerosis.  EEG - Non acute  Carotid Doppler.  Non acute  TTE - 1. Windows not well visualized. Cannot assess wall motion. Left ventricular ejection fraction, by estimation, is 60 to 65%. The left ventricle has normal function.  2. Right ventricular systolic function is normal. The right ventricular size is normal.  3. No evidence of mitral valve regurgitation.  4. Aortic valve regurgitation is not visualized.  5. The inferior vena cava is normal in size with greater than 50% respiratory variability, suggesting right atrial pressure of 3 mmHg.  CTA -  Fractures of the left sixth, seventh and eighth ribs without complicating factors. No evidence of pulmonary emboli. Mild right basilar atelectasis. Aortic Atherosclerosis  CT Head - Non acute  CT C Spine - 1. No acute cervical spine fracture. 2. Prior C4-5 ACDF, with multilevel spondylosis and facet hypertrophy as above.       Disposition Plan  :    Status is: Observation  DVT Prophylaxis  :    SCDs Start: 07/21/22 2209     Lab Results  Component Value Date   PLT 273 07/25/2022    Diet :  Diet Order             DIET SOFT Fluid consistency:  Thin  Diet effective now                    Inpatient Medications  Scheduled  Meds:  DULoxetine  40 mg Oral Daily   fentaNYL  1 patch Transdermal Q72H   furosemide  40 mg Oral Once   gabapentin  300 mg Oral Daily   insulin aspart  0-9 Units Subcutaneous Q4H   metoCLOPramide (REGLAN) injection  5 mg Intravenous Q8H   metoprolol tartrate  50 mg Oral BID   pantoprazole (PROTONIX) IV  40 mg Intravenous Q12H   senna  2 tablet Oral Daily   simvastatin  20 mg Oral Daily   Continuous Infusions:   PRN Meds:.acetaminophen **OR** acetaminophen, HYDROcodone-acetaminophen, HYDROmorphone (DILAUDID) injection, ondansetron (ZOFRAN) IV, traMADol  Antibiotics  :    Anti-infectives (From admission, onward)    None         Objective:   Vitals:   07/24/22 2015 07/24/22 2103 07/24/22 2347 07/25/22 0449  BP: (!) 157/82  (!) 147/80 116/73  Pulse: 79  84   Resp: 19  18 20   Temp: 98.2 F (36.8 C)  97.9 F (36.6 C) 97.8 F (36.6 C)  TempSrc: Oral  Oral Oral  SpO2: 93% 95%  92%  Weight:      Height:        Wt Readings from Last 3 Encounters:  07/21/22 112.8 kg  07/05/22 117.9 kg  05/21/22 117.9 kg     Intake/Output Summary (Last 24 hours) at 07/25/2022 0802 Last data filed at 07/25/2022 [id number] Gross per 24 hour  Intake 480 ml  Output 650 ml  Net -170 ml     Physical Exam  Awake Alert, No new F.N deficits, Normal affect [place].AT,PERRAL Supple Neck, No JVD, C Collar in place  Symmetrical Chest wall movement, Good air movement bilaterally, CTAB RRR,No Gallops,Rubs or new Murmurs,  +ve B.Sounds, Abd Soft, No tenderness,   No Cyanosis, Clubbing or edema      Data Review:    Recent Labs  Lab 07/21/22 1710 07/22/22 0702 07/23/22 0334 07/24/22 0308 07/25/22 0214  WBC 11.7* 11.2* 11.7* 14.7* 13.9*  HGB 13.1 14.2 16.0 14.7 14.8  HCT 37.9* 40.7 46.1 42.5 44.6  PLT 217 263 254 269 273  MCV 83.8 82.7 83.1 83.7 85.9  MCH 29.0 28.9 28.8 28.9 28.5  MCHC 34.6 34.9 34.7 34.6 33.2  RDW 13.2 13.2 13.0 13.0 13.0  LYMPHSABS 0.9  --  1.0 2.2 2.2  MONOABS 0.6  --   0.8 1.3* 1.1*  EOSABS 0.0  --  0.0 0.1 0.1  BASOSABS 0.1  --  0.1 0.1 0.1    Recent Labs  Lab 07/21/22 1710 07/21/22 1907 07/21/22 1940 07/21/22 2216 07/22/22 0135 07/22/22 0434 07/22/22 0702 07/22/22 0704 07/23/22 0334 07/24/22 0308 07/25/22 0214  NA 134*  --   --   --  133*  --  137  --  136 133* 131*  K 2.7*  --   --   --  3.9  --  3.6  --  3.0* 3.7 3.5  CL 102  --   --   --  100  --  101  --  98 100 96*  CO2 19*  --   --   --  21*  --  28  --  24 25 26   ANIONGAP 13  --   --   --  12  --  8  --  14 8 9   GLUCOSE  280*  --   --   --  258*  --  166*  --  234* 188* 162*  BUN 24*  --   --   --  24*  --  22  --  15 16 13   CREATININE 0.82  --   --   --  1.02  --  0.97  --  0.85 0.74 0.79  AST 46*  --   --   --   --   --  26  --  30 14* 17  ALT 37  --   --   --   --   --  31  --  37 28 26  ALKPHOS 45  --   --   --   --   --  46  --  50 48 48  BILITOT 1.0  --   --   --   --   --  0.7  --  1.1 1.2 1.0  ALBUMIN 3.1*  --   --   --   --   --  3.1*  --  3.4* 3.1* 3.0*  DDIMER 1.43*  --   --   --   --   --   --   --   --   --   --   LATICACIDVEN  --   --  1.7 2.2* 2.4* 1.9  --   --   --   --   --   INR 1.2  --   --   --   --   --   --   --   --   --   --   TSH  --  0.453  --   --   --   --   --   --   --   --   --   HGBA1C 8.0*  --   --   --   --   --   --   --   --   --   --   BNP  --   --   --   --   --   --   --  67.8 75.0 86.8 182.0*  MG 1.6*  --   --   --   --   --  2.1  --  1.8 2.1 1.8  CALCIUM 8.4*  --   --   --  8.9  --  9.0  --  9.6 8.9 9.0    Lab Results  Component Value Date   HGBA1C 8.0 (H) 07/21/2022    No results for input(s): "TSH", "T4TOTAL", "T3FREE", "THYROIDAB" in the last 72 hours.  Invalid input(s): "FREET3"  Radiology Reports CT ABDOMEN PELVIS WO CONTRAST  Result Date: 07/23/2022 CLINICAL DATA:  Abdominal pain EXAM: CT ABDOMEN AND PELVIS WITHOUT CONTRAST TECHNIQUE: Multidetector CT imaging of the abdomen and pelvis was performed following the standard  protocol without IV contrast. RADIATION DOSE REDUCTION: This exam was performed according to the departmental dose-optimization program which includes automated exposure control, adjustment of the mA and/or kV according to patient size and/or use of iterative reconstruction technique. COMPARISON:  05/21/2022 FINDINGS: Lower chest: Coronary and descending thoracic aortic atherosclerosis. Stable scarring in the right lower lobe. Circumferential distal esophageal wall thickening is nonspecific but distal esophagitis would be a common cause for this appearance. Mildly elevated right hemidiaphragm. Hepatobiliary: Diffuse hepatic steatosis. Gallbladder unremarkable. No biliary dilatation. Pancreas: Unremarkable Spleen: Unremarkable Adrenals/Urinary Tract: Both adrenal glands appear normal. Spill  to nonobstructive right renal calculi, the largest in the right kidney upper pole measuring 0.7 cm in long axis. Three nonobstructive left renal calculi, the largest measuring 0.6 cm in long axis in the left kidney upper pole. No hydronephrosis or hydroureter. Moderately accentuated density of fluid in the urinary bladder, probably from previous contrast administration. Adrenal glands unremarkable. Stomach/Bowel: Unremarkable.  Normal appendix. Vascular/Lymphatic: Atherosclerosis is present, including aortoiliac atherosclerotic disease. 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 Reproductive: Unremarkable Other: No supplemental non-categorized findings. Musculoskeletal: Acute fractures of the left seventh, eighth, and ninth ribs, with the seventh rib fracture displaced by about 2 mm. These fractures were reported on prior CTA chest of 07/21/2022. Bridging spurring of the right sacroiliac joint. Thoracic and lumbar spondylosis with lower lumbar degenerative disc disease, resulting in bilateral foraminal impingement at L5-S1 and L4-5. IMPRESSION: 1.  Bilateral nonobstructive nephrolithiasis. 2. Acute fractures of the left seventh, eighth, and ninth ribs, with the seventh rib fracture displaced by about 2 mm. These fractures were reported on prior CTA chest of 07/21/2022. 3. Circumferential distal esophageal wall thickening is nonspecific but distal esophagitis would be a common cause for this appearance. 4. Diffuse hepatic steatosis. 5. Lower lumbar degenerative disc disease causing bilateral foraminal impingement at L4-5 and L5-S1. 6. 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 7. Aortic atherosclerosis. Aortic Atherosclerosis (ICD10-I70.0). Electronically Signed   By: Van Clines M.D.   On: 07/23/2022 12:38   VAS US CAROTID  Result Date: 07/23/2022 Carotid Arterial Duplex Study Patient Name:  Alan Lin  Date of Exam:   07/22/2022 Medical Rec #: [id number]         Accession #:    [id number] Date of Birth: 06-12-1956          Patient Gender: M Patient Age:   29 years Exam Location:  [facility] Procedure:      VAS US CAROTID Referring Phys: Nyoka Lint DOUTOVA --------------------------------------------------------------------------------  Indications:       Syncope. Risk Factors:      Hypertension, hyperlipidemia, Diabetes, no history of                    smoking. Limitations        Today's exam was limited due to Calcific shadowing, poor                    ultrasound tissue/interface and the body habitus of the                    patient. Comparison Study:  No previous exams Performing Technologist: Jody Hill RVT, RDMS  Examination Guidelines: A complete evaluation includes B-mode imaging, spectral Doppler, color Doppler, and power Doppler as needed of all accessible portions of each vessel. Bilateral testing is considered an integral part of a complete examination. Limited examinations for reoccurring indications may be performed as noted.  Right Carotid  Findings: +----------+-------+--------+--------+----------------------+------------------+           PSV    EDV cm/sStenosisPlaque Description    Comments                     cm/s                                                            +----------+-------+--------+--------+----------------------+------------------+  CCA Prox  59     9               hyperechoic and       intimal thickening                                  calcific                                 +----------+-------+--------+--------+----------------------+------------------+ CCA Distal51     6                                     intimal thickening +----------+-------+--------+--------+----------------------+------------------+ ICA Prox  39     7               calcific              Shadowing          +----------+-------+--------+--------+----------------------+------------------+ ICA Distal53     13              calcific                                 +----------+-------+--------+--------+----------------------+------------------+ ECA       123    9               calcific                                 +----------+-------+--------+--------+----------------------+------------------+ +----------+--------+-------+----------------+-------------------+           PSV cm/sEDV cmsDescribe        Arm Pressure (mmHG) +----------+--------+-------+----------------+-------------------+ [id number]             Multiphasic, WNL                    +----------+--------+-------+----------------+-------------------+ +---------+--------+--+--------+--+---------+ VertebralPSV cm/s54EDV cm/s11Antegrade +---------+--------+--+--------+--+---------+  Left Carotid Findings: +----------+-------+--------+--------+-----------------------+-----------------+           PSV    EDV cm/sStenosisPlaque Description     Comments                    cm/s                                                             +----------+-------+--------+--------+-----------------------+-----------------+ CCA Prox  88     8               calcific               intimal                                                                   thickening        +----------+-------+--------+--------+-----------------------+-----------------+ CCA Distal85  11              heterogenous and       intimal                                            calcific               thickening        +----------+-------+--------+--------+-----------------------+-----------------+ ICA Prox  58     14              calcific               Shadowing         +----------+-------+--------+--------+-----------------------+-----------------+ ICA Distal50     16                                                       +----------+-------+--------+--------+-----------------------+-----------------+ ECA       134                                           shadowing         +----------+-------+--------+--------+-----------------------+-----------------+ +----------+--------+--------+----------------+-------------------+           PSV cm/sEDV cm/sDescribe        Arm Pressure (mmHG) +----------+--------+--------+----------------+-------------------+ [id number]             Multiphasic, WNL                    +----------+--------+--------+----------------+-------------------+ +---------+--------+--+--------+--------------+ VertebralPSV cm/s32EDV cm/sHigh resistant +---------+--------+--+--------+--------------+   Summary: Right Carotid: Velocities in the right ICA are consistent with a 1-39% stenosis. Left Carotid: Velocities in the left ICA are consistent with a 1-39% stenosis.               The extracranial vessels were near-normal with only minimal wall               thickening or plaque. Vertebrals:  Right vertebral artery demonstrates antegrade flow. Left vertebral              artery  demonstrates high resistant flow. Subclavians: Normal flow hemodynamics were seen in bilateral subclavian              arteries. *See table(s) above for measurements and observations.  Electronically signed by Orlie Pollen on 07/23/2022 at 10:34:35 AM.    Final    DG Abd 1 View  Result Date: 07/23/2022 CLINICAL DATA:  Vomiting with nausea EXAM: ABDOMEN - 1 VIEW COMPARISON:  None Available. FINDINGS: The stomach is distended. Otherwise limited visualization of bowel gas pattern due to body habitus. IMPRESSION: No dilated small bowel visible, but study limited by body habitus. Electronically Signed   By: Ulyses Jarred M.D.   On: 07/23/2022 03:24   EEG adult  Result Date: 07/22/2022 Lora Havens, MD     07/22/2022  5:01 PM Patient Name: Alan Lin MRN: [id number] Epilepsy Attending: Lora Havens Referring Physician/Provider: Thurnell Lose, MD Date: 07/22/2022 Duration: 25.06 mins Patient history: 67yo M with syncope. EEG to evaluate for seizure Level of alertness: Awake AEDs during EEG study: None Technical  aspects: This EEG study was done with scalp electrodes positioned according to the 10-20 International system of electrode placement. Electrical activity was reviewed with band pass filter of 1-70Hz , sensitivity of 7 uV/mm, display speed of 78mm/sec with a 60Hz  notched filter applied as appropriate. EEG data were recorded continuously and digitally stored.  Video monitoring was available and reviewed as appropriate. Description: The posterior dominant rhythm consists of 9 Hz activity of moderate voltage (25-35 uV) seen predominantly in posterior head regions, symmetric and reactive to eye opening and eye closing. Physiologic photic driving was seen during photic stimulation.  Hyperventilation was not performed.   IMPRESSION: This study is within normal limits. No seizures or epileptiform discharges were seen throughout the recording. A normal interictal EEG does not exclude  the diagnosis of  epilepsy. Lora Havens   ECHOCARDIOGRAM COMPLETE  Result Date: 07/22/2022    ECHOCARDIOGRAM REPORT   Patient Name:   Alan Lin Date of Exam: 07/22/2022 Medical Rec #:  [id number]        Height:       70.0 in Accession #:    [id number]       Weight:       248.7 lb Date of Birth:  1956-02-03         BSA:          2.290 m Patient Age:    56 years         BP:           117/68 mmHg Patient Gender: M                HR:           89 bpm. Exam Location:  Inpatient Procedure: 2D Echo and Intracardiac Opacification Agent Indications:    syncope  History:        Patient has no prior history of Echocardiogram examinations.                 Risk Factors:Hypertension, Diabetes and Dyslipidemia.  Sonographer:    Harvie Junior Referring Phys: [id number] ANASTASSIA DOUTOVA  Sonographer Comments: Technically difficult study due to poor echo windows, suboptimal apical window, suboptimal parasternal window, suboptimal subcostal window and patient is obese. Image acquisition challenging due to patient body habitus, Image acquisition challenging due to respiratory motion and broken ribs. IMPRESSIONS  1. Windows not well visualized. Cannot assess wall motion. Left ventricular ejection fraction, by estimation, is 60 to 65%. The left ventricle has normal function.  2. Right ventricular systolic function is normal. The right ventricular size is normal.  3. No evidence of mitral valve regurgitation.  4. Aortic valve regurgitation is not visualized.  5. The inferior vena cava is normal in size with greater than 50% respiratory variability, suggesting right atrial pressure of 3 mmHg. FINDINGS  Left Ventricle: Windows not well visualized. Cannot assess wall motion. Left ventricular ejection fraction, by estimation, is 60 to 65%. The left ventricle has normal function. Definity contrast agent was given IV to delineate the left ventricular endocardial borders. The left ventricular internal cavity size was normal in size. Right Ventricle: The  right ventricular size is normal. Right ventricular systolic function is normal. Left Atrium: Left atrial size was not well visualized. Right Atrium: Right atrial size was not well visualized. Pericardium: The pericardium was not well visualized. Mitral Valve: No evidence of mitral valve regurgitation. Tricuspid Valve: Tricuspid valve regurgitation is not demonstrated. Aortic Valve: Aortic valve regurgitation is not visualized. Aortic valve mean gradient measures  3.0 mmHg. Aortic valve peak gradient measures 6.0 mmHg. Aortic valve area, by VTI measures 2.25 cm. Pulmonic Valve: Pulmonic valve regurgitation is not visualized. Aorta: The aortic root is normal in size and structure. Venous: The inferior vena cava is normal in size with greater than 50% respiratory variability, suggesting right atrial pressure of 3 mmHg. IAS/Shunts: The interatrial septum was not well visualized.  LEFT VENTRICLE PLAX 2D LVIDd:         4.20 cm     Diastology LVIDs:         2.80 cm     LV e' medial:    7.29 cm/s LV PW:         1.00 cm     LV E/e' medial:  7.6 LV IVS:        0.90 cm     LV e' lateral:   8.05 cm/s LVOT diam:     2.30 cm     LV E/e' lateral: 6.9 LV SV:         51 LV SV Index:   22 LVOT Area:     4.15 cm  LV Volumes (MOD) LV vol d, MOD A2C: 75.4 ml LV vol d, MOD A4C: 92.6 ml LV vol s, MOD A2C: 25.3 ml LV vol s, MOD A4C: 28.8 ml LV SV MOD A2C:     50.1 ml LV SV MOD A4C:     92.6 ml LV SV MOD BP:      59.1 ml RIGHT VENTRICLE RV S prime:     16.00 cm/s TAPSE (M-mode): 2.0 cm LEFT ATRIUM           Index LA diam:      3.70 cm 1.62 cm/m LA Vol (A4C): 58.6 ml 25.59 ml/m  AORTIC VALVE                    PULMONIC VALVE AV Area (Vmax):    2.14 cm     PV Vmax:       0.99 m/s AV Area (Vmean):   2.10 cm     PV Peak grad:  3.9 mmHg AV Area (VTI):     2.25 cm AV Vmax:           122.00 cm/s AV Vmean:          81.900 cm/s AV VTI:            0.227 m AV Peak Grad:      6.0 mmHg AV Mean Grad:      3.0 mmHg LVOT Vmax:         62.70 cm/s  LVOT Vmean:        41.400 cm/s LVOT VTI:          0.123 m LVOT/AV VTI ratio: 0.54  AORTA Ao Root diam: 3.50 cm MITRAL VALVE MV Area (PHT): 3.57 cm    SHUNTS MV Decel Time: 213 msec    Systemic VTI:  0.12 m MV E velocity: 55.25 cm/s  Systemic Diam: 2.30 cm MV A velocity: 51.45 cm/s MV E/A ratio:  1.07 Landscape architect signed by Phineas Inches Signature Date/Time: 07/22/2022/9:56:01 AM    Final    CT Angio Chest Pulmonary Embolism (PE) W or WO Contrast  Result Date: 07/22/2022 CLINICAL DATA:  Difficulty breathing EXAM: CT ANGIOGRAPHY CHEST WITH CONTRAST TECHNIQUE: Multidetector CT imaging of the chest was performed using the standard protocol during bolus administration of intravenous contrast. Multiplanar CT image reconstructions and MIPs were obtained to evaluate  the vascular anatomy. RADIATION DOSE REDUCTION: This exam was performed according to the departmental dose-optimization program which includes automated exposure control, adjustment of the mA and/or kV according to patient size and/or use of iterative reconstruction technique. CONTRAST:  39mL OMNIPAQUE IOHEXOL 350 MG/ML SOLN COMPARISON:  Chest x-ray from earlier in the same day. FINDINGS: Cardiovascular: Atherosclerotic calcifications of the thoracic aorta are noted. No aneurysmal dilatation or dissection is noted. No cardiac enlargement is seen. Coronary calcifications are seen. The pulmonary artery shows a normal branching pattern bilaterally. No intraluminal filling defect to suggest pulmonary embolism is seen. Mediastinum/Nodes: Thoracic inlet is within normal limits. No hilar or mediastinal adenopathy is noted. The esophagus is within normal limits. Lungs/Pleura: Minimal right basilar atelectasis is noted. The lungs are otherwise well aerated. No focal infiltrate or effusion is seen. Upper Abdomen: Visualized upper abdomen is within normal limits. Musculoskeletal: Degenerative changes of the thoracic spine are noted. Mildly displaced fractures  of the left sixth, seventh and eighth ribs are noted without complicating factors. Review of the MIP images confirms the above findings. IMPRESSION: Fractures of the left sixth, seventh and eighth ribs without complicating factors. No evidence of pulmonary emboli. Mild right basilar atelectasis. Aortic Atherosclerosis (ICD10-I70.0). Electronically Signed   By: Alcide Clever M.D.   On: 07/22/2022 00:00   CT Cervical Spine Wo Contrast  Result Date: 07/21/2022 CLINICAL DATA:  Larey Seat, loss of consciousness EXAM: CT CERVICAL SPINE WITHOUT CONTRAST TECHNIQUE: Multidetector CT imaging of the cervical spine was performed without intravenous contrast. Multiplanar CT image reconstructions were also generated. RADIATION DOSE REDUCTION: This exam was performed according to the departmental dose-optimization program which includes automated exposure control, adjustment of the mA and/or kV according to patient size and/or use of iterative reconstruction technique. COMPARISON:  05/21/2022 FINDINGS: Alignment: Alignment is grossly anatomic. Skull base and vertebrae: Postsurgical changes at C4-5. There are no acute or destructive bony abnormalities. Soft tissues and spinal canal: No prevertebral fluid or swelling. No visible canal hematoma. Prominent atherosclerosis of the carotid bifurcations. Disc levels: Prior ACDF at the C4-5 level. There is bony fusion across the right C3-4 and C4-5 facet joints. Marked spondylosis at C5-6 and C6-7, with left greater than right neural foraminal encroachment. Upper chest: Airway is patent.  Lung apices are clear. Other: Reconstructed images demonstrate no additional findings. IMPRESSION: 1. No acute cervical spine fracture. 2. Prior C4-5 ACDF, with multilevel spondylosis and facet hypertrophy as above. Electronically Signed   By: Sharlet Salina M.D.   On: 07/21/2022 18:02   CT Head Wo Contrast  Result Date: 07/21/2022 CLINICAL DATA:  Larey Seat, loss of consciousness EXAM: CT HEAD WITHOUT CONTRAST  TECHNIQUE: Contiguous axial images were obtained from the base of the skull through the vertex without intravenous contrast. RADIATION DOSE REDUCTION: This exam was performed according to the departmental dose-optimization program which includes automated exposure control, adjustment of the mA and/or kV according to patient size and/or use of iterative reconstruction technique. COMPARISON:  06/17/2022 FINDINGS: Brain: No acute infarct or hemorrhage. Lateral ventricles and midline structures are unremarkable. No acute extra-axial fluid collections. No mass effect. Vascular: No hyperdense vessel or unexpected calcification. Skull: Normal. Negative for fracture or focal lesion. Sinuses/Orbits: No acute finding. Other: None. IMPRESSION: 1. Stable head CT, no acute intracranial process. Electronically Signed   By: Sharlet Salina M.D.   On: 07/21/2022 17:59   DG Ribs Unilateral W/Chest Left  Result Date: 07/21/2022 CLINICAL DATA:  Multiple falls today with left-sided chest pain, initial encounter EXAM: LEFT RIBS  AND CHEST - 3+ VIEW COMPARISON:  05/21/2022 FINDINGS: Check shadow is within normal limits. Aortic calcifications are seen. The lungs are hypoinflated. No infiltrate, effusion or pneumothorax is seen. Undisplaced posterolateral left eighth rib fracture is seen. No other fractures are seen. IMPRESSION: Left eighth rib fracture as described without complicating factors. Electronically Signed   By: Inez Catalina M.D.   On: 07/21/2022 17:59      Signature  -   Lala Lund M.D on 07/25/2022 at 8:02 AM   -  To page go to [email or web address]

## 2022-07-25 NOTE — Progress Notes (Signed)
Mobility Specialist Progress Note   07/25/22 1800  Mobility  Activity Transferred from bed to chair  Level of Assistance Moderate assist, patient does 50-74%  Assistive Device Front wheel walker  Distance Ambulated (ft) 2 ft  Activity Response Tolerated well  Mobility Referral Yes  $Mobility charge 1 Mobility   Pt limited by pain during transfer to chair. Pt preferring to leave bed from right side and requiring modA HHA to get to EOB via UE support and pads. MinA to stand and pivot to chair no fault but pain present throughout in L ribs. Call bell in reach and chair alarm on.  Holland Falling Mobility Specialist Please contact via SecureChat or  Rehab office at [phone]

## 2022-07-25 NOTE — Progress Notes (Signed)
ANTICOAGULATION CONSULT NOTE  Pharmacy Consult for Heparin Indication: atrial fibrillation  Allergies  Allergen Reactions   Metformin Diarrhea and Nausea And Vomiting   Oxycodone Other (See Comments)    Constipation   Oxycodone-Acetaminophen     Constipation   Lisinopril Cough   Morphine Itching    Other reaction(s): Other (See Comments)  Constipation   Morphine And Related Itching    Patient Measurements: Height: 5\' 10"  (177.8 cm) Weight: 112.8 kg (248 lb 10.9 oz) IBW/kg (Calculated) : 73 Heparin Dosing Weight: 100  Vital Signs: Temp: 97.5 F (36.4 C) (02/05 0842) Temp Source: Oral (02/05 0842) BP: 99/60 (02/05 0952) Pulse Rate: 92 (02/05 0952)  Labs: Recent Labs    07/23/22 0334 07/24/22 0308 07/25/22 0214  HGB 16.0 14.7 14.8  HCT 46.1 42.5 44.6  PLT 254 269 273  CREATININE 0.85 0.74 0.79    Estimated Creatinine Clearance: 114.2 mL/min (by C-G formula based on SCr of 0.79 mg/dL).  Assessment: 67 year old male to begin heparin for Afib  Goal of Therapy:  Heparin level 0.3-0.7 units/ml Monitor platelets by anticoagulation protocol: Yes   Plan:  Heparin 4000 units iv bolus x 1 Heparin infusion at 1400 units / hr Heparin level in 6 hours Daily heparin level, CBC  Thank you Anette Guarneri, PharmD  07/25/2022,11:25 AM

## 2022-07-25 NOTE — Telephone Encounter (Signed)
Patient is still Admitted at the hospital

## 2022-07-26 ENCOUNTER — Encounter (HOSPITAL_COMMUNITY)
Admission: EM | Disposition: A | Discharge status: Skilled Nursing Facility | Payer: Self-pay | Source: Home / Self Care | Attending: Internal Medicine

## 2022-07-26 ENCOUNTER — Inpatient Hospital Stay (HOSPITAL_COMMUNITY): Payer: Medicare Other | Admitting: Anesthesiology

## 2022-07-26 ENCOUNTER — Encounter (HOSPITAL_COMMUNITY): Payer: Self-pay | Admitting: Internal Medicine

## 2022-07-26 ENCOUNTER — Other Ambulatory Visit (HOSPITAL_COMMUNITY): Payer: Medicare Other

## 2022-07-26 ENCOUNTER — Other Ambulatory Visit (HOSPITAL_COMMUNITY): Payer: Self-pay

## 2022-07-26 DIAGNOSIS — R55 Syncope and collapse: Secondary | ICD-10-CM | POA: Diagnosis not present

## 2022-07-26 LAB — CBC WITH DIFFERENTIAL/PLATELET
Abs Immature Granulocytes: 0.18 10*3/uL — ABNORMAL HIGH (ref 0.00–0.07)
Basophils Absolute: 0.1 10*3/uL (ref 0.0–0.1)
Basophils Relative: 1 %
Eosinophils Absolute: 0.1 10*3/uL (ref 0.0–0.5)
Eosinophils Relative: 1 %
HCT: 41.6 % (ref 39.0–52.0)
Hemoglobin: 14.5 g/dL (ref 13.0–17.0)
Immature Granulocytes: 2 %
Lymphocytes Relative: 18 %
Lymphs Abs: 2.1 10*3/uL (ref 0.7–4.0)
MCH: 28.9 pg (ref 26.0–34.0)
MCHC: 34.9 g/dL (ref 30.0–36.0)
MCV: 82.9 fL (ref 80.0–100.0)
Monocytes Absolute: 1 10*3/uL (ref 0.1–1.0)
Monocytes Relative: 9 %
Neutro Abs: 8.1 10*3/uL — ABNORMAL HIGH (ref 1.7–7.7)
Neutrophils Relative %: 69 %
Platelets: 246 10*3/uL (ref 150–400)
RBC: 5.02 MIL/uL (ref 4.22–5.81)
RDW: 12.8 % (ref 11.5–15.5)
WBC: 11.6 10*3/uL — ABNORMAL HIGH (ref 4.0–10.5)
nRBC: 0 % (ref 0.0–0.2)

## 2022-07-26 LAB — MAGNESIUM: Magnesium: 1.8 mg/dL (ref 1.7–2.4)

## 2022-07-26 LAB — GLUCOSE, CAPILLARY
Glucose-Capillary: 238 mg/dL — ABNORMAL HIGH (ref 70–99)
Glucose-Capillary: 185 mg/dL — ABNORMAL HIGH (ref 70–99)
Glucose-Capillary: 191 mg/dL — ABNORMAL HIGH (ref 70–99)
Glucose-Capillary: 205 mg/dL — ABNORMAL HIGH (ref 70–99)
Glucose-Capillary: 209 mg/dL — ABNORMAL HIGH (ref 70–99)
Glucose-Capillary: 179 mg/dL — ABNORMAL HIGH (ref 70–99)
Glucose-Capillary: 184 mg/dL — ABNORMAL HIGH (ref 70–99)

## 2022-07-26 LAB — COMPREHENSIVE METABOLIC PANEL
ALT: 24 U/L (ref 0–44)
AST: 13 U/L — ABNORMAL LOW (ref 15–41)
Albumin: 2.7 g/dL — ABNORMAL LOW (ref 3.5–5.0)
Alkaline Phosphatase: 48 U/L (ref 38–126)
Anion gap: 9 (ref 5–15)
BUN: 14 mg/dL (ref 8–23)
CO2: 28 mmol/L (ref 22–32)
Calcium: 8.8 mg/dL — ABNORMAL LOW (ref 8.9–10.3)
Chloride: 93 mmol/L — ABNORMAL LOW (ref 98–111)
Creatinine, Ser: 0.88 mg/dL (ref 0.61–1.24)
GFR, Estimated: >60 mL/min (ref >60)
Glucose, Bld: 186 mg/dL — ABNORMAL HIGH (ref 70–99)
Potassium: 3.4 mmol/L — ABNORMAL LOW (ref 3.5–5.1)
Sodium: 130 mmol/L — ABNORMAL LOW (ref 135–145)
Total Bilirubin: 1 mg/dL (ref 0.3–1.2)
Total Protein: 5.1 g/dL — ABNORMAL LOW (ref 6.5–8.1)

## 2022-07-26 LAB — BRAIN NATRIURETIC PEPTIDE: B Natriuretic Peptide: 127.2 pg/mL — ABNORMAL HIGH (ref 0.0–100.0)

## 2022-07-26 LAB — HEPARIN LEVEL (UNFRACTIONATED): Heparin Unfractionated: 0.47 IU/mL (ref 0.30–0.70)

## 2022-07-26 SURGERY — CANCELLED PROCEDURE

## 2022-07-26 MED ORDER — POTASSIUM CHLORIDE 10 MEQ/100ML IV SOLN
10.0000 meq | INTRAVENOUS | Status: AC
  Administered 2022-07-26 (×4): 10 meq via INTRAVENOUS
  Filled 2022-07-26 (×4): qty 100

## 2022-07-26 MED ORDER — SODIUM CHLORIDE 0.9 % IV SOLN: INTRAVENOUS | Status: DC

## 2022-07-26 MED ORDER — AMIODARONE HCL 200 MG PO TABS
400.0000 mg | ORAL_TABLET | Freq: Two times a day (BID) | ORAL | Status: DC
  Administered 2022-07-26 – 2022-07-28 (×5): 400 mg via ORAL
  Filled 2022-07-26 (×5): qty 2

## 2022-07-26 MED ORDER — PANTOPRAZOLE SODIUM 40 MG PO TBEC
40.0000 mg | DELAYED_RELEASE_TABLET | Freq: Two times a day (BID) | ORAL | Status: DC
  Administered 2022-07-26 – 2022-07-28 (×5): 40 mg via ORAL
  Filled 2022-07-26 (×5): qty 1

## 2022-07-26 MED ORDER — SODIUM CHLORIDE 0.9 % IV SOLN: INTRAVENOUS | Status: AC

## 2022-07-26 NOTE — TOC Benefit Eligibility Note (Signed)
Patient Teacher, English as a foreign language completed.    The patient is currently admitted and upon discharge could be taking Eliquis 5 mg.  The current 30 day co-pay is $47.00.   The patient is insured through [employer], [employer] Patient [place] Patient Advocate Team Direct Number: [phone]  Fax: [phone]

## 2022-07-26 NOTE — Anesthesia Preprocedure Evaluation (Deleted)
Anesthesia Evaluation  Patient identified by MRN, date of birth, ID band Patient awake    Reviewed: Allergy & Precautions, NPO status , Patient's Chart, lab work & pertinent test results, reviewed documented beta blocker date and time   History of Anesthesia Complications Negative for: history of anesthetic complications  Airway Mallampati: II  TM Distance: >3 FB Neck ROM: Full    Dental no notable dental hx.    Pulmonary sleep apnea    Pulmonary exam normal        Cardiovascular hypertension, Pt. on medications and Pt. on home beta blockers Normal cardiovascular exam+ dysrhythmias Atrial Fibrillation   TTE 07/22/22: EF 60-65%, valves ok   Neuro/Psych   Anxiety     negative neurological ROS     GI/Hepatic negative GI ROS, Neg liver ROS,,,  Endo/Other  diabetes, Type 2    Renal/GU negative Renal ROS  negative genitourinary   Musculoskeletal  (+) Arthritis ,    Abdominal   Peds  Hematology negative hematology ROS (+)   Anesthesia Other Findings Day of surgery medications reviewed with patient.  Reproductive/Obstetrics negative OB ROS                              Anesthesia Physical Anesthesia Plan  ASA: 3  Anesthesia Plan: MAC   Post-op Pain Management: Minimal or no pain anticipated   Induction:   PONV Risk Score and Plan: 1 and Treatment may vary due to age or medical condition and Propofol infusion  Airway Management Planned: Natural Airway and Nasal Cannula  Additional Equipment: None  Intra-op Plan:   Post-operative Plan:   Informed Consent: I have reviewed the patients History and Physical, chart, labs and discussed the procedure including the risks, benefits and alternatives for the proposed anesthesia with the patient or authorized representative who has indicated his/her understanding and acceptance.       Plan Discussed with: CRNA  Anesthesia Plan Comments:          Anesthesia Quick Evaluation

## 2022-07-26 NOTE — Progress Notes (Signed)
PROGRESS NOTE                                                                                                                                                                                                             Patient Demographics:    Alan Lin, is a 67 y.o. male, DOB - 1955/12/17, ZOX:[id number]  Outpatient Primary MD for the patient is Doug Sou, MD    LOS - 4  Admit date - 07/21/2022    Chief Complaint  Patient presents with   Fall   Loss of Consciousness       Brief Narrative (HPI from H&P)   67 y.o. male with medical history significant of Dm2, HTN, falls.  Resented to the ER after an episode of diarrhea last night after which she took 1 Imodium, thereafter he went to the bathroom 4 AM in the morning where he syncopized once and twice again thereafter, he was slightly lightheaded during these episodes but did not have any chest pain or shortness of breath.  He recently had a C-spine surgery for which she wears a c-collar, during his falls he did hurt his left-sided rib cage.  Brought to the ER where head CT and C-spine CT were stable, CTA chest was stable, he did have fractures on his left rib cage, he was admitted for further workup for syncope.    Subjective:   Patient in bed, appears comfortable, denies any headache, no fever, no chest pain or pressure, no shortness of breath , no abdominal patient in bed, appears comfortable, denies any headache, no fever, no chest pain or pressure, no shortness of breath , no abdominal pain. No focal weakness.   Assessment  & Plan :   Multiple falls after syncope and lightheadedness in a patient with previous history of falls.  Unclear reason but could be due to orthostatic hypotension caused by an episode of diarrhea the night of admission.  CT head unremarkable, no focal deficits, EEG, echocardiogram and carotid venous duplex are stable, stable on telemetry, stable  orthostatics, seen by PT OT qualifies for SNF, hydrated IV fluids.  Much improved.  Have also ordered 30-day event monitor upon discharge.  Peripheral neuropathy.  Noted on EMG studies done outpatient recently.  Outpatient follow-up with his neurologist, PT OT here, may require placement.    Gastroenteritis.  Questionable food poisoning at night on 07/22/2022, CT abdomen pelvis suggests possible esophagitis, placed on twice daily PPI and supportive care with antinausea medicines and Reglan for a few days, GI on board.  No EGD for now per GI.  Symptoms much improved and almost completely resolved.  Discharge follow-up with [facility].  Chronic neck pain and C-spine surgery.  Continue c-collar on a as needed basis, patient was wearing it by his own choice, follow-up with orthopedic surgeon postdischarge.  Dehydration with hypomagnesemia, hypophosphatemia and hypokalemia.  IV fluids, replace electrolytes.  Hyponatremia.  Hydrate and monitor.  Left rib cage fractures.  Supportive care.  Pain medications adjusted for better control on 07/23/2022.  OSA.  CPAP at night.  Hypertension.  Pressure stable placed on beta-blocker.  New onset A-fib RVR [place] vas 2 score of at least 3 or above.  Rhythm strips were evaluated by cardiologist Dr. Harl Bowie on 07/23/2022 ruled out atrial fibrillation, he has been hydrated with IV fluids, placed on oral beta-blocker, stable TSH and echo, continues to have runs of tachycardia, initially he was thought to have sinus tachycardia with PACs but now cardiology thinks that he has paroxysmal atrial fibrillation, currently on amiodarone drip along with heparin drip, on oral beta-blocker as tolerated by blood pressure.  Per cardiology TEE followed by cardioversion on 07/26/2022.  Mild metabolic encephalopathy.  At times getting confused.  Has no recollection of seeing me yesterday, was upset that the doctor did not come and update him at 2 AM in the morning for his nausea vomiting.   Brother confirms that he has some memory issues and can get confused from time to time.  Minimize narcotics and benzodiazepines.  At risk for delirium.      Condition - Fair  Family Communication  : Brother Nicki Reaper [phone] updated in detail on 07/23/2022, 07/24/22.  Code Status :  Full  Consults  :  Cards  PUD Prophylaxis :    Procedures  :     Due for TEE and DC cardioversion on 07/26/2022   CT abdomen pelvis.  1. Bilateral nonobstructive nephrolithiasis. 2. Acute fractures of the left seventh, eighth, and ninth ribs, with the seventh rib fracture displaced by about 2 mm. These fractures were reported on prior CTA chest of 07/21/2022. 3. Circumferential distal esophageal wall thickening is nonspecific but distal esophagitis would be a common cause for this appearance. 4. Diffuse hepatic steatosis. 5. Lower lumbar degenerative disc disease causing bilateral foraminal impingement at L4-5 and L5-S1. 6. 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 7. Aortic atherosclerosis. Aortic Atherosclerosis.  EEG - Non acute  Carotid Doppler.  Non acute  TTE - 1. Windows not well visualized. Cannot assess wall motion. Left ventricular ejection fraction, by estimation, is 60 to 65%. The left ventricle has normal function.  2. Right ventricular systolic function is normal. The right ventricular size is normal.  3. No evidence of mitral valve regurgitation.  4. Aortic valve regurgitation is not visualized.  5. The inferior vena cava is normal in size with greater than 50% respiratory variability, suggesting right atrial pressure of 3 mmHg.  CTA -  Fractures of the left sixth, seventh and eighth ribs without complicating factors. No evidence of pulmonary emboli. Mild right basilar atelectasis. Aortic Atherosclerosis  CT Head - Non acute  CT C Spine - 1. No acute cervical spine fracture. 2. Prior C4-5 ACDF, with multilevel  spondylosis and facet hypertrophy as above.  Disposition Plan  :    Status is: Observation  DVT Prophylaxis  :    SCDs Start: 07/21/22 2209     Lab Results  Component Value Date   PLT 246 07/26/2022    Diet :  Diet Order             Diet NPO time specified Except for: Sips with Meds  Diet effective midnight                    Inpatient Medications  Scheduled Meds:  DULoxetine  40 mg Oral Daily   fentaNYL  1 patch Transdermal Q72H   gabapentin  300 mg Oral Daily   insulin aspart  0-9 Units Subcutaneous Q4H   metoCLOPramide (REGLAN) injection  5 mg Intravenous Q8H   metoprolol succinate  50 mg Oral BID   pantoprazole  40 mg Oral BID   senna  2 tablet Oral Daily   simvastatin  20 mg Oral Daily   Continuous Infusions:  sodium chloride 100 mL/hr at 07/26/22 0634   amiodarone 30 mg/hr (07/25/22 2329)   heparin 1,700 Units/hr (07/25/22 2331)   potassium chloride 10 mEq (07/26/22 0800)    PRN Meds:.acetaminophen **OR** acetaminophen, diltiazem, HYDROcodone-acetaminophen, HYDROmorphone (DILAUDID) injection, metoprolol tartrate, ondansetron (ZOFRAN) IV, traMADol  Antibiotics  :    Anti-infectives (From admission, onward)    None         Objective:   Vitals:   07/26/22 0001 07/26/22 0348 07/26/22 0600 07/26/22 0741  BP: (!) 171/94 (!) 142/75 (!) 154/80 (!) 151/76  Pulse: 81 77 75 77  Resp: 17 16 15 16   Temp: 97.6 F (36.4 C) 98 F (36.7 C)  98.4 F (36.9 C)  TempSrc: Axillary Oral  Oral  SpO2: 95% 92% 92% 94%  Weight:      Height:        Wt Readings from Last 3 Encounters:  07/21/22 112.8 kg  07/05/22 117.9 kg  05/21/22 117.9 kg     Intake/Output Summary (Last 24 hours) at 07/26/2022 0829 Last data filed at 07/26/2022 0120 Gross per 24 hour  Intake --  Output 1050 ml  Net -1050 ml     Physical Exam  Awake Alert, No new F.N deficits, Normal affect [place].AT,PERRAL Supple Neck, No JVD, C Collar in place  Symmetrical Chest wall  movement, Good air movement bilaterally, CTAB RRR,No Gallops,Rubs or new Murmurs,  +ve B.Sounds, Abd Soft, No tenderness,   No Cyanosis, Clubbing or edema      Data Review:    Recent Labs  Lab 07/21/22 1710 07/22/22 0702 07/23/22 0334 07/24/22 0308 07/25/22 0214 07/26/22 0506  WBC 11.7* 11.2* 11.7* 14.7* 13.9* 11.6*  HGB 13.1 14.2 16.0 14.7 14.8 14.5  HCT 37.9* 40.7 46.1 42.5 44.6 41.6  PLT 217 263 254 269 273 246  MCV 83.8 82.7 83.1 83.7 85.9 82.9  MCH 29.0 28.9 28.8 28.9 28.5 28.9  MCHC 34.6 34.9 34.7 34.6 33.2 34.9  RDW 13.2 13.2 13.0 13.0 13.0 12.8  LYMPHSABS 0.9  --  1.0 2.2 2.2 2.1  MONOABS 0.6  --  0.8 1.3* 1.1* 1.0  EOSABS 0.0  --  0.0 0.1 0.1 0.1  BASOSABS 0.1  --  0.1 0.1 0.1 0.1    Recent Labs  Lab 07/21/22 1710 07/21/22 1907 07/21/22 1940 07/21/22 2216 07/22/22 0135 07/22/22 0434 07/22/22 0702 07/22/22 0704 07/23/22 0334 07/24/22 0308 07/25/22 0214 07/25/22 1824 07/26/22 0506  NA 134*  --   --   --  133*  --  137  --  136 133* 131*  --  130*  K 2.7*  --   --   --  3.9  --  3.6  --  3.0* 3.7 3.5  --  3.4*  CL 102  --   --   --  100  --  101  --  98 100 96*  --  93*  CO2 19*  --   --   --  21*  --  28  --  24 25 26   --  28  ANIONGAP 13  --   --   --  12  --  8  --  14 8 9   --  9  GLUCOSE 280*  --   --   --  258*  --  166*  --  234* 188* 162*  --  186*  BUN 24*  --   --   --  24*  --  22  --  15 16 13   --  14  CREATININE 0.82  --   --   --  1.02  --  0.97  --  0.85 0.74 0.79  --  0.88  AST 46*  --   --   --   --   --  26  --  30 14* 17  --  13*  ALT 37  --   --   --   --   --  31  --  37 28 26  --  24  ALKPHOS 45  --   --   --   --   --  46  --  50 48 48  --  48  BILITOT 1.0  --   --   --   --   --  0.7  --  1.1 1.2 1.0  --  1.0  ALBUMIN 3.1*  --   --   --   --   --  3.1*  --  3.4* 3.1* 3.0*  --  2.7*  DDIMER 1.43*  --   --   --   --   --   --   --   --   --   --   --   --   LATICACIDVEN  --   --  1.7 2.2* 2.4* 1.9  --   --   --   --   --   --   --    INR 1.2  --   --   --   --   --   --   --   --   --   --  1.1  --   TSH  --  0.453  --   --   --   --   --   --   --   --   --   --   --   HGBA1C 8.0*  --   --   --   --   --   --   --   --   --   --   --   --   BNP  --   --   --   --   --   --   --  67.8 75.0 86.8 182.0*  --  127.2*  MG 1.6*  --   --   --   --   --  2.1  --  1.8 2.1 1.8  --  1.8  CALCIUM 8.4*  --   --   --  8.9  --  9.0  --  9.6 8.9 9.0  --  8.8*    Lab Results  Component Value Date   HGBA1C 8.0 (H) 07/21/2022    No results for input(s): "TSH", "T4TOTAL", "T3FREE", "THYROIDAB" in the last 72 hours.  Invalid input(s): "FREET3"  Radiology Reports DG Chest Port 1 View  Result Date: 07/25/2022 CLINICAL DATA:  Shortness of breath, left rib fractures. EXAM: PORTABLE CHEST 1 VIEW COMPARISON:  07/21/2022 and CT chest 07/21/2022. FINDINGS: Trachea is midline. Heart size stable. Thoracic aorta is calcified. Lungs are low in volume minimal streaky volume loss in the right lung base. Right hemidiaphragm is elevated. No airspace consolidation or pleural fluid. Left sixth, seventh and eighth rib fractures are noted. IMPRESSION: 1. No acute pulmonary parenchymal findings. 2. Acute left sixth, seventh and eighth rib fractures. Electronically Signed   By: Leanna Battles M.D.   On: 07/25/2022 09:35   CT ABDOMEN PELVIS WO CONTRAST  Result Date: 07/23/2022 CLINICAL DATA:  Abdominal pain EXAM: CT ABDOMEN AND PELVIS WITHOUT CONTRAST TECHNIQUE: Multidetector CT imaging of the abdomen and pelvis was performed following the standard protocol without IV contrast. RADIATION DOSE REDUCTION: This exam was performed according to the departmental dose-optimization program which includes automated exposure control, adjustment of the mA and/or kV according to patient size and/or use of iterative reconstruction technique. COMPARISON:  05/21/2022 FINDINGS: Lower chest: Coronary and descending thoracic aortic atherosclerosis. Stable scarring in the right  lower lobe. Circumferential distal esophageal wall thickening is nonspecific but distal esophagitis would be a common cause for this appearance. Mildly elevated right hemidiaphragm. Hepatobiliary: Diffuse hepatic steatosis. Gallbladder unremarkable. No biliary dilatation. Pancreas: Unremarkable Spleen: Unremarkable Adrenals/Urinary Tract: Both adrenal glands appear normal. Spill to nonobstructive right renal calculi, the largest in the right kidney upper pole measuring 0.7 cm in long axis. Three nonobstructive left renal calculi, the largest measuring 0.6 cm in long axis in the left kidney upper pole. No hydronephrosis or hydroureter. Moderately accentuated density of fluid in the urinary bladder, probably from previous contrast administration. Adrenal glands unremarkable. Stomach/Bowel: Unremarkable.  Normal appendix. Vascular/Lymphatic: Atherosclerosis is present, including aortoiliac atherosclerotic disease. 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 Reproductive: Unremarkable Other: No supplemental non-categorized findings. Musculoskeletal: Acute fractures of the left seventh, eighth, and ninth ribs, with the seventh rib fracture displaced by about 2 mm. These fractures were reported on prior CTA chest of 07/21/2022. Bridging spurring of the right sacroiliac joint. Thoracic and lumbar spondylosis with lower lumbar degenerative disc disease, resulting in bilateral foraminal impingement at L5-S1 and L4-5. IMPRESSION: 1. Bilateral nonobstructive nephrolithiasis. 2. Acute fractures of the left seventh, eighth, and ninth ribs, with the seventh rib fracture displaced by about 2 mm. These fractures were reported on prior CTA chest of 07/21/2022. 3. Circumferential distal esophageal wall thickening is nonspecific but distal esophagitis would be a common cause for this appearance. 4. Diffuse hepatic steatosis. 5. Lower lumbar degenerative  disc disease causing bilateral foraminal impingement at L4-5 and L5-S1. 6. 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 7. Aortic atherosclerosis. Aortic Atherosclerosis (ICD10-I70.0). Electronically Signed   By: Gaylyn Rong M.D.   On: 07/23/2022 12:38   VAS US CAROTID  Result Date: 07/23/2022 Carotid Arterial Duplex Study Patient Name:  Blane Cherie Ouch  Date of Exam:   07/22/2022 Medical Rec #: [id number]  Accession #:    [id number] Date of Birth: 07-Jul-1955          Patient Gender: M Patient Age:   70 years Exam Location:  [facility] Procedure:      VAS US CAROTID Referring Phys: Jonny Ruiz DOUTOVA --------------------------------------------------------------------------------  Indications:       Syncope. Risk Factors:      Hypertension, hyperlipidemia, Diabetes, no history of                    smoking. Limitations        Today's exam was limited due to Calcific shadowing, poor                    ultrasound tissue/interface and the body habitus of the                    patient. Comparison Study:  No previous exams Performing Technologist: Jody Hill RVT, RDMS  Examination Guidelines: A complete evaluation includes B-mode imaging, spectral Doppler, color Doppler, and power Doppler as needed of all accessible portions of each vessel. Bilateral testing is considered an integral part of a complete examination. Limited examinations for reoccurring indications may be performed as noted.  Right Carotid Findings: +----------+-------+--------+--------+----------------------+------------------+           PSV    EDV cm/sStenosisPlaque Description    Comments                     cm/s                                                            +----------+-------+--------+--------+----------------------+------------------+ CCA Prox  59     9               hyperechoic and       intimal thickening                                   calcific                                 +----------+-------+--------+--------+----------------------+------------------+ CCA Distal51     6                                     intimal thickening +----------+-------+--------+--------+----------------------+------------------+ ICA Prox  39     7               calcific              Shadowing          +----------+-------+--------+--------+----------------------+------------------+ ICA Distal53     13              calcific                                 +----------+-------+--------+--------+----------------------+------------------+ ECA       123    9               calcific                                 +----------+-------+--------+--------+----------------------+------------------+ +----------+--------+-------+----------------+-------------------+  PSV cm/sEDV cmsDescribe        Arm Pressure (mmHG) +----------+--------+-------+----------------+-------------------+ [id number]             Multiphasic, WNL                    +----------+--------+-------+----------------+-------------------+ +---------+--------+--+--------+--+---------+ VertebralPSV cm/s54EDV cm/s11Antegrade +---------+--------+--+--------+--+---------+  Left Carotid Findings: +----------+-------+--------+--------+-----------------------+-----------------+           PSV    EDV cm/sStenosisPlaque Description     Comments                    cm/s                                                            +----------+-------+--------+--------+-----------------------+-----------------+ CCA Prox  88     8               calcific               intimal                                                                   thickening        +----------+-------+--------+--------+-----------------------+-----------------+ CCA Distal85     11              heterogenous and       intimal                                             calcific               thickening        +----------+-------+--------+--------+-----------------------+-----------------+ ICA Prox  58     14              calcific               Shadowing         +----------+-------+--------+--------+-----------------------+-----------------+ ICA Distal50     16                                                       +----------+-------+--------+--------+-----------------------+-----------------+ ECA       134                                           shadowing         +----------+-------+--------+--------+-----------------------+-----------------+ +----------+--------+--------+----------------+-------------------+           PSV cm/sEDV cm/sDescribe        Arm Pressure (mmHG) +----------+--------+--------+----------------+-------------------+ [id number]             Multiphasic, WNL                    +----------+--------+--------+----------------+-------------------+ +---------+--------+--+--------+--------------+  VertebralPSV cm/s32EDV cm/sHigh resistant +---------+--------+--+--------+--------------+   Summary: Right Carotid: Velocities in the right ICA are consistent with a 1-39% stenosis. Left Carotid: Velocities in the left ICA are consistent with a 1-39% stenosis.               The extracranial vessels were near-normal with only minimal wall               thickening or plaque. Vertebrals:  Right vertebral artery demonstrates antegrade flow. Left vertebral              artery demonstrates high resistant flow. Subclavians: Normal flow hemodynamics were seen in bilateral subclavian              arteries. *See table(s) above for measurements and observations.  Electronically signed by Gerarda Fraction on 07/23/2022 at 10:34:35 AM.    Final    DG Abd 1 View  Result Date: 07/23/2022 CLINICAL DATA:  Vomiting with nausea EXAM: ABDOMEN - 1 VIEW COMPARISON:  None Available. FINDINGS: The stomach is distended.  Otherwise limited visualization of bowel gas pattern due to body habitus. IMPRESSION: No dilated small bowel visible, but study limited by body habitus. Electronically Signed   By: Deatra Robinson M.D.   On: 07/23/2022 03:24   EEG adult  Result Date: 07/22/2022 Charlsie Quest, MD     07/22/2022  5:01 PM Patient Name: FREDDERICK SWANGER MRN: [id number] Epilepsy Attending: Charlsie Quest Referring Physician/Provider: Leroy Sea, MD Date: 07/22/2022 Duration: 25.06 mins Patient history: 67yo M with syncope. EEG to evaluate for seizure Level of alertness: Awake AEDs during EEG study: None Technical aspects: This EEG study was done with scalp electrodes positioned according to the 10-20 International system of electrode placement. Electrical activity was reviewed with band pass filter of 1-70Hz , sensitivity of 7 uV/mm, display speed of 39mm/sec with a 60Hz  notched filter applied as appropriate. EEG data were recorded continuously and digitally stored.  Video monitoring was available and reviewed as appropriate. Description: The posterior dominant rhythm consists of 9 Hz activity of moderate voltage (25-35 uV) seen predominantly in posterior head regions, symmetric and reactive to eye opening and eye closing. Physiologic photic driving was seen during photic stimulation.  Hyperventilation was not performed.   IMPRESSION: This study is within normal limits. No seizures or epileptiform discharges were seen throughout the recording. A normal interictal EEG does not exclude  the diagnosis of epilepsy.   ECHOCARDIOGRAM COMPLETE  Result Date: 07/22/2022    ECHOCARDIOGRAM REPORT   Patient Name:   Deleon KHAYREE DELELLIS Date of Exam: 07/22/2022 Medical Rec #:  09/20/2022        Height:       70.0 in Accession #:    [id number]       Weight:       248.7 lb Date of Birth:  06-24-1955         BSA:          2.290 m Patient Age:    66 years         BP:           117/68 mmHg Patient Gender: M                HR:            89 bpm. Exam Location:  Inpatient Procedure: 2D Echo and Intracardiac Opacification Agent Indications:    syncope  History:        Patient has no  prior history of Echocardiogram examinations.                 Risk Factors:Hypertension, Diabetes and Dyslipidemia.  Sonographer:    Cathie HoopsWendy Porter Referring Phys: [id number] ANASTASSIA DOUTOVA  Sonographer Comments: Technically difficult study due to poor echo windows, suboptimal apical window, suboptimal parasternal window, suboptimal subcostal window and patient is obese. Image acquisition challenging due to patient body habitus, Image acquisition challenging due to respiratory motion and broken ribs. IMPRESSIONS  1. Windows not well visualized. Cannot assess wall motion. Left ventricular ejection fraction, by estimation, is 60 to 65%. The left ventricle has normal function.  2. Right ventricular systolic function is normal. The right ventricular size is normal.  3. No evidence of mitral valve regurgitation.  4. Aortic valve regurgitation is not visualized.  5. The inferior vena cava is normal in size with greater than 50% respiratory variability, suggesting right atrial pressure of 3 mmHg. FINDINGS  Left Ventricle: Windows not well visualized. Cannot assess wall motion. Left ventricular ejection fraction, by estimation, is 60 to 65%. The left ventricle has normal function. Definity contrast agent was given IV to delineate the left ventricular endocardial borders. The left ventricular internal cavity size was normal in size. Right Ventricle: The right ventricular size is normal. Right ventricular systolic function is normal. Left Atrium: Left atrial size was not well visualized. Right Atrium: Right atrial size was not well visualized. Pericardium: The pericardium was not well visualized. Mitral Valve: No evidence of mitral valve regurgitation. Tricuspid Valve: Tricuspid valve regurgitation is not demonstrated. Aortic Valve: Aortic valve regurgitation is not visualized. Aortic  valve mean gradient measures 3.0 mmHg. Aortic valve peak gradient measures 6.0 mmHg. Aortic valve area, by VTI measures 2.25 cm. Pulmonic Valve: Pulmonic valve regurgitation is not visualized. Aorta: The aortic root is normal in size and structure. Venous: The inferior vena cava is normal in size with greater than 50% respiratory variability, suggesting right atrial pressure of 3 mmHg. IAS/Shunts: The interatrial septum was not well visualized.  LEFT VENTRICLE PLAX 2D LVIDd:         4.20 cm     Diastology LVIDs:         2.80 cm     LV e' medial:    7.29 cm/s LV PW:         1.00 cm     LV E/e' medial:  7.6 LV IVS:        0.90 cm     LV e' lateral:   8.05 cm/s LVOT diam:     2.30 cm     LV E/e' lateral: 6.9 LV SV:         51 LV SV Index:   22 LVOT Area:     4.15 cm  LV Volumes (MOD) LV vol d, MOD A2C: 75.4 ml LV vol d, MOD A4C: 92.6 ml LV vol s, MOD A2C: 25.3 ml LV vol s, MOD A4C: 28.8 ml LV SV MOD A2C:     50.1 ml LV SV MOD A4C:     92.6 ml LV SV MOD BP:      59.1 ml RIGHT VENTRICLE RV S prime:     16.00 cm/s TAPSE (M-mode): 2.0 cm LEFT ATRIUM           Index LA diam:      3.70 cm 1.62 cm/m LA Vol (A4C): 58.6 ml 25.59 ml/m  AORTIC VALVE  PULMONIC VALVE AV Area (Vmax):    2.14 cm     PV Vmax:       0.99 m/s AV Area (Vmean):   2.10 cm     PV Peak grad:  3.9 mmHg AV Area (VTI):     2.25 cm AV Vmax:           122.00 cm/s AV Vmean:          81.900 cm/s AV VTI:            0.227 m AV Peak Grad:      6.0 mmHg AV Mean Grad:      3.0 mmHg LVOT Vmax:         62.70 cm/s LVOT Vmean:        41.400 cm/s LVOT VTI:          0.123 m LVOT/AV VTI ratio: 0.54  AORTA Ao Root diam: 3.50 cm MITRAL VALVE MV Area (PHT): 3.57 cm    SHUNTS MV Decel Time: 213 msec    Systemic VTI:  0.12 m MV E velocity: 55.25 cm/s  Systemic Diam: 2.30 cm MV A velocity: 51.45 cm/s MV E/A ratio:  1.07 Mary Scientist, physiological signed by Phineas Inches Signature Date/Time: 07/22/2022/9:56:01 AM    Final       Signature  -   Lala Lund  M.D on 07/26/2022 at 8:29 AM   -  To page go to [email or web address]

## 2022-07-26 NOTE — Care Management Important Message (Signed)
Important Message  Patient Details  Name: Alan Lin MRN: [id number] Date of Birth: 1955-09-29   Medicare Important Message Given:  Yes     Orbie Pyo 07/26/2022, 3:10 PM

## 2022-07-26 NOTE — Progress Notes (Signed)
Heparin gtt rate verified with night shift Airline pilot.

## 2022-07-26 NOTE — Progress Notes (Addendum)
ANTICOAGULATION CONSULT NOTE  Pharmacy Consult for Heparin Indication: atrial fibrillation  Allergies  Allergen Reactions   Metformin Diarrhea and Nausea And Vomiting   Oxycodone Other (See Comments)    Constipation   Oxycodone-Acetaminophen     Constipation   Lisinopril Cough   Morphine Itching    Other reaction(s): Other (See Comments)  Constipation   Morphine And Related Itching    Patient Measurements: Height: 5\' 10"  (177.8 cm) Weight: 112.8 kg (248 lb 10.9 oz) IBW/kg (Calculated) : 73 Heparin Dosing Weight: 100  Vital Signs: Temp: 98 F (36.7 C) (02/06 0348) Temp Source: Oral (02/06 0348) BP: 142/75 (02/06 0348) Pulse Rate: 77 (02/06 0348)  Labs: Recent Labs    07/24/22 0308 07/25/22 0214 07/25/22 1810 07/25/22 1824 07/26/22 0506  HGB 14.7 14.8  --   --  14.5  HCT 42.5 44.6  --   --  41.6  PLT 269 273  --   --  246  LABPROT  --   --   --  13.7  --   INR  --   --   --  1.1  --   HEPARINUNFRC  --   --  0.16*  --  0.47  CREATININE 0.74 0.79  --   --  0.88     Estimated Creatinine Clearance: 103.8 mL/min (by C-G formula based on SCr of 0.88 mg/dL).  Assessment: 67 year old male to begin heparin for Afib. No AC PTA.   Heparin level came back therapeutic.   Goal of Therapy:  Heparin level 0.3-0.7 units/ml Monitor platelets by anticoagulation protocol: Yes   Plan:  Cont heparin infusion 1700 units / hr Daily heparin level, CBC  Thank you Anette Guarneri, PharmD

## 2022-07-26 NOTE — Progress Notes (Signed)
Pt in Endo for TEE/Cardioversion. Pt in NSR, 12 lead EKG verified. Dr. Lynita Lombard aware and procedure cancelled. Report given to floor RN. Jobe Igo, RN

## 2022-07-26 NOTE — TOC Progression Note (Addendum)
Transition of Care [facility]) - Progression Note    Patient Details  Name: Alan Lin MRN: [id number] Date of Birth: 12-02-1955  [facility]) CM/SW [place], LCSW Phone Number: 07/26/2022, 9:18 AM  Clinical Narrative:    9:18am-CSW spoke with patient's brother and emailed the SNF bed offers and Medicare ratings list as requested to [email or web address] for review. He will follow up with CSW on choice.  4pm-CSW received response from patient's brother that they have selected [facility]. CSW met with patient and he confirmed. Per [facility], they should have a bed opening on Thursday. CSW will continue to follow for medical stability.    Expected Discharge Plan: [place] Barriers to Discharge: Continued Medical Work up, SNF Pending bed offer  Expected Discharge Plan and Services In-house Referral: Clinical Social Work   Post Acute Care Choice: [place] Living arrangements for the past 2 months: Single Family Home                                       Social Determinants of Health (SDOH) Interventions SDOH Screenings   Food Insecurity: No Food Insecurity (07/21/2022)  Housing: Low Risk  (07/21/2022)  Transportation Needs: No Transportation Needs (07/21/2022)  Utilities: Not At Risk (07/21/2022)  Tobacco Use: Low Risk  (07/21/2022)    Readmission Risk Interventions     No data to display

## 2022-07-26 NOTE — Inpatient Diabetes Management (Signed)
Inpatient Diabetes Program Recommendations  AACE/ADA: New Consensus Statement on Inpatient Glycemic Control (2015)  Target Ranges:  Prepandial:   less than 140 mg/dL      Peak postprandial:   less than 180 mg/dL (1-2 hours)      Critically ill patients:  140 - 180 mg/dL   Lab Results  Component Value Date   GLUCAP 185 (H) 07/26/2022   HGBA1C 8.0 (H) 07/21/2022    Review of Glycemic Control  Latest Reference Range & Units 07/25/22 11:49 07/25/22 17:29 07/25/22 23:02 07/26/22 03:44  Glucose-Capillary 70 - 99 mg/dL 264 (H) 221 (H) 198 (H) 185 (H)  (H): Data is abnormally high Diabetes history: Type 2 DM Outpatient Diabetes medications: none Current orders for Inpatient glycemic control: Novolog 0-9 units Q4H  Inpatient Diabetes Program Recommendations:    Consider also adding portion of basal as glucose trends exceed inpatient goals: Semglee 10 units QD.  Thanks, Bronson Curb, MSN, RNC-OB Diabetes Coordinator [phone] (8a-5p)

## 2022-07-26 NOTE — Progress Notes (Signed)
Mobility Specialist Progress Note   07/26/22 1530  Mobility  Activity Ambulated with assistance in hallway  Level of Assistance Moderate assist, patient does 50-74%  Assistive Device Front wheel walker (+chair for safety)  Distance Ambulated (ft) 36 ft  Activity Response Tolerated well  Mobility Referral Yes  $Mobility charge 1 Mobility   Pre Mobility: 68 HR During Mobility: 98 HR Post Mobility: 79 HR  Pt eager for mobility having no complaints. ModA to get trunk to EOB, restricted by pain in L ribs. ModA to stand and stabilize d/t posterior lean but CGA during ambulation. X1 seated rest break for energy conservation, returned back to EOB w/o fault. Pt requesting to do x5 STS before laying down. [place] on initial stand but as each trial progressed less assistance was needed. No incidents during STS, placed back supine w/ call bell in reach and all needs met.  [place] Falling Mobility Specialist Please contact via SecureChat or  Rehab office at [phone]

## 2022-07-26 NOTE — Telephone Encounter (Signed)
Patient is still currently admitted in the hospital.

## 2022-07-26 NOTE — Progress Notes (Signed)
Physical Therapy Treatment Patient Details Name: Alan Lin MRN: [id number] DOB: May 14, 1956 Today's Date: 07/26/2022   History of Present Illness 67 y/o M admitted on 2/1 with falls and loss of consciousness. Pt had 3 episodes of syncope, imaging reveals fractures of L 6, 7, 8 ribs. PMHx: DM, HTN, falls, unsteady gait followed by neurology.    PT Comments    Pt tolerated today's session well, progressing to minA for bed mobility, with minA for sit<>stand transfers with RW, able to ambulate ~15 feet in the room today with min guard and RW, as well as increased time. Pt limited with ambulation due to pain and also reports of feeling nervous as this is the farthest he has ambulating since admission. Pt will continue to benefit from skilled acute PT to continue to progress mobility and address deficits, discharge plan remains appropriate.     Recommendations for follow up therapy are one component of a multi-disciplinary discharge planning process, led by the attending physician.  Recommendations may be updated based on patient status, additional functional criteria and insurance authorization.  Follow Up Recommendations  Skilled nursing-short term rehab (<3 hours/day) Can patient physically be transported by private vehicle: No   Assistance Recommended at Discharge Frequent or constant Supervision/Assistance  Patient can return home with the following A little help with walking and/or transfers;Assist for transportation;Help with stairs or ramp for entrance;Assistance with cooking/housework   Equipment Recommendations  None recommended by PT    Recommendations for Other Services       Precautions / Restrictions Precautions Precautions: Fall Precaution Comments: L rib fx's Restrictions Weight Bearing Restrictions: No     Mobility  Bed Mobility Overal bed mobility: Needs Assistance Bed Mobility: Supine to Sit     Supine to sit: HOB elevated, Min assist Sit to supine: Min  assist   General bed mobility comments: use of bedrails, assist required for technique and sequencing. For supine>sit trunk support required, BLE management for sit>supine.    Transfers Overall transfer level: Needs assistance Equipment used: Rolling walker (2 wheels) Transfers: Sit to/from Stand Sit to Stand: Min assist           General transfer comment: minA for sit<>stand today. Cueing provided for proper hand placement as well as scooting to EOB prior to attempting to stand    Ambulation/Gait Ambulation/Gait assistance: Min guard Gait Distance (Feet): 15 Feet Assistive device: Rolling walker (2 wheels) Gait Pattern/deviations: Step-through pattern, Decreased stride length, Shuffle, Trunk flexed Gait velocity: decreased     General Gait Details: Pt wtih downward gaze and decreased B step length and foot clearance, limited by pain and reports of nervousness   Stairs             Wheelchair Mobility    Modified Rankin (Stroke Patients Only)       Balance Overall balance assessment: Needs assistance Sitting-balance support: Feet supported, Single extremity supported Sitting balance-Leahy Scale: Fair Sitting balance - Comments: UE support with sitting balance, supervision for safety, no sway noted   Standing balance support: Reliant on assistive device for balance, During functional activity Standing balance-Leahy Scale: Poor Standing balance comment: Pt utilizing RW for standing balance, min guard while in standing provided for safety. Reliant on RW with ambualtion, no LOB noted today                            Cognition Arousal/Alertness: Awake/alert Behavior During Therapy: WFL for tasks assessed/performed Overall Cognitive Status:  Within Functional Limits for tasks assessed                                 General Comments: Cooperative throughout session        Exercises      General Comments General comments (skin  integrity, edema, etc.): VSS on room air      Pertinent Vitals/Pain Pain Assessment Pain Assessment: Faces Faces Pain Scale: Hurts little more Pain Location: ribs Pain Descriptors / Indicators: Sharp (with movement) Pain Intervention(s): Repositioned, Limited activity within patient's tolerance, Monitored during session    Home Living                          Prior Function            PT Goals (current goals can now be found in the care plan section) Acute Rehab PT Goals Patient Stated Goal: get better PT Goal Formulation: With patient Time For Goal Achievement: 08/05/22 Potential to Achieve Goals: Good Progress towards PT goals: Progressing toward goals    Frequency    Min 3X/week      PT Plan Current plan remains appropriate    Co-evaluation              AM-PAC PT "6 Clicks" Mobility   Outcome Measure  Help needed turning from your back to your side while in a flat bed without using bedrails?: A Little Help needed moving from lying on your back to sitting on the side of a flat bed without using bedrails?: A Little Help needed moving to and from a bed to a chair (including a wheelchair)?: A Little Help needed standing up from a chair using your arms (e.g., wheelchair or bedside chair)?: A Little Help needed to walk in hospital room?: A Little Help needed climbing 3-5 steps with a railing? : A Lot 6 Click Score: 17    End of Session   Activity Tolerance: Patient tolerated treatment well Patient left: in bed;with call bell/phone within reach Nurse Communication: Mobility status PT Visit Diagnosis: Repeated falls (R29.6);Difficulty in walking, not elsewhere classified (R26.2)     Time: 2263-3354 PT Time Calculation (min) (ACUTE ONLY): 17 min  Charges:  $Gait Training: 8-22 mins                     Charlynne Cousins, PT DPT Acute Rehabilitation Services Office [phone]    Luvenia Heller 07/26/2022, 1:27 PM

## 2022-07-26 NOTE — Progress Notes (Signed)
Subjective:  Patient seen and examined at bedside, resting comfortably. No events overnight. He was supposed to have TEE with DCCV with me today however in endoscopy patient was found to be in NSR. Procedure has been cancelled.   Intake/Output from previous day:  I/O last 3 completed shifts: In: 480 [P.O.:480] Out: 1700 [Urine:1700] Total I/O In: -  Out: 500 [Urine:500] Net IO Since Admission: -5,357.88 mL [07/26/22 1213]  Blood pressure (!) 158/94, pulse 75, temperature (!) 97.4 F (36.3 C), temperature source Temporal, resp. rate 14, height 5\' 10"  (1.778 m), weight 112.8 kg, SpO2 94 %. Physical Exam Vitals reviewed.  HENT:     Head: Normocephalic and atraumatic.  Cardiovascular:     Rate and Rhythm: Normal rate and regular rhythm.     Heart sounds: Normal heart sounds. No murmur heard. Pulmonary:     Effort: Pulmonary effort is normal.  Abdominal:     General: Bowel sounds are normal.  Musculoskeletal:     Right lower leg: No edema.     Left lower leg: No edema.  Skin:    General: Skin is warm and dry.  Neurological:     Mental Status: He is alert.     Lab Results: Lab Results  Component Value Date   NA 130 (L) 07/26/2022   K 3.4 (L) 07/26/2022   CO2 28 07/26/2022   GLUCOSE 186 (H) 07/26/2022   BUN 14 07/26/2022   CREATININE 0.88 07/26/2022   CALCIUM 8.8 (L) 07/26/2022   GFRNONAA >60 07/26/2022    BNP (last 3 results) Recent Labs    07/24/22 0308 07/25/22 0214 07/26/22 0506  BNP 86.8 182.0* 127.2*    ProBNP (last 3 results) No results for input(s): "PROBNP" in the last 8760 hours.    Latest Ref Rng & Units 07/26/2022    5:06 AM 07/25/2022    2:14 AM 07/24/2022    3:08 AM  BMP  Glucose 70 - 99 mg/dL 186  162  188   BUN 8 - 23 mg/dL 14  13  16    Creatinine 0.61 - 1.24 mg/dL 0.88  0.79  0.74   Sodium 135 - 145 mmol/L 130  131  133   Potassium 3.5 - 5.1 mmol/L 3.4  3.5  3.7   Chloride 98 - 111 mmol/L 93  96  100   CO2 22 - 32 mmol/L 28  26  25    Calcium  8.9 - 10.3 mg/dL 8.8  9.0  8.9       Latest Ref Rng & Units 07/26/2022    5:06 AM 07/25/2022    2:14 AM 07/24/2022    3:08 AM  Hepatic Function  Total Protein 6.5 - 8.1 g/dL 5.1  5.5  5.6   Albumin 3.5 - 5.0 g/dL 2.7  3.0  3.1   AST 15 - 41 U/L 13  17  14    ALT 0 - 44 U/L 24  26  28    Alk Phosphatase 38 - 126 U/L 48  48  48   Total Bilirubin 0.3 - 1.2 mg/dL 1.0  1.0  1.2       Latest Ref Rng & Units 07/26/2022    5:06 AM 07/25/2022    2:14 AM 07/24/2022    3:08 AM  CBC  WBC 4.0 - 10.5 K/uL 11.6  13.9  14.7   Hemoglobin 13.0 - 17.0 g/dL 14.5  14.8  14.7   Hematocrit 39.0 - 52.0 % 41.6  44.6  42.5   Platelets  150 - 400 K/uL 246  273  269    Lipid Panel  No results found for: "CHOL", "TRIG", "HDL", "CHOLHDL", "VLDL", "LDLCALC", "LDLDIRECT" Cardiac Panel (last 3 results) No results for input(s): "CKTOTAL", "CKMB", "TROPONINI", "RELINDX" in the last 72 hours.  HEMOGLOBIN A1C Lab Results  Component Value Date   HGBA1C 8.0 (H) 07/21/2022   MPG 182.9 07/21/2022   TSH Recent Labs    07/21/22 1907  TSH 0.453   Imaging: Imaging results have been reviewed and DG Chest Port 1 View  Result Date: 07/25/2022 CLINICAL DATA:  Shortness of breath, left rib fractures. EXAM: PORTABLE CHEST 1 VIEW COMPARISON:  07/21/2022 and CT chest 07/21/2022. FINDINGS: Trachea is midline. Heart size stable. Thoracic aorta is calcified. Lungs are low in volume minimal streaky volume loss in the right lung base. Right hemidiaphragm is elevated. No airspace consolidation or pleural fluid. Left sixth, seventh and eighth rib fractures are noted. IMPRESSION: 1. No acute pulmonary parenchymal findings. 2. Acute left sixth, seventh and eighth rib fractures. Electronically Signed   By: Leanna Battles M.D.   On: 07/25/2022 09:35    Cardiac Studies:  EKG:  07/25/2022: Atrial fibrillation with rapid ventricular response. ST & T wave abnormality, consider inferolateral ischemia. Abnormal ECG. When compared with ECG of  25-Jul-2022 09:04, sinus tachcyardia is changed to Afib with RVR   07/26/2022: normal sinus rhythm verified in endo. TEE with DCCV cancelled   Recent Results (from the past 58850 hour(s))  ECHOCARDIOGRAM COMPLETE   Collection Time: 07/22/22  9:45 AM  Result Value   Weight 3,978.86   Height 70   BP 117/68   Single Plane A2C EF 66.4   Single Plane A4C EF 68.9   Calc EF 69.0   S' Lateral 2.80   AR max vel 2.14   AV Area VTI 2.25   AV Mean grad 3.0   AV Peak grad 6.0   Ao pk vel 1.22   Area-P 1/2 3.57   AV Area mean vel 2.10   Est EF 60 - 65%   Narrative      ECHOCARDIOGRAM REPORT       Patient Name:   [name] Date of Exam: 07/22/2022 Medical Rec #:  [id number]        Height:       70.0 in Accession #:    [id number]       Weight:       248.7 lb Date of Birth:  [birth year]         BSA:          2.290 m Patient Age:    67 years         BP:           117/68 mmHg Patient Gender: M                HR:           89 bpm. Exam Location:  Inpatient  Procedure: 2D Echo and Intracardiac Opacification Agent  Indications:    syncope   History:        Patient has no prior history of Echocardiogram examinations.                 Risk Factors:Hypertension, Diabetes and Dyslipidemia.   Sonographer:    Cathie Hoops Referring Phys: [id number] ANASTASSIA DOUTOVA    Sonographer Comments: Technically difficult study due to poor echo windows, suboptimal apical window, suboptimal parasternal window, suboptimal subcostal window and patient is  obese. Image acquisition challenging due to patient body habitus, Image  acquisition challenging due to respiratory motion and broken ribs. IMPRESSIONS    1. Windows not well visualized. Cannot assess wall motion. Left ventricular ejection fraction, by estimation, is 60 to 65%. The left ventricle has normal function.  2. Right ventricular systolic function is normal. The right ventricular size is normal.  3. No evidence of mitral valve regurgitation.  4.  Aortic valve regurgitation is not visualized.  5. The inferior vena cava is normal in size with greater than 50% respiratory variability, suggesting right atrial pressure of 3 mmHg.  FINDINGS  Left Ventricle: Windows not well visualized. Cannot assess wall motion. Left ventricular ejection fraction, by estimation, is 60 to 65%. The left ventricle has normal function. Definity contrast agent was given IV to delineate the left ventricular  endocardial borders. The left ventricular internal cavity size was normal in size.  Right Ventricle: The right ventricular size is normal. Right ventricular systolic function is normal.  Left Atrium: Left atrial size was not well visualized.  Right Atrium: Right atrial size was not well visualized.  Pericardium: The pericardium was not well visualized.  Mitral Valve: No evidence of mitral valve regurgitation.  Tricuspid Valve: Tricuspid valve regurgitation is not demonstrated.  Aortic Valve: Aortic valve regurgitation is not visualized. Aortic valve mean gradient measures 3.0 mmHg. Aortic valve peak gradient measures 6.0 mmHg. Aortic valve area, by VTI measures 2.25 cm.  Pulmonic Valve: Pulmonic valve regurgitation is not visualized.  Aorta: The aortic root is normal in size and structure.  Venous: The inferior vena cava is normal in size with greater than 50% respiratory variability, suggesting right atrial pressure of 3 mmHg.  IAS/Shunts: The interatrial septum was not well visualized.    LEFT VENTRICLE PLAX 2D LVIDd:         4.20 cm     Diastology LVIDs:         2.80 cm     LV e' medial:    7.29 cm/s LV PW:         1.00 cm     LV E/e' medial:  7.6 LV IVS:        0.90 cm     LV e' lateral:   8.05 cm/s LVOT diam:     2.30 cm     LV E/e' lateral: 6.9 LV SV:         51 LV SV Index:   22 LVOT Area:     4.15 cm   LV Volumes (MOD) LV vol d, MOD A2C: 75.4 ml LV vol d, MOD A4C: 92.6 ml LV vol s, MOD A2C: 25.3 ml LV vol s, MOD A4C: 28.8 ml LV  SV MOD A2C:     50.1 ml LV SV MOD A4C:     92.6 ml LV SV MOD BP:      59.1 ml  RIGHT VENTRICLE RV S prime:     16.00 cm/s TAPSE (M-mode): 2.0 cm  LEFT ATRIUM           Index LA diam:      3.70 cm 1.62 cm/m LA Vol (A4C): 58.6 ml 25.59 ml/m  AORTIC VALVE                    PULMONIC VALVE AV Area (Vmax):    2.14 cm     PV Vmax:       0.99 m/s AV Area (Vmean):   2.10 cm  PV Peak grad:  3.9 mmHg AV Area (VTI):     2.25 cm AV Vmax:           122.00 cm/s AV Vmean:          81.900 cm/s AV VTI:            0.227 m AV Peak Grad:      6.0 mmHg AV Mean Grad:      3.0 mmHg LVOT Vmax:         62.70 cm/s LVOT Vmean:        41.400 cm/s LVOT VTI:          0.123 m LVOT/AV VTI ratio: 0.54   AORTA Ao Root diam: 3.50 cm  MITRAL VALVE MV Area (PHT): 3.57 cm    SHUNTS MV Decel Time: 213 msec    Systemic VTI:  0.12 m MV E velocity: 55.25 cm/s  Systemic Diam: 2.30 cm MV A velocity: 51.45 cm/s MV E/A ratio:  1.07  Landscape architect signed by Phineas Inches Signature Date/Time: 07/22/2022/9:56:01 AM       Final     *Note: Due to a large number of results and/or encounters for the requested time period, some results have not been displayed. A complete set of results can be found in Results Review.    Scheduled Meds:  DULoxetine  40 mg Oral Daily   fentaNYL  1 patch Transdermal Q72H   gabapentin  300 mg Oral Daily   insulin aspart  0-9 Units Subcutaneous Q4H   metoCLOPramide (REGLAN) injection  5 mg Intravenous Q8H   metoprolol succinate  50 mg Oral BID   pantoprazole  40 mg Oral BID   senna  2 tablet Oral Daily   simvastatin  20 mg Oral Daily   Continuous Infusions:  sodium chloride 100 mL/hr at 07/26/22 3536   amiodarone 30 mg/hr (07/25/22 2329)   heparin 1,700 Units/hr (07/25/22 2331)   PRN Meds:.acetaminophen **OR** acetaminophen, diltiazem, HYDROcodone-acetaminophen, HYDROmorphone (DILAUDID) injection, metoprolol tartrate, ondansetron (ZOFRAN) IV,  traMADol  Assessment & Plan: Alan Lin is a 67 y.o. male patient with syncope, new onset Afib, and diabetes  New onset Afib RVR, currently CVR CHA2DS2VASc = at least 3 Echocardiogram reviewed. Avoid Qtc prolonging drugs. Mag 4 grams IV ordered.  Maintain K>4 and Mag>2. Continue Toprol XL 50mg  BID. Currently on heparin and amiodarone gtt.  Transition to PO amio once drips completed: 400 mg BID for 1 week, then 200 mg BID for 1 week, then 200 mg qDay thereafter. Transition to Eliquis 5 mg BID before d/c. Will obtain stress test in outpatient setting given rib fractures. Recommend rehab prior to d/c home.  CANCELLED TEE with DCCV 07/26/2022 as pt is in NSR.     Floydene Flock, DO, [facility] 07/26/2022, 12:14 PM Office: [phone] Fax: [phone] Pager: [phone]

## 2022-07-27 DIAGNOSIS — I480 Paroxysmal atrial fibrillation: Secondary | ICD-10-CM | POA: Diagnosis not present

## 2022-07-27 DIAGNOSIS — R55 Syncope and collapse: Secondary | ICD-10-CM | POA: Diagnosis not present

## 2022-07-27 DIAGNOSIS — I10 Essential (primary) hypertension: Secondary | ICD-10-CM | POA: Diagnosis not present

## 2022-07-27 LAB — CBC WITH DIFFERENTIAL/PLATELET
Abs Immature Granulocytes: 0.21 10*3/uL — ABNORMAL HIGH (ref 0.00–0.07)
Basophils Absolute: 0.1 10*3/uL (ref 0.0–0.1)
Basophils Relative: 1 %
Eosinophils Absolute: 0.1 10*3/uL (ref 0.0–0.5)
Eosinophils Relative: 1 %
HCT: 41.4 % (ref 39.0–52.0)
Hemoglobin: 14.5 g/dL (ref 13.0–17.0)
Immature Granulocytes: 2 %
Lymphocytes Relative: 15 %
Lymphs Abs: 1.6 10*3/uL (ref 0.7–4.0)
MCH: 28.8 pg (ref 26.0–34.0)
MCHC: 35 g/dL (ref 30.0–36.0)
MCV: 82.1 fL (ref 80.0–100.0)
Monocytes Absolute: 1 10*3/uL (ref 0.1–1.0)
Monocytes Relative: 9 %
Neutro Abs: 7.2 10*3/uL (ref 1.7–7.7)
Neutrophils Relative %: 72 %
Platelets: 264 10*3/uL (ref 150–400)
RBC: 5.04 MIL/uL (ref 4.22–5.81)
RDW: 12.8 % (ref 11.5–15.5)
WBC: 10.1 10*3/uL (ref 4.0–10.5)
nRBC: 0 % (ref 0.0–0.2)

## 2022-07-27 LAB — COMPREHENSIVE METABOLIC PANEL
ALT: 22 U/L (ref 0–44)
AST: 16 U/L (ref 15–41)
Albumin: 2.7 g/dL — ABNORMAL LOW (ref 3.5–5.0)
Alkaline Phosphatase: 45 U/L (ref 38–126)
Anion gap: 10 (ref 5–15)
BUN: 10 mg/dL (ref 8–23)
CO2: 26 mmol/L (ref 22–32)
Calcium: 8.6 mg/dL — ABNORMAL LOW (ref 8.9–10.3)
Chloride: 97 mmol/L — ABNORMAL LOW (ref 98–111)
Creatinine, Ser: 0.77 mg/dL (ref 0.61–1.24)
GFR, Estimated: >60 mL/min (ref >60)
Glucose, Bld: 163 mg/dL — ABNORMAL HIGH (ref 70–99)
Potassium: 3.1 mmol/L — ABNORMAL LOW (ref 3.5–5.1)
Sodium: 133 mmol/L — ABNORMAL LOW (ref 135–145)
Total Bilirubin: 1 mg/dL (ref 0.3–1.2)
Total Protein: 5.2 g/dL — ABNORMAL LOW (ref 6.5–8.1)

## 2022-07-27 LAB — GLUCOSE, CAPILLARY
Glucose-Capillary: 139 mg/dL — ABNORMAL HIGH (ref 70–99)
Glucose-Capillary: 176 mg/dL — ABNORMAL HIGH (ref 70–99)
Glucose-Capillary: 243 mg/dL — ABNORMAL HIGH (ref 70–99)
Glucose-Capillary: 195 mg/dL — ABNORMAL HIGH (ref 70–99)
Glucose-Capillary: 226 mg/dL — ABNORMAL HIGH (ref 70–99)

## 2022-07-27 LAB — UREA NITROGEN, URINE: Urea Nitrogen, Ur: 727 mg/dL

## 2022-07-27 LAB — HEPARIN LEVEL (UNFRACTIONATED): Heparin Unfractionated: <0.1 IU/mL — ABNORMAL LOW (ref 0.30–0.70)

## 2022-07-27 MED ORDER — CIPROFLOXACIN HCL 0.3 % OP SOLN
2.0000 [drp] | OPHTHALMIC | Status: DC
  Administered 2022-07-27 – 2022-07-28 (×4): 2 [drp] via OPHTHALMIC
  Filled 2022-07-27: qty 2.5

## 2022-07-27 MED ORDER — POTASSIUM CHLORIDE CRYS ER 20 MEQ PO TBCR
40.0000 meq | EXTENDED_RELEASE_TABLET | ORAL | Status: AC
  Administered 2022-07-27 (×2): 40 meq via ORAL
  Filled 2022-07-27 (×2): qty 2

## 2022-07-27 MED ORDER — APIXABAN 5 MG PO TABS
5.0000 mg | ORAL_TABLET | Freq: Two times a day (BID) | ORAL | Status: DC
  Administered 2022-07-27 – 2022-07-28 (×3): 5 mg via ORAL
  Filled 2022-07-27 (×3): qty 1

## 2022-07-27 MED ORDER — METOCLOPRAMIDE HCL 10 MG PO TABS
5.0000 mg | ORAL_TABLET | Freq: Three times a day (TID) | ORAL | Status: DC
  Administered 2022-07-27 – 2022-07-28 (×2): 5 mg via ORAL
  Filled 2022-07-27 (×2): qty 1

## 2022-07-27 NOTE — Progress Notes (Signed)
Pt is alert and oriented times 4, noted. Pt continues to remove telemetry leads, writer continue to put the telemetry leads in place times 4. Pt refuse to stay on the telemetry monitor at 0320, noted. Pt stated he is to hot for the leads and gown, noted.

## 2022-07-27 NOTE — Progress Notes (Signed)
Home CPAP Compliant

## 2022-07-27 NOTE — TOC Progression Note (Signed)
Transition of Care [facility]) - Progression Note    Patient Details  Name: Alan Lin MRN: [id number] Date of Birth: 11-24-1955  [facility] CM/SW [place], LCSW Phone Number: 07/27/2022, 2:51 PM  Clinical Narrative:    [facility] able to accept patient tomorrow.    Expected Discharge Plan: [place] Barriers to Discharge: Continued Medical Work up, SNF Pending bed offer  Expected Discharge Plan and Services In-house Referral: Clinical Social Work   Post Acute Care Choice: [place] Living arrangements for the past 2 months: Single Family Home                                       Social Determinants of Health (SDOH) Interventions SDOH Screenings   Food Insecurity: No Food Insecurity (07/21/2022)  Housing: Low Risk  (07/21/2022)  Transportation Needs: No Transportation Needs (07/21/2022)  Utilities: Not At Risk (07/21/2022)  Tobacco Use: Low Risk  (07/26/2022)    Readmission Risk Interventions     No data to display

## 2022-07-27 NOTE — Progress Notes (Signed)
Subjective:  Patient seen and examined at bedside, resting comfortably. No events overnight. He states that he feels great and is ready to go to rehab. He will be leaving today or tomorrow depending on when there is space.   Intake/Output from previous day:  I/O last 3 completed shifts: In: -  Out: 2200 [Urine:2200] No intake/output data recorded. Net IO Since Admission: -6,357.88 mL [07/27/22 0904]  Blood pressure (!) 155/90, pulse 68, temperature 97.9 F (36.6 C), temperature source Oral, resp. rate 15, height 5\' 10"  (1.778 m), weight 112.8 kg, SpO2 94 %. Physical Exam Vitals reviewed.  HENT:     Head: Normocephalic and atraumatic.  Cardiovascular:     Rate and Rhythm: Normal rate and regular rhythm.     Heart sounds: Normal heart sounds. No murmur heard. Pulmonary:     Effort: Pulmonary effort is normal.  Abdominal:     General: Bowel sounds are normal.  Musculoskeletal:     Right lower leg: No edema.     Left lower leg: No edema.  Skin:    General: Skin is warm and dry.  Neurological:     Mental Status: He is alert.   Lab Results: Lab Results  Component Value Date   NA 133 (L) 07/27/2022   K 3.1 (L) 07/27/2022   CO2 26 07/27/2022   GLUCOSE 163 (H) 07/27/2022   BUN 10 07/27/2022   CREATININE 0.77 07/27/2022   CALCIUM 8.6 (L) 07/27/2022   GFRNONAA >60 07/27/2022    BNP (last 3 results) Recent Labs    07/24/22 0308 07/25/22 0214 07/26/22 0506  BNP 86.8 182.0* 127.2*    ProBNP (last 3 results) No results for input(s): "PROBNP" in the last 8760 hours.    Latest Ref Rng & Units 07/27/2022    6:14 AM 07/26/2022    5:06 AM 07/25/2022    2:14 AM  BMP  Glucose 70 - 99 mg/dL 163  186  162   BUN 8 - 23 mg/dL 10  14  13    Creatinine 0.61 - 1.24 mg/dL 0.77  0.88  0.79   Sodium 135 - 145 mmol/L 133  130  131   Potassium 3.5 - 5.1 mmol/L 3.1  3.4  3.5   Chloride 98 - 111 mmol/L 97  93  96   CO2 22 - 32 mmol/L 26  28  26    Calcium 8.9 - 10.3 mg/dL 8.6  8.8  9.0        Latest Ref Rng & Units 07/27/2022    6:14 AM 07/26/2022    5:06 AM 07/25/2022    2:14 AM  Hepatic Function  Total Protein 6.5 - 8.1 g/dL 5.2  5.1  5.5   Albumin 3.5 - 5.0 g/dL 2.7  2.7  3.0   AST 15 - 41 U/L 16  13  17    ALT 0 - 44 U/L 22  24  26    Alk Phosphatase 38 - 126 U/L 45  48  48   Total Bilirubin 0.3 - 1.2 mg/dL 1.0  1.0  1.0       Latest Ref Rng & Units 07/27/2022    6:14 AM 07/26/2022    5:06 AM 07/25/2022    2:14 AM  CBC  WBC 4.0 - 10.5 K/uL 10.1  11.6  13.9   Hemoglobin 13.0 - 17.0 g/dL 14.5  14.5  14.8   Hematocrit 39.0 - 52.0 % 41.4  41.6  44.6   Platelets 150 - 400 K/uL 264  246  273    Lipid Panel  No results found for: "CHOL", "TRIG", "HDL", "CHOLHDL", "VLDL", "LDLCALC", "LDLDIRECT" Cardiac Panel (last 3 results) No results for input(s): "CKTOTAL", "CKMB", "TROPONINI", "RELINDX" in the last 72 hours.  HEMOGLOBIN A1C Lab Results  Component Value Date   HGBA1C 8.0 (H) 07/21/2022   MPG 182.9 07/21/2022   TSH Recent Labs    07/21/22 1907  TSH 0.453   Imaging: Imaging results have been reviewed and No results found.  Cardiac Studies:  EKG:  07/25/2022: Atrial fibrillation with rapid ventricular response. ST & T wave abnormality, consider inferolateral ischemia. Abnormal ECG. When compared with ECG of 25-Jul-2022 09:04, sinus tachcyardia is changed to Afib with RVR    07/26/2022: normal sinus rhythm verified in endo. TEE with DCCV cancelled  Recent Results (from the past 43800 hour(s))  ECHOCARDIOGRAM COMPLETE   Collection Time: 07/22/22  9:45 AM  Result Value   Weight 3,978.86   Height 70   BP 117/68   Single Plane A2C EF 66.4   Single Plane A4C EF 68.9   Calc EF 69.0   S' Lateral 2.80   AR max vel 2.14   AV Area VTI 2.25   AV Mean grad 3.0   AV Peak grad 6.0   Ao pk vel 1.22   Area-P 1/2 3.57   AV Area mean vel 2.10   Est EF 60 - 65%   Narrative      ECHOCARDIOGRAM REPORT       Patient Name:   Alan Lin Date of Exam: 07/22/2022 Medical  Rec #:  [id number]        Height:       70.0 in Accession #:    [id number]       Weight:       248.7 lb Date of Birth:  10/28/1955         BSA:          2.290 m Patient Age:    1 years         BP:           117/68 mmHg Patient Gender: M                HR:           89 bpm. Exam Location:  Inpatient  Procedure: 2D Echo and Intracardiac Opacification Agent  Indications:    syncope   History:        Patient has no prior history of Echocardiogram examinations.                 Risk Factors:Hypertension, Diabetes and Dyslipidemia.   Sonographer:    Harvie Junior Referring Phys: [id number] ANASTASSIA DOUTOVA    Sonographer Comments: Technically difficult study due to poor echo windows, suboptimal apical window, suboptimal parasternal window, suboptimal subcostal window and patient is obese. Image acquisition challenging due to patient body habitus, Image  acquisition challenging due to respiratory motion and broken ribs. IMPRESSIONS    1. Windows not well visualized. Cannot assess wall motion. Left ventricular ejection fraction, by estimation, is 60 to 65%. The left ventricle has normal function.  2. Right ventricular systolic function is normal. The right ventricular size is normal.  3. No evidence of mitral valve regurgitation.  4. Aortic valve regurgitation is not visualized.  5. The inferior vena cava is normal in size with greater than 50% respiratory variability, suggesting right atrial pressure of 3 mmHg.  FINDINGS  Left Ventricle: Windows not  well visualized. Cannot assess wall motion. Left ventricular ejection fraction, by estimation, is 60 to 65%. The left ventricle has normal function. Definity contrast agent was given IV to delineate the left ventricular  endocardial borders. The left ventricular internal cavity size was normal in size.  Right Ventricle: The right ventricular size is normal. Right ventricular systolic function is normal.  Left Atrium: Left atrial size was not well  visualized.  Right Atrium: Right atrial size was not well visualized.  Pericardium: The pericardium was not well visualized.  Mitral Valve: No evidence of mitral valve regurgitation.  Tricuspid Valve: Tricuspid valve regurgitation is not demonstrated.  Aortic Valve: Aortic valve regurgitation is not visualized. Aortic valve mean gradient measures 3.0 mmHg. Aortic valve peak gradient measures 6.0 mmHg. Aortic valve area, by VTI measures 2.25 cm.  Pulmonic Valve: Pulmonic valve regurgitation is not visualized.  Aorta: The aortic root is normal in size and structure.  Venous: The inferior vena cava is normal in size with greater than 50% respiratory variability, suggesting right atrial pressure of 3 mmHg.  IAS/Shunts: The interatrial septum was not well visualized.    LEFT VENTRICLE PLAX 2D LVIDd:         4.20 cm     Diastology LVIDs:         2.80 cm     LV e' medial:    7.29 cm/s LV PW:         1.00 cm     LV E/e' medial:  7.6 LV IVS:        0.90 cm     LV e' lateral:   8.05 cm/s LVOT diam:     2.30 cm     LV E/e' lateral: 6.9 LV SV:         51 LV SV Index:   22 LVOT Area:     4.15 cm   LV Volumes (MOD) LV vol d, MOD A2C: 75.4 ml LV vol d, MOD A4C: 92.6 ml LV vol s, MOD A2C: 25.3 ml LV vol s, MOD A4C: 28.8 ml LV SV MOD A2C:     50.1 ml LV SV MOD A4C:     92.6 ml LV SV MOD BP:      59.1 ml  RIGHT VENTRICLE RV S prime:     16.00 cm/s TAPSE (M-mode): 2.0 cm  LEFT ATRIUM           Index LA diam:      3.70 cm 1.62 cm/m LA Vol (A4C): 58.6 ml 25.59 ml/m  AORTIC VALVE                    PULMONIC VALVE AV Area (Vmax):    2.14 cm     PV Vmax:       0.99 m/s AV Area (Vmean):   2.10 cm     PV Peak grad:  3.9 mmHg AV Area (VTI):     2.25 cm AV Vmax:           122.00 cm/s AV Vmean:          81.900 cm/s AV VTI:            0.227 m AV Peak Grad:      6.0 mmHg AV Mean Grad:      3.0 mmHg LVOT Vmax:         62.70 cm/s LVOT Vmean:        41.400 cm/s LVOT VTI:  0.123 m LVOT/AV VTI ratio: 0.54   AORTA Ao Root diam: 3.50 cm  MITRAL VALVE MV Area (PHT): 3.57 cm    SHUNTS MV Decel Time: 213 msec    Systemic VTI:  0.12 m MV E velocity: 55.25 cm/s  Systemic Diam: 2.30 cm MV A velocity: 51.45 cm/s MV E/A ratio:  1.07  Photographer signed by Carolan Clines Signature Date/Time: 07/22/2022/9:56:01 AM       Final     *Note: Due to a large number of results and/or encounters for the requested time period, some results have not been displayed. A complete set of results can be found in Results Review.    Scheduled Meds:  amiodarone  400 mg Oral BID   DULoxetine  40 mg Oral Daily   fentaNYL  1 patch Transdermal Q72H   gabapentin  300 mg Oral Daily   insulin aspart  0-9 Units Subcutaneous Q4H   metoCLOPramide (REGLAN) injection  5 mg Intravenous Q8H   metoprolol succinate  50 mg Oral BID   pantoprazole  40 mg Oral BID   senna  2 tablet Oral Daily   simvastatin  20 mg Oral Daily   Continuous Infusions: PRN Meds:.acetaminophen **OR** acetaminophen, diltiazem, HYDROcodone-acetaminophen, HYDROmorphone (DILAUDID) injection, metoprolol tartrate, ondansetron (ZOFRAN) IV, traMADol  Assessment  TRELON PLUSH is a 67 y.o. male patient with syncope, new onset Afib, and diabetes   New onset Afib RVR, currently CVR CHA2DS2VASc = at least 3 Echocardiogram reviewed. Avoid Qtc prolonging drugs.  Maintain K>4 and Mag>2. Continue Toprol XL 50mg  BID. Currently on Amio 400 mg BID for 1 week. Will need to take 200 mg BID for 1 week after that, then 200 mg qDay thereafter. Continue Eliquis 5 mg BID. Will obtain stress test in outpatient setting given rib fractures. Recommend rehab prior to d/c home.  Patient has follow-up visit in office with me on Feb 14.    Feb 16, MD, [facility] 07/27/2022, 9:04 AM Office: [phone] Fax: [phone] Pager: [phone]

## 2022-07-27 NOTE — Progress Notes (Signed)
Mobility Specialist Progress Note   07/27/22 1715  Mobility  Activity Ambulated with assistance in hallway  Level of Assistance Moderate assist, patient does 50-74%  Assistive Device Front wheel walker  Distance Ambulated (ft) 112 ft (56 + 56)  Activity Response Tolerated well  Mobility Referral Yes  $Mobility charge 1 Mobility   Pt agreeable to mobility. X1 seated rest break d/t increased pain in L ribs but able to recover and return back to bed w/o fault. Call bell placed in reach.  Holland Falling Mobility Specialist Please contact via SecureChat or  Rehab office at [phone]

## 2022-07-27 NOTE — Progress Notes (Signed)
Upon bedside report/shift change. This RN and previous night RN noticed patient had removed IV where heparin gtt was going through. Pt has no recollection of removing IV. Patient remains in bed with telemetry leads and gown removed with C-PAP on. Patient remains intermittently confused and stable at this time. IVT consult placed.

## 2022-07-27 NOTE — Telephone Encounter (Signed)
Patient is still currently admitted in the hospital.

## 2022-07-27 NOTE — Progress Notes (Signed)
PROGRESS NOTE                                                                                                                                                                                                             Patient Demographics:    Alan Lin, is a 67 y.o. male, DOB - Mar 15, 1956, ZOX:[id number]  Outpatient Primary MD for the patient is Doug Sou, MD    LOS - 5  Admit date - 07/21/2022    Chief Complaint  Patient presents with   Fall   Loss of Consciousness       Brief Narrative (HPI from H&P)    67 y.o. male with medical history significant of Dm2, HTN, falls.  Resented to the ER after an episode of diarrhea last night after which she took 1 Imodium, thereafter he went to the bathroom 4 AM in the morning where he syncopized once and twice again thereafter, he was slightly lightheaded during these episodes but did not have any chest pain or shortness of breath.  He recently had a C-spine surgery for which she wears a c-collar, during his falls he did hurt his left-sided rib cage.  Brought to the ER where head CT and C-spine CT were stable, CTA chest was stable, he did have fractures on his left rib cage, he was admitted for further workup for syncope.    Subjective:   No significant events overnight as discussed with staff, patient denies any complaints.    Assessment  & Plan :   Multiple falls after syncope and lightheadedness in a patient with previous history of falls.  Unclear reason but could be due to orthostatic hypotension caused by an episode of diarrhea the night of admission.  CT head unremarkable, no focal deficits, EEG, echocardiogram and carotid venous duplex are stable, stable on telemetry, stable orthostatics, seen by PT OT qualifies for SNF, hydrated IV fluids.  Much improved.  Have also ordered 30-day event monitor upon discharge.  Peripheral neuropathy.  Noted on EMG studies done outpatient  recently.   Outpatient follow-up with his neurologist, PT OT here, may require placement.    Gastroenteritis.   - Questionable food poisoning at night on 07/22/2022, CT abdomen pelvis suggests possible esophagitis, placed on twice daily PPI and supportive care with antinausea medicines and Reglan for a few days, GI  on board.  No EGD for now per GI.  Symptoms much improved and almost completely resolved.  Discharge follow-up with [facility].  Chronic neck pain and C-spine surgery.  Continue c-collar on a as needed basis, patient was wearing it by his own choice, follow-up with orthopedic surgeon postdischarge.  Dehydration with hypomagnesemia, hypophosphatemia and hypokalemia.  IV fluids, replace electrolytes.  Hyponatremia.   -Improved with hydration  Left rib cage fractures.   - Supportive care.  Pain medications adjusted for better control on 07/23/2022. -Encouraged use incentive spirometer  OSA.  CPAP at night.  Hypertension.  Pressure stable placed on beta-blocker.  New onset A-fib RVR [place] vas 2 score of at least 3 or above.  Rhythm strips were evaluated by cardiologist Dr. Harl Bowie on 07/23/2022 ruled out atrial fibrillation, he has been hydrated with IV fluids, placed on oral beta-blocker, stable TSH and echo, continues to have runs of tachycardia, initially he was thought to have sinus tachycardia with PACs but now cardiology thinks that he has paroxysmal atrial fibrillation, currently on amiodarone drip along with heparin drip, on oral beta-blocker as tolerated by blood pressure.  -TEE with cardioversion on 05/26/2023 was canceled as patient converted back to NSR -Continue with amiodarone per cards recommendation, will transition to Eliquis.  Mild metabolic encephalopathy.  At times getting confused.  Has no recollection of seeing me yesterday, was upset that the doctor did not come and update him at 2 AM in the morning for his nausea vomiting.  Brother confirms that he has some memory  issues and can get confused from time to time.  Minimize narcotics and benzodiazepines.  At risk for delirium.      Condition - Fair  Family Communication  : None at bedside  Code Status :  Full  Consults  :  Cards  PUD Prophylaxis :    Procedures  :     Due for TEE and DC cardioversion on 07/26/2022   CT abdomen pelvis.  1. Bilateral nonobstructive nephrolithiasis. 2. Acute fractures of the left seventh, eighth, and ninth ribs, with the seventh rib fracture displaced by about 2 mm. These fractures were reported on prior CTA chest of 07/21/2022. 3. Circumferential distal esophageal wall thickening is nonspecific but distal esophagitis would be a common cause for this appearance. 4. Diffuse hepatic steatosis. 5. Lower lumbar degenerative disc disease causing bilateral foraminal impingement at L4-5 and L5-S1. 6. 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 7. Aortic atherosclerosis. Aortic Atherosclerosis.  EEG - Non acute  Carotid Doppler.  Non acute  TTE - 1. Windows not well visualized. Cannot assess wall motion. Left ventricular ejection fraction, by estimation, is 60 to 65%. The left ventricle has normal function.  2. Right ventricular systolic function is normal. The right ventricular size is normal.  3. No evidence of mitral valve regurgitation.  4. Aortic valve regurgitation is not visualized.  5. The inferior vena cava is normal in size with greater than 50% respiratory variability, suggesting right atrial pressure of 3 mmHg.  CTA -  Fractures of the left sixth, seventh and eighth ribs without complicating factors. No evidence of pulmonary emboli. Mild right basilar atelectasis. Aortic Atherosclerosis  CT Head - Non acute  CT C Spine - 1. No acute cervical spine fracture. 2. Prior C4-5 ACDF, with multilevel spondylosis and facet hypertrophy as above.       Disposition Plan  :    Status is:  Observation  DVT Prophylaxis  :    SCDs Start: 07/21/22 2209 apixaban (ELIQUIS) tablet 5 mg     Lab Results  Component Value Date   PLT 264 07/27/2022    Diet :  Diet Order             Diet heart healthy/carb modified Room service appropriate? Yes; Fluid consistency: Thin  Diet effective now                    Inpatient Medications  Scheduled Meds:  amiodarone  400 mg Oral BID   apixaban  5 mg Oral BID   DULoxetine  40 mg Oral Daily   fentaNYL  1 patch Transdermal Q72H   gabapentin  300 mg Oral Daily   insulin aspart  0-9 Units Subcutaneous Q4H   metoCLOPramide  5 mg Oral TID AC   metoprolol succinate  50 mg Oral BID   pantoprazole  40 mg Oral BID   senna  2 tablet Oral Daily   simvastatin  20 mg Oral Daily   Continuous Infusions:    PRN Meds:.acetaminophen **OR** acetaminophen, diltiazem, HYDROcodone-acetaminophen, HYDROmorphone (DILAUDID) injection, metoprolol tartrate, ondansetron (ZOFRAN) IV, traMADol  Antibiotics  :    Anti-infectives (From admission, onward)    None         Objective:   Vitals:   07/27/22 0751 07/27/22 0809 07/27/22 1136 07/27/22 1153  BP:  (!) 155/90 (!) 195/81 (!) 157/79  Pulse:  68 68   Resp:  15 16   Temp:  97.9 F (36.6 C) 98.1 F (36.7 C)   TempSrc:  Oral Oral   SpO2: 97% 94% 91%   Weight:      Height:        Wt Readings from Last 3 Encounters:  07/26/22 112.8 kg  07/05/22 117.9 kg  05/21/22 117.9 kg     Intake/Output Summary (Last 24 hours) at 07/27/2022 1345 Last data filed at 07/27/2022 0110 Gross per 24 hour  Intake --  Output 1000 ml  Net -1000 ml     Physical Exam   No Cyanosis, Clubbing or edema  Awake Alert, Oriented X 3, No new F.N deficits, Normal affect Symmetrical Chest wall movement, Good air movement bilaterally, CTAB RRR,No Gallops,Rubs or new Murmurs, No Parasternal Heave +ve B.Sounds, Abd Soft, No tenderness, No rebound - guarding or rigidity. No Cyanosis, Clubbing or edema, No  new Rash or bruise        Data Review:    Recent Labs  Lab 07/23/22 0334 07/24/22 0308 07/25/22 0214 07/26/22 0506 07/27/22 0614  WBC 11.7* 14.7* 13.9* 11.6* 10.1  HGB 16.0 14.7 14.8 14.5 14.5  HCT 46.1 42.5 44.6 41.6 41.4  PLT 254 269 273 246 264  MCV 83.1 83.7 85.9 82.9 82.1  MCH 28.8 28.9 28.5 28.9 28.8  MCHC 34.7 34.6 33.2 34.9 35.0  RDW 13.0 13.0 13.0 12.8 12.8  LYMPHSABS 1.0 2.2 2.2 2.1 1.6  MONOABS 0.8 1.3* 1.1* 1.0 1.0  EOSABS 0.0 0.1 0.1 0.1 0.1  BASOSABS 0.1 0.1 0.1 0.1 0.1    Recent Labs  Lab 07/21/22 1710 07/21/22 1907 07/21/22 1940 07/21/22 2216 07/22/22 0135 07/22/22 0434 07/22/22 0702 07/22/22 0704 07/23/22 0334 07/24/22 0308 07/25/22 0214 07/25/22 1824 07/26/22 0506 07/27/22 0614  NA 134*  --   --   --  133*  --  137  --  136 133* 131*  --  130* 133*  K 2.7*  --   --   --  3.9  --  3.6  --  3.0* 3.7 3.5  --  3.4* 3.1*  CL 102  --   --   --  100  --  101  --  98 100 96*  --  93* 97*  CO2 19*  --   --   --  21*  --  28  --  24 25 26   --  28 26  ANIONGAP 13  --   --   --  12  --  8  --  14 8 9   --  9 10  GLUCOSE 280*  --   --   --  258*  --  166*  --  234* 188* 162*  --  186* 163*  BUN 24*  --   --   --  24*  --  22  --  15 16 13   --  14 10  CREATININE 0.82  --   --   --  1.02  --  0.97  --  0.85 0.74 0.79  --  0.88 0.77  AST 46*  --   --   --   --   --  26  --  30 14* 17  --  13* 16  ALT 37  --   --   --   --   --  31  --  37 28 26  --  24 22  ALKPHOS 45  --   --   --   --   --  46  --  50 48 48  --  48 45  BILITOT 1.0  --   --   --   --   --  0.7  --  1.1 1.2 1.0  --  1.0 1.0  ALBUMIN 3.1*  --   --   --   --   --  3.1*  --  3.4* 3.1* 3.0*  --  2.7* 2.7*  DDIMER 1.43*  --   --   --   --   --   --   --   --   --   --   --   --   --   LATICACIDVEN  --   --  1.7 2.2* 2.4* 1.9  --   --   --   --   --   --   --   --   INR 1.2  --   --   --   --   --   --   --   --   --   --  1.1  --   --   TSH  --  0.453  --   --   --   --   --   --   --   --   --    --   --   --   HGBA1C 8.0*  --   --   --   --   --   --   --   --   --   --   --   --   --   BNP  --   --   --   --   --   --   --  67.8 75.0 86.8 182.0*  --  127.2*  --   MG 1.6*  --   --   --   --   --  2.1  --  1.8 2.1 1.8  --  1.8  --  CALCIUM 8.4*  --   --   --  8.9  --  9.0  --  9.6 8.9 9.0  --  8.8* 8.6*    Lab Results  Component Value Date   HGBA1C 8.0 (H) 07/21/2022    No results for input(s): "TSH", "T4TOTAL", "T3FREE", "THYROIDAB" in the last 72 hours.  Invalid input(s): "FREET3"  Radiology Reports DG Chest Port 1 View  Result Date: 07/25/2022 CLINICAL DATA:  Shortness of breath, left rib fractures. EXAM: PORTABLE CHEST 1 VIEW COMPARISON:  07/21/2022 and CT chest 07/21/2022. FINDINGS: Trachea is midline. Heart size stable. Thoracic aorta is calcified. Lungs are low in volume minimal streaky volume loss in the right lung base. Right hemidiaphragm is elevated. No airspace consolidation or pleural fluid. Left sixth, seventh and eighth rib fractures are noted. IMPRESSION: 1. No acute pulmonary parenchymal findings. 2. Acute left sixth, seventh and eighth rib fractures. Electronically Signed   By: Lorin Picket M.D.   On: 07/25/2022 09:35      Signature  -   Phillips Climes M.D on 07/27/2022 at 1:45 PM   -  To page go to [email or web address]

## 2022-07-28 DIAGNOSIS — I480 Paroxysmal atrial fibrillation: Secondary | ICD-10-CM | POA: Diagnosis not present

## 2022-07-28 DIAGNOSIS — R55 Syncope and collapse: Secondary | ICD-10-CM | POA: Diagnosis not present

## 2022-07-28 DIAGNOSIS — I10 Essential (primary) hypertension: Secondary | ICD-10-CM | POA: Diagnosis not present

## 2022-07-28 LAB — CBC
HCT: 45.3 % (ref 39.0–52.0)
Hemoglobin: 15.2 g/dL (ref 13.0–17.0)
MCH: 28.2 pg (ref 26.0–34.0)
MCHC: 33.6 g/dL (ref 30.0–36.0)
MCV: 84 fL (ref 80.0–100.0)
Platelets: 287 10*3/uL (ref 150–400)
RBC: 5.39 MIL/uL (ref 4.22–5.81)
RDW: 12.9 % (ref 11.5–15.5)
WBC: 10.5 10*3/uL (ref 4.0–10.5)
nRBC: 0 % (ref 0.0–0.2)

## 2022-07-28 LAB — GLUCOSE, CAPILLARY
Glucose-Capillary: 165 mg/dL — ABNORMAL HIGH (ref 70–99)
Glucose-Capillary: 177 mg/dL — ABNORMAL HIGH (ref 70–99)
Glucose-Capillary: 187 mg/dL — ABNORMAL HIGH (ref 70–99)
Glucose-Capillary: 278 mg/dL — ABNORMAL HIGH (ref 70–99)

## 2022-07-28 LAB — BASIC METABOLIC PANEL
Anion gap: 9 (ref 5–15)
BUN: 8 mg/dL (ref 8–23)
CO2: 28 mmol/L (ref 22–32)
Calcium: 9 mg/dL (ref 8.9–10.3)
Chloride: 96 mmol/L — ABNORMAL LOW (ref 98–111)
Creatinine, Ser: 0.85 mg/dL (ref 0.61–1.24)
GFR, Estimated: >60 mL/min (ref >60)
Glucose, Bld: 179 mg/dL — ABNORMAL HIGH (ref 70–99)
Potassium: 3.3 mmol/L — ABNORMAL LOW (ref 3.5–5.1)
Sodium: 133 mmol/L — ABNORMAL LOW (ref 135–145)

## 2022-07-28 MED ORDER — POTASSIUM CHLORIDE CRYS ER 20 MEQ PO TBCR: 20.0000 meq | EXTENDED_RELEASE_TABLET | Freq: Every day | ORAL | 20 refills | Status: DC

## 2022-07-28 MED ORDER — ACETAMINOPHEN 325 MG PO TABS: 325.0000 mg | ORAL_TABLET | Freq: Four times a day (QID) | ORAL | Status: AC | PRN

## 2022-07-28 MED ORDER — METOPROLOL SUCCINATE ER 50 MG PO TB24: 50.0000 mg | ORAL_TABLET | Freq: Two times a day (BID) | ORAL | Status: DC

## 2022-07-28 MED ORDER — POTASSIUM CHLORIDE CRYS ER 20 MEQ PO TBCR
40.0000 meq | EXTENDED_RELEASE_TABLET | ORAL | Status: AC
  Administered 2022-07-28 (×2): 40 meq via ORAL
  Filled 2022-07-28 (×2): qty 2

## 2022-07-28 MED ORDER — AMIODARONE HCL 200 MG PO TABS: ORAL_TABLET | ORAL | Status: DC

## 2022-07-28 MED ORDER — APIXABAN 5 MG PO TABS: 5.0000 mg | ORAL_TABLET | Freq: Two times a day (BID) | ORAL | Status: DC

## 2022-07-28 MED ORDER — HYDROCODONE-ACETAMINOPHEN 5-325 MG PO TABS: 1.0000 | ORAL_TABLET | Freq: Four times a day (QID) | ORAL | 0 refills | Status: DC | PRN

## 2022-07-28 MED ORDER — PANTOPRAZOLE SODIUM 40 MG PO TBEC: 40.0000 mg | DELAYED_RELEASE_TABLET | Freq: Every day | ORAL | Status: DC

## 2022-07-28 NOTE — Telephone Encounter (Signed)
Called patient, still in admitted in hospital patient mention he will be discharged today but will need to go to hospice. Will call patient tomorrow to do a TOC

## 2022-07-28 NOTE — Discharge Instructions (Signed)

## 2022-07-28 NOTE — Progress Notes (Signed)
Physical Therapy Treatment Patient Details Name: Alan Lin MRN: [id number] DOB: 04/14/1956 Today's Date: 07/28/2022   History of Present Illness 68 y/o M admitted on 2/1 with falls and loss of consciousness. Pt had 3 episodes of syncope, imaging reveals fractures of L 6, 7, 8 ribs. PMHx: DM, HTN, falls, unsteady gait followed by neurology.    PT Comments    Pt continues to progress well with therapy, able to ambulate in the hallways with RW and supervision ~125 feet today. Pt with increased time for gait with very brief rest breaks to self-correct posture. Pt progressing to min guard for sit<>stand transfers, continuing to require increased time due to pain but increasing independence with mobility. Pt will continue to benefit from skilled acute PT to progress mobility and activity tolerance, discharge recommendations remain appropriate.    Recommendations for follow up therapy are one component of a multi-disciplinary discharge planning process, led by the attending physician.  Recommendations may be updated based on patient status, additional functional criteria and insurance authorization.  Follow Up Recommendations  Skilled nursing-short term rehab (<3 hours/day) Can patient physically be transported by private vehicle: No   Assistance Recommended at Discharge Frequent or constant Supervision/Assistance  Patient can return home with the following A little help with walking and/or transfers;Assist for transportation;Help with stairs or ramp for entrance;Assistance with cooking/housework   Equipment Recommendations  None recommended by PT    Recommendations for Other Services       Precautions / Restrictions Precautions Precautions: Fall Precaution Comments: L rib fx's Restrictions Weight Bearing Restrictions: No     Mobility  Bed Mobility               General bed mobility comments: pt seated in chair upon arrival, left in chair at end of session     Transfers Overall transfer level: Needs assistance Equipment used: Rolling walker (2 wheels) Transfers: Sit to/from Stand Sit to Stand: Min guard           General transfer comment: min guard for safety with sit<>stand, no physical assistance required today but increased time required due to pain    Ambulation/Gait Ambulation/Gait assistance: Supervision Gait Distance (Feet): 125 Feet Assistive device: Rolling walker (2 wheels) Gait Pattern/deviations: Decreased stride length, Shuffle, Step-to pattern, Decreased dorsiflexion - left, Decreased dorsiflexion - right Gait velocity: decreased     General Gait Details: pt performing step to pattern throughout gait with decreased B step length and foot clearance, provided visual and verbal cues for increasing step length and foot clearance but little correction noted   Stairs             Wheelchair Mobility    Modified Rankin (Stroke Patients Only)       Balance Overall balance assessment: Needs assistance Sitting-balance support: Feet supported, No upper extremity supported Sitting balance-Leahy Scale: Fair Sitting balance - Comments: stable balance noted with static and dynamic sitting activities   Standing balance support: Reliant on assistive device for balance, During functional activity Standing balance-Leahy Scale: Poor Standing balance comment: Pt utilizing RW for ambulation, relying on external support for balance, no LOB noted                            Cognition Arousal/Alertness: Awake/alert Behavior During Therapy: WFL for tasks assessed/performed Overall Cognitive Status: Within Functional Limits for tasks assessed  General Comments: Cooperative throughout session        Exercises      General Comments General comments (skin integrity, edema, etc.): VSS on room air      Pertinent Vitals/Pain Pain Assessment Pain Assessment:  Faces Faces Pain Scale: Hurts little more Pain Location: ribs Pain Descriptors / Indicators: Sharp (with movement) Pain Intervention(s): Monitored during session    Home Living                          Prior Function            PT Goals (current goals can now be found in the care plan section) Acute Rehab PT Goals Patient Stated Goal: get better PT Goal Formulation: With patient Time For Goal Achievement: 08/05/22 Potential to Achieve Goals: Good Progress towards PT goals: Progressing toward goals    Frequency    Min 3X/week      PT Plan Current plan remains appropriate    Co-evaluation              AM-PAC PT "6 Clicks" Mobility   Outcome Measure  Help needed turning from your back to your side while in a flat bed without using bedrails?: A Little Help needed moving from lying on your back to sitting on the side of a flat bed without using bedrails?: A Little Help needed moving to and from a bed to a chair (including a wheelchair)?: A Little Help needed standing up from a chair using your arms (e.g., wheelchair or bedside chair)?: A Little Help needed to walk in hospital room?: A Little Help needed climbing 3-5 steps with a railing? : A Lot 6 Click Score: 17    End of Session   Activity Tolerance: Patient tolerated treatment well Patient left: with call bell/phone within reach;in chair Nurse Communication: Mobility status PT Visit Diagnosis: Repeated falls (R29.6);Difficulty in walking, not elsewhere classified (R26.2)     Time: 5170-0174 PT Time Calculation (min) (ACUTE ONLY): 18 min  Charges:  $Gait Training: 8-22 mins                     Charlynne Cousins, PT DPT Acute Rehabilitation Services Office [phone]    Luvenia Heller 07/28/2022, 12:56 PM

## 2022-07-28 NOTE — TOC Transition Note (Signed)
Transition of Care [facility]) - CM/SW Discharge Note   Patient Details  Name: Alan Lin MRN: [id number] Date of Birth: 06/02/1956  Transition of [facility]) CM/SW Contact:  Benard Halsted, LCSW Phone Number: 07/28/2022, 11:12 AM   Clinical Narrative:    Patient will DC to: [facility] Anticipated DC date: 07/28/22 Family notified: Brother, Product manager by: Corey Harold   Per MD patient ready for DC to [facility]. RN to call report prior to discharge [phone]). RN, patient, patient's family, and facility notified of DC. Discharge Summary and FL2 sent to facility. DC packet on chart. Ambulance transport requested for patient.   CSW will sign off for now as social work intervention is no longer needed. Please consult [place] again if new needs arise.     Final next level of care: Skilled Nursing Facility Barriers to Discharge: Barriers Resolved   Patient Goals and CMS Choice CMS Medicare.gov Compare Post Acute Care list provided to:: Patient Choice offered to / list presented to : Patient  Discharge Placement     Existing PASRR number confirmed : 07/28/22          Patient [facility] bed at: [facility] Patient to be transferred to facility by: [facility] Name of family member notified: Brother Patient and family notified of of transfer: 07/28/22  Discharge Plan and Services Additional resources added to the After Visit Summary for   In-house Referral: Clinical Social Work   Post Acute Care Choice: [facility]                               Social Determinants of Health (SDOH) Interventions SDOH Screenings   Food Insecurity: No Food Insecurity (07/21/2022)  Housing: Low Risk  (07/21/2022)  Transportation Needs: No Transportation Needs (07/21/2022)  Utilities: Not At Risk (07/21/2022)  Tobacco Use: Low Risk  (07/26/2022)     Readmission Risk Interventions     No data to display

## 2022-07-28 NOTE — Discharge Summary (Signed)
Physician Discharge Summary  Alan Lin [id number] DOB: April 03, 1956 DOA: 07/21/2022  PCP: Esmond Harps, MD  Admit date: 07/21/2022 Discharge date: 07/28/2022  Admitted From: (Home) Disposition:  (SNF)  Recommendations for Outpatient Follow-up:  Patient to follow with cardiology as an outpatient, appointment already has been scheduled with Dr. Shellia Carwin  on 08/03/2022 Patient was started on amiodarone and liquids during hospital stay Monitor blood pressure and adjust medication as needed Monitor BMP adjust potassium supplement as needed Please keep encouraging to use incentive spirometer Patient may continue to use his home CPAP machine at bedtime  Discharge Condition: (Stable) CODE STATUS: (FULL) Diet recommendation: Heart Healthy  Brief/Interim Summary:  67 y.o. male with medical history significant of Dm2, HTN, falls.  Resented to the ER after an episode of diarrhea last night after which she took 1 Imodium, thereafter he went to the bathroom 4 AM in the morning where he syncopized once and twice again thereafter, he was slightly lightheaded during these episodes but did not have any chest pain or shortness of breath.  He recently had a C-spine surgery for which she wears a c-collar, during his falls he did hurt his left-sided rib cage.  Brought to the ER where head CT and C-spine CT were stable, CTA chest was stable, he did have fractures on his left rib cage, he was admitted for further workup for syncope.   Multiple falls after syncope and lightheadedness in a patient with previous history of falls.  Unclear reason but could be due to orthostatic hypotension caused by an episode of diarrhea the night of admission.  CT head unremarkable, no focal deficits, EEG, echocardiogram and carotid venous duplex are stable, stable on telemetry, stable orthostatics, seen by PT OT qualifies for SNF, hydrated IV fluids.  Much improved.  He has been followed by cardiology.   Peripheral neuropathy.   Noted on EMG studies done outpatient recently.   Outpatient follow-up with his neurologist, PT OT here, patient will go to subacute rehab   Gastroenteritis.   - Questionable food poisoning at night on 07/22/2022, CT abdomen pelvis suggests possible esophagitis, placed on twice daily PPI and supportive care with antinausea medicines and Reglan for a few days, GI on board.  No EGD for now per GI.  Symptoms much improved and almost completely resolved.  Discharge follow-up with [facility].   Chronic neck pain and C-spine surgery.  Continue c-collar on a as needed basis, patient was wearing it by his own choice, follow-up with orthopedic surgeon postdischarge.   Dehydration with hypomagnesemia, hypophosphatemia and hypokalemia.  -repleted , monitor potassium as an outpatient and adjust supplement as needed   Left rib cage fractures.   - Supportive care.  Continue with pain management, will DC on Vicodin total of 10 tablets -Encouraged use incentive spirometer   OSA.  CPAP at night.   Hypertension.  His metoprolol has been increased in the setting of A-fib, blood pressure remains elevated, will resume back on losartan, and if needed can go back on his home dose Norvasc if remains elevated   New onset A-fib RVR [place] vas 2 score of at least 3 or above.   -Cardiology input greatly appreciated, he was transitioned from amiodarone drip, to p.o. amiodarone and decrease dose Toprol-XL, he will be discharged on amiodarone . -TEE with cardioversion on 05/26/2023 was canceled as patient converted back to NSR -Initially on heparin GTT, transition to Eliquis at time of discharge   Mild metabolic encephalopathy.  At times getting  confused.  Has no recollection of seeing me yesterday, was upset that the doctor did not come and update him at 2 AM in the morning for his nausea vomiting.  Brother confirms that he has some memory issues and can get confused from time to time.  Minimize narcotics and benzodiazepines.   At risk for delirium.  Discharge Diagnoses:  Principal Problem:   Syncope Active Problems:   Diabetes mellitus with no complication (HCC)   Hypomagnesemia   Hypokalemia   Hyperlipidemia   Essential hypertension   Hypophosphatemia   OSA (obstructive sleep apnea)   Falls   Rib fracture   Acute esophagitis    Discharge Instructions  Discharge Instructions     Diet - low sodium heart healthy   Complete by: As directed    Discharge instructions   Complete by: As directed    Follow with Primary MD/SNF physicia   Activity: As tolerated with Full fall precautions use walker/cane & assistance as needed   Disposition SNF   Diet: Heart Healthy with feeding assistance and aspiration precautions.    your next visit with your primary care physician please Get Medicines reviewed and adjusted.   Please request your Prim.MD to go over all Hospital Tests and Procedure/Radiological results at the follow up, please get all Hospital records sent to your Prim MD by signing hospital release before you go home.   If you experience worsening of your admission symptoms, develop shortness of breath, life threatening emergency, suicidal or homicidal thoughts you must seek medical attention immediately by calling 911 or calling your MD immediately  if symptoms less severe.  You Must read complete instructions/literature along with all the possible adverse reactions/side effects for all the Medicines you take and that have been prescribed to you. Take any new Medicines after you have completely understood and accpet all the possible adverse reactions/side effects.   Do not drive, operating heavy machinery, perform activities at heights, swimming or participation in water activities or provide baby sitting services if your were admitted for syncope or siezures until you have seen by Primary MD or a Neurologist and advised to do so again.  Do not drive when taking Pain medications.    Do not  take more than prescribed Pain, Sleep and Anxiety Medications  Special Instructions: If you have smoked or chewed Tobacco  in the last 2 yrs please stop smoking, stop any regular Alcohol  and or any Recreational drug use.  Wear Seat belts while driving.   Please note  You were cared for by a hospitalist during your hospital stay. If you have any questions about your discharge medications or the care you received while you were in the hospital after you are discharged, you can call the unit and asked to speak with the hospitalist on call if the hospitalist that took care of you is not available. Once you are discharged, your primary care physician will handle any further medical issues. Please note that NO REFILLS for any discharge medications will be authorized once you are discharged, as it is imperative that you return to your primary care physician (or establish a relationship with a primary care physician if you do not have one) for your aftercare needs so that they can reassess your need for medications and monitor your lab values.   Increase activity slowly   Complete by: As directed       Allergies as of 07/28/2022       Reactions   Metformin Diarrhea, Nausea  And Vomiting   Oxycodone Other (See Comments)   Constipation   Oxycodone-acetaminophen    Constipation   Lisinopril Cough   Morphine Itching   Other reaction(s): Other (See Comments)  Constipation   Morphine And Related Itching        Medication List     STOP taking these medications    amLODipine 5 MG tablet Commonly known as: NORVASC   chlorthalidone 25 MG tablet Commonly known as: HYGROTON       TAKE these medications    acetaminophen 325 MG tablet Commonly known as: TYLENOL Take 1 tablet (325 mg total) by mouth every 6 (six) hours as needed for mild pain (or Fever >/= 101). What changed:  medication strength how much to take reasons to take this additional instructions   amiodarone 200 MG  tablet Commonly known as: PACERONE Amio 400 mg BID for 5 days. Then decrease to  200 mg BID for 1 week , then 200 mg qDay thereafter.   apixaban 5 MG Tabs tablet Commonly known as: ELIQUIS Take 1 tablet (5 mg total) by mouth 2 (two) times daily.   cyclobenzaprine 10 MG tablet Commonly known as: FLEXERIL Take 10 mg by mouth daily as needed for muscle spasms.   diclofenac Sodium 1 % Gel Commonly known as: VOLTAREN Apply 2 g topically 4 (four) times daily as needed (pain).   DULoxetine 20 MG capsule Commonly known as: CYMBALTA Take 60 mg by mouth daily.   gabapentin 300 MG capsule Commonly known as: NEURONTIN Take 300-1,500 mg by mouth daily. Take 300mg  (1 capsule) by mouth in the mornings and 1500mg  (5 capsules) in the evening.   HYDROcodone-acetaminophen 5-325 MG tablet Commonly known as: NORCO/VICODIN Take 1 tablet by mouth every 6 (six) hours as needed for moderate pain.   losartan 50 MG tablet Commonly known as: COZAAR Take 50 mg by mouth daily.   metoprolol succinate 50 MG 24 hr tablet Commonly known as: TOPROL-XL Take 1 tablet (50 mg total) by mouth 2 (two) times daily. Take with or immediately following a meal. What changed:  medication strength how much to take when to take this additional instructions   multivitamin with minerals tablet Take 1 tablet by mouth daily.   pantoprazole 40 MG tablet Commonly known as: PROTONIX Take 1 tablet (40 mg total) by mouth daily.   potassium chloride SA 20 MEQ tablet Commonly known as: KLOR-CON M Take 1 tablet (20 mEq total) by mouth daily. One po bid x 3 days, then one po once a day What changed:  how much to take how to take this when to take this   simvastatin 20 MG tablet Commonly known as: ZOCOR Take 20 mg by mouth daily.        Follow-up Information     Custovic, Collene Mares, DO. Go on 08/03/2022.   Specialty: Cardiology Contact information: [place] [address] [phone]                 Allergies  Allergen Reactions   Metformin Diarrhea and Nausea And Vomiting   Oxycodone Other (See Comments)    Constipation   Oxycodone-Acetaminophen     Constipation   Lisinopril Cough   Morphine Itching    Other reaction(s): Other (See Comments)  Constipation   Morphine And Related Itching    Consultations: .  [facility] cardiology   Procedures/Studies: DG Chest Port 1 View  Result Date: 07/25/2022 CLINICAL DATA:  Shortness of breath, left rib fractures.  EXAM: PORTABLE CHEST 1 VIEW COMPARISON:  07/21/2022 and CT chest 07/21/2022. FINDINGS: Trachea is midline. Heart size stable. Thoracic aorta is calcified. Lungs are low in volume minimal streaky volume loss in the right lung base. Right hemidiaphragm is elevated. No airspace consolidation or pleural fluid. Left sixth, seventh and eighth rib fractures are noted. IMPRESSION: 1. No acute pulmonary parenchymal findings. 2. Acute left sixth, seventh and eighth rib fractures. Electronically Signed   By: Lorin Picket M.D.   On: 07/25/2022 09:35   CT ABDOMEN PELVIS WO CONTRAST  Result Date: 07/23/2022 CLINICAL DATA:  Abdominal pain EXAM: CT ABDOMEN AND PELVIS WITHOUT CONTRAST TECHNIQUE: Multidetector CT imaging of the abdomen and pelvis was performed following the standard protocol without IV contrast. RADIATION DOSE REDUCTION: This exam was performed according to the departmental dose-optimization program which includes automated exposure control, adjustment of the mA and/or kV according to patient size and/or use of iterative reconstruction technique. COMPARISON:  05/21/2022 FINDINGS: Lower chest: Coronary and descending thoracic aortic atherosclerosis. Stable scarring in the right lower lobe. Circumferential distal esophageal wall thickening is nonspecific but distal esophagitis would be a common cause for this appearance. Mildly elevated right hemidiaphragm. Hepatobiliary: Diffuse hepatic steatosis. Gallbladder unremarkable.  No biliary dilatation. Pancreas: Unremarkable Spleen: Unremarkable Adrenals/Urinary Tract: Both adrenal glands appear normal. Spill to nonobstructive right renal calculi, the largest in the right kidney upper pole measuring 0.7 cm in long axis. Three nonobstructive left renal calculi, the largest measuring 0.6 cm in long axis in the left kidney upper pole. No hydronephrosis or hydroureter. Moderately accentuated density of fluid in the urinary bladder, probably from previous contrast administration. Adrenal glands unremarkable. Stomach/Bowel: Unremarkable.  Normal appendix. Vascular/Lymphatic: Atherosclerosis is present, including aortoiliac atherosclerotic disease. 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 Reproductive: Unremarkable Other: No supplemental non-categorized findings. Musculoskeletal: Acute fractures of the left seventh, eighth, and ninth ribs, with the seventh rib fracture displaced by about 2 mm. These fractures were reported on prior CTA chest of 07/21/2022. Bridging spurring of the right sacroiliac joint. Thoracic and lumbar spondylosis with lower lumbar degenerative disc disease, resulting in bilateral foraminal impingement at L5-S1 and L4-5. IMPRESSION: 1. Bilateral nonobstructive nephrolithiasis. 2. Acute fractures of the left seventh, eighth, and ninth ribs, with the seventh rib fracture displaced by about 2 mm. These fractures were reported on prior CTA chest of 07/21/2022. 3. Circumferential distal esophageal wall thickening is nonspecific but distal esophagitis would be a common cause for this appearance. 4. Diffuse hepatic steatosis. 5. Lower lumbar degenerative disc disease causing bilateral foraminal impingement at L4-5 and L5-S1. 6. 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 7. Aortic atherosclerosis. Aortic Atherosclerosis  (ICD10-I70.0). Electronically Signed   By: Van Clines M.D.   On: 07/23/2022 12:38   VAS US CAROTID  Result Date: 07/23/2022 Carotid Arterial Duplex Study Patient Name:  Alan Lin  Date of Exam:   07/22/2022 Medical Rec #: [id number]         Accession #:    [id number] Date of Birth: [birth year]          Patient Gender: M Patient Age:   46 years Exam Location:  [facility] Procedure:      VAS US CAROTID Referring Phys: Nyoka Lint DOUTOVA --------------------------------------------------------------------------------  Indications:       Syncope. Risk Factors:      Hypertension, hyperlipidemia, Diabetes, no history of  smoking. Limitations        Today's exam was limited due to Calcific shadowing, poor                    ultrasound tissue/interface and the body habitus of the                    patient. Comparison Study:  No previous exams Performing Technologist: Jody Hill RVT, RDMS  Examination Guidelines: A complete evaluation includes B-mode imaging, spectral Doppler, color Doppler, and power Doppler as needed of all accessible portions of each vessel. Bilateral testing is considered an integral part of a complete examination. Limited examinations for reoccurring indications may be performed as noted.  Right Carotid Findings: +----------+-------+--------+--------+----------------------+------------------+           PSV    EDV cm/sStenosisPlaque Description    Comments                     cm/s                                                            +----------+-------+--------+--------+----------------------+------------------+ CCA Prox  59     9               hyperechoic and       intimal thickening                                  calcific                                 +----------+-------+--------+--------+----------------------+------------------+ CCA Distal51     6                                     intimal thickening  +----------+-------+--------+--------+----------------------+------------------+ ICA Prox  39     7               calcific              Shadowing          +----------+-------+--------+--------+----------------------+------------------+ ICA Distal53     13              calcific                                 +----------+-------+--------+--------+----------------------+------------------+ ECA       123    9               calcific                                 +----------+-------+--------+--------+----------------------+------------------+ +----------+--------+-------+----------------+-------------------+           PSV cm/sEDV cmsDescribe        Arm Pressure (mmHG) +----------+--------+-------+----------------+-------------------+ [id number]             Multiphasic, WNL                    +----------+--------+-------+----------------+-------------------+ +---------+--------+--+--------+--+---------+  VertebralPSV cm/s54EDV cm/s11Antegrade +---------+--------+--+--------+--+---------+  Left Carotid Findings: +----------+-------+--------+--------+-----------------------+-----------------+           PSV    EDV cm/sStenosisPlaque Description     Comments                    cm/s                                                            +----------+-------+--------+--------+-----------------------+-----------------+ CCA Prox  88     8               calcific               intimal                                                                   thickening        +----------+-------+--------+--------+-----------------------+-----------------+ CCA Distal85     11              heterogenous and       intimal                                            calcific               thickening        +----------+-------+--------+--------+-----------------------+-----------------+ ICA Prox  58     14              calcific               Shadowing          +----------+-------+--------+--------+-----------------------+-----------------+ ICA Distal50     16                                                       +----------+-------+--------+--------+-----------------------+-----------------+ ECA       134                                           shadowing         +----------+-------+--------+--------+-----------------------+-----------------+ +----------+--------+--------+----------------+-------------------+           PSV cm/sEDV cm/sDescribe        Arm Pressure (mmHG) +----------+--------+--------+----------------+-------------------+ [id number]             Multiphasic, WNL                    +----------+--------+--------+----------------+-------------------+ +---------+--------+--+--------+--------------+ VertebralPSV cm/s32EDV cm/sHigh resistant +---------+--------+--+--------+--------------+   Summary: Right Carotid: Velocities in the right ICA are consistent with a 1-39% stenosis. Left Carotid: Velocities in the left ICA are consistent with a 1-39% stenosis.  The extracranial vessels were near-normal with only minimal wall               thickening or plaque. Vertebrals:  Right vertebral artery demonstrates antegrade flow. Left vertebral              artery demonstrates high resistant flow. Subclavians: Normal flow hemodynamics were seen in bilateral subclavian              arteries. *See table(s) above for measurements and observations.  Electronically signed by Orlie Pollen on 07/23/2022 at 10:34:35 AM.    Final    DG Abd 1 View  Result Date: 07/23/2022 CLINICAL DATA:  Vomiting with nausea EXAM: ABDOMEN - 1 VIEW COMPARISON:  None Available. FINDINGS: The stomach is distended. Otherwise limited visualization of bowel gas pattern due to body habitus. IMPRESSION: No dilated small bowel visible, but study limited by body habitus. Electronically Signed   By: Ulyses Jarred M.D.   On: 07/23/2022 03:24   EEG  adult  Result Date: 07/22/2022 Lora Havens, MD     07/22/2022  5:01 PM Patient Name: NOCTIS FIEN MRN: [id number] Epilepsy Attending: Lora Havens Referring Physician/Provider: Thurnell Lose, MD Date: 07/22/2022 Duration: 25.06 mins Patient history: 67yo M with syncope. EEG to evaluate for seizure Level of alertness: Awake AEDs during EEG study: None Technical aspects: This EEG study was done with scalp electrodes positioned according to the 10-20 International system of electrode placement. Electrical activity was reviewed with band pass filter of 1-70Hz , sensitivity of 7 uV/mm, display speed of 67mm/sec with a 60Hz  notched filter applied as appropriate. EEG data were recorded continuously and digitally stored.  Video monitoring was available and reviewed as appropriate. Description: The posterior dominant rhythm consists of 9 Hz activity of moderate voltage (25-35 uV) seen predominantly in posterior head regions, symmetric and reactive to eye opening and eye closing. Physiologic photic driving was seen during photic stimulation.  Hyperventilation was not performed.   IMPRESSION: This study is within normal limits. No seizures or epileptiform discharges were seen throughout the recording. A normal interictal EEG does not exclude  the diagnosis of epilepsy. Lora Havens   ECHOCARDIOGRAM COMPLETE  Result Date: 07/22/2022    ECHOCARDIOGRAM REPORT   Patient Name:   Alan Lin Date of Exam: 07/22/2022 Medical Rec #:  [id number]        Height:       70.0 in Accession #:    [id number]       Weight:       248.7 lb Date of Birth:  Aug 19, 1955         BSA:          2.290 m Patient Age:    67 years         BP:           117/68 mmHg Patient Gender: M                HR:           89 bpm. Exam Location:  Inpatient Procedure: 2D Echo and Intracardiac Opacification Agent Indications:    syncope  History:        Patient has no prior history of Echocardiogram examinations.                 Risk  Factors:Hypertension, Diabetes and Dyslipidemia.  Sonographer:    Harvie Junior Referring Phys: [place]  Sonographer Comments: Technically difficult study due to poor echo  windows, suboptimal apical window, suboptimal parasternal window, suboptimal subcostal window and patient is obese. Image acquisition challenging due to patient body habitus, Image acquisition challenging due to respiratory motion and broken ribs. IMPRESSIONS  1. Windows not well visualized. Cannot assess wall motion. Left ventricular ejection fraction, by estimation, is 60 to 65%. The left ventricle has normal function.  2. Right ventricular systolic function is normal. The right ventricular size is normal.  3. No evidence of mitral valve regurgitation.  4. Aortic valve regurgitation is not visualized.  5. The inferior vena cava is normal in size with greater than 50% respiratory variability, suggesting right atrial pressure of 3 mmHg. FINDINGS  Left Ventricle: Windows not well visualized. Cannot assess wall motion. Left ventricular ejection fraction, by estimation, is 60 to 65%. The left ventricle has normal function. Definity contrast agent was given IV to delineate the left ventricular endocardial borders. The left ventricular internal cavity size was normal in size. Right Ventricle: The right ventricular size is normal. Right ventricular systolic function is normal. Left Atrium: Left atrial size was not well visualized. Right Atrium: Right atrial size was not well visualized. Pericardium: The pericardium was not well visualized. Mitral Valve: No evidence of mitral valve regurgitation. Tricuspid Valve: Tricuspid valve regurgitation is not demonstrated. Aortic Valve: Aortic valve regurgitation is not visualized. Aortic valve mean gradient measures 3.0 mmHg. Aortic valve peak gradient measures 6.0 mmHg. Aortic valve area, by VTI measures 2.25 cm. Pulmonic Valve: Pulmonic valve regurgitation is not visualized. Aorta: The aortic  root is normal in size and structure. Venous: The inferior vena cava is normal in size with greater than 50% respiratory variability, suggesting right atrial pressure of 3 mmHg. IAS/Shunts: The interatrial septum was not well visualized.  LEFT VENTRICLE PLAX 2D LVIDd:         4.20 cm     Diastology LVIDs:         2.80 cm     LV e' medial:    7.29 cm/s LV PW:         1.00 cm     LV E/e' medial:  7.6 LV IVS:        0.90 cm     LV e' lateral:   8.05 cm/s LVOT diam:     2.30 cm     LV E/e' lateral: 6.9 LV SV:         51 LV SV Index:   22 LVOT Area:     4.15 cm  LV Volumes (MOD) LV vol d, MOD A2C: 75.4 ml LV vol d, MOD A4C: 92.6 ml LV vol s, MOD A2C: 25.3 ml LV vol s, MOD A4C: 28.8 ml LV SV MOD A2C:     50.1 ml LV SV MOD A4C:     92.6 ml LV SV MOD BP:      59.1 ml RIGHT VENTRICLE RV S prime:     16.00 cm/s TAPSE (M-mode): 2.0 cm LEFT ATRIUM           Index LA diam:      3.70 cm 1.62 cm/m LA Vol (A4C): 58.6 ml 25.59 ml/m  AORTIC VALVE                    PULMONIC VALVE AV Area (Vmax):    2.14 cm     PV Vmax:       0.99 m/s AV Area (Vmean):   2.10 cm     PV Peak grad:  3.9 mmHg AV Area (VTI):  2.25 cm AV Vmax:           122.00 cm/s AV Vmean:          81.900 cm/s AV VTI:            0.227 m AV Peak Grad:      6.0 mmHg AV Mean Grad:      3.0 mmHg LVOT Vmax:         62.70 cm/s LVOT Vmean:        41.400 cm/s LVOT VTI:          0.123 m LVOT/AV VTI ratio: 0.54  AORTA Ao Root diam: 3.50 cm MITRAL VALVE MV Area (PHT): 3.57 cm    SHUNTS MV Decel Time: 213 msec    Systemic VTI:  0.12 m MV E velocity: 55.25 cm/s  Systemic Diam: 2.30 cm MV A velocity: 51.45 cm/s MV E/A ratio:  1.07 Landscape architect signed by Phineas Inches Signature Date/Time: 07/22/2022/9:56:01 AM    Final    CT Angio Chest Pulmonary Embolism (PE) W or WO Contrast  Result Date: 07/22/2022 CLINICAL DATA:  Difficulty breathing EXAM: CT ANGIOGRAPHY CHEST WITH CONTRAST TECHNIQUE: Multidetector CT imaging of the chest was performed using the standard  protocol during bolus administration of intravenous contrast. Multiplanar CT image reconstructions and MIPs were obtained to evaluate the vascular anatomy. RADIATION DOSE REDUCTION: This exam was performed according to the departmental dose-optimization program which includes automated exposure control, adjustment of the mA and/or kV according to patient size and/or use of iterative reconstruction technique. CONTRAST:  55mL OMNIPAQUE IOHEXOL 350 MG/ML SOLN COMPARISON:  Chest x-ray from earlier in the same day. FINDINGS: Cardiovascular: Atherosclerotic calcifications of the thoracic aorta are noted. No aneurysmal dilatation or dissection is noted. No cardiac enlargement is seen. Coronary calcifications are seen. The pulmonary artery shows a normal branching pattern bilaterally. No intraluminal filling defect to suggest pulmonary embolism is seen. Mediastinum/Nodes: Thoracic inlet is within normal limits. No hilar or mediastinal adenopathy is noted. The esophagus is within normal limits. Lungs/Pleura: Minimal right basilar atelectasis is noted. The lungs are otherwise well aerated. No focal infiltrate or effusion is seen. Upper Abdomen: Visualized upper abdomen is within normal limits. Musculoskeletal: Degenerative changes of the thoracic spine are noted. Mildly displaced fractures of the left sixth, seventh and eighth ribs are noted without complicating factors. Review of the MIP images confirms the above findings. IMPRESSION: Fractures of the left sixth, seventh and eighth ribs without complicating factors. No evidence of pulmonary emboli. Mild right basilar atelectasis. Aortic Atherosclerosis (ICD10-I70.0). Electronically Signed   By: Inez Catalina M.D.   On: 07/22/2022 00:00   CT Cervical Spine Wo Contrast  Result Date: 07/21/2022 CLINICAL DATA:  [place], loss of consciousness EXAM: CT CERVICAL SPINE WITHOUT CONTRAST TECHNIQUE: Multidetector CT imaging of the cervical spine was performed without intravenous  contrast. Multiplanar CT image reconstructions were also generated. RADIATION DOSE REDUCTION: This exam was performed according to the departmental dose-optimization program which includes automated exposure control, adjustment of the mA and/or kV according to patient size and/or use of iterative reconstruction technique. COMPARISON:  05/21/2022 FINDINGS: Alignment: Alignment is grossly anatomic. Skull base and vertebrae: Postsurgical changes at C4-5. There are no acute or destructive bony abnormalities. Soft tissues and spinal canal: No prevertebral fluid or swelling. No visible canal hematoma. Prominent atherosclerosis of the carotid bifurcations. Disc levels: Prior ACDF at the C4-5 level. There is bony fusion across the right C3-4 and C4-5 facet joints. Marked spondylosis at C5-6 and C6-7,  with left greater than right neural foraminal encroachment. Upper chest: Airway is patent.  Lung apices are clear. Other: Reconstructed images demonstrate no additional findings. IMPRESSION: 1. No acute cervical spine fracture. 2. Prior C4-5 ACDF, with multilevel spondylosis and facet hypertrophy as above. Electronically Signed   By: Sharlet Salina M.D.   On: 07/21/2022 18:02   CT Head Wo Contrast  Result Date: 07/21/2022 CLINICAL DATA:  [place], loss of consciousness EXAM: CT HEAD WITHOUT CONTRAST TECHNIQUE: Contiguous axial images were obtained from the base of the skull through the vertex without intravenous contrast. RADIATION DOSE REDUCTION: This exam was performed according to the departmental dose-optimization program which includes automated exposure control, adjustment of the mA and/or kV according to patient size and/or use of iterative reconstruction technique. COMPARISON:  06/17/2022 FINDINGS: Brain: No acute infarct or hemorrhage. Lateral ventricles and midline structures are unremarkable. No acute extra-axial fluid collections. No mass effect. Vascular: No hyperdense vessel or unexpected calcification. Skull:  Normal. Negative for fracture or focal lesion. Sinuses/Orbits: No acute finding. Other: None. IMPRESSION: 1. Stable head CT, no acute intracranial process. Electronically Signed   By: Sharlet Salina M.D.   On: 07/21/2022 17:59   DG Ribs Unilateral W/Chest Left  Result Date: 07/21/2022 CLINICAL DATA:  Multiple falls today with left-sided chest pain, initial encounter EXAM: LEFT RIBS AND CHEST - 3+ VIEW COMPARISON:  05/21/2022 FINDINGS: Check shadow is within normal limits. Aortic calcifications are seen. The lungs are hypoinflated. No infiltrate, effusion or pneumothorax is seen. Undisplaced posterolateral left eighth rib fracture is seen. No other fractures are seen. IMPRESSION: Left eighth rib fracture as described without complicating factors. Electronically Signed   By: Alcide Clever M.D.   On: 07/21/2022 17:59   NCV with EMG(electromyography)  Result Date: 07/14/2022 Glendale Chard, DO     07/14/2022  3:50 PM [facility] [address] [place], [address]  [place], [place] [address] Tel: [phone] Fax: [phone] Test Date:  07/14/2022 Patient: Alan Lin DOB: 1955/08/01 Physician: Nita Sickle, DO Sex: Male Height: 5\' 10"  Ref Phys: , DO ID#: Nita Sickle   Technician:  History: This is a 67 year old man referred for evaluation of gait instability. NCV & EMG Findings: Extensive electrodiagnostic testing of the right lower extremity and additional studies of the left shows: Bilateral sural and superficial peroneal sensory responses are absent. Bilateral peroneal and tibial motor responses are within normal limits. Chronic motor axon loss changes are seen affecting the L5-S1 myotomes bilaterally, without accompanying active denervation. 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 There is also a superimposed chronic L5-S1 radiculopathy affecting bilateral lower extremities.  ___________________________ 71, DO Nerve Conduction Studies  Stim Site NR Peak (ms) Norm Peak (ms) O-P Amp (V) Norm O-P Amp Left Sup Peroneal Anti Sensory (Ant Lat Mall)  32 C 12 cm *NR  <4.6  >3 Right Sup Peroneal Anti Sensory (Ant Lat Mall)  32 C 12 cm *NR  <4.6  >3 Left Sural Anti Sensory (Lat Mall)  32 C Calf *NR  <4.6  >3 Right Sural Anti Sensory (Lat Mall)  32 C Calf *NR  <4.6  >3  Stim Site NR Onset (ms) Norm Onset (ms) O-P Amp (mV) Norm O-P Amp Site1 Site2 Delta-0 (ms) Dist (cm) Vel (m/s) Norm Vel (m/s) Left Peroneal Motor (Ext Dig Brev)  32 C Ankle    4.3 <6.0 3.1 >2.5 B Fib Ankle 7.8 35.0 45 >40 B Fib  12.1  2.9  Poplt B Fib 2.0 8.0 40 >40 Poplt    14.1  2.6        Right Peroneal Motor (Ext Dig Brev)  32 C Ankle    4.1 <6.0 3.3 >2.5 B Fib Ankle 8.1 36.0 44 >40 B Fib    12.2  2.5  Poplt B Fib 1.9 8.0 42 >40 Poplt    14.1  2.2        Left Tibial Motor (Abd Hall Brev)  32 C Ankle    4.2 <6.0 7.7 >4 Knee Ankle 8.6 42.0 49 >40 Knee    12.8  7.7        Right Tibial Motor (Abd Hall Brev)  32 C Ankle    4.6 <6.0 4.6 >4 Knee Ankle 9.2 42.0 46 >40 Knee    13.8  3.6        Electromyography  Side Muscle Ins.Act Fibs Fasc Recrt Amp Dur Poly Activation Comment Right AntTibialis Nml Nml Nml *1- *1+ *1+ *1+ Nml N/A Right Gastroc Nml Nml Nml *1- *1+ *1+ *1+ Nml N/A Right Flex Dig Long Nml Nml Nml *2- *1+ *1+ *1+ Nml N/A Right RectFemoris Nml Nml Nml Nml Nml Nml Nml Nml N/A Right BicepsFemS Nml Nml Nml *1- *1+ *1+ *1+ Nml N/A Right GluteusMed Nml Nml Nml Nml Nml Nml Nml Nml N/A Left Flex Dig Long Nml Nml Nml *1- *1+ *1+ *1+ Nml N/A Left Gastroc Nml Nml Nml *1- *1+ *1+ *1+ Nml N/A Left AntTibialis Nml Nml Nml *1- *1+ *1+ *1+ Nml N/A Left RectFemoris Nml Nml Nml Nml Nml Nml Nml Nml N/A Waveforms:                   Subjective: No significant events overnight, he denies any complaints today  Discharge Exam: Vitals:   07/28/22 0744 07/28/22 0857  BP:  (!) 179/77  Pulse:  79  Resp:    Temp:     SpO2: 98%    Vitals:   07/28/22 0049 07/28/22 0456 07/28/22 0744 07/28/22 0857  BP: (!) 146/70 (!) 165/89  (!) 179/77  Pulse: 65 61  79  Resp: 18 18    Temp: (!) 97.5 F (36.4 C) 97.7 F (36.5 C)    TempSrc: Axillary Oral    SpO2: 96%  98%   Weight:      Height:        General: Pt is alert, awake, not in acute distress Cardiovascular: RRR, S1/S2 +, no rubs, no gallops Respiratory: CTA bilaterally, no wheezing, no rhonchi Abdominal: Soft, NT, ND, bowel sounds + Extremities: no edema, no cyanosis    The results of significant diagnostics from this hospitalization (including imaging, microbiology, ancillary and laboratory) are listed below for reference.     Microbiology: Recent Results (from the past 240 hour(s))  Resp panel by RT-PCR (RSV, Flu A&B, Covid) Anterior Nasal Swab     Status: None   Collection Time: 07/21/22  4:26 PM   Specimen: Anterior Nasal Swab  Result Value Ref Range Status   SARS Coronavirus 2 by RT PCR NEGATIVE NEGATIVE Final   Influenza A by PCR NEGATIVE NEGATIVE Final   Influenza B by PCR NEGATIVE NEGATIVE Final    Comment: (NOTE) The Xpert Xpress SARS-CoV-2/FLU/RSV plus assay is intended as an aid in the diagnosis of influenza from Nasopharyngeal swab specimens and should not be used as a sole basis for treatment. Nasal washings and aspirates are unacceptable for Xpert Xpress SARS-CoV-2/FLU/RSV testing.  Fact Sheet for Patients: EntrepreneurPulse.com.au  Fact Sheet for Healthcare Providers: IncredibleEmployment.be  This test is not yet approved or cleared by the [place] FDA and has been authorized for detection and/or diagnosis of SARS-CoV-2 by FDA under an Emergency Use Authorization (EUA). This EUA will remain in effect (meaning this test can be used) for the duration of the COVID-19 declaration under Section 564(b)(1) of the Act, 21 U.S.C. section 360bbb-3(b)(1), unless the authorization is  terminated or revoked.     Resp Syncytial Virus by PCR NEGATIVE NEGATIVE Final    Comment: (NOTE) Fact Sheet for Patients: EntrepreneurPulse.com.au  Fact Sheet for Healthcare Providers: IncredibleEmployment.be  This test is not yet approved or cleared by the [place] FDA and has been authorized for detection and/or diagnosis of SARS-CoV-2 by FDA under an Emergency Use Authorization (EUA). This EUA will remain in effect (meaning this test can be used) for the duration of the COVID-19 declaration under Section 564(b)(1) of the Act, 21 U.S.C. section 360bbb-3(b)(1), unless the authorization is terminated or revoked.  Performed at [facility], [place] [address], [place], [place] [address]      Labs: BNP (last 3 results) Recent Labs    07/24/22 0308 07/25/22 0214 07/26/22 0506  BNP 86.8 182.0* [id number]*   Basic Metabolic Panel: Recent Labs  Lab 07/21/22 1710 07/22/22 0135 07/22/22 0702 07/23/22 0334 07/24/22 0308 07/25/22 0214 07/26/22 0506 07/27/22 0614 07/28/22 0229  NA 134*   < > 137 136 133* 131* 130* 133* 133*  K 2.7*   < > 3.6 3.0* 3.7 3.5 3.4* 3.1* 3.3*  CL 102   < > 101 98 100 96* 93* 97* 96*  CO2 19*   < > 28 24 25 26 28 26 28   GLUCOSE 280*   < > 166* 234* 188* 162* 186* 163* 179*  BUN 24*   < > 22 15 16 13 14 10 8   CREATININE 0.82   < > 0.97 0.85 0.74 0.79 0.88 0.77 0.85  CALCIUM 8.4*   < > 9.0 9.6 8.9 9.0 8.8* 8.6* 9.0  MG 1.6*  --  2.1 1.8 2.1 1.8 1.8  --   --   PHOS 1.4*  --  3.6  --   --   --   --   --   --    < > = values in this interval not displayed.   Liver Function Tests: Recent Labs  Lab 07/23/22 0334 07/24/22 0308 07/25/22 0214 07/26/22 0506 07/27/22 0614  AST 30 14* 17 13* 16  ALT 37 28 26 24 22   ALKPHOS 50 48 48 48 45  BILITOT 1.1 1.2 1.0 1.0 1.0  PROT 6.1* 5.6* 5.5* 5.1* 5.2*  ALBUMIN 3.4* 3.1* 3.0* 2.7* 2.7*   No results for input(s): "LIPASE", "AMYLASE" in the last 168 hours. No  results for input(s): "AMMONIA" in the last 168 hours. CBC: Recent Labs  Lab 07/23/22 0334 07/24/22 0308 07/25/22 0214 07/26/22 0506 07/27/22 0614 07/28/22 0229  WBC 11.7* 14.7* 13.9* 11.6* 10.1 10.5  NEUTROABS 9.6* 11.0* 10.3* 8.1* 7.2  --   HGB 16.0 14.7 14.8 14.5 14.5 15.2  HCT 46.1 42.5 44.6 41.6 41.4 45.3  MCV 83.1 83.7 85.9 82.9 82.1 84.0  PLT 254 269 273 246 264 287   Cardiac Enzymes: Recent Labs  Lab 07/21/22 1710  CKTOTAL 49   BNP: Invalid input(s): "POCBNP" CBG: Recent Labs  Lab 07/27/22 1646 07/27/22 2011 07/28/22 0045 07/28/22 0409 07/28/22 0815  GLUCAP 195* 226*  187* 177* 165*   D-Dimer No results for input(s): "DDIMER" in the last 72 hours. Hgb A1c No results for input(s): "HGBA1C" in the last 72 hours. Lipid Profile No results for input(s): "CHOL", "HDL", "LDLCALC", "TRIG", "CHOLHDL", "LDLDIRECT" in the last 72 hours. Thyroid function studies No results for input(s): "TSH", "T4TOTAL", "T3FREE", "THYROIDAB" in the last 72 hours.  Invalid input(s): "FREET3" Anemia work up No results for input(s): "VITAMINB12", "FOLATE", "FERRITIN", "TIBC", "IRON", "RETICCTPCT" in the last 72 hours. Urinalysis    Component Value Date/Time   COLORURINE YELLOW 07/22/2022 0830   APPEARANCEUR CLEAR 07/22/2022 0830   LABSPEC 1.036 (H) 07/22/2022 0830   PHURINE 6.0 07/22/2022 0830   GLUCOSEU >=500 (A) 07/22/2022 0830   HGBUR NEGATIVE 07/22/2022 0830   BILIRUBINUR NEGATIVE 07/22/2022 0830   KETONESUR NEGATIVE 07/22/2022 0830   PROTEINUR NEGATIVE 07/22/2022 0830   NITRITE NEGATIVE 07/22/2022 0830   LEUKOCYTESUR NEGATIVE 07/22/2022 0830   Sepsis Labs Recent Labs  Lab 07/25/22 0214 07/26/22 0506 07/27/22 0614 07/28/22 0229  WBC 13.9* 11.6* 10.1 10.5   Microbiology Recent Results (from the past 240 hour(s))  Resp panel by RT-PCR (RSV, Flu A&B, Covid) Anterior Nasal Swab     Status: None   Collection Time: 07/21/22  4:26 PM   Specimen: Anterior Nasal Swab   Result Value Ref Range Status   SARS Coronavirus 2 by RT PCR NEGATIVE NEGATIVE Final   Influenza A by PCR NEGATIVE NEGATIVE Final   Influenza B by PCR NEGATIVE NEGATIVE Final    Comment: (NOTE) The Xpert Xpress SARS-CoV-2/FLU/RSV plus assay is intended as an aid in the diagnosis of influenza from Nasopharyngeal swab specimens and should not be used as a sole basis for treatment. Nasal washings and aspirates are unacceptable for Xpert Xpress SARS-CoV-2/FLU/RSV testing.  Fact Sheet for Patients: EntrepreneurPulse.com.au  Fact Sheet for Healthcare Providers: IncredibleEmployment.be  This test is not yet approved or cleared by the [place] FDA and has been authorized for detection and/or diagnosis of SARS-CoV-2 by FDA under an Emergency Use Authorization (EUA). This EUA will remain in effect (meaning this test can be used) for the duration of the COVID-19 declaration under Section 564(b)(1) of the Act, 21 U.S.C. section 360bbb-3(b)(1), unless the authorization is terminated or revoked.     Resp Syncytial Virus by PCR NEGATIVE NEGATIVE Final    Comment: (NOTE) Fact Sheet for Patients: EntrepreneurPulse.com.au  Fact Sheet for Healthcare Providers: IncredibleEmployment.be  This test is not yet approved or cleared by the [place] FDA and has been authorized for detection and/or diagnosis of SARS-CoV-2 by FDA under an Emergency Use Authorization (EUA). This EUA will remain in effect (meaning this test can be used) for the duration of the COVID-19 declaration under Section 564(b)(1) of the Act, 21 U.S.C. section 360bbb-3(b)(1), unless the authorization is terminated or revoked.  Performed at [facility], [place] [address] [place], [place] [address]      Time coordinating discharge: Over 30 minutes  SIGNED:   Phillips Climes, MD  [facility] 07/28/2022, 10:30 AM Pager   If  7PM-7AM, please contact night-coverage [email or web address]

## 2022-07-29 ENCOUNTER — Encounter: Payer: Medicare Other | Admitting: Neurology

## 2022-07-29 NOTE — Telephone Encounter (Signed)
Called patient no answer left a vm to call back

## 2022-07-30 ENCOUNTER — Encounter (HOSPITAL_COMMUNITY): Payer: Self-pay | Admitting: Internal Medicine

## 2022-08-01 NOTE — Telephone Encounter (Signed)
Location of hospitalization: [place] Reason for hospitalization: Dizziness Date of discharge: 07/28/2022 Date of first communication with patient: today Person contacting patient: Me Current symptoms: Constipation  Do you understand why you were in the Hospital: Yes Questions regarding discharge instructions: None Where were you discharged to: Home Medications reviewed: Yes Allergies reviewed: Yes Dietary changes reviewed: Yes. Discussed low fat and low salt diet.  Referals reviewed: NA Activities of Daily Living: Able to with mild limitations Any transportation issues/concerns: None Any patient concerns: None Confirmed importance & date/time of Follow up appt: Yes Confirmed with patient if condition begins to worsen call. Pt was given the office number and encouraged to call back with questions or concerns: Yes

## 2022-08-03 ENCOUNTER — Ambulatory Visit: Payer: Medicare Other | Admitting: Internal Medicine

## 2022-08-10 ENCOUNTER — Ambulatory Visit: Payer: Medicare Other | Admitting: Internal Medicine

## 2022-08-10 ENCOUNTER — Encounter: Payer: Self-pay | Admitting: Internal Medicine

## 2022-08-10 VITALS — BP 130/79 | HR 97 | Ht 70.0 in | Wt 255.0 lb

## 2022-08-10 DIAGNOSIS — I480 Paroxysmal atrial fibrillation: Secondary | ICD-10-CM

## 2022-08-10 DIAGNOSIS — R55 Syncope and collapse: Secondary | ICD-10-CM

## 2022-08-10 DIAGNOSIS — I10 Essential (primary) hypertension: Secondary | ICD-10-CM

## 2022-08-10 DIAGNOSIS — E119 Type 2 diabetes mellitus without complications: Secondary | ICD-10-CM

## 2022-08-10 NOTE — Progress Notes (Signed)
Primary Physician/Referring:  Alan Harps, MD  Patient ID: Alan Lin, male    DOB: 12/19/1955, 67 y.o.   MRN: [id number]  Chief Complaint  Patient presents with   Loss of Consciousness   Hospitalization Follow-up        HPI:    [name]  is a 67 y.o. male with diabetes, PAF, and HTN who is here to establish care with cardiology after an inpatient hospital stay for new atrial fibrillation. He presented to the hospital after a fall where he suffered multiple rib fractures and he has been in rehab since discharge from the hospital. He is still in a decent amount of pain from his rib fractures but he is managing pretty well despite this. During his hospital stay, I had him on amiodarone drip and plan was for TEE with DCCV but patient then converted to NSR prior to the procedure. He is tolerating PO amiodarone without issues. Other than his rib pain, he denies chest pain, shortness of breath, palpitations.   Past Medical History:  Diagnosis Date   Arthritis    Diabetes mellitus without complication ([place])    Hypertension    Sleep apnea    No past surgical history on file. Family History  Problem Relation Age of Onset   Dementia Mother    Alzheimer's disease Mother    Kidney Stones Father     Social History   Tobacco Use   Smoking status: Never   Smokeless tobacco: Never  Substance Use Topics   Alcohol use: Never   Marital Status: Single  ROS  Review of Systems  Musculoskeletal:  Positive for joint pain.   Objective  Blood pressure 130/79, pulse 97, height '5\' 10"'$  (1.778 m), weight 255 lb (115.7 kg), SpO2 95 %. Body mass index is 36.59 kg/m.     08/10/2022    3:13 PM 07/28/2022    8:57 AM 07/28/2022    4:56 AM  Vitals with BMI  Height '5\' 10"'$     Weight 255 lbs    BMI [id number]    Systolic [id number]  Diastolic 79 77 89  Pulse 97 79 61     Physical Exam Vitals reviewed.  HENT:     Head: Normocephalic and atraumatic.  Cardiovascular:     Rate and Rhythm:  Normal rate and regular rhythm.     Heart sounds: Normal heart sounds. No murmur heard. Pulmonary:     Effort: Pulmonary effort is normal.  Abdominal:     General: Bowel sounds are normal.  Musculoskeletal:     Right lower leg: No edema.     Left lower leg: No edema.  Skin:    General: Skin is warm and dry.  Neurological:     Mental Status: He is alert.     Medications and allergies   Allergies  Allergen Reactions   Metformin Diarrhea and Nausea And Vomiting   Oxycodone Other (See Comments)    Constipation   Oxycodone-Acetaminophen     Constipation   Lisinopril Cough   Morphine Itching    Other reaction(s): Other (See Comments)  Constipation   Morphine And Related Itching     Medication list after today's encounter   Current Outpatient Medications:    acetaminophen (TYLENOL) 325 MG tablet, Take 1 tablet (325 mg total) by mouth every 6 (six) hours as needed for mild pain (or Fever >/= 101)., Disp: , Rfl:    amiodarone (PACERONE) 200 MG tablet, Amio 400 mg BID  for 5 days. Then decrease to  200 mg BID for 1 week , then 200 mg qDay thereafter., Disp: , Rfl:    apixaban (ELIQUIS) 5 MG TABS tablet, Take 1 tablet (5 mg total) by mouth 2 (two) times daily., Disp: 60 tablet, Rfl:    cyclobenzaprine (FLEXERIL) 10 MG tablet, Take 10 mg by mouth daily as needed for muscle spasms., Disp: , Rfl:    diclofenac Sodium (VOLTAREN) 1 % GEL, Apply 2 g topically 4 (four) times daily as needed (pain)., Disp: , Rfl:    DULoxetine (CYMBALTA) 20 MG capsule, Take 60 mg by mouth daily., Disp: , Rfl:    gabapentin (NEURONTIN) 300 MG capsule, Take 300-1,500 mg by mouth daily. Take '300mg'$  (1 capsule) by mouth in the mornings and '1500mg'$  (5 capsules) in the evening., Disp: , Rfl:    JARDIANCE 10 MG TABS tablet, Take 10 mg by mouth daily., Disp: , Rfl:    losartan (COZAAR) 50 MG tablet, Take 50 mg by mouth daily., Disp: , Rfl:    metoprolol succinate (TOPROL-XL) 50 MG 24 hr tablet, Take 1 tablet (50 mg  total) by mouth 2 (two) times daily. Take with or immediately following a meal., Disp: , Rfl:    Multiple Vitamins-Minerals (MULTIVITAMIN WITH MINERALS) tablet, Take 1 tablet by mouth daily., Disp: , Rfl:    potassium chloride SA (KLOR-CON M) 20 MEQ tablet, Take 1 tablet (20 mEq total) by mouth daily. One po bid x 3 days, then one po once a day, Disp: 2 tablet, Rfl: 20   Semaglutide (RYBELSUS) 7 MG TABS, Take by mouth., Disp: , Rfl:    simvastatin (ZOCOR) 20 MG tablet, Take 20 mg by mouth daily., Disp: , Rfl:   Laboratory examination:   Lab Results  Component Value Date   NA 133 (L) 07/28/2022   K 3.3 (L) 07/28/2022   CO2 28 07/28/2022   GLUCOSE 179 (H) 07/28/2022   BUN 8 07/28/2022   CREATININE 0.85 07/28/2022   CALCIUM 9.0 07/28/2022   GFRNONAA >60 07/28/2022       Latest Ref Rng & Units 07/28/2022    2:29 AM 07/27/2022    6:14 AM 07/26/2022    5:06 AM  CMP  Glucose 70 - 99 mg/dL 179  163  186   BUN 8 - 23 mg/dL '8  10  14   '$ Creatinine 0.61 - 1.24 mg/dL 0.85  0.77  0.88   Sodium 135 - 145 mmol/L 133  133  130   Potassium 3.5 - 5.1 mmol/L 3.3  3.1  3.4   Chloride 98 - 111 mmol/L 96  97  93   CO2 22 - 32 mmol/L '28  26  28   '$ Calcium 8.9 - 10.3 mg/dL 9.0  8.6  8.8   Total Protein 6.5 - 8.1 g/dL  5.2  5.1   Total Bilirubin 0.3 - 1.2 mg/dL  1.0  1.0   Alkaline Phos 38 - 126 U/L  45  48   AST 15 - 41 U/L  16  13   ALT 0 - 44 U/L  22  24       Latest Ref Rng & Units 07/28/2022    2:29 AM 07/27/2022    6:14 AM 07/26/2022    5:06 AM  CBC  WBC 4.0 - 10.5 K/uL 10.5  10.1  11.6   Hemoglobin 13.0 - 17.0 g/dL 15.2  14.5  14.5   Hematocrit 39.0 - 52.0 % 45.3  41.4  41.6  Platelets 150 - 400 K/uL 287  264  246     Lipid Panel No results for input(s): "CHOL", "TRIG", "LDLCALC", "VLDL", "HDL", "CHOLHDL", "LDLDIRECT" in the last 8760 hours.  HEMOGLOBIN A1C Lab Results  Component Value Date   HGBA1C 8.0 (H) 07/21/2022   MPG 182.9 07/21/2022   TSH Recent Labs    07/21/22 1907  TSH  0.453    External labs:     Radiology:    Cardiac Studies:   ECHO COMPLETE WITH IMAGING ENHANCING AGENT 07/22/2022 1. Windows not well visualized. Cannot assess wall motion. Left ventricular ejection fraction, by estimation, is 60 to 65%. The left ventricle has normal function. 2. Right ventricular systolic function is normal. The right ventricular size is normal. 3. No evidence of mitral valve regurgitation. 4. Aortic valve regurgitation is not visualized. 5. The inferior vena cava is normal in size with greater than 50% respiratory variability, suggesting right atrial pressure of 3 mmHg.    EKG:   07/25/2022: Atrial fibrillation with  RVR, rate 164 bpm. 07/26/2022: normal sinus rhythm, rate 76 bpm.  Assessment     ICD-10-CM   1. Paroxysmal atrial fibrillation (HCC)  I48.0 PCV MYOCARDIAL PERFUSION WITH LEXISCAN    2. Essential hypertension  I10 PCV MYOCARDIAL PERFUSION WITH LEXISCAN    3. Syncope and collapse  R55 PCV MYOCARDIAL PERFUSION WITH LEXISCAN    4. Type 2 diabetes mellitus without complication, without long-term current use of insulin (HCC)  E11.9 PCV MYOCARDIAL PERFUSION WITH LEXISCAN       Orders Placed This Encounter  Procedures   PCV MYOCARDIAL PERFUSION WITH LEXISCAN    Standing Status:   Future    Standing Expiration Date:   10/09/2022    No orders of the defined types were placed in this encounter.   Medications Discontinued During This Encounter  Medication Reason   HYDROcodone-acetaminophen (NORCO/VICODIN) 5-325 MG tablet Completed Course   pantoprazole (PROTONIX) 40 MG tablet Completed Course     Recommendations:   ARVID DEGROOT is a 67 y.o.  male with PAF  Paroxysmal atrial fibrillation ([place]) Maintains NSR He is wearing his apple watch and I have set up the EKG app on to track his Afib burden. We also did a prelim EKG on his watch to show him how to do it and patient was in NSR at rate in high 80s. Continue Eliquis and  amiodarone   Essential hypertension Continue current cardiac medications. Encourage low-sodium diet, less than 2000 mg daily. Follow-up in 3 months or sooner if needed.   Syncope and collapse Event monitor ordered Will obtain stress test once he has healed from his rib fractures   Type 2 diabetes mellitus without complication, without long-term current use of insulin ([place]) Followed by primary     Floydene Flock, DO, [facility]  08/14/2022, 11:09 AM Office: [phone] Pager: [phone]

## 2022-08-15 ENCOUNTER — Telehealth: Payer: Self-pay

## 2022-08-15 NOTE — Telephone Encounter (Signed)
Patient wanting to transfer care from [facility].  Dr Earlean Shawl retired.  Would like to be advised by Dr Hilarie Fredrickson once we receive records.

## 2022-08-17 ENCOUNTER — Ambulatory Visit: Payer: Medicare Other | Admitting: Cardiovascular Disease

## 2022-08-29 ENCOUNTER — Ambulatory Visit: Payer: Medicare Other

## 2022-08-29 DIAGNOSIS — I480 Paroxysmal atrial fibrillation: Secondary | ICD-10-CM

## 2022-08-29 DIAGNOSIS — E119 Type 2 diabetes mellitus without complications: Secondary | ICD-10-CM

## 2022-08-29 DIAGNOSIS — R55 Syncope and collapse: Secondary | ICD-10-CM

## 2022-08-29 DIAGNOSIS — I10 Essential (primary) hypertension: Secondary | ICD-10-CM

## 2022-09-04 LAB — PCV MYOCARDIAL PERFUSION WITH LEXISCAN: ST Depression (mm): 0 mm

## 2022-09-06 ENCOUNTER — Ambulatory Visit: Payer: Medicare Other | Admitting: Neurology

## 2022-09-07 NOTE — Progress Notes (Signed)
Please schedule this pt with me sooner

## 2022-09-08 ENCOUNTER — Emergency Department (HOSPITAL_COMMUNITY)
Admission: EM | Admit: 2022-09-08 | Discharge: 2022-09-08 | Disposition: A | Discharge status: Home / Self Care | Payer: Medicare Other | Attending: Emergency Medicine | Admitting: Emergency Medicine

## 2022-09-08 ENCOUNTER — Other Ambulatory Visit: Payer: Self-pay

## 2022-09-08 ENCOUNTER — Emergency Department (HOSPITAL_COMMUNITY): Payer: Medicare Other

## 2022-09-08 ENCOUNTER — Encounter (HOSPITAL_COMMUNITY): Payer: Self-pay

## 2022-09-08 DIAGNOSIS — S2242XA Multiple fractures of ribs, left side, initial encounter for closed fracture: Secondary | ICD-10-CM | POA: Diagnosis not present

## 2022-09-08 DIAGNOSIS — Y92009 Unspecified place in unspecified non-institutional (private) residence as the place of occurrence of the external cause: Secondary | ICD-10-CM | POA: Insufficient documentation

## 2022-09-08 DIAGNOSIS — W010XXA Fall on same level from slipping, tripping and stumbling without subsequent striking against object, initial encounter: Secondary | ICD-10-CM | POA: Insufficient documentation

## 2022-09-08 DIAGNOSIS — Z7901 Long term (current) use of anticoagulants: Secondary | ICD-10-CM | POA: Diagnosis not present

## 2022-09-08 DIAGNOSIS — I10 Essential (primary) hypertension: Secondary | ICD-10-CM | POA: Insufficient documentation

## 2022-09-08 DIAGNOSIS — S299XXA Unspecified injury of thorax, initial encounter: Secondary | ICD-10-CM | POA: Diagnosis present

## 2022-09-08 DIAGNOSIS — E119 Type 2 diabetes mellitus without complications: Secondary | ICD-10-CM | POA: Diagnosis not present

## 2022-09-08 DIAGNOSIS — S20212A Contusion of left front wall of thorax, initial encounter: Secondary | ICD-10-CM

## 2022-09-08 DIAGNOSIS — Z79899 Other long term (current) drug therapy: Secondary | ICD-10-CM | POA: Diagnosis not present

## 2022-09-08 DIAGNOSIS — W19XXXA Unspecified fall, initial encounter: Secondary | ICD-10-CM

## 2022-09-08 MED ORDER — ACETAMINOPHEN 500 MG PO TABS
1000.0000 mg | ORAL_TABLET | Freq: Once | ORAL | Status: AC
  Administered 2022-09-08: 1000 mg via ORAL
  Filled 2022-09-08: qty 2

## 2022-09-08 MED ORDER — FENTANYL 50 MCG/HR TD PT72: 1.0000 | MEDICATED_PATCH | TRANSDERMAL | 0 refills | Status: DC

## 2022-09-08 MED ORDER — HYDROCODONE-ACETAMINOPHEN 5-325 MG PO TABS
1.0000 | ORAL_TABLET | Freq: Once | ORAL | Status: DC
  Filled 2022-09-08: qty 1

## 2022-09-08 NOTE — ED Notes (Signed)
Discharge instructions discussed with pt. Verbalized understanding. VSS. No questions or concerns regarding discharge  

## 2022-09-08 NOTE — ED Notes (Signed)
Patient transported to X-ray 

## 2022-09-08 NOTE — ED Notes (Signed)
(  Recent fall per pt 1 month ago, dx with left rib fx 7-9, fell last night, declined coming, since fall, increasing pain to left ribs and left shoulder, uses walker at baseline, from [place])

## 2022-09-08 NOTE — ED Notes (Signed)
Back from X/R.

## 2022-09-08 NOTE — Discharge Instructions (Addendum)
You are seen today and likely reinjured your known rib fractures.  You will be sent with fentanyl patch.  You may continue Tylenol.  Use your incentive spirometer.

## 2022-09-08 NOTE — ED Provider Notes (Signed)
[facility] Provider Note   CSN: [id number] Arrival date & time: 09/08/22  [id number]     History  Chief Complaint  Patient presents with   Alan Lin is a 67 y.o. male.  HPI     This is a 67 year old male who presents following a fall.  Patient reports that just over 1 month ago he fell injuring his left ribs.  He had rib fractures of the seventh, eighth, and ninth ribs.  He states he had been healing well and had minimal pain.  He was at his house earlier today when he tripped in the driveway falling back on his left side.  Initially he thought that he was okay but woke up this morning around 2 AM with sharp left-sided rib pain.  No shortness of breath.  He is on Eliquis.  He is now living at [facility] assisted living but does still own at home and that is where he fell earlier today.  Denies hitting his head or loss of consciousness.  Only complaining of left rib pain.  Home Medications Prior to Admission medications   Medication Sig Start Date End Date Taking? Authorizing Provider  fentaNYL (DURAGESIC) 50 MCG/HR Place 1 patch onto the skin every 3 (three) days. 09/08/22  Yes Octavie Westerhold, Barbette Hair, MD  acetaminophen (TYLENOL) 325 MG tablet Take 1 tablet (325 mg total) by mouth every 6 (six) hours as needed for mild pain (or Fever >/= 101). 07/28/22   Elgergawy, Silver Huguenin, MD  amiodarone (PACERONE) 200 MG tablet Amio 400 mg BID for 5 days. Then decrease to  200 mg BID for 1 week , then 200 mg qDay thereafter. 07/28/22   Elgergawy, Silver Huguenin, MD  apixaban (ELIQUIS) 5 MG TABS tablet Take 1 tablet (5 mg total) by mouth 2 (two) times daily. 07/28/22   Elgergawy, Silver Huguenin, MD  cyclobenzaprine (FLEXERIL) 10 MG tablet Take 10 mg by mouth daily as needed for muscle spasms.    [provider]  diclofenac Sodium (VOLTAREN) 1 % GEL Apply 2 g topically 4 (four) times daily as needed (pain). 09/20/19   [provider]  DULoxetine  (CYMBALTA) 20 MG capsule Take 60 mg by mouth daily. 08/28/19   [provider]  gabapentin (NEURONTIN) 300 MG capsule Take 300-1,500 mg by mouth daily. Take 300mg  (1 capsule) by mouth in the mornings and 1500mg  (5 capsules) in the evening. 08/03/16   [provider]  JARDIANCE 10 MG TABS tablet Take 10 mg by mouth daily. 08/09/22   [provider]  losartan (COZAAR) 50 MG tablet Take 50 mg by mouth daily. 07/14/16   [provider]  metoprolol succinate (TOPROL-XL) 50 MG 24 hr tablet Take 1 tablet (50 mg total) by mouth 2 (two) times daily. Take with or immediately following a meal. 07/28/22   Elgergawy, Silver Huguenin, MD  Multiple Vitamins-Minerals (MULTIVITAMIN WITH MINERALS) tablet Take 1 tablet by mouth daily.    [provider]  potassium chloride SA (KLOR-CON M) 20 MEQ tablet Take 1 tablet (20 mEq total) by mouth daily. One po bid x 3 days, then one po once a day 07/28/22   Elgergawy, Silver Huguenin, MD  Semaglutide (RYBELSUS) 7 MG TABS Take by mouth.    [provider]  simvastatin (ZOCOR) 20 MG tablet Take 20 mg by mouth daily. 07/07/16   [provider]      Allergies    Metformin, Oxycodone,  Oxycodone-acetaminophen, Lisinopril, Morphine, and Morphine and related    Review of Systems   Review of Systems  Constitutional:  Negative for fever.  Respiratory:  Negative for shortness of breath.   Cardiovascular:  Positive for chest pain.  All other systems reviewed and are negative.   Physical Exam Updated Vital Signs BP (!) 110/58   Pulse 95   Temp 97.9 F (36.6 C) (Oral)   Resp 16   Ht 1.778 m (5\' 10" )   Wt 115.6 kg   SpO2 91%   BMI 36.57 kg/m  Physical Exam Vitals and nursing note reviewed.  Constitutional:      Appearance: He is well-developed. He is obese. He is not ill-appearing.  HENT:     Head: Normocephalic and atraumatic.  Eyes:     Pupils: Pupils are equal, round, and reactive to light.  Cardiovascular:     Rate and  Rhythm: Normal rate and regular rhythm.     Heart sounds: Normal heart sounds. No murmur heard. Pulmonary:     Effort: Pulmonary effort is normal. No respiratory distress.     Breath sounds: Rales present. No wheezing.     Comments: Left chest wall tenderness to palpation, no overlying skin changes or crepitus noted Chest:     Chest wall: Tenderness present.  Abdominal:     General: Bowel sounds are normal.     Palpations: Abdomen is soft.     Tenderness: There is no abdominal tenderness. There is no rebound.  Musculoskeletal:     Cervical back: Neck supple.  Skin:    General: Skin is warm and dry.  Neurological:     Mental Status: He is alert and oriented to person, place, and time.  Psychiatric:        Mood and Affect: Mood normal.     ED Results / Procedures / Treatments   Labs (all labs ordered are listed, but only abnormal results are displayed) Labs Reviewed - No data to display  EKG None  Radiology DG Ribs Unilateral W/Chest Left  Result Date: 09/08/2022 CLINICAL DATA:  67 year old male with history of trauma from a fall complaining of rib pain. EXAM: LEFT RIBS AND CHEST - 3+ VIEW COMPARISON:  Chest x-ray 07/25/2022. FINDINGS: Multiple views of the left ribs demonstrate mildly displaced fractures of the posterolateral left sixth and seventh ribs, and nondisplaced fracture of the posterolateral left eighth rib, similar to prior study from 07/25/2022. Lung volumes are low. No consolidative airspace disease. No pleural effusions. No pneumothorax. No pulmonary nodule or mass noted. Pulmonary vasculature and the cardiomediastinal silhouette are within normal limits. Atherosclerotic calcifications in the thoracic aorta. Orthopedic fixation hardware in the lower cervical spine incidentally noted. IMPRESSION: 1. Multiple posterolateral left-sided rib fractures, similar to prior examination. 2. Low lung volumes without radiographic evidence of acute cardiopulmonary disease.  Specifically, no pneumothorax. 3. Aortic atherosclerosis. Electronically Signed   By: Vinnie Langton M.D.   On: 09/08/2022 05:32    Procedures Procedures    Medications Ordered in ED Medications  HYDROcodone-acetaminophen (NORCO/VICODIN) 5-325 MG per tablet 1 tablet (1 tablet Oral Not Given 09/08/22 0431)  acetaminophen (TYLENOL) tablet 1,000 mg (1,000 mg Oral Given 09/08/22 0557)    ED Course/ Medical Decision Making/ A&P                             Medical Decision Making Amount and/or Complexity of Data Reviewed Radiology: ordered.  Risk OTC drugs. Prescription drug  management.   This patient presents to the ED for concern of left rib pain, this involves an extensive number of treatment options, and is a complaint that carries with it a high risk of complications and morbidity.  I considered the following differential and admission for this acute, potentially life threatening condition.  The differential diagnosis includes contusion, reinjury of known rib fractures, new rib fractures, pneumothorax, pneumonia  MDM:    This is a 67 year old male who presents with recurrent left rib pain.  Fairly recent history of known rib fractures.  He had a recurrent fall today and had worsening pain.  Has been well-controlled.  Denies significant shortness of breath.  He is in no respiratory distress.  He has no overlying skin changes.  He is on blood thinners.  X-rays obtained and show no known prior rib fractures.  No pneumothorax.  No new rib fractures noted on x-ray.  Patient does not tolerate oral opiates secondary to constipation.  He has previously tolerated fentanyl patches.  Will give a short course of fentanyl patch in addition to Tylenol at home for pain as I suspect he has reinjured known rib fractures.  (Labs, imaging, consults)  Labs: I Ordered, and personally interpreted labs.  The pertinent results include: N/A  Imaging Studies ordered: I ordered imaging studies including  x-rays left ribs I independently visualized and interpreted imaging. I agree with the radiologist interpretation  Additional history obtained from chart review.  External records from outside source obtained and reviewed including mission for rib fractures  Cardiac Monitoring: The patient was maintained on a cardiac monitor.  If on the cardiac monitor, I personally viewed and interpreted the cardiac monitored which showed an underlying rhythm of: Sinus rhythm  Reevaluation: After the interventions noted above, I reevaluated the patient and found that they have :improved  Social Determinants of Health:  lives independently at assisted living  Disposition: Discharge  Co morbidities that complicate the patient evaluation  Past Medical History:  Diagnosis Date   Arthritis    Diabetes mellitus without complication ([place])    Hypertension    Sleep apnea      Medicines Meds ordered this encounter  Medications   HYDROcodone-acetaminophen (NORCO/VICODIN) 5-325 MG per tablet 1 tablet   acetaminophen (TYLENOL) tablet 1,000 mg   fentaNYL (DURAGESIC) 50 MCG/HR    Sig: Place 1 patch onto the skin every 3 (three) days.    Dispense:  5 patch    Refill:  0    I have reviewed the patients home medicines and have made adjustments as needed  Problem List / ED Course: Problem List Items Addressed This Visit       Musculoskeletal and Integument   Rib fracture     Other   Falls - Primary   Other Visit Diagnoses     Contusion of rib on left side, initial encounter                       Final Clinical Impression(s) / ED Diagnoses Final diagnoses:  Fall, initial encounter  Contusion of rib on left side, initial encounter  Closed fracture of multiple ribs of left side, initial encounter    Rx / DC Orders ED Discharge Orders          Ordered    fentaNYL (DURAGESIC) 50 MCG/HR  every 72 hours        09/08/22 0650              Elma Shands,  Barbette Hair, MD 09/08/22  [phone]

## 2022-09-12 ENCOUNTER — Telehealth: Payer: Medicare Other | Admitting: Internal Medicine

## 2022-09-12 ENCOUNTER — Ambulatory Visit: Payer: Medicare Other | Attending: Cardiovascular Disease | Admitting: Cardiovascular Disease

## 2022-09-12 ENCOUNTER — Encounter: Payer: Self-pay | Admitting: Cardiovascular Disease

## 2022-09-12 VITALS — BP 134/84 | HR 92 | Ht 70.5 in | Wt 245.0 lb

## 2022-09-12 DIAGNOSIS — D6869 Other thrombophilia: Secondary | ICD-10-CM | POA: Diagnosis not present

## 2022-09-12 DIAGNOSIS — G4733 Obstructive sleep apnea (adult) (pediatric): Secondary | ICD-10-CM | POA: Insufficient documentation

## 2022-09-12 DIAGNOSIS — E119 Type 2 diabetes mellitus without complications: Secondary | ICD-10-CM | POA: Diagnosis present

## 2022-09-12 DIAGNOSIS — I480 Paroxysmal atrial fibrillation: Secondary | ICD-10-CM | POA: Diagnosis not present

## 2022-09-12 DIAGNOSIS — I10 Essential (primary) hypertension: Secondary | ICD-10-CM | POA: Insufficient documentation

## 2022-09-12 DIAGNOSIS — E668 Other obesity: Secondary | ICD-10-CM | POA: Diagnosis present

## 2022-09-12 NOTE — Progress Notes (Signed)
Cardiology Office Note:    Date:  09/14/2022   ID:  Alan Lin, DOB 24-Mar-1956, MRN [id number]  PCP:  Esmond Harps, MD   [place] Providers Cardiologist:  None     Referring MD: Esmond Harps, MD   Chief Complaint  Patient presents with   Atrial Fibrillation    History of Present Illness:    Alan Lin is a 67 y.o. male with a hx of paroxysmal atrial fibrillation, type 2 diabetes mellitus, hypertension, obstructive sleep apnea, obesity, who was referred to the cardiology clinic after an emergency room visit for a fall that occurred on 09/08/2022.  He is actually already a patient of Dr. Floydene Flock, but I think the emergency room referred him to our practice inadvertently.  He had a fall about in early February and had 3 fractured ribs on the left side.  It is possible that he lost consciousness at that time.  He was hospitalized at [facility] subsequently at the [place] rehab facility and is currently in assisted living at [facility].  During that hospitalization he was evaluated by Dr. Shellia Carwin for atrial fibrillation rapid ventricular rates up to 164 bpm.  He was treated with intravenous amiodarone and converted to p.o. amiodarone at discharge.  His echo showed normal LV function with EF 60-65% as well as normal right ventricular function.  There was no significant valvular abnormality.  There was a plan for TEE with cardioversion but he converted to sinus rhythm spontaneously before that procedure was performed.  He is still on amiodarone 200 mg daily.  He is on anticoagulation with Eliquis 5 mg twice daily.  He also takes chlorthalidone losartan and metoprolol as well as Jardiance for his diabetes.  As an outpatient he underwent a nuclear stress test with a small inferolateral reversible defect.  The EF is reported at 61% at the beginning or the report but 41% in the end of the report.  He has worn an arrhythmia monitor but results are not  available.  He had a mechanical fall again  on 09/08/2022 (he remembers tripping, he did not lose consciousness), but did not have head impact.  He was evaluated in the emergency room.  The x-rays showed the known prior rib fractures but no new fractures or pneumothorax.  Pain medicines were prescribed.  An EKG was not performed.  On physical exam he had "normal rate and regular rhythm".  He reports that his smart watch has not shown any recent evidence of atrial fibrillation.  Past Medical History:  Diagnosis Date   Arthritis    Diabetes mellitus without complication ([place])    Hypertension    Sleep apnea     History reviewed. No pertinent surgical history.  Current Medications: Current Meds  Medication Sig   acetaminophen (TYLENOL) 325 MG tablet Take 1 tablet (325 mg total) by mouth every 6 (six) hours as needed for mild pain (or Fever >/= 101).   amiodarone (PACERONE) 200 MG tablet Amio 400 mg BID for 5 days. Then decrease to  200 mg BID for 1 week , then 200 mg qDay thereafter.   amLODipine (NORVASC) 10 MG tablet Take 10 mg by mouth daily.   apixaban (ELIQUIS) 5 MG TABS tablet Take 1 tablet (5 mg total) by mouth 2 (two) times daily.   calcitonin, salmon, (MIACALCIN/FORTICAL) 200 UNIT/ACT nasal spray Place 1 spray into alternate nostrils daily.   chlorthalidone (HYGROTON) 25 MG tablet Take 25 mg by mouth  daily.   cyclobenzaprine (FLEXERIL) 10 MG tablet Take 10 mg by mouth daily as needed for muscle spasms.   diclofenac Sodium (VOLTAREN) 1 % GEL Apply 2 g topically 4 (four) times daily as needed (pain).   DULoxetine (CYMBALTA) 20 MG capsule Take 60 mg by mouth daily.   fentaNYL (DURAGESIC) 50 MCG/HR Place 1 patch onto the skin every 3 (three) days.   gabapentin (NEURONTIN) 300 MG capsule Take 300-1,500 mg by mouth daily. Take 300mg  (1 capsule) by mouth in the mornings and 1500mg  (5 capsules) in the evening.   JARDIANCE 10 MG TABS tablet Take 10 mg by mouth daily.   losartan (COZAAR) 50  MG tablet Take 50 mg by mouth daily.   metoprolol succinate (TOPROL-XL) 50 MG 24 hr tablet Take 1 tablet (50 mg total) by mouth 2 (two) times daily. Take with or immediately following a meal.   Multiple Vitamins-Minerals (MULTIVITAMIN WITH MINERALS) tablet Take 1 tablet by mouth daily.   Semaglutide (RYBELSUS) 7 MG TABS Take by mouth.   simvastatin (ZOCOR) 20 MG tablet Take 20 mg by mouth daily.     Allergies:   Metformin, Oxycodone, Oxycodone-acetaminophen, Lisinopril, Morphine, and Morphine and related   Social History   Socioeconomic History   Marital status: Single    Spouse name: Not on file   Number of children: Not on file   Years of education: Not on file   Highest education level: Not on file  Occupational History   Not on file  Tobacco Use   Smoking status: Never   Smokeless tobacco: Never  Substance and Sexual Activity   Alcohol use: Never   Drug use: Never   Sexual activity: Not on file  Other Topics Concern   Not on file  Social History Narrative   Are you right handed or left handed? Right Handed    Are you currently employed ? No    What is your current occupation? Retired    Do you live at home alone? yes   Who lives with you? No one    What type of home do you live in: 1 story or 2 story? One story home       Social Determinants of Health   Financial Resource Strain: Not on file  Food Insecurity: No Food Insecurity (07/21/2022)   Hunger Vital Sign    Worried About Running Out of Food in the Last Year: Never true    Ran Out of Food in the Last Year: Never true  Transportation Needs: No Transportation Needs (07/21/2022)   PRAPARE - Hydrologist (Medical): No    Lack of Transportation (Non-Medical): No  Physical Activity: Not on file  Stress: Not on file  Social Connections: Not on file     Family History: The patient's family history includes Alzheimer's disease in his mother; Dementia in his mother; Kidney Stones in his  father.  ROS:   Please see the history of present illness.     All other systems reviewed and are negative.  EKGs/Labs/Other Studies Reviewed:    The following studies were reviewed today: Echocardiogram 07/22/2022:   1. Windows not well visualized. Cannot assess wall motion. Left  ventricular ejection fraction, by estimation, is 60 to 65%. The left  ventricle has normal function.   2. Right ventricular systolic function is normal. The right ventricular  size is normal.   3. No evidence of mitral valve regurgitation.   4. Aortic valve regurgitation is not  visualized.   5. The inferior vena cava is normal in size with greater than 50%  respiratory variability, suggesting right atrial pressure of 3 mmHg.    EKG:  EKG is not ordered today.  The ekg ordered 07/26/2022 demonstrates normal sinus rhythm, normal tracing  Recent Labs: 07/21/2022: TSH 0.453 07/26/2022: B Natriuretic Peptide 127.2; Magnesium 1.8 07/27/2022: ALT 22 07/28/2022: BUN 8; Creatinine, Ser 0.85; Hemoglobin 15.2; Platelets 287; Potassium 3.3; Sodium 133  Recent Lipid Panel No results found for: "CHOL", "TRIG", "HDL", "CHOLHDL", "VLDL", "LDLCALC", "LDLDIRECT"   Risk Assessment/Calculations:    CHA2DS2-VASc Score = 3   This indicates a 3.2% annual risk of stroke. The patient's score is based upon: CHF History: 0 HTN History: 1 Diabetes History: 1 Stroke History: 0 Vascular Disease History: 0 Age Score: 1 Gender Score: 0       Physical Exam:    VS:  BP 134/84   Pulse 92   Ht 5' 10.5" (1.791 m)   Wt 245 lb (111.1 kg)   SpO2 94%   BMI 34.66 kg/m     Wt Readings from Last 3 Encounters:  09/13/22 245 lb (111.1 kg)  09/12/22 245 lb (111.1 kg)  09/08/22 254 lb 13.6 oz (115.6 kg)     GEN: Moderately obese, well nourished, well developed in no acute distress HEENT: Normal NECK: No JVD; No carotid bruits LYMPHATICS: No lymphadenopathy CARDIAC: RRR, no murmurs, rubs, gallops RESPIRATORY:  Clear to  auscultation without rales, wheezing or rhonchi  ABDOMEN: Soft, non-tender, non-distended MUSCULOSKELETAL:  No edema; No deformity  SKIN: Warm and dry NEUROLOGIC:  Alert and oriented x 3.  Uses a walker PSYCHIATRIC:  Normal affect   ASSESSMENT:    1. Paroxysmal atrial fibrillation (HCC)   2. Acquired thrombophilia ([place])   3. Essential hypertension   4. OSA (obstructive sleep apnea)   5. Type 2 diabetes mellitus without complication, without long-term current use of insulin ([place])   6. Moderate obesity    PLAN:    In order of problems listed above:  Paroxysmal atrial fibrillation: In sinus rhythm clinically today and his smart watch has not shown irregular rhythm.  He is on Eliquis and amiodarone.  He has a follow-up visit with his usual cardiologist March 26.  Amiodarone may not be the optimal long-term strategy in this 67 year old gentleman.   Anticoagulation: No serious bleeding problems but unfortunately he is prone to falls.  Evaluate carefully for bleeding risk.  Consider Watchman device. HTN: Adequate control. OSA: Recommend 100% compliance with CPAP. DM: Most recent hemoglobin A1c 8.0%, inadequate control.  He is on [place]. Obesity: Multiple comorbid conditions.  Weight loss would be highly beneficial.           Medication Adjustments/Labs and Tests Ordered: Current medicines are reviewed at length with the patient today.  Concerns regarding medicines are outlined above.  No orders of the defined types were placed in this encounter.  No orders of the defined types were placed in this encounter.   Patient Instructions  Medication Instructions:  No changes *If you need a refill on your cardiac medications before your next appointment, please call your pharmacy*  Follow-Up: At [facility], you and your health needs are our priority.  As part of our continuing mission to provide you with exceptional heart care, we have created designated Provider Care  Teams.  These Care Teams include your primary Cardiologist (physician) and Advanced Practice Providers (APPs -  Physician Assistants and Nurse Practitioners) who all work  together to provide you with the care you need, when you need it.  We recommend signing up for the patient portal called "MyChart".  Sign up information is provided on this After Visit Summary.  MyChart is used to connect with patients for Virtual Visits (Telemedicine).  Patients are able to view lab/test results, encounter notes, upcoming appointments, etc.  Non-urgent messages can be sent to your provider as well.   To learn more about what you can do with MyChart, go to [email or web address].    Your next appointment:    Follow up as needed    Signed, Sanda Klein, MD  09/14/2022 4:08 PM    [place]

## 2022-09-12 NOTE — Patient Instructions (Signed)
Medication Instructions:  No changes *If you need a refill on your cardiac medications before your next appointment, please call your pharmacy*  Follow-Up: At [facility], you and your health needs are our priority.  As part of our continuing mission to provide you with exceptional heart care, we have created designated Provider Care Teams.  These Care Teams include your primary Cardiologist (physician) and Advanced Practice Providers (APPs -  Physician Assistants and Nurse Practitioners) who all work together to provide you with the care you need, when you need it.  We recommend signing up for the patient portal called "MyChart".  Sign up information is provided on this After Visit Summary.  MyChart is used to connect with patients for Virtual Visits (Telemedicine).  Patients are able to view lab/test results, encounter notes, upcoming appointments, etc.  Non-urgent messages can be sent to your provider as well.   To learn more about what you can do with MyChart, go to [email or web address].    Your next appointment:   Follow up as needed    

## 2022-09-13 ENCOUNTER — Telehealth: Payer: Medicare Other | Admitting: Internal Medicine

## 2022-09-13 VITALS — Ht 70.5 in | Wt 245.0 lb

## 2022-09-13 DIAGNOSIS — I480 Paroxysmal atrial fibrillation: Secondary | ICD-10-CM

## 2022-09-13 DIAGNOSIS — R55 Syncope and collapse: Secondary | ICD-10-CM

## 2022-09-13 DIAGNOSIS — I10 Essential (primary) hypertension: Secondary | ICD-10-CM

## 2022-09-13 NOTE — Progress Notes (Signed)
Primary Physician/Referring:  Esmond Harps, MD  Patient ID: Alan Lin, male    DOB: [birth year], 67 y.o.   MRN: [id number]  Chief Complaint  Patient presents with   Atrial Fibrillation   Follow-up   Results   HPI:    Alan Lin  is a 67 y.o. male with diabetes, PAF, and HTN who is having a telephone encounter visit with me. He had another fall recently. He says he lost his balance walking to his car and fell but he got right back up and drove himself home. He felt fine after his fall but then he woke up in the middle of night and was in a lot of pain from the fall. He went to the ED at [facility] and luckily he did not re-fracture his ribs. Patient has been able to do all of his ADLs without chest pain, shortness of breath, or palpitations. He has no exertional dyspnea and he thinks he is moving around better now. He does not know how to place his event monitor on so I will have the company reach out to him. Patient will come for in-person follow-up next visit but he requested telephone encounter today due to pain.   Past Medical History:  Diagnosis Date   Arthritis    Diabetes mellitus without complication ([place])    Hypertension    Sleep apnea    No past surgical history on file. Family History  Problem Relation Age of Onset   Dementia Mother    Alzheimer's disease Mother    Kidney Stones Father     Social History   Tobacco Use   Smoking status: Never   Smokeless tobacco: Never  Substance Use Topics   Alcohol use: Never   Marital Status: Single  ROS   Objective  Height 5' 10.5" (1.791 m), weight 245 lb (111.1 kg). Body mass index is 34.66 kg/m.     09/13/2022    2:04 PM 09/12/2022    2:39 PM 09/08/2022    5:15 AM  Vitals with BMI  Height 5' 10.5" 5' 10.5"   Weight 245 lbs 245 lbs   BMI [id number]   Systolic  [id number]  Diastolic  84 58  Pulse  92 95    PE not performed as this was telephone encounter  Medications and allergies   Allergies   Allergen Reactions   Metformin Diarrhea and Nausea And Vomiting   Oxycodone Other (See Comments)    Constipation   Oxycodone-Acetaminophen     Constipation   Lisinopril Cough   Morphine Itching    Other reaction(s): Other (See Comments)  Constipation   Morphine And Related Itching     Medication list after today's encounter   Current Outpatient Medications:    acetaminophen (TYLENOL) 325 MG tablet, Take 1 tablet (325 mg total) by mouth every 6 (six) hours as needed for mild pain (or Fever >/= 101)., Disp: , Rfl:    amiodarone (PACERONE) 200 MG tablet, Amio 400 mg BID for 5 days. Then decrease to  200 mg BID for 1 week , then 200 mg qDay thereafter., Disp: , Rfl:    amLODipine (NORVASC) 10 MG tablet, Take 10 mg by mouth daily., Disp: , Rfl:    apixaban (ELIQUIS) 5 MG TABS tablet, Take 1 tablet (5 mg total) by mouth 2 (two) times daily., Disp: 60 tablet, Rfl:    calcitonin, salmon, (MIACALCIN/FORTICAL) 200 UNIT/ACT nasal spray, Place 1 spray into alternate nostrils daily.,  Disp: , Rfl:    chlorthalidone (HYGROTON) 25 MG tablet, Take 25 mg by mouth daily., Disp: , Rfl:    cyclobenzaprine (FLEXERIL) 10 MG tablet, Take 10 mg by mouth daily as needed for muscle spasms., Disp: , Rfl:    diclofenac Sodium (VOLTAREN) 1 % GEL, Apply 2 g topically 4 (four) times daily as needed (pain)., Disp: , Rfl:    DULoxetine (CYMBALTA) 20 MG capsule, Take 60 mg by mouth daily., Disp: , Rfl:    fentaNYL (DURAGESIC) 50 MCG/HR, Place 1 patch onto the skin every 3 (three) days., Disp: 5 patch, Rfl: 0   gabapentin (NEURONTIN) 300 MG capsule, Take 300-1,500 mg by mouth daily. Take 300mg  (1 capsule) by mouth in the mornings and 1500mg  (5 capsules) in the evening., Disp: , Rfl:    JARDIANCE 10 MG TABS tablet, Take 10 mg by mouth daily., Disp: , Rfl:    losartan (COZAAR) 50 MG tablet, Take 50 mg by mouth daily., Disp: , Rfl:    metoprolol succinate (TOPROL-XL) 50 MG 24 hr tablet, Take 1 tablet (50 mg total) by mouth  2 (two) times daily. Take with or immediately following a meal., Disp: , Rfl:    Multiple Vitamins-Minerals (MULTIVITAMIN WITH MINERALS) tablet, Take 1 tablet by mouth daily., Disp: , Rfl:    Semaglutide (RYBELSUS) 7 MG TABS, Take by mouth., Disp: , Rfl:    simvastatin (ZOCOR) 20 MG tablet, Take 20 mg by mouth daily., Disp: , Rfl:    potassium chloride SA (KLOR-CON M) 20 MEQ tablet, Take 1 tablet (20 mEq total) by mouth daily. One po bid x 3 days, then one po once a day (Patient not taking: Reported on 09/13/2022), Disp: 2 tablet, Rfl: 20  Laboratory examination:   Lab Results  Component Value Date   NA 133 (L) 07/28/2022   K 3.3 (L) 07/28/2022   CO2 28 07/28/2022   GLUCOSE 179 (H) 07/28/2022   BUN 8 07/28/2022   CREATININE 0.85 07/28/2022   CALCIUM 9.0 07/28/2022   GFRNONAA >60 07/28/2022       Latest Ref Rng & Units 07/28/2022    2:29 AM 07/27/2022    6:14 AM 07/26/2022    5:06 AM  CMP  Glucose 70 - 99 mg/dL 179  163  186   BUN 8 - 23 mg/dL 8  10  14    Creatinine 0.61 - 1.24 mg/dL 0.85  0.77  0.88   Sodium 135 - 145 mmol/L 133  133  130   Potassium 3.5 - 5.1 mmol/L 3.3  3.1  3.4   Chloride 98 - 111 mmol/L 96  97  93   CO2 22 - 32 mmol/L 28  26  28    Calcium 8.9 - 10.3 mg/dL 9.0  8.6  8.8   Total Protein 6.5 - 8.1 g/dL  5.2  5.1   Total Bilirubin 0.3 - 1.2 mg/dL  1.0  1.0   Alkaline Phos 38 - 126 U/L  45  48   AST 15 - 41 U/L  16  13   ALT 0 - 44 U/L  22  24       Latest Ref Rng & Units 07/28/2022    2:29 AM 07/27/2022    6:14 AM 07/26/2022    5:06 AM  CBC  WBC 4.0 - 10.5 K/uL 10.5  10.1  11.6   Hemoglobin 13.0 - 17.0 g/dL 15.2  14.5  14.5   Hematocrit 39.0 - 52.0 % 45.3  41.4  41.6  Platelets 150 - 400 K/uL 287  264  246     Lipid Panel No results for input(s): "CHOL", "TRIG", "LDLCALC", "VLDL", "HDL", "CHOLHDL", "LDLDIRECT" in the last 8760 hours.  HEMOGLOBIN A1C Lab Results  Component Value Date   HGBA1C 8.0 (H) 07/21/2022   MPG 182.9 07/21/2022   TSH Recent Labs     07/21/22 1907  TSH 0.453    External labs:     Radiology:    Cardiac Studies:   ECHO COMPLETE WITH IMAGING ENHANCING AGENT 07/22/2022 1. Windows not well visualized. Cannot assess wall motion. Left ventricular ejection fraction, by estimation, is 60 to 65%. The left ventricle has normal function. 2. Right ventricular systolic function is normal. The right ventricular size is normal. 3. No evidence of mitral valve regurgitation. 4. Aortic valve regurgitation is not visualized. 5. The inferior vena cava is normal in size with greater than 50% respiratory variability, suggesting right atrial pressure of 3 mmHg.   Regadenoson (with Mod Bruce protocol) Nuclear stress test 08/29/2022:  Myocardial perfusion is abnormal. There is a small sized reversible mild defect in the mid to apical lateral, inferior.  Overall LV systolic function is normal without regional wall motion abnormalities. Stress LV EF: 61%.  Equivocal ECG stress. The heart rate response was consistent with Regadenoson. Resting EKG/ECG demonstrated normal sinus rhythm. Non-specific ST-T abnormality. Peak EKG/ECG revealed less than 1 mm ST depression of the inferolateral leads.  No previous exam available for comparison. Intermediate risk study due to abnormal EKG response although the perfusion defect is small. Clinical correlation recommended.    EKG:   07/25/2022: Atrial fibrillation with  RVR, rate 164 bpm. 07/26/2022: normal sinus rhythm, rate 76 bpm.  Assessment     ICD-10-CM   1. Paroxysmal atrial fibrillation (HCC)  I48.0     2. Syncope and collapse  R55     3. Essential hypertension  I10        No orders of the defined types were placed in this encounter.   No orders of the defined types were placed in this encounter.   There are no discontinued medications.    Recommendations:   Alan Lin is a 67 y.o.  male with PAF  Paroxysmal atrial fibrillation (HCC) Denies palpitations Continue  Eliquis and amiodarone   Essential hypertension Continue current cardiac medications. Encourage low-sodium diet, less than 2000 mg daily. Follow-up in 3 months or sooner if needed.   Syncope and collapse Event monitor ordered but patient does not know how to use it. I will reach out to [place] today and have someone call him or go to his home. Stress test shows small reversible perfusion defect but patient is completely asymptomatic with activity. He has no chest pain or shortness of breath with max exertion. He is comfortable continuing with medical management.      Floydene Flock, DO, [facility]  09/13/2022, 2:27 PM Office: [phone] Pager: [phone]

## 2022-09-14 ENCOUNTER — Encounter: Payer: Self-pay | Admitting: Cardiovascular Disease

## 2022-09-14 NOTE — Care Management (Signed)
ED RN Case Manager received call from [facility] concerning clarification on Fentanyl prescription from 09/08/22. RN Case Manager will forward message to EDP to clarify.  Laurena Slimmer RN, BSN CNOR ED RN Care Manager [phone]

## 2022-10-11 ENCOUNTER — Encounter: Payer: Self-pay | Admitting: Neurology

## 2022-10-11 ENCOUNTER — Ambulatory Visit (INDEPENDENT_AMBULATORY_CARE_PROVIDER_SITE_OTHER): Payer: Medicare Other | Admitting: Neurology

## 2022-10-11 VITALS — BP 127/65 | HR 90 | Ht 70.5 in | Wt 250.0 lb

## 2022-10-11 DIAGNOSIS — G629 Polyneuropathy, unspecified: Secondary | ICD-10-CM

## 2022-10-11 DIAGNOSIS — Z9181 History of falling: Secondary | ICD-10-CM

## 2022-10-11 DIAGNOSIS — M5416 Radiculopathy, lumbar region: Secondary | ICD-10-CM

## 2022-10-11 DIAGNOSIS — R2681 Unsteadiness on feet: Secondary | ICD-10-CM

## 2022-10-11 NOTE — Progress Notes (Signed)
Follow-up Visit   Date: 10/11/2022    Alan Lin MRN: [id number] DOB: 1955/06/30    Alan Lin is a 67 y.o. right-handed male with hypertension, well-controlled diabetes mellitus, OSA, cervical spondylosis s/p ACDF at C4-5 returning to the clinic for follow-up of gait instability.  The patient was accompanied to the clinic by self.     IMPRESSION/PLAN: Multifactorial gait instability due to overlapping lumbosacral radiculopathy and neuropathy, and possibly syncope.  He has mildly restricted upgaze, but otherwise no signs of parkinsonism.  He has completed PT and now more complaint with using a walker.  Unfortunately, it seems that he continues to have 1-2 falls per month.  Given his high risk of falls, I have asked him to talk to his cardiologist regarding eliquis, as it may pose a greater risk to keep him on this if it is unclear whether he has atrial fibrillation.  Continue supportive care for falls.  Always use walker and wheelchair for long distances.   Return to clinic in 6 months  --------------------------------------------- History of present illness: Over the past 1-2 years, he has been having greater difficulty with balance and has several falls.  He fell at an outdoor family reunion in the summer and tripped while trying to take trash out to the street, which resulted in large hematoma of the leg.  He tends to fall forwards without warning.  He endorses unsteadiness and uses a cane and rollator.  He has tried PT which helps.  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  Around the same time, he underwent cervical decompression and fusion at C4-5 at [facility].  He is followed by spine surgeon who also ordered MRI lumbar spine which shows multilevel degenerative changes with severe bilateral neural foraminal stenosis at L5-S1.  He has low back pain and gets injections.  He denies weakness in the legs.  He does have numbness and tingling in the  toes.   UPDATE 10/11/2022: He is here for follow-up visit.  Since January, he has been to the ER twice with falls and had 2 falls over the past 3 weeks at his facility. His last two falls occurred when he was trying to walk too fast.  While he was hospitalizaed in February, his notes indicate new onset atrial fibrillation for which he was started on eliquis, amiodarone, and metoprolol.  He sought second opinion who is not sure whether he has afib and has taken him off amiodarone. He endorses low back pain and is seeing pain management.  He had facet injections yesterday.  He denies new leg weakness.  Medications:  Current Outpatient Medications on File Prior to Visit  Medication Sig Dispense Refill   acetaminophen (TYLENOL) 325 MG tablet Take 1 tablet (325 mg total) by mouth every 6 (six) hours as needed for mild pain (or Fever >/= 101).     amLODipine (NORVASC) 10 MG tablet Take 10 mg by mouth daily.     apixaban (ELIQUIS) 5 MG TABS tablet Take 1 tablet (5 mg total) by mouth 2 (two) times daily. 60 tablet    calcitonin, salmon, (MIACALCIN/FORTICAL) 200 UNIT/ACT nasal spray Place 1 spray into alternate nostrils daily.     chlorthalidone (HYGROTON) 25 MG tablet Take 25 mg by mouth daily.     cyclobenzaprine (FLEXERIL) 10 MG tablet Take 10 mg by mouth daily as needed for muscle spasms.     diclofenac Sodium (VOLTAREN) 1 % GEL Apply 2 g  topically 4 (four) times daily as needed (pain).     DULoxetine (CYMBALTA) 20 MG capsule Take 60 mg by mouth daily.     gabapentin (NEURONTIN) 300 MG capsule Take 300-1,500 mg by mouth daily. Take  (1 capsule) by mouth in the mornings and  (5 capsules) in the evening.     JARDIANCE 10 MG TABS tablet Take 10 mg by mouth daily.     losartan (COZAAR) 50 MG tablet Take 50 mg by mouth daily.     metoprolol succinate (TOPROL-XL) 50 MG 24 hr tablet Take 1 tablet (50 mg total) by mouth 2 (two) times daily. Take with or immediately following a meal.     Multiple  Vitamins-Minerals (MULTIVITAMIN WITH MINERALS) tablet Take 1 tablet by mouth daily.     Semaglutide (RYBELSUS) 7 MG TABS Take by mouth.     simvastatin (ZOCOR) 20 MG tablet Take 20 mg by mouth daily.     tamsulosin (FLOMAX) 0.4 MG CAPS capsule Take 0.4 mg by mouth daily.     amiodarone (PACERONE) 200 MG tablet Amio 400 mg BID for 5 days. Then decrease to  200 mg BID for 1 week , then 200 mg qDay thereafter.     fentaNYL (DURAGESIC) 50 MCG/HR Place 1 patch onto the skin every 3 (three) days. 5 patch 0   No current facility-administered medications on file prior to visit.    Allergies:  Allergies  Allergen Reactions   Metformin Diarrhea and Nausea And Vomiting   Oxycodone Other (See Comments)    Constipation   Oxycodone-Acetaminophen     Constipation   Lisinopril Cough   Morphine Itching    Other reaction(s): Other (See Comments)  Constipation   Morphine And Related Itching    Vital Signs:  BP 127/65   Pulse 90   Ht 5' 10.5" (1.791 m)   Wt 250 lb (113.4 kg)   SpO2 94%   BMI 35.36 kg/m    Neurological Exam: MENTAL STATUS including orientation to time, place, person, recent and remote memory, attention span and concentration, language, and fund of knowledge is normal.  Speech is not dysarthric.  CRANIAL NERVES:  Pupils equal round and reactive to light.  Normal conjugate, extra-ocular eye movements in all directions of gaze, except mildly restricted upgaze.  No ptosis .  Face is symmetric. Palate elevates symmetrically.  Tongue is midline.  MOTOR:  Motor strength is 5/5 in all extremities, except 5-/5 with toe extension.  No atrophy, fasciculations or abnormal movements.  No pronator drift.  Tone is normal.    MSRs:  Reflexes are 2+/4 throughout, except absent at the ankles.  SENSORY:  Reduced vibration at the great toe bilaterally.  COORDINATION/GAIT:  Normal finger-to- nose-finger.  Intact rapid alternating movements bilaterally.  Gait mildly wide based, stable, assisted  with walker.   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 There is also a superimposed chronic L5-S1 radiculopathy affecting bilateral lower extremities.  MRI cervical spine wo contrast 03/06/2022: -- C4-C5 ACDF with prominent loss of vertebral body height of C6 with associated bone marrow edema, which is likely degenerative. The degree of vertebral body height loss appears overall to that of previous cervical spine radiographs in 2021.   -- Multilevel cervical spondylosis, most notably including neural foraminal narrowing on the left at C5-C6 and bilaterally at C6-C7, with additional levels of moderate throughout the cervical spine, as above.  MRI lumbar spine wo contrast 02/14/2021: Multilevel lumbar spondylosis, worse at L5/S1 where there is severe bilateral neural foraminal stenosis.    Total time spent reviewing records, interview, history/exam, documentation, and coordination of care on day of encounter:  30 min    Thank you for allowing me to participate in patient's care.  If I can answer any additional questions, I would be pleased to do so.    Sincerely,    Langley Ingalls K. Allena Katz, DO

## 2022-10-11 NOTE — Patient Instructions (Signed)
Discuss with your cardiologist about eliquis given your high risk of falls.  I will see you back in 6 months

## 2022-11-08 ENCOUNTER — Ambulatory Visit: Payer: Medicare Other | Admitting: Internal Medicine

## 2022-12-02 NOTE — Progress Notes (Signed)
 I reviewed the history, physical findings and diagnostic studies, discussed the findings, assessment and plan with the Resident and agree with the recommendation as documented in the Resident's note.  - Edson Snowball, MD, [facility]

## 2023-03-10 NOTE — Progress Notes (Signed)
 DIVISION OF CARDIOLOGY [facility] , Alan Lin       Date of Service: 03/03/2023  Return Patient Clinic Note  PCP: Referring Provider:  Agustina Greig Bills, MD [address] [place] [address] [place] [address] Phone: [phone] Fax: [phone] Pcp, None Per Patient [address] [employer] [place],  [place] [address] Phone: N/A Fax:    Assessment and Plan:    Alan Lin is a 67 y.o. male with a history of hypertension and diabetes who presents for follow-up of atrial fibrillation.  Atrial fibrillation Reportedly diagnosed with atrial fibrillation during OSH admission in early 2024. Initially managed with amiodarone  and apixaban , which were stopped when the burden of afib was unclear. Recent Zio showed >3000 brief episodes of SVT (probable afib) in 12 days. Given frequent falls, agree with patient's hesitancy to start therapeutic anticoagulation - Subclinical hypothyroidism, so amiodarone  was avoided. Started dronedarone  01/2023 with symptomatic improvement.  - Repeat Zio pending - Continue metoprolol  succinate 25 mg daily   Hypertension - BP at goal today  - Continue losartan 50 mg daily, chlorthalidone 25 mg daily and amlodipine  10 mg daily    The patient was seen and discussed with Dr. Durel.   Subjective:     Reason for Visit: Evaluation for possible atrial fibrillation   History of Present Illness: Alan Lin is a 67 y.o. male with a history of hypertension and diabetes who presents as referral to cardiology clinic for evaluation of suspected atrial fibrillation.     Recent Ziopatch showed >3000 episodes of SVT described as probable afib (longest was 16 seconds) over 12 day period. Patient reports being completely asymptomatic with episodes of afib. Continues to be significantly limited by his frequent falls due to his history of cervical stenosis as well as potentially new diagnosis of NPH.    He is very hesitant to start blood thinner as he  continues to have falls with significant bruising even in the absence of therapeutic anticoagulation. He is open to resuming amiodarone , which he tolerated well in the past.  Denies any orthopnea or PND.    Interval events: Feeling well. No longer getting notifications about HR>120bpm after starting dronedarone . No other complaints.   Cardiovascular History: Atrial fibrillation Hypertension Diabetes OSA (on CPAP)  Echo: TTE 10/25/2022   1. Technically difficult study.   2. Concentric left ventricular remodeling with normal global and segmental systolic function (EF >55%).   3. Normal right ventricular size and systolic function.   4. Aortic sclerosis without evidence of significant stenosis or regurgitation.  TTE 07/22/2022 (via [facility]) - Normal LVEF 60-65% - Normal RV size/systolic function - No significant valvular abnormality   Stress test: Treadmill Stress Echo 07/2017 Abnormal stress echo with findings suggestive of at least moderate probability of clinically significant coronary artery disease and/or adverse cardiac events. >1 mm of ST depressions in the inferior and lateral leads during exercise and recovery after attaining 104% of the age-predicted maximal HR. Normal resting left ventricular systolic function with relative hypokinesis of the mid inferoseptal wall after exercise. Poor exercise capacity, limited by fatigue with no exercise induced chest pain or dyspnea at an estimated workload of 7.0 METS.  Cardiac catheterization: LHC 07/2017 Non-flow-limiting coronary artery disease including 40% proximal RCA lesion and 30% OM1. Normal left ventricular filling pressures (LVEDP = 12 mm Hg).  Cardiovascular Surgery: None  EP Procedures and Devices: Zio 12/30/22: - Monitoring was performed from 12/02/2022 to 12/16/2022, with 12 days 22 hours of analyzable data. -  The predominant rhythm was sinus rhythm, with a rate range of 66-127 bpm (average 96 bpm) - Frequent  supraventricular (7.1% burden) and rare ventricular ectopics were recorded, with no ventricular tachyarrhythmias.  - 3033 episodes of probable atrial fibrillation occurred, with the fastest lasting 13 beats and the longest lasting 16 seconds at an average rate of 139 bpm. The average rate was 128 bpm, ranging from 79-240 bpm. Many episdodes occurred during the nighttime and sleeping hours. - Two events were triggered, corresponding to sinus rhythm ranging from 94 to 103 bpm, with occasional supraventricular ectopics. No symptoms were reported.  Non-Invasive Studies: ECG 09/23/2022: Sinus tachycardia (112 bpm) with occasional PACs, inferolateral ST depressions    Relevant Past Medical History: Active Ambulatory Problems    Diagnosis Date Noted  . Backache 07/03/2012  . Benign neoplasm of colon 12/06/2010  . Depressive disorder 07/03/2012  . Folliculitis 05/24/2011  . Gout 05/24/2011  . Hyperlipidemia 05/24/2011  . Hypertension, benign 05/24/2011  . Sleep apnea 05/24/2011  . Obesity 03/04/2013  . Constipation 03/10/2014  . 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 Tachycardia 03/30/2015  . Abdominal wall mass of right flank 03/15/2016  . Diabetes mellitus with no complication (CMS-HCC) 03/15/2016  . Left shoulder pain 11/29/2016  . Abnormal stress echocardiogram 08/09/2017  . Right hip pain 07/30/2018  . Primary osteoarthritis of both knees 07/30/2018  . Anxiety 07/16/2019  . Acute pain of right shoulder 07/16/2019  . 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 Spondylolisthesis of cervical region 02/06/2020  . Trigger thumb, right thumb 03/31/2020  . At high risk for falls 08/23/2021  . BMI 37.0-37.9, adult 08/23/2021  . 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 Stress and adjustment reaction 05/30/2022  . Chronic bilateral low back pain 09/01/2022  . Left knee pain 09/02/2022  . Paroxysmal atrial fibrillation (CMS-HCC) 01/07/2023  . Impaired gait and mobility  03/05/2023  . Urinary incontinence 03/05/2023   Resolved Ambulatory Problems    Diagnosis Date Noted  . Cervical cord compression with myelopathy (CMS-HCC) 02/06/2020   Past Medical History:  Diagnosis Date  . Arthritis   . CPAP (continuous positive airway pressure) dependence   . Diabetes mellitus (CMS-HCC) 02/2016  . Hypertension   . Joint pain   . Osteoarthritis     Patient's Medications  New Prescriptions   No medications on file  Previous Medications   AMLODIPINE  (NORVASC ) 10 MG TABLET    Take 1 tablet (10 mg total) by mouth daily.   ATORVASTATIN  (LIPITOR) 10 MG TABLET    Take 1 tablet (10 mg total) by mouth daily.   CALCITONIN, SALMON, (FORTICAL/MIACALCIN) 200 UNIT/ACTUATION NASAL SPRAY    USE 1 SPRAY INTO ALTERNATING NOSTRILS DAILY. USE FOR 2 WEEKS TO HELP WITH RIB PAIN   CHLORTHALIDONE (HYGROTON) 25 MG TABLET    Take 1 tablet (25 mg total) by mouth daily.   CHOLECALCIFEROL, VITAMIN D3, (VITAMIN D3 ORAL)    Take by mouth.   CYCLOBENZAPRINE  (FLEXERIL ) 10 MG TABLET    Take 1 tablet (10 mg total) by mouth nightly as needed for muscle spasms. Frequency:QHS   Dosage:10   MG  Instructions:  Note:Dose: 10MG    DICLOFENAC  SODIUM (VOLTAREN ) 1 % GEL    Apply 2 g topically four (4) times a day.   DOCOSAHEXAENOIC ACID/EPA (FISH OIL ORAL)    Take by mouth.   DRONEDARONE  (MULTAQ ) 400 MG TABLET    Take 1 tablet (400 mg total) by mouth in the morning and 1 tablet (400  mg total) in the evening. Take with meals.   DULOXETINE  (CYMBALTA ) 60 MG CAPSULE    Take 1 capsule (60 mg total) by mouth daily.   GABAPENTIN  (NEURONTIN ) 300 MG CAPSULE    TAKE 5 CAPSULE BY MOUTH EVERY DAY EVERY NIGHT AS NEEDED May also take 1-2 pills additional 2 times a day   JARDIANCE  10 MG TABLET    Take 1 tablet (10 mg total) by mouth daily.   LOSARTAN (COZAAR) 50 MG TABLET    Take 1 tablet (50 mg total) by mouth daily.   METHOCARBAMOL  (ROBAXIN ) 500 MG TABLET    TAKE 1 TABLET (500 MG TOTAL) BY MOUTH THREE (3) TIMES A DAY.    METOPROLOL  SUCCINATE (TOPROL -XL) 25 MG 24 HR TABLET    Take 1 tablet (25 mg total) by mouth daily.   MULTIVITAMIN WITH MINERALS TABLET    Take 1 tablet by mouth.   POLYETHYLENE GLYCOL (MIRALAX ) 17 GRAM PACKET    Take 17 g by mouth Two (2) times a day.   POTASSIUM/MAGNESIUM  (MAGNESIUM -POTASSIUM ORAL)    Take by mouth.   SEMAGLUTIDE  (RYBELSUS ) 7 MG TAB    Take 1 tablet by mouth in the morning.   TAMSULOSIN  (FLOMAX ) 0.4 MG CAPSULE    Take 1 capsule (0.4 mg total) by mouth daily.   TRAZODONE  (DESYREL ) 50 MG TABLET    Take 1 tablet (50 mg total) by mouth nightly.   TROSPIUM (SANCTURA) 20 MG TABLET    Take 1 tablet (20 mg total) by mouth every twelve (12) hours.  Modified Medications   No medications on file  Discontinued Medications   No medications on file    Allergies: Allergies  Allergen Reactions  . Lisinopril Other (See Comments) and Cough    Other reaction(s): Cough cough  . Percocet [Oxycodone-Acetaminophen ]     Constipation   . Metformin Diarrhea and Nausea And Vomiting  . Morphine Itching  . Opioids - Morphine Analogues Itching    Social History: He  reports that he has never smoked. He has been exposed to tobacco smoke. He has never used smokeless tobacco. He reports current alcohol use. He reports that he does not use drugs.  Family History: His family history includes Alcohol abuse in his father; Alzheimer's disease in his mother; Aneurysm in his father; Glaucoma in his brother; Hypertension in his father, maternal aunt, maternal aunt, and mother; No Known Problems in his brother, maternal grandfather, maternal grandmother, paternal aunt, paternal grandfather, paternal grandmother, and sister; Stroke in his father.  Review of Systems 10 systems were reviewed and negative except as noted in HPI.    Objective:     Physical Exam BP 138/84 (BP Site: R Arm, BP Position: Sitting, BP Cuff Size: Large)   Pulse 90   Resp 18   Ht 179.1 cm (5' 10.5)   Wt (!) 114.7 kg (252  lb 12.8 oz)   SpO2 94%   BMI 35.76 kg/m   Wt Readings from Last 3 Encounters:  03/03/23 (!) 114.7 kg (252 lb 12.8 oz)  03/02/23 (!) 115.4 kg (254 lb 8 oz)  02/03/23 (!) 114.6 kg (252 lb 9.6 oz)    General:  Alert, well appearing, no distress.  HEENT:  MMM.  Neck: JVP not appreciably elevated  Lungs:   CTAB bilaterally with normal WOB.  Heart:  Regular rhythm, normal rate, normal S1/S2. No murmurs, gallops, or rubs  Abdomen:   Soft, non-tender, non-distended.  Extremities: 2+ radial pulses bilaterally. 1+ pitting edema bilaterally.  Skin: No lesions/rashes.  Neurologic: No focal deficits.    Most recent labs   Lab Results  Component Value Date   WBC 7.9 01/27/2020   RBC 4.95 01/27/2020   HGB 14.7 01/27/2020   HCT 42.5 01/27/2020   MCV 85.9 01/27/2020   MCH 29.8 01/27/2020   MCHC 34.7 01/27/2020   RDW 13.7 01/27/2020   PLT 254 01/27/2020   MPV 9.7 01/27/2020    Lab Results  Component Value Date   NA 135 02/03/2023   K 3.3 (L) 02/03/2023   CL 98 02/03/2023   CO2 24.2 02/03/2023   BUN 10 02/03/2023   CREATININE 0.81 02/03/2023   GFR >= 60 07/03/2012   GLU 294 (H) 02/03/2023   CALCIUM  10.3 02/03/2023   ALBUMIN 4.1 02/03/2023    Lab Results  Component Value Date   CHOL 187 09/23/2022   CHOL 128 03/30/2015   CHOL 149 07/03/2012   Lab Results  Component Value Date   HDL 27 (L) 09/23/2022   HDL 28 (L) 03/30/2015   HDL 32 (L) 07/03/2012   Lab Results  Component Value Date   LDL 93 09/23/2022   Lab Results  Component Value Date   TRIG 334 (H) 09/23/2022   TRIG 153 (H) 08/23/2010   No components found for: [facility]  Lab Results  Component Value Date   PT 12.1 01/27/2020   INR 1.02 01/27/2020     Lang Alert, MD, MPH Cardiology Fellow

## 2023-04-24 ENCOUNTER — Ambulatory Visit: Payer: Medicare Other | Admitting: Neurology

## 2023-06-26 ENCOUNTER — Other Ambulatory Visit: Payer: Self-pay

## 2023-06-26 ENCOUNTER — Emergency Department (HOSPITAL_COMMUNITY)
Admission: EM | Admit: 2023-06-26 | Discharge: 2023-06-26 | Disposition: A | Discharge status: Home / Self Care | Payer: Medicare PPO | Attending: Emergency Medicine | Admitting: Emergency Medicine

## 2023-06-26 ENCOUNTER — Encounter (HOSPITAL_COMMUNITY): Payer: Self-pay

## 2023-06-26 ENCOUNTER — Emergency Department (HOSPITAL_COMMUNITY): Payer: Medicare PPO

## 2023-06-26 DIAGNOSIS — F419 Anxiety disorder, unspecified: Secondary | ICD-10-CM | POA: Insufficient documentation

## 2023-06-26 DIAGNOSIS — Z79899 Other long term (current) drug therapy: Secondary | ICD-10-CM | POA: Diagnosis not present

## 2023-06-26 DIAGNOSIS — Z7984 Long term (current) use of oral hypoglycemic drugs: Secondary | ICD-10-CM | POA: Diagnosis not present

## 2023-06-26 DIAGNOSIS — M545 Low back pain, unspecified: Secondary | ICD-10-CM | POA: Diagnosis present

## 2023-06-26 DIAGNOSIS — Z7901 Long term (current) use of anticoagulants: Secondary | ICD-10-CM | POA: Insufficient documentation

## 2023-06-26 DIAGNOSIS — M546 Pain in thoracic spine: Secondary | ICD-10-CM | POA: Diagnosis not present

## 2023-06-26 MED ORDER — NAPROXEN 250 MG PO TABS
500.0000 mg | ORAL_TABLET | Freq: Once | ORAL | Status: AC
  Administered 2023-06-26: 500 mg via ORAL
  Filled 2023-06-26: qty 2

## 2023-06-26 NOTE — ED Triage Notes (Addendum)
 Patient BIB [facility] from massage therapy appt where he fell in their bathroom. Patient reports frequent falls and 5/10 back pain, denies LOC and thinners. Patient also reports dizziness and lightheadedness and has not eaten anything today. CBG 246 with EMS.

## 2023-06-26 NOTE — ED Provider Triage Note (Signed)
 Emergency Medicine Provider Triage Evaluation Note  Alan Lin , a 68 y.o. male  was evaluated in triage.  Pt complains of pain in his back on the side, lower and mid back, had a fall while he was walking at his nursing facility, he states I live in an old folks home due to poor balance.  He walks with 2 canes, he states he cut a foot on the lip of a tile and that is what caused him to fall.  He fell forward landing on his abdomen, he did not hit his back or his head, no numbness or weakness, he states he cannot get up by himself when he falls, that is why the paramedics were called..  Review of Systems  Positive: Back pain Negative: Numbness or weakness  Physical Exam  There were no vitals taken for this visit. Gen:   Awake, no distress answers questions appropriately Resp:  Normal effort clear lung sounds, clear heart sounds, normal pulses MSK:   Moves extremities without difficulty, has difficulty lifting them because of pain in his back but not pain in his legs, no numbness or weakness in the legs Other:  Normal level of alertness  Medical Decision Making  Medically screening exam initiated at 2:12 PM.  Appropriate orders placed.  Alan Lin was informed that the remainder of the evaluation will be completed by another provider, this initial triage assessment does not replace that evaluation, and the importance of remaining in the ED until their evaluation is complete.  X-rays of thoracic and lumbar spine, likely muscular, patient is not in any distress and has no focal neurologic deficits   Alan Rogue, MD 06/26/23 1413

## 2023-06-26 NOTE — Discharge Instructions (Signed)
 You were seen today for low back pain post fall. X-rays were unremarkable and reassuring that no fracture present. I think you should continue with home pain medication including tylenol  and ibuprofen .   Tylenol  650mg  every 6 hours and Ibuprofen  400mg  every 6 hours as needed.   However, if you begin experiencing incontinence, numbness, tingling, weakness, you will need further in-depth imaging and evaluation in the ER.   It was a pleasure seeing you in the ER today.

## 2023-06-26 NOTE — ED Provider Notes (Signed)
 [name] EMERGENCY DEPARTMENT AT [facility] Provider Note   CSN: [id number] Arrival date & time: 06/26/23  1418     History  Chief Complaint  Patient presents with   Back Pain    Alan Lin is a 68 y.o. male.   Back Pain  Patient presents to the ED after tripping on a ledge and falling on his chest where he then began experiencing back pain on the right lower part of his back.  This happened after he was in a massage room and did not see his footing in the dark.  Denies weakness, numbness, tingling, loss of sensation.  He states he has anti-inflammatories at home's and is anxious to be leaving.    Home Medications Prior to Admission medications   Medication Sig Start Date End Date Taking? Authorizing Provider  acetaminophen  (TYLENOL ) 325 MG tablet Take 1 tablet (325 mg total) by mouth every 6 (six) hours as needed for mild pain (or Fever >/= 101). 07/28/22   Elgergawy, Brayton RAMAN, MD  amiodarone  (PACERONE ) 200 MG tablet Amio 400 mg BID for 5 days. Then decrease to  200 mg BID for 1 week , then 200 mg qDay thereafter. 07/28/22   Elgergawy, Brayton RAMAN, MD  amLODipine  (NORVASC ) 10 MG tablet Take 10 mg by mouth daily. 09/10/22   [provider]  apixaban  (ELIQUIS ) 5 MG TABS tablet Take 1 tablet (5 mg total) by mouth 2 (two) times daily. 07/28/22   Elgergawy, Brayton RAMAN, MD  chlorthalidone (HYGROTON) 25 MG tablet Take 25 mg by mouth daily. 08/13/22   [provider]  cyclobenzaprine  (FLEXERIL ) 10 MG tablet Take 10 mg by mouth daily as needed for muscle spasms.    [provider]  diclofenac  Sodium (VOLTAREN ) 1 % GEL Apply 2 g topically 4 (four) times daily as needed (pain). 09/20/19   [provider]  DULoxetine  (CYMBALTA ) 20 MG capsule Take 60 mg by mouth daily. 08/28/19   [provider]  fentaNYL  (DURAGESIC ) 50 MCG/HR Place 1 patch onto the skin every 3 (three) days. 09/08/22   Horton, Charmaine FALCON, MD  gabapentin  (NEURONTIN ) 300 MG capsule  Take 300-1,500 mg by mouth daily. Take 300mg  (1 capsule) by mouth in the mornings and 1500mg  (5 capsules) in the evening. 08/03/16   [provider]  JARDIANCE  10 MG TABS tablet Take 10 mg by mouth daily. 08/09/22   [provider]  losartan (COZAAR) 50 MG tablet Take 50 mg by mouth daily. 07/14/16   [provider]  metoprolol  succinate (TOPROL -XL) 50 MG 24 hr tablet Take 1 tablet (50 mg total) by mouth 2 (two) times daily. Take with or immediately following a meal. 07/28/22   Elgergawy, Brayton RAMAN, MD  Multiple Vitamins-Minerals (MULTIVITAMIN WITH MINERALS) tablet Take 1 tablet by mouth daily.    [provider]  Semaglutide  (RYBELSUS ) 7 MG TABS Take by mouth.    [provider]  simvastatin  (ZOCOR ) 20 MG tablet Take 20 mg by mouth daily. 07/07/16   [provider]  tamsulosin  (FLOMAX ) 0.4 MG CAPS capsule Take 0.4 mg by mouth daily. 09/28/22   [provider]      Allergies    Metformin, Oxycodone, Oxycodone-acetaminophen , Lisinopril, Morphine, and Morphine and codeine    Review of Systems   Review of Systems  Musculoskeletal:  Positive for back pain.  All other systems reviewed and are negative.   Physical Exam Updated Vital Signs BP 135/66   Pulse 90   Temp  98 F (36.7 C)   Resp 17   Ht 5' 10 (1.778 m)   Wt 113 kg   SpO2 92%   BMI 35.74 kg/m  Physical Exam Vitals and nursing note reviewed.  Constitutional:      Appearance: Normal appearance.  HENT:     Head: Normocephalic and atraumatic.  Eyes:     Extraocular Movements: Extraocular movements intact.     Conjunctiva/sclera: Conjunctivae normal.  Cardiovascular:     Rate and Rhythm: Normal rate and regular rhythm.     Pulses: Normal pulses.     Heart sounds: Normal heart sounds.  Pulmonary:     Effort: Pulmonary effort is normal. No respiratory distress.     Breath sounds: Normal breath sounds.  Abdominal:     General: Abdomen is flat.     Palpations: Abdomen  is soft.     Tenderness: There is no abdominal tenderness.  Musculoskeletal:        General: Tenderness (Point tenderness noted over paraspinal muscles of the thoracic and lumbar region on the right side.SABRA) present. No swelling, deformity or signs of injury.     Comments: No midline tenderness noted throughout the thoracic, lumbar spine.  Skin:    General: Skin is warm and dry.     Coloration: Skin is not pale.     Findings: No bruising or erythema.  Neurological:     General: No focal deficit present.     Mental Status: He is alert. Mental status is at baseline.     Motor: No weakness.     Gait: Gait normal.  Psychiatric:        Mood and Affect: Mood normal.     ED Results / Procedures / Treatments   Labs (all labs ordered are listed, but only abnormal results are displayed) Labs Reviewed - No data to display  EKG None  Radiology DG Thoracic Spine 2 View Result Date: 06/26/2023 CLINICAL DATA:  Trauma, fall. EXAM: THORACIC SPINE 2 VIEWS COMPARISON:  None Available. FINDINGS: There is no evidence of thoracic spine fracture. Alignment is normal. There is mild disc space narrowing and endplate osteophyte formation throughout the lower thoracic spine compatible with degenerative change. There are atherosclerotic calcifications of the aorta. IMPRESSION: 1. No acute fracture or malalignment. 2. Mild degenerative changes of the lower thoracic spine. Electronically Signed   By: Greig Pique M.D.   On: 06/26/2023 16:17   DG Lumbar Spine Complete Result Date: 06/26/2023 CLINICAL DATA:  Trauma and fall. EXAM: LUMBAR SPINE - COMPLETE 4+ VIEW COMPARISON:  CT abdomen and pelvis 07/23/2022 FINDINGS: There is no evidence of lumbar spine fracture. Alignment is normal. There is mild disc space narrowing and endplate osteophyte formation throughout the visualized thoracolumbar spine. The aorta is heavily calcified. Sacral stimulator device is present. IMPRESSION: 1. No acute fracture or malalignment. 2.  Mild degenerative changes. Electronically Signed   By: Greig Pique M.D.   On: 06/26/2023 16:17    Procedures Procedures    Medications Ordered in ED Medications  naproxen  (NAPROSYN ) tablet 500 mg (500 mg Oral Given 06/26/23 1426)    ED Course/ Medical Decision Making/ A&P                                 Medical Decision Making Amount and/or Complexity of Data Reviewed Radiology: ordered.  Risk Prescription drug management.   This patient is a 68 year old male who presents to the  ED for concern of back pain post fall.   Differential diagnoses prior to evaluation: The emergent differential diagnosis includes, but is not limited to, ligamentous injury, fracture, herniated disc. This is not an exhaustive differential.   Past Medical History / Co-morbidities / Social History: Cervical radiculitis, degenerative disc disease, diabetes mellitus, multiple falls.   Additional history: Chart reviewed. Pertinent results include: has been seen multiple times for degenerative discs throughout December and is on pain management already.  Lab Tests/Imaging studies: I personally interpreted labs/imaging and the pertinent results include:    X-ray of the lumbar and thoracic spine were unremarkable. I agree with the radiologist interpretation.  Medications: No medications necessary at this time.  I have reviewed the patients home medicines and have made adjustments as needed.   ED Course:  Patient is a [age]-year-old male presents to the ED today after following after finishing a massage and tripping on a ledge to the bathroom to get changed.  Previous medical history of degenerative disc disorder, diabetes, cervical radiculitis.  Has been seen multiple times already in December for chronic back pain.  On exam patient was tender to palpation over the paraspinal muscles of the thoracic and lumbar region.  No tenderness noted over the anterior aspect of his chest, no pain over upper  extremities, lower extremities.  X-rays of the lumbar and thoracic spine were unremarkable.  Low suspicion for fracture at this time as patient is able to ambulate without issue and does not have any sensation loss, weakness, numbness, tingling.  Patient states he already had success number of both muscle relaxers, ibuprofen , Tylenol , narcotics at home for other chronic pain.  He does not want be prescribed any other pain medication at this time.  He also expresses desire to leave quickly, insisting that discharge paperwork be done so he can leave this soon as possible.  Patient's vital signs at this time are stable with low clinical suspicion for any acute injury.  Patient was provided strict return to ER precautions.  Patient expressed agreement and understanding.   Disposition: After consideration of the diagnostic results and the patients response to treatment, I feel that patient benefit from discharge and treatment as above and follow-up with PCP.   emergency department workup does not suggest an emergent condition requiring admission or immediate intervention beyond what has been performed at this time. The plan is: Symptomatic treatment of pain, follow-up PCP if pain persist, return for new or worsening symptoms.. The patient is safe for discharge and has been instructed to return immediately for worsening symptoms, change in symptoms or any other concerns.   Final Clinical Impression(s) / ED Diagnoses Final diagnoses:  Acute right-sided low back pain without sciatica    Rx / DC Orders ED Discharge Orders     None         Beola Terrall GORMAN DEVONNA 06/26/23 2152    Cleotilde Rogue, MD 06/27/23 [phone]

## 2023-09-01 NOTE — Progress Notes (Signed)
 I saw and evaluated the patient, participating in the key portions of the service and reviewing pertinent diagnostic studies and records. I reviewed the resident???s note and agree with the findings and plan.  - Edson Snowball, MD, [facility]

## 2023-09-01 NOTE — Progress Notes (Signed)
 DIVISION OF CARDIOLOGY [facility] , Alan Lin       Date of Service: 09/01/2023  Return Patient Clinic Note  PCP: Referring Provider:  Agustina Greig Bills, MD [address] [place] [address] Phone: [phone] Fax: [phone] Alan Carlin Shove, MD [address] [place],  [place] [address] Phone: [phone] Fax: [phone]   Assessment and Plan:    Mr. Alan Lin is a 68 y.o. male with a history of hypertension and diabetes who presents for follow-up of atrial fibrillation.  Atrial fibrillation Reportedly diagnosed with atrial fibrillation during OSH admission in early 2024. Initially managed with amiodarone  and apixaban , which were stopped when the burden of afib was unclear. Recent Zio showed >3000 brief episodes of SVT (probable afib) in 12 days. Given frequent falls, agree with patient's hesitancy to start therapeutic anticoagulation. Started on dronedarone  with excellent response on repeat Zio - Subclinical hypothyroidism, so amiodarone  was avoided - Started dronedarone  400mg  daily 01/2023 with symptomatic improvement - Continue metoprolol  succinate 25 mg daily   Hypertension - BP at goal today  - Continue losartan 50 mg daily, chlorthalidone 25 mg daily and amlodipine  10 mg daily   HLD - last lipid panel 09/2022 LDL 93 - atorvastatin  10 - repeat lipids at next visit  OSA - on CPAP  The patient was seen and discussed with Dr. Vinie.   Subjective:     Reason for Visit: Evaluation for possible atrial fibrillation   History of Present Illness: Mr. [name] is a 68 y.o. male with a history of hypertension and diabetes who presents as referral to cardiology clinic for evaluation of suspected atrial fibrillation.     Recent Ziopatch showed >3000 episodes of SVT described as probable afib (longest was 16 seconds) over 12 day period. Patient reports being completely asymptomatic with episodes of afib. Continues to be  significantly limited by his frequent falls due to his history of cervical stenosis as well as potentially new diagnosis of NPH.    He is very hesitant to start blood thinner as he continues to have falls with significant bruising even in the absence of therapeutic anticoagulation. He is open to resuming amiodarone , which he tolerated well in the past.  Denies any orthopnea or PND.    Interval events: Feeling well with no other complaints. Continues to have intermittent falls. No chest pain, shortness of breath, PND, orthopnea, palpitations, LE edema worse than baseline. No recurrent notifications about high HR as he had previously   Cardiovascular History: Atrial fibrillation Hypertension Diabetes OSA (on CPAP)  Echo: TTE 10/25/2022   1. Technically difficult study.   2. Concentric left ventricular remodeling with normal global and segmental systolic function (EF >55%).   3. Normal right ventricular size and systolic function.   4. Aortic sclerosis without evidence of significant stenosis or regurgitation.  TTE 07/22/2022 (via [facility]) - Normal LVEF 60-65% - Normal RV size/systolic function - No significant valvular abnormality   Stress test: Treadmill Stress Echo 07/2017 Abnormal stress echo with findings suggestive of at least moderate probability of clinically significant coronary artery disease and/or adverse cardiac events. >1 mm of ST depressions in the inferior and lateral leads during exercise and recovery after attaining 104% of the age-predicted maximal HR. Normal resting left ventricular systolic function with relative hypokinesis of the mid inferoseptal wall after exercise. Poor exercise capacity, limited by fatigue with no exercise induced chest pain or dyspnea at an estimated workload of 7.0 METS.  Cardiac catheterization: LHC 07/2017 Non-flow-limiting  coronary artery disease including 40% proximal RCA lesion and 30% OM1. Normal left ventricular filling pressures  (LVEDP = 12 mm Hg).  Cardiovascular Surgery: None  EP Procedures and Devices: Zio 12/30/22: - Monitoring was performed from 12/02/2022 to 12/16/2022, with 12 days 22 hours of analyzable data. - The predominant rhythm was sinus rhythm, with a rate range of 66-127 bpm (average 96 bpm) - Frequent supraventricular (7.1% burden) and rare ventricular ectopics were recorded, with no ventricular tachyarrhythmias.  - 3033 episodes of probable atrial fibrillation occurred, with the fastest lasting 13 beats and the longest lasting 16 seconds at an average rate of 139 bpm. The average rate was 128 bpm, ranging from 79-240 bpm. Many episdodes occurred during the nighttime and sleeping hours. - Two events were triggered, corresponding to sinus rhythm ranging from 94 to 103 bpm, with occasional supraventricular ectopics. No symptoms were reported.  Non-Invasive Studies: ECG 09/23/2022: Sinus tachycardia (112 bpm) with occasional PACs, inferolateral ST depressions    Relevant Past Medical History: Active Ambulatory Problems    Diagnosis Date Noted  . Backache 07/03/2012  . Benign neoplasm of colon 12/06/2010  . Depressive disorder 07/03/2012  . Folliculitis 05/24/2011  . Gout 05/24/2011  . Hyperlipidemia 05/24/2011  . Hypertension, benign 05/24/2011  . Sleep apnea 05/24/2011  . Obesity 03/04/2013  . Constipation 03/10/2014  . 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 Tachycardia 03/30/2015  . Abdominal wall mass of right flank 03/15/2016  . Diabetes mellitus with no complication (CMS-HCC) 03/15/2016  . Left shoulder pain 11/29/2016  . Abnormal stress echocardiogram 08/09/2017  . Right hip pain 07/30/2018  . Primary osteoarthritis of both knees 07/30/2018  . Anxiety 07/16/2019  . Acute pain of right shoulder 07/16/2019  . 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 Spondylolisthesis of cervical region 02/06/2020  . Trigger thumb, right thumb 03/31/2020  . At high risk for falls 08/23/2021  .  BMI 37.0-37.9, adult 08/23/2021  . 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 Stress and adjustment reaction 05/30/2022  . Chronic bilateral low back pain 09/01/2022  . Left knee pain 09/02/2022  . Paroxysmal atrial fibrillation (CMS-HCC) 01/07/2023  . Impaired gait and mobility 03/05/2023  . Urinary incontinence 03/05/2023  . Other constipation 04/02/2023  . Abrasion of left knee 08/29/2023  . Trigger index finger of right hand 08/29/2023   Resolved Ambulatory Problems    Diagnosis Date Noted  . Cervical cord compression with myelopathy (CMS-HCC) 02/06/2020   Past Medical History:  Diagnosis Date  . Arthritis   . CPAP (continuous positive airway pressure) dependence   . Diabetes mellitus (CMS-HCC) 02/2016  . Hypertension   . Joint pain   . Osteoarthritis     Patient's Medications  New Prescriptions   No medications on file  Previous Medications   AMLODIPINE  (NORVASC ) 10 MG TABLET    Take 1 tablet (10 mg total) by mouth daily.   ATORVASTATIN  (LIPITOR) 10 MG TABLET    Take 1 tablet (10 mg total) by mouth daily.   BACITRACIN 500 UNIT/GRAM OINTMENT    Apply to affected area daily   BLOOD-GLUCOSE METER,CONTINUOUS (DEXCOM G6 RECEIVER) MISC    1 each by Miscellaneous route in the morning. Dispense 1 receiver annually.   Sent to [facility].   BLOOD-GLUCOSE SENSOR (DEXCOM G6 SENSOR) DEVI    1 each by Miscellaneous route in the morning. [facility].   BLOOD-GLUCOSE TRANSMITTER (DEXCOM G6 TRANSMITTER) DEVI    1 each by Miscellaneous route Every three (3) months. [facility] pharmacy  CHLORTHALIDONE (HYGROTON) 25 MG TABLET    Take 1 tablet (25 mg total) by mouth daily.   CHOLECALCIFEROL, VITAMIN D3, (VITAMIN D3 ORAL)    Take by mouth.   CYCLOBENZAPRINE  (FLEXERIL ) 10 MG TABLET    Take 1 tablet (10 mg total) by mouth nightly as needed for muscle spasms. Frequency:QHS   Dosage:10   MG  Instructions:  Note:Dose: 10MG    DICLOFENAC  SODIUM (VOLTAREN ) 1 % GEL    Apply 2 g topically four (4) times  a day.   DOCOSAHEXAENOIC ACID/EPA (FISH OIL ORAL)    Take by mouth.   DOCUSATE SODIUM  (COLACE) 100 MG CAPSULE    Take 1 capsule (100 mg total) by mouth daily.   DRONEDARONE  (MULTAQ ) 400 MG TABLET    Take 1 tablet (400 mg total) by mouth in the morning and 1 tablet (400 mg total) in the evening. Take with meals.   DULOXETINE  (CYMBALTA ) 60 MG CAPSULE    Take 1 capsule (60 mg total) by mouth daily.   GABAPENTIN  (NEURONTIN ) 100 MG CAPSULE    Take 1 capsule (100 mg total) by mouth Three (3) times a day for 7 days. Short term use for post operative pain management   GABAPENTIN  (NEURONTIN ) 300 MG CAPSULE    TAKE 5 CAPSULE BY MOUTH EVERY DAY EVERY NIGHT AS NEEDED May also take 1-2 pills additional 2 times a day   GLYCERIN, ADULT, (FLEET GLYCERIN, ADULT,) SUPP    Insert 1 suppository into the rectum daily as needed.   JARDIANCE  10 MG TABLET    Take 1 tablet (10 mg total) by mouth daily.   LOSARTAN (COZAAR) 50 MG TABLET    Take 1 tablet (50 mg total) by mouth daily.   METHOCARBAMOL  (ROBAXIN ) 500 MG TABLET    Take 1 tablet (500 mg total) by mouth Three (3) times a day.   METOPROLOL  SUCCINATE (TOPROL -XL) 25 MG 24 HR TABLET    Take 1 tablet (25 mg total) by mouth daily.   MULTIVITAMIN WITH MINERALS TABLET    Take 1 tablet by mouth.   POLYETHYLENE GLYCOL (MIRALAX ) 17 GRAM PACKET    Take 17 g by mouth two (2) times a day.   POTASSIUM/MAGNESIUM  (MAGNESIUM -POTASSIUM ORAL)    Take by mouth.   SEMAGLUTIDE  (RYBELSUS ) 7 MG TAB    Take 1 tablet by mouth in the morning.   SENNA (SENOKOT) 8.6 MG TABLET    Take 1 tablet by mouth daily.   TAMSULOSIN  (FLOMAX ) 0.4 MG CAPSULE    Take 1 capsule (0.4 mg total) by mouth daily.   TRAZODONE  (DESYREL ) 50 MG TABLET    Take 1 tablet (50 mg total) by mouth nightly.  Modified Medications   No medications on file  Discontinued Medications   No medications on file    Allergies: Allergies  Allergen Reactions  . Lisinopril Other (See Comments) and Cough    Other reaction(s):  Cough cough  . Percocet [Oxycodone-Acetaminophen ]     Constipation   . Metformin Diarrhea and Nausea And Vomiting  . Morphine Itching  . Opioids - Morphine Analogues Itching    Social History: He  reports that he has never smoked. He has been exposed to tobacco smoke. He has never used smokeless tobacco. He reports current alcohol use. He reports that he does not use drugs.  Family History: His family history includes Alcohol abuse in his father; Alzheimer's disease in his mother; Aneurysm in his father; Glaucoma in his brother; Hypertension in his father, maternal aunt, maternal  aunt, and mother; No Known Problems in his brother, maternal grandfather, maternal grandmother, paternal aunt, paternal grandfather, paternal grandmother, and sister; Stroke in his father.  Review of Systems 10 systems were reviewed and negative except as noted in HPI.    Objective:     Physical Exam BP 126/65 (BP Site: L Arm, BP Position: Sitting, BP Cuff Size: Medium) Comment: has not taken meds yet today  Pulse 94   Ht 177.8 cm (5' 10)   Wt (!) 113.9 kg (251 lb)   SpO2 94%   BMI 36.01 kg/m   Wt Readings from Last 3 Encounters:  09/01/23 (!) 113.9 kg (251 lb)  07/14/23 (!) 116.1 kg (256 lb)  06/12/23 (!) 117.8 kg (259 lb 9.6 oz)    General:  Alert, well appearing, no distress.  HEENT:  MMM.  Neck: JVP not appreciably elevated  Lungs:   CTAB bilaterally with normal WOB.  Heart:  Regular rhythm, normal rate, normal S1/S2. No murmurs, gallops, or rubs  Abdomen:   Soft, non-tender, non-distended.  Extremities: 2+ radial pulses bilaterally. Trace edema bilaterally.  Skin: No lesions/rashes.  Neurologic: No focal deficits.    Most recent labs   Lab Results  Component Value Date   WBC 10.6 03/21/2023   RBC 5.32 03/21/2023   HGB 15.0 03/21/2023   HCT 44.2 03/21/2023   MCV 83.1 03/21/2023   MCH 28.2 03/21/2023   MCHC 33.9 03/21/2023   RDW 13.5 03/21/2023   PLT 283 03/21/2023   MPV 8.5  03/21/2023    Lab Results  Component Value Date   NA 136 08/29/2023   K 3.4 08/29/2023   CL 98 05/23/2023   CO2 28.0 05/23/2023   BUN 18 05/23/2023   CREATININE 0.79 08/29/2023   GFR >= 60 07/03/2012   GLU 177 (H) 05/23/2023   CALCIUM  10.7 (H) 05/23/2023   ALBUMIN 4.0 03/21/2023    Lab Results  Component Value Date   CHOL 187 09/23/2022   CHOL 128 03/30/2015   CHOL 149 07/03/2012   Lab Results  Component Value Date   HDL 27 (L) 09/23/2022   HDL 28 (L) 03/30/2015   HDL 32 (L) 07/03/2012   Lab Results  Component Value Date   LDL 93 09/23/2022   Lab Results  Component Value Date   TRIG 334 (H) 09/23/2022   TRIG 153 (H) 08/23/2010   No components found for: [facility]  Lab Results  Component Value Date   PT 12.1 01/27/2020   INR 1.02 01/27/2020     Lang Alert, MD, MPH Cardiology Fellow

## 2023-09-08 ENCOUNTER — Emergency Department (HOSPITAL_COMMUNITY)
Admission: EM | Admit: 2023-09-08 | Discharge: 2023-09-09 | Disposition: A | Discharge status: Home / Self Care | Attending: Emergency Medicine | Admitting: Emergency Medicine

## 2023-09-08 ENCOUNTER — Other Ambulatory Visit: Payer: Self-pay

## 2023-09-08 ENCOUNTER — Encounter (HOSPITAL_COMMUNITY): Payer: Self-pay | Admitting: Emergency Medicine

## 2023-09-08 DIAGNOSIS — M549 Dorsalgia, unspecified: Secondary | ICD-10-CM | POA: Diagnosis not present

## 2023-09-08 DIAGNOSIS — M7989 Other specified soft tissue disorders: Secondary | ICD-10-CM | POA: Diagnosis not present

## 2023-09-08 DIAGNOSIS — E119 Type 2 diabetes mellitus without complications: Secondary | ICD-10-CM | POA: Insufficient documentation

## 2023-09-08 DIAGNOSIS — R531 Weakness: Secondary | ICD-10-CM | POA: Diagnosis not present

## 2023-09-08 DIAGNOSIS — Z7984 Long term (current) use of oral hypoglycemic drugs: Secondary | ICD-10-CM | POA: Diagnosis not present

## 2023-09-08 DIAGNOSIS — Z7901 Long term (current) use of anticoagulants: Secondary | ICD-10-CM | POA: Insufficient documentation

## 2023-09-08 DIAGNOSIS — Z79899 Other long term (current) drug therapy: Secondary | ICD-10-CM | POA: Diagnosis not present

## 2023-09-08 DIAGNOSIS — M79604 Pain in right leg: Secondary | ICD-10-CM | POA: Diagnosis present

## 2023-09-08 DIAGNOSIS — R600 Localized edema: Secondary | ICD-10-CM | POA: Insufficient documentation

## 2023-09-08 DIAGNOSIS — R29898 Other symptoms and signs involving the musculoskeletal system: Secondary | ICD-10-CM

## 2023-09-08 DIAGNOSIS — I10 Essential (primary) hypertension: Secondary | ICD-10-CM | POA: Insufficient documentation

## 2023-09-08 NOTE — ED Triage Notes (Addendum)
 Pt in with R leg pain, swelling and weakness x few days. Pt does not recall any injury, just has noticed increasing difficulty getting around at home and is having swelling to the RLE, having trouble bearing weight on it. Takes Multaq for afib  VS en route: CBG 211 16RR 162 palp BP 94%RA 102HR

## 2023-09-08 NOTE — ED Provider Triage Note (Signed)
 Emergency Medicine Provider Triage Evaluation Note  Alan Lin , a 68 y.o. male  was evaluated in triage.  Pt complains of right sided lower extremity weakness.  Onset earlier this week.  States that he feels like his leg won't carry him.  Denies trauma.    Review of Systems  Positive: Right LE weakness Negative: Slurred speech, numbness  Physical Exam  BP 129/68 (BP Location: Left Arm)   Pulse (!) 103   Temp 98.3 F (36.8 C) (Oral)   Resp 16   Wt 113 kg   SpO2 95%   BMI 35.74 kg/m  Gen:   Awake, no distress   Resp:  Normal effort  MSK:   Moves extremities without difficulty  Other:    Medical Decision Making  Medically screening exam initiated at 11:51 PM.  Appropriate orders placed.  Alan Lin was informed that the remainder of the evaluation will be completed by another provider, this initial triage assessment does not replace that evaluation, and the importance of remaining in the ED until their evaluation is complete.     Roxy Horseman, PA-C 09/08/23 2353

## 2023-09-09 ENCOUNTER — Emergency Department (HOSPITAL_COMMUNITY)

## 2023-09-09 ENCOUNTER — Emergency Department (HOSPITAL_BASED_OUTPATIENT_CLINIC_OR_DEPARTMENT_OTHER)

## 2023-09-09 DIAGNOSIS — M7989 Other specified soft tissue disorders: Secondary | ICD-10-CM

## 2023-09-09 LAB — URINALYSIS, ROUTINE W REFLEX MICROSCOPIC
Bilirubin Urine: NEGATIVE
Glucose, UA: >=500 mg/dL — AB
Hgb urine dipstick: NEGATIVE
Ketones, ur: NEGATIVE mg/dL
Leukocytes,Ua: NEGATIVE
Nitrite: NEGATIVE
Protein, ur: NEGATIVE mg/dL
Specific Gravity, Urine: 1.01 (ref 1.005–1.030)
pH: 7 (ref 5.0–8.0)

## 2023-09-09 LAB — CBC
HCT: 47.6 % (ref 39.0–52.0)
Hemoglobin: 15.6 g/dL (ref 13.0–17.0)
MCH: 28 pg (ref 26.0–34.0)
MCHC: 32.8 g/dL (ref 30.0–36.0)
MCV: 85.3 fL (ref 80.0–100.0)
Platelets: 324 10*3/uL (ref 150–400)
RBC: 5.58 MIL/uL (ref 4.22–5.81)
RDW: 13.2 % (ref 11.5–15.5)
WBC: 13.4 10*3/uL — ABNORMAL HIGH (ref 4.0–10.5)
nRBC: 0 % (ref 0.0–0.2)

## 2023-09-09 LAB — DIFFERENTIAL
Abs Immature Granulocytes: 0.1 10*3/uL — ABNORMAL HIGH (ref 0.00–0.07)
Basophils Absolute: 0.1 10*3/uL (ref 0.0–0.1)
Basophils Relative: 1 %
Eosinophils Absolute: 0.1 10*3/uL (ref 0.0–0.5)
Eosinophils Relative: 1 %
Immature Granulocytes: 1 %
Lymphocytes Relative: 16 %
Lymphs Abs: 2.2 10*3/uL (ref 0.7–4.0)
Monocytes Absolute: 1.4 10*3/uL — ABNORMAL HIGH (ref 0.1–1.0)
Monocytes Relative: 10 %
Neutro Abs: 9.6 10*3/uL — ABNORMAL HIGH (ref 1.7–7.7)
Neutrophils Relative %: 71 %

## 2023-09-09 LAB — COMPREHENSIVE METABOLIC PANEL
ALT: 19 U/L (ref 0–44)
AST: 16 U/L (ref 15–41)
Albumin: 3.7 g/dL (ref 3.5–5.0)
Alkaline Phosphatase: 57 U/L (ref 38–126)
Anion gap: 13 (ref 5–15)
BUN: 12 mg/dL (ref 8–23)
CO2: 24 mmol/L (ref 22–32)
Calcium: 9.6 mg/dL (ref 8.9–10.3)
Chloride: 99 mmol/L (ref 98–111)
Creatinine, Ser: 0.93 mg/dL (ref 0.61–1.24)
GFR, Estimated: >60 mL/min (ref >60)
Glucose, Bld: 184 mg/dL — ABNORMAL HIGH (ref 70–99)
Potassium: 3.3 mmol/L — ABNORMAL LOW (ref 3.5–5.1)
Sodium: 136 mmol/L (ref 135–145)
Total Bilirubin: 1.4 mg/dL — ABNORMAL HIGH (ref 0.0–1.2)
Total Protein: 7 g/dL (ref 6.5–8.1)

## 2023-09-09 LAB — URINALYSIS, MICROSCOPIC (REFLEX)

## 2023-09-09 LAB — APTT: aPTT: 28 s (ref 24–36)

## 2023-09-09 LAB — I-STAT CHEM 8, ED
BUN: 12 mg/dL (ref 8–23)
Calcium, Ion: 1.02 mmol/L — ABNORMAL LOW (ref 1.15–1.40)
Chloride: 99 mmol/L (ref 98–111)
Creatinine, Ser: 0.9 mg/dL (ref 0.61–1.24)
Glucose, Bld: 176 mg/dL — ABNORMAL HIGH (ref 70–99)
HCT: 48 % (ref 39.0–52.0)
Hemoglobin: 16.3 g/dL (ref 13.0–17.0)
Potassium: 3.2 mmol/L — ABNORMAL LOW (ref 3.5–5.1)
Sodium: 134 mmol/L — ABNORMAL LOW (ref 135–145)
TCO2: 23 mmol/L (ref 22–32)

## 2023-09-09 LAB — RAPID URINE DRUG SCREEN, HOSP PERFORMED
Amphetamines: NOT DETECTED
Barbiturates: NOT DETECTED
Benzodiazepines: NOT DETECTED
Cocaine: NOT DETECTED
Opiates: NOT DETECTED
Tetrahydrocannabinol: NOT DETECTED

## 2023-09-09 LAB — ETHANOL: Alcohol, Ethyl (B): <10 mg/dL (ref <10)

## 2023-09-09 LAB — PROTIME-INR
INR: 1.1 (ref 0.8–1.2)
Prothrombin Time: 14 s (ref 11.4–15.2)

## 2023-09-09 NOTE — ED Provider Notes (Signed)
 Care of patient received from prior provider at 5:39 PM, please see their note for complete H/P and care plan.  Received handoff per ED course.  Clinical Course as of 09/09/23 1739  Sat Sep 09, 2023  1524 Stable HO SZ 89 YOM with back pain and weakness. Urology follows for bladder stimulator.  Needs MRI for lspine and neuology Had injections recently On way back from apt yesterday he was too weak to use his left leg. MRI for CVA vs spinal etiology  [CC]  1722 Call from MRI and they stated that they cannot perform a MRI without his controller for his stimulator being in hand.  He is coming from an independent living facility and has no one who is able to go get his controller. Patient stated that he understood risk of missed stroke or epidural/spinal problem but wants to be released from the hospital immediately as he does not want to wait for further workup. [CC]    Clinical Course User Index [CC] Glyn Ade, MD   Disposition:  Patient is requesting discharge at this time.  Given patient's understanding of risk of severe missed diagnosis based on limitations of today's evaluation and risk of interval worsening of disease including life or limb threatening pathology, will participate in shared medical decision making and patient directed discharge at this time.  Patient is welcome to return for further diagnostic evaluation/therapeutic management at any time.     Glyn Ade, MD 09/09/23 1739

## 2023-09-09 NOTE — ED Notes (Signed)
 Pt called out expressing concern no one had come to see him for an hr.  I have been checking on him but unsure about the Doctor.  EDP made aware.

## 2023-09-09 NOTE — Progress Notes (Signed)
 VASCULAR LAB    Right lower extremity venous duplex has been performed.  See CV proc for preliminary results.  Relayed results to Dr. Deretha Emory via secure chat  Sherren Kerns, RVT 09/09/2023, 9:56 AM

## 2023-09-09 NOTE — ED Notes (Signed)
 Patient transported to vascular.

## 2023-09-09 NOTE — ED Notes (Signed)
 After a long wait for info on this pt's bladder stimulator, MRI asked if he had the controller for his device and he did not so we had to DC him with instructions to come back at his convenience with all necessary devices to hopefully accomplish previously ordered MRI's.

## 2023-09-09 NOTE — ED Triage Notes (Addendum)
 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  He is being seen by Dr. Rada Hay (neurosurgery?) through [facility] and PCP Amy While for his neurological issues. Weakness and leg pain is a worsening of existing problems for last 2 days. PT has had to call EMS the last 2 days for help with movement. Pt endorses pain in right ankle and foot.  PT lives at [facility] independent living.

## 2023-09-09 NOTE — ED Notes (Signed)
 Pt has a neural stimulator near his sacrum. It is [employer] brand and placed by a Dr. McDermot per the pt.

## 2023-09-09 NOTE — ED Notes (Signed)
 Pt sats fluctuate between 88-95%.  PT is no distress and does not endorse SOB.

## 2023-09-09 NOTE — ED Provider Notes (Addendum)
 [place] EMERGENCY DEPARTMENT AT [facility] Provider Note   CSN: [id number] Arrival date & time: 09/08/23  2238     History  Chief Complaint  Patient presents with   Extremity Weakness   Leg Pain    Alan Lin is a 68 y.o. male.  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  Patient is seen by neurosurgery at [facility].  For the past 4 days despite having long-term pain in his back and and down the left leg now is having significant trouble getting around because of pain since his foot just will not work well.  Grossly motor strength for flexion extension of the toes is reasonable strength.  Maybe a little weaker than the left foot.  Also a lot of edema to the left leg.  No other weakness.'s all been just the leg will not work right he says.  Patient seen in February by neurosurgery at [facility] he did some injections in the back.  Past medical history seen for sleep apnea diabetes arthritis.  Patient currently stays at assisted living at [facility].  Patient denies any incontinence.  Denies any numbness to the right foot denies any numbness to the left foot or any pain in the left foot or weakness to the left foot denies any weakness or numbness to the upper extremities.  No speech problems no vision problems.  Patient had follow-up at [facility] yesterday but caught in traffic and missed his appointment.  On the way back he needed gas.  But he was unable to really navigate standing getting out of the car and have somebody pump gas for him.  Also when he got home could not get out of his vehicle and into his assisted living quarters.  So he called EMS and they assisted him.  Then he could not get off the couch when he needed to go to the bathroom so he called EMS again and they brought him in.  Medical history sniffing for atrial fibs hypertension sleep apnea diabetes and arthritis.  Patient never used tobacco  products.       Home Medications Prior to Admission medications   Medication Sig Start Date End Date Taking? Authorizing Provider  acetaminophen (TYLENOL) 325 MG tablet Take 1 tablet (325 mg total) by mouth every 6 (six) hours as needed for mild pain (or Fever >/= 101). 07/28/22   Elgergawy, Leana Roe, MD  amiodarone (PACERONE) 200 MG tablet Amio 400 mg BID for 5 days. Then decrease to  200 mg BID for 1 week , then 200 mg qDay thereafter. 07/28/22   Elgergawy, Leana Roe, MD  amLODipine (NORVASC) 10 MG tablet Take 10 mg by mouth daily. 09/10/22   [provider]  apixaban (ELIQUIS) 5 MG TABS tablet Take 1 tablet (5 mg total) by mouth 2 (two) times daily. 07/28/22   Elgergawy, Leana Roe, MD  chlorthalidone (HYGROTON) 25 MG tablet Take 25 mg by mouth daily. 08/13/22   [provider]  cyclobenzaprine (FLEXERIL) 10 MG tablet Take 10 mg by mouth daily as needed for muscle spasms.    [provider]  diclofenac Sodium (VOLTAREN) 1 % GEL Apply 2 g topically 4 (four) times daily as needed (pain). 09/20/19   [provider]  DULoxetine (CYMBALTA) 20 MG capsule Take 60 mg by mouth daily. 08/28/19   [provider]  fentaNYL (DURAGESIC) 50 MCG/HR Place 1 patch onto the skin every  3 (three) days. 09/08/22   Horton, Mayer Masker, MD  gabapentin (NEURONTIN) 300 MG capsule Take 300-1,500 mg by mouth daily. Take 300mg  (1 capsule) by mouth in the mornings and 1500mg  (5 capsules) in the evening. 08/03/16   [provider]  JARDIANCE 10 MG TABS tablet Take 10 mg by mouth daily. 08/09/22   [provider]  losartan (COZAAR) 50 MG tablet Take 50 mg by mouth daily. 07/14/16   [provider]  metoprolol succinate (TOPROL-XL) 50 MG 24 hr tablet Take 1 tablet (50 mg total) by mouth 2 (two) times daily. Take with or immediately following a meal. 07/28/22   Elgergawy, Leana Roe, MD  Multiple Vitamins-Minerals (MULTIVITAMIN WITH MINERALS) tablet Take 1 tablet by mouth  daily.    [provider]  Semaglutide (RYBELSUS) 7 MG TABS Take by mouth.    [provider]  simvastatin (ZOCOR) 20 MG tablet Take 20 mg by mouth daily. 07/07/16   [provider]  tamsulosin (FLOMAX) 0.4 MG CAPS capsule Take 0.4 mg by mouth daily. 09/28/22   [provider]      Allergies    Metformin, Oxycodone, Oxycodone-acetaminophen, Lisinopril, Morphine, and Morphine and codeine    Review of Systems   Review of Systems  Constitutional:  Negative for chills and fever.  HENT:  Negative for ear pain and sore throat.   Eyes:  Negative for pain and visual disturbance.  Respiratory:  Negative for cough and shortness of breath.   Cardiovascular:  Negative for chest pain and palpitations.  Gastrointestinal:  Negative for abdominal pain and vomiting.  Genitourinary:  Negative for dysuria and hematuria.  Musculoskeletal:  Positive for back pain. Negative for arthralgias.  Skin:  Negative for color change and rash.  Neurological:  Positive for weakness. Negative for seizures and syncope.  All other systems reviewed and are negative.   Physical Exam Updated Vital Signs BP 130/61   Pulse 86   Temp 98.3 F (36.8 C)   Resp 19   Wt 113 kg   SpO2 90%   BMI 35.74 kg/m  Physical Exam Vitals and nursing note reviewed.  Constitutional:      General: He is not in acute distress.    Appearance: Normal appearance. He is well-developed.  HENT:     Head: Normocephalic and atraumatic.  Eyes:     Extraocular Movements: Extraocular movements intact.     Conjunctiva/sclera: Conjunctivae normal.     Pupils: Pupils are equal, round, and reactive to light.  Cardiovascular:     Rate and Rhythm: Normal rate and regular rhythm.     Heart sounds: No murmur heard. Pulmonary:     Effort: Pulmonary effort is normal. No respiratory distress.     Breath sounds: Normal breath sounds.  Abdominal:     Palpations: Abdomen is soft.     Tenderness: There is no  abdominal tenderness.  Musculoskeletal:        General: No swelling or tenderness.     Cervical back: Neck supple.     Right lower leg: Edema present.     Comments: Significant swelling to the right lower extremity.  Is warm good cap refill dorsalis pedis pulses 2+.  Sensations intact.  Seems to be a little bit of weakness to flexion and extension of the big toe.  But certainly strength is there.  Stronger on the left lower extremity.  Sensation seems to be equal between the 2 feet.  Skin:    General: Skin is warm  and dry.     Capillary Refill: Capillary refill takes less than 2 seconds.  Neurological:     General: No focal deficit present.     Mental Status: He is alert and oriented to person, place, and time.     Cranial Nerves: No cranial nerve deficit.     Sensory: No sensory deficit.     Motor: Weakness present.  Psychiatric:        Mood and Affect: Mood normal.     ED Results / Procedures / Treatments   Labs (all labs ordered are listed, but only abnormal results are displayed) Labs Reviewed  CBC - Abnormal; Notable for the following components:      Result Value   WBC 13.4 (*)    All other components within normal limits  DIFFERENTIAL - Abnormal; Notable for the following components:   Neutro Abs 9.6 (*)    Monocytes Absolute 1.4 (*)    Abs Immature Granulocytes 0.10 (*)    All other components within normal limits  COMPREHENSIVE METABOLIC PANEL - Abnormal; Notable for the following components:   Potassium 3.3 (*)    Glucose, Bld 184 (*)    Total Bilirubin 1.4 (*)    All other components within normal limits  URINALYSIS, ROUTINE W REFLEX MICROSCOPIC - Abnormal; Notable for the following components:   Glucose, UA >=500 (*)    All other components within normal limits  URINALYSIS, MICROSCOPIC (REFLEX) - Abnormal; Notable for the following components:   Bacteria, UA RARE (*)    All other components within normal limits  I-STAT CHEM 8, ED - Abnormal; Notable for the  following components:   Sodium 134 (*)    Potassium 3.2 (*)    Glucose, Bld 176 (*)    Calcium, Ion 1.02 (*)    All other components within normal limits  ETHANOL  PROTIME-INR  APTT  RAPID URINE DRUG SCREEN, HOSP PERFORMED    EKG None  Radiology CT HEAD WO CONTRAST Result Date: 09/09/2023 CLINICAL DATA:  Weakness EXAM: CT HEAD WITHOUT CONTRAST TECHNIQUE: Contiguous axial images were obtained from the base of the skull through the vertex without intravenous contrast. RADIATION DOSE REDUCTION: This exam was performed according to the departmental dose-optimization program which includes automated exposure control, adjustment of the mA and/or kV according to patient size and/or use of iterative reconstruction technique. COMPARISON:  07/21/2022 FINDINGS: Brain: No evidence of acute infarction, hemorrhage, hydrocephalus, extra-axial collection or mass lesion/mass effect. Vascular: No hyperdense vessel or unexpected calcification. Skull: Normal. Negative for fracture or focal lesion. Sinuses/Orbits: No acute finding. Other: None. IMPRESSION: No acute intracranial abnormality noted. Electronically Signed   By: Alcide Clever M.D.   On: 09/09/2023 01:07    Procedures Procedures    Medications Ordered in ED Medications - No data to display  ED Course/ Medical Decision Making/ A&P                                 Medical Decision Making Amount and/or Complexity of Data Reviewed Radiology: ordered.   Patient seems to have a little bit of weakness to the right lower extremity.  But no sensory deficit.  Has chronic back pain with radiation into the leg.  Seen here in January and was diagnosed with sciatica.  Also followed by neurosurgery at [facility] in February they did some injections in the back.  No other strokelike symptoms.  But it is possible this  weakness could be secondary to stroke or could be secondary to a radiculopathy.  No good explanation for the swelling of the leg.  So we  will going get Doppler study will get MRI brain just to rule out possible stroke as the cause.  And will get MRI lumbar back very well could be radicular pain as well.  No concerns for cauda equina.  Patient's alcohol less than 10 INR 1 PTT normal white count 13.4 hemoglobin 15.6 platelets are 324 differential shows increase in absolute neutrophils.  Complete metabolic panel potassium 3.3 otherwise all normal.  Renal function normal.  Urine drug screen negative.  Urinalysis negative CT head without any acute findings.  EKG shows atrial fibrillation with a heart rate of 109 and a prolonged QT.  Patient is on Eliquis.  Patient is on Neurontin and patient is on Toprol XL.  Known to have a history of atrial fibs.  Patient has a bladder stimulator.  Was put in by [facility] urology in July 2024.  It is not InterStim device.  Discussed with Dr. Sande Brothers on-call they do not have a device number however it is MRI compatible.  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  MRI is probably going to need the actual card with model number and all that before they will do the MRI but it does sound like it is doable.  Final Clinical Impression(s) / ED Diagnoses Final diagnoses:  Right leg pain  Right leg weakness  Right leg swelling    Rx / DC Orders ED Discharge Orders     None         Vanetta Mulders, MD 09/09/23 6578    Vanetta Mulders, MD 09/09/23 1352

## 2023-09-12 ENCOUNTER — Encounter (HOSPITAL_COMMUNITY)

## 2023-09-12 ENCOUNTER — Emergency Department (HOSPITAL_COMMUNITY)

## 2023-09-12 ENCOUNTER — Encounter (HOSPITAL_COMMUNITY): Payer: Self-pay

## 2023-09-12 ENCOUNTER — Other Ambulatory Visit: Payer: Self-pay

## 2023-09-12 ENCOUNTER — Emergency Department (HOSPITAL_COMMUNITY)
Admission: EM | Admit: 2023-09-12 | Discharge: 2023-09-12 | Disposition: A | Discharge status: Home / Self Care | Attending: Emergency Medicine | Admitting: Emergency Medicine

## 2023-09-12 DIAGNOSIS — G8929 Other chronic pain: Secondary | ICD-10-CM | POA: Diagnosis not present

## 2023-09-12 DIAGNOSIS — Z7984 Long term (current) use of oral hypoglycemic drugs: Secondary | ICD-10-CM | POA: Insufficient documentation

## 2023-09-12 DIAGNOSIS — M10071 Idiopathic gout, right ankle and foot: Secondary | ICD-10-CM | POA: Insufficient documentation

## 2023-09-12 DIAGNOSIS — I10 Essential (primary) hypertension: Secondary | ICD-10-CM | POA: Diagnosis not present

## 2023-09-12 DIAGNOSIS — Z7901 Long term (current) use of anticoagulants: Secondary | ICD-10-CM | POA: Insufficient documentation

## 2023-09-12 DIAGNOSIS — Z79899 Other long term (current) drug therapy: Secondary | ICD-10-CM | POA: Insufficient documentation

## 2023-09-12 DIAGNOSIS — M549 Dorsalgia, unspecified: Secondary | ICD-10-CM | POA: Diagnosis present

## 2023-09-12 DIAGNOSIS — E119 Type 2 diabetes mellitus without complications: Secondary | ICD-10-CM | POA: Diagnosis not present

## 2023-09-12 DIAGNOSIS — M109 Gout, unspecified: Secondary | ICD-10-CM

## 2023-09-12 DIAGNOSIS — M545 Low back pain, unspecified: Secondary | ICD-10-CM | POA: Insufficient documentation

## 2023-09-12 LAB — CBC WITH DIFFERENTIAL/PLATELET
Abs Immature Granulocytes: 0.07 10*3/uL (ref 0.00–0.07)
Basophils Absolute: 0.1 10*3/uL (ref 0.0–0.1)
Basophils Relative: 1 %
Eosinophils Absolute: 0 10*3/uL (ref 0.0–0.5)
Eosinophils Relative: 1 %
HCT: 46.9 % (ref 39.0–52.0)
Hemoglobin: 15.8 g/dL (ref 13.0–17.0)
Immature Granulocytes: 1 %
Lymphocytes Relative: 15 %
Lymphs Abs: 1 10*3/uL (ref 0.7–4.0)
MCH: 27.9 pg (ref 26.0–34.0)
MCHC: 33.7 g/dL (ref 30.0–36.0)
MCV: 82.9 fL (ref 80.0–100.0)
Monocytes Absolute: 1 10*3/uL (ref 0.1–1.0)
Monocytes Relative: 15 %
Neutro Abs: 4.6 10*3/uL (ref 1.7–7.7)
Neutrophils Relative %: 67 %
Platelets: 305 10*3/uL (ref 150–400)
RBC: 5.66 MIL/uL (ref 4.22–5.81)
RDW: 13.2 % (ref 11.5–15.5)
WBC: 6.8 10*3/uL (ref 4.0–10.5)
nRBC: 0 % (ref 0.0–0.2)

## 2023-09-12 LAB — BASIC METABOLIC PANEL
Anion gap: 14 (ref 5–15)
BUN: 20 mg/dL (ref 8–23)
CO2: 25 mmol/L (ref 22–32)
Calcium: 9.6 mg/dL (ref 8.9–10.3)
Chloride: 96 mmol/L — ABNORMAL LOW (ref 98–111)
Creatinine, Ser: 1.04 mg/dL (ref 0.61–1.24)
GFR, Estimated: >60 mL/min (ref >60)
Glucose, Bld: 156 mg/dL — ABNORMAL HIGH (ref 70–99)
Potassium: 3 mmol/L — ABNORMAL LOW (ref 3.5–5.1)
Sodium: 135 mmol/L (ref 135–145)

## 2023-09-12 MED ORDER — PREDNISONE 10 MG (21) PO TBPK: ORAL_TABLET | Freq: Every day | ORAL | 0 refills | Status: AC

## 2023-09-12 MED ORDER — POTASSIUM CHLORIDE CRYS ER 20 MEQ PO TBCR
40.0000 meq | EXTENDED_RELEASE_TABLET | Freq: Once | ORAL | Status: AC
  Administered 2023-09-12: 40 meq via ORAL
  Filled 2023-09-12: qty 2

## 2023-09-12 NOTE — ED Triage Notes (Signed)
 Patient BIB [facility] from [facility] for increase in baseline back and leg pain since missing appointment for shot. Patient reports now it has moved to his leg and foot, unable to bear weight without significant pain. Seen recently for same.

## 2023-09-12 NOTE — ED Provider Notes (Signed)
 [place] EMERGENCY DEPARTMENT AT [facility] Provider Note   CSN: [id number] Arrival date & time: 09/12/23  1704     History {Add pertinent medical, surgical, social history, OB history to HPI:1} Chief Complaint  Patient presents with  . Back Pain    Alan Lin is a 68 y.o. male with PMH as listed below who presents BIB [facility] from [facility] for increase in baseline back and leg pain since missing appointment for shot. Patient reports now it has moved to his leg and foot, unable to bear weight without significant pain. Seen recently for same.   At recent visit on 09/09/23, patient was supposed to get MRI brain, L-spine and doppler RLE study. He could not get it howveer because he has an implanted urologic device that is indeed MRI compatible but it required a remote control that patient did not have with him. Patient presents back to ED today for continued workup and has brought with him all of the necessary devices for his implanted device.   Past Medical History:  Diagnosis Date  . Arthritis   . Diabetes mellitus without complication (HCC)   . Hypertension   . Sleep apnea        Home Medications Prior to Admission medications   Medication Sig Start Date End Date Taking? Authorizing Provider  acetaminophen (TYLENOL) 325 MG tablet Take 1 tablet (325 mg total) by mouth every 6 (six) hours as needed for mild pain (or Fever >/= 101). 07/28/22   Elgergawy, Leana Roe, MD  amiodarone (PACERONE) 200 MG tablet Amio 400 mg BID for 5 days. Then decrease to  200 mg BID for 1 week , then 200 mg qDay thereafter. 07/28/22   Elgergawy, Leana Roe, MD  amLODipine (NORVASC) 10 MG tablet Take 10 mg by mouth daily. 09/10/22   [provider]  apixaban (ELIQUIS) 5 MG TABS tablet Take 1 tablet (5 mg total) by mouth 2 (two) times daily. 07/28/22   Elgergawy, Leana Roe, MD  chlorthalidone (HYGROTON) 25 MG tablet Take 25 mg by mouth daily. 08/13/22   [provider]  cyclobenzaprine (FLEXERIL) 10 MG tablet Take 10 mg by mouth daily as needed for muscle spasms.    [provider]  diclofenac Sodium (VOLTAREN) 1 % GEL Apply 2 g topically 4 (four) times daily as needed (pain). 09/20/19   [provider]  DULoxetine (CYMBALTA) 20 MG capsule Take 60 mg by mouth daily. 08/28/19   [provider]  fentaNYL (DURAGESIC) 50 MCG/HR Place 1 patch onto the skin every 3 (three) days. 09/08/22   Horton, Mayer Masker, MD  gabapentin (NEURONTIN) 300 MG capsule Take 300-1,500 mg by mouth daily. Take 300mg  (1 capsule) by mouth in the mornings and 1500mg  (5 capsules) in the evening. 08/03/16   [provider]  JARDIANCE 10 MG TABS tablet Take 10 mg by mouth daily. 08/09/22   [provider]  losartan (COZAAR) 50 MG tablet Take 50 mg by mouth daily. 07/14/16   [provider]  metoprolol succinate (TOPROL-XL) 50 MG 24 hr tablet Take 1 tablet (50 mg total) by mouth 2 (two) times daily. Take with or immediately following a meal. 07/28/22   Elgergawy, Leana Roe, MD  Multiple Vitamins-Minerals (MULTIVITAMIN WITH MINERALS) tablet Take 1 tablet by mouth daily.    [provider]  Semaglutide (RYBELSUS) 7 MG TABS Take by mouth.    [provider]  simvastatin (ZOCOR) 20 MG tablet Take 20 mg by  mouth daily. 07/07/16   [provider]  tamsulosin (FLOMAX) 0.4 MG CAPS capsule Take 0.4 mg by mouth daily. 09/28/22   [provider]      Allergies    Metformin, Oxycodone, Oxycodone-acetaminophen, Lisinopril, Morphine, and Morphine and codeine    Review of Systems   Review of Systems A 10 point review of systems was performed and is negative unless otherwise reported in HPI.  Physical Exam Updated Vital Signs Ht 5\' 10"  (1.778 m)   Wt 113 kg   BMI 35.74 kg/m  Physical Exam General: Normal appearing {Desc; male/male:11659}, lying in bed.  HEENT: PERRLA, Sclera anicteric, MMM, trachea midline.   Cardiology: RRR, no murmurs/rubs/gallops. BL radial and DP pulses equal bilaterally.  Resp: Normal respiratory rate and effort. CTAB, no wheezes, rhonchi, crackles.  Abd: Soft, non-tender, non-distended. No rebound tenderness or guarding.  GU: Deferred. MSK: No peripheral edema or signs of trauma. Extremities without deformity or TTP. No cyanosis or clubbing. Skin: warm, dry. No rashes or lesions. Back: No CVA tenderness Neuro: A&Ox4, CNs II-XII grossly intact. MAEs. Sensation grossly intact.  Psych: Normal mood and affect.   ED Results / Procedures / Treatments   Labs (all labs ordered are listed, but only abnormal results are displayed) Labs Reviewed - No data to display  EKG None  Radiology No results found.  Procedures Procedures  {Document cardiac monitor, telemetry assessment procedure when appropriate:1}  Medications Ordered in ED Medications - No data to display  ED Course/ Medical Decision Making/ A&P                          Medical Decision Making Amount and/or Complexity of Data Reviewed Labs: ordered. Radiology: ordered.    This patient presents to the ED for concern of ***, this involves an extensive number of treatment options, and is a complaint that carries with it a high risk of complications and morbidity.  I considered the following differential and admission for this acute, potentially life threatening condition.   MDM:    ***     Labs: I Ordered, and personally interpreted labs.  The pertinent results include:  ***  Imaging Studies ordered: I ordered imaging studies including *** I independently visualized and interpreted imaging. I agree with the radiologist interpretation  Additional history obtained from ***.  External records from outside source obtained and reviewed including ***  Cardiac Monitoring: .The patient was maintained on a cardiac monitor.  I personally viewed and interpreted the cardiac monitored which showed an  underlying rhythm of: ***  Reevaluation: After the interventions noted above, I reevaluated the patient and found that they have :{resolved/improved/worsened:23923::"improved"}  Social Determinants of Health: .***  Disposition:  ***  Co morbidities that complicate the patient evaluation . Past Medical History:  Diagnosis Date  . Arthritis   . Diabetes mellitus without complication (HCC)   . Hypertension   . Sleep apnea      Medicines No orders of the defined types were placed in this encounter.   I have reviewed the patients home medicines and have made adjustments as needed  Problem List / ED Course: Problem List Items Addressed This Visit   None        {Document critical care time when appropriate:1} {Document review of labs and clinical decision tools ie heart score, Chads2Vasc2 etc:1}  {Document your independent review of radiology images, and any outside records:1} {Document your discussion with family members, caretakers, and with consultants:1} {Document  social determinants of health affecting pt's care:1} {Document your decision making why or why not admission, treatments were needed:1}  This note was created using dictation software, which may contain spelling or grammatical errors.

## 2023-09-12 NOTE — Discharge Instructions (Addendum)
 Thank you for coming to [facility] Emergency Department. You were seen for right foot pain, right leg weakness. 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 Please do not take your chlorthalidone until your right foot gout symptoms improve. Please take a steroid taper to help with your symptoms. While on the prednisone taper, please be mindful of your blood sugars, as it can make them run high.  Please follow up with your primary care provider within 1 week. Please also follow up with your spine doctor about your back pain and MRI results.  -Return to the ED if you develop any of the following: - Fever (100.4 F or 38 C) or chills at home that do not respond to over the counter medications - Weakness, numbness, or tingling in your extremities - Difficulty emptying bladder / urinary incontinence - Fecal incontinence - Uncontrolled nausea/vomiting with inability to keep down liquids - Feeling as though you are going to pass out or passing out -Lightheadedness, passing out -Fevers/chills -Anything else that concerns you

## 2023-09-12 NOTE — ED Notes (Signed)
 Patient transported to MRI

## 2023-10-20 ENCOUNTER — Emergency Department (HOSPITAL_COMMUNITY)

## 2023-10-20 ENCOUNTER — Observation Stay (HOSPITAL_COMMUNITY)
Admission: EM | Admit: 2023-10-20 | Discharge: 2023-10-23 | Disposition: A | Discharge status: Home / Self Care | Attending: Internal Medicine | Admitting: Internal Medicine

## 2023-10-20 ENCOUNTER — Encounter (HOSPITAL_COMMUNITY): Payer: Self-pay | Admitting: Internal Medicine

## 2023-10-20 ENCOUNTER — Other Ambulatory Visit: Payer: Self-pay

## 2023-10-20 DIAGNOSIS — N400 Enlarged prostate without lower urinary tract symptoms: Secondary | ICD-10-CM | POA: Diagnosis not present

## 2023-10-20 DIAGNOSIS — A419 Sepsis, unspecified organism: Secondary | ICD-10-CM | POA: Diagnosis present

## 2023-10-20 DIAGNOSIS — E860 Dehydration: Secondary | ICD-10-CM | POA: Diagnosis not present

## 2023-10-20 DIAGNOSIS — Z7984 Long term (current) use of oral hypoglycemic drugs: Secondary | ICD-10-CM | POA: Diagnosis not present

## 2023-10-20 DIAGNOSIS — N3001 Acute cystitis with hematuria: Principal | ICD-10-CM

## 2023-10-20 DIAGNOSIS — N3000 Acute cystitis without hematuria: Secondary | ICD-10-CM | POA: Diagnosis present

## 2023-10-20 DIAGNOSIS — R2681 Unsteadiness on feet: Secondary | ICD-10-CM | POA: Insufficient documentation

## 2023-10-20 DIAGNOSIS — Z7901 Long term (current) use of anticoagulants: Secondary | ICD-10-CM | POA: Insufficient documentation

## 2023-10-20 DIAGNOSIS — E876 Hypokalemia: Secondary | ICD-10-CM | POA: Diagnosis not present

## 2023-10-20 DIAGNOSIS — Z79899 Other long term (current) drug therapy: Secondary | ICD-10-CM | POA: Diagnosis not present

## 2023-10-20 DIAGNOSIS — E119 Type 2 diabetes mellitus without complications: Secondary | ICD-10-CM | POA: Diagnosis not present

## 2023-10-20 DIAGNOSIS — Z6834 Body mass index (BMI) 34.0-34.9, adult: Secondary | ICD-10-CM | POA: Insufficient documentation

## 2023-10-20 DIAGNOSIS — I480 Paroxysmal atrial fibrillation: Secondary | ICD-10-CM | POA: Diagnosis present

## 2023-10-20 DIAGNOSIS — R296 Repeated falls: Secondary | ICD-10-CM | POA: Diagnosis not present

## 2023-10-20 DIAGNOSIS — E1142 Type 2 diabetes mellitus with diabetic polyneuropathy: Secondary | ICD-10-CM

## 2023-10-20 DIAGNOSIS — E1165 Type 2 diabetes mellitus with hyperglycemia: Secondary | ICD-10-CM

## 2023-10-20 DIAGNOSIS — R531 Weakness: Principal | ICD-10-CM

## 2023-10-20 DIAGNOSIS — M6281 Muscle weakness (generalized): Secondary | ICD-10-CM | POA: Insufficient documentation

## 2023-10-20 DIAGNOSIS — Z7985 Long-term (current) use of injectable non-insulin antidiabetic drugs: Secondary | ICD-10-CM | POA: Diagnosis not present

## 2023-10-20 DIAGNOSIS — E669 Obesity, unspecified: Secondary | ICD-10-CM | POA: Diagnosis not present

## 2023-10-20 DIAGNOSIS — I10 Essential (primary) hypertension: Secondary | ICD-10-CM | POA: Diagnosis present

## 2023-10-20 DIAGNOSIS — W19XXXA Unspecified fall, initial encounter: Secondary | ICD-10-CM | POA: Diagnosis not present

## 2023-10-20 HISTORY — DX: Paroxysmal atrial fibrillation: I48.0

## 2023-10-20 LAB — COMPREHENSIVE METABOLIC PANEL WITH GFR
ALT: 18 U/L (ref 0–44)
AST: 14 U/L — ABNORMAL LOW (ref 15–41)
Albumin: 4 g/dL (ref 3.5–5.0)
Alkaline Phosphatase: 53 U/L (ref 38–126)
Anion gap: 15 (ref 5–15)
BUN: 13 mg/dL (ref 8–23)
CO2: 25 mmol/L (ref 22–32)
Calcium: 9.5 mg/dL (ref 8.9–10.3)
Chloride: 95 mmol/L — ABNORMAL LOW (ref 98–111)
Creatinine, Ser: 0.75 mg/dL (ref 0.61–1.24)
GFR, Estimated: >60 mL/min (ref >60)
Glucose, Bld: 225 mg/dL — ABNORMAL HIGH (ref 70–99)
Potassium: 3.2 mmol/L — ABNORMAL LOW (ref 3.5–5.1)
Sodium: 135 mmol/L (ref 135–145)
Total Bilirubin: 1.9 mg/dL — ABNORMAL HIGH (ref 0.0–1.2)
Total Protein: 7.2 g/dL (ref 6.5–8.1)

## 2023-10-20 LAB — CBC WITH DIFFERENTIAL/PLATELET
Abs Immature Granulocytes: 0.15 10*3/uL — ABNORMAL HIGH (ref 0.00–0.07)
Basophils Absolute: 0.1 10*3/uL (ref 0.0–0.1)
Basophils Relative: 0 %
Eosinophils Absolute: 0 10*3/uL (ref 0.0–0.5)
Eosinophils Relative: 0 %
HCT: 46.6 % (ref 39.0–52.0)
Hemoglobin: 15.2 g/dL (ref 13.0–17.0)
Immature Granulocytes: 1 %
Lymphocytes Relative: 7 %
Lymphs Abs: 1.1 10*3/uL (ref 0.7–4.0)
MCH: 28.1 pg (ref 26.0–34.0)
MCHC: 32.6 g/dL (ref 30.0–36.0)
MCV: 86.1 fL (ref 80.0–100.0)
Monocytes Absolute: 1.3 10*3/uL — ABNORMAL HIGH (ref 0.1–1.0)
Monocytes Relative: 8 %
Neutro Abs: 13.1 10*3/uL — ABNORMAL HIGH (ref 1.7–7.7)
Neutrophils Relative %: 84 %
Platelets: 222 10*3/uL (ref 150–400)
RBC: 5.41 MIL/uL (ref 4.22–5.81)
RDW: 14.3 % (ref 11.5–15.5)
WBC: 15.8 10*3/uL — ABNORMAL HIGH (ref 4.0–10.5)
nRBC: 0 % (ref 0.0–0.2)

## 2023-10-20 LAB — URINALYSIS, ROUTINE W REFLEX MICROSCOPIC
Bacteria, UA: NONE SEEN
Bilirubin Urine: NEGATIVE
Glucose, UA: >=500 mg/dL — AB
Ketones, ur: 20 mg/dL — AB
Nitrite: NEGATIVE
Protein, ur: NEGATIVE mg/dL
Specific Gravity, Urine: 1.029 (ref 1.005–1.030)
WBC, UA: >50 WBC/hpf (ref 0–5)
pH: 7 (ref 5.0–8.0)

## 2023-10-20 LAB — CK: Total CK: 42 U/L — ABNORMAL LOW (ref 49–397)

## 2023-10-20 LAB — MAGNESIUM: Magnesium: 2.1 mg/dL (ref 1.7–2.4)

## 2023-10-20 LAB — CG4 I-STAT (LACTIC ACID): Lactic Acid, Venous: 1.3 mmol/L (ref 0.5–1.9)

## 2023-10-20 MED ORDER — TAMSULOSIN HCL 0.4 MG PO CAPS
0.4000 mg | ORAL_CAPSULE | Freq: Every day | ORAL | Status: DC
  Administered 2023-10-21 – 2023-10-23 (×3): 0.4 mg via ORAL
  Filled 2023-10-20 (×3): qty 1

## 2023-10-20 MED ORDER — GABAPENTIN 300 MG PO CAPS
1500.0000 mg | ORAL_CAPSULE | Freq: Every day | ORAL | Status: DC
  Administered 2023-10-20 – 2023-10-22 (×3): 1500 mg via ORAL
  Filled 2023-10-20 (×3): qty 5

## 2023-10-20 MED ORDER — ACETAMINOPHEN 500 MG PO TABS
1000.0000 mg | ORAL_TABLET | Freq: Four times a day (QID) | ORAL | Status: DC | PRN
Start: 2023-10-20 — End: 2023-10-23
  Administered 2023-10-20 – 2023-10-23 (×4): 1000 mg via ORAL
  Filled 2023-10-20 (×4): qty 2

## 2023-10-20 MED ORDER — ACETAMINOPHEN 325 MG PO TABS: 650.0000 mg | ORAL_TABLET | Freq: Four times a day (QID) | ORAL | Status: DC | PRN

## 2023-10-20 MED ORDER — SODIUM CHLORIDE 0.9 % IV SOLN
1.0000 g | INTRAVENOUS | Status: DC
  Administered 2023-10-21 – 2023-10-22 (×2): 1 g via INTRAVENOUS
  Filled 2023-10-20 (×2): qty 10

## 2023-10-20 MED ORDER — METOPROLOL SUCCINATE ER 25 MG PO TB24
25.0000 mg | ORAL_TABLET | Freq: Every day | ORAL | Status: DC
  Administered 2023-10-21 – 2023-10-23 (×3): 25 mg via ORAL
  Filled 2023-10-20 (×3): qty 1

## 2023-10-20 MED ORDER — POTASSIUM CHLORIDE CRYS ER 20 MEQ PO TBCR
40.0000 meq | EXTENDED_RELEASE_TABLET | Freq: Once | ORAL | Status: AC
  Administered 2023-10-20: 40 meq via ORAL
  Filled 2023-10-20: qty 2

## 2023-10-20 MED ORDER — ACETAMINOPHEN 650 MG RE SUPP: 650.0000 mg | Freq: Four times a day (QID) | RECTAL | Status: DC | PRN

## 2023-10-20 MED ORDER — GABAPENTIN 300 MG PO CAPS
300.0000 mg | ORAL_CAPSULE | Freq: Every day | ORAL | Status: DC
  Administered 2023-10-21 – 2023-10-23 (×3): 300 mg via ORAL
  Filled 2023-10-20 (×3): qty 1

## 2023-10-20 MED ORDER — ATORVASTATIN CALCIUM 10 MG PO TABS
10.0000 mg | ORAL_TABLET | Freq: Every day | ORAL | Status: DC
  Administered 2023-10-21 – 2023-10-23 (×3): 10 mg via ORAL
  Filled 2023-10-20 (×3): qty 1

## 2023-10-20 MED ORDER — LACTATED RINGERS IV SOLN: INTRAVENOUS | Status: DC

## 2023-10-20 MED ORDER — ONDANSETRON HCL 4 MG/2ML IJ SOLN: 4.0000 mg | Freq: Four times a day (QID) | INTRAMUSCULAR | Status: DC | PRN

## 2023-10-20 MED ORDER — DRONEDARONE HCL 400 MG PO TABS
400.0000 mg | ORAL_TABLET | Freq: Two times a day (BID) | ORAL | Status: DC
  Administered 2023-10-20 – 2023-10-23 (×6): 400 mg via ORAL
  Filled 2023-10-20 (×7): qty 1

## 2023-10-20 MED ORDER — METHOCARBAMOL 500 MG PO TABS: 500.0000 mg | ORAL_TABLET | ORAL | Status: DC | PRN

## 2023-10-20 MED ORDER — SODIUM CHLORIDE 0.9 % IV SOLN
1.0000 g | Freq: Once | INTRAVENOUS | Status: AC
  Administered 2023-10-20: 1 g via INTRAVENOUS
  Filled 2023-10-20: qty 10

## 2023-10-20 MED ORDER — METHOCARBAMOL 500 MG PO TABS
500.0000 mg | ORAL_TABLET | Freq: Once | ORAL | Status: AC
  Administered 2023-10-20: 500 mg via ORAL
  Filled 2023-10-20: qty 1

## 2023-10-20 MED ORDER — MELATONIN 3 MG PO TABS
3.0000 mg | ORAL_TABLET | Freq: Every evening | ORAL | Status: DC | PRN
  Filled 2023-10-20: qty 1

## 2023-10-20 MED ORDER — INSULIN ASPART 100 UNIT/ML IJ SOLN: 0.0000 [IU] | Freq: Three times a day (TID) | INTRAMUSCULAR | Status: DC

## 2023-10-20 MED ORDER — DULOXETINE HCL 30 MG PO CPEP
60.0000 mg | ORAL_CAPSULE | Freq: Every day | ORAL | Status: DC
  Administered 2023-10-21 – 2023-10-23 (×3): 60 mg via ORAL
  Filled 2023-10-20 (×3): qty 2

## 2023-10-20 MED ORDER — IBUPROFEN 200 MG PO TABS
600.0000 mg | ORAL_TABLET | Freq: Once | ORAL | Status: AC
  Administered 2023-10-20: 600 mg via ORAL
  Filled 2023-10-20: qty 3

## 2023-10-20 NOTE — ED Provider Notes (Signed)
 [place] EMERGENCY DEPARTMENT AT [facility] Provider Note   CSN: [id number] Arrival date & time: 10/20/23  1542     History  Chief Complaint  Patient presents with   Adarsh Veldman is a 68 y.o. male.  Patient to ED via EMS from [facility] where he lives in independent living. He reports 2 falls yesterday from generalized weakness, requiring EMS assistance to get up. He is here today because he couldn't get out of bed this morning due to soreness and weakness. No fever, chest pain, SOB, cough, congestion. He denies head injury, new neck pain (has chronic neck pain), chest or abdominal injury. He is not anticoagulated. He complains of bruising to the left elbow and pain in the right ankle and foot from falls yesterday. He reports bilateral hip pain but was able to ambulate with assistance after EMS helped him up.   The history is provided by the patient. No language interpreter was used.  Fall       Home Medications Prior to Admission medications   Medication Sig Start Date End Date Taking? Authorizing Provider  acetaminophen  (TYLENOL ) 325 MG tablet Take 1 tablet (325 mg total) by mouth every 6 (six) hours as needed for mild pain (or Fever >/= 101). 07/28/22   Elgergawy, Ardia Kraft, MD  amiodarone  (PACERONE ) 200 MG tablet Amio 400 mg BID for 5 days. Then decrease to  200 mg BID for 1 week , then 200 mg qDay thereafter. 07/28/22   Elgergawy, Ardia Kraft, MD  amLODipine (NORVASC) 10 MG tablet Take 10 mg by mouth daily. 09/10/22   [provider]  apixaban  (ELIQUIS ) 5 MG TABS tablet Take 1 tablet (5 mg total) by mouth 2 (two) times daily. 07/28/22   Elgergawy, Ardia Kraft, MD  chlorthalidone (HYGROTON) 25 MG tablet Take 25 mg by mouth daily. 08/13/22   [provider]  cyclobenzaprine (FLEXERIL) 10 MG tablet Take 10 mg by mouth daily as needed for muscle spasms.    [provider]  diclofenac Sodium (VOLTAREN) 1 % GEL Apply 2 g topically 4 (four)  times daily as needed (pain). 09/20/19   [provider]  DULoxetine  (CYMBALTA ) 20 MG capsule Take 60 mg by mouth daily. 08/28/19   [provider]  fentaNYL  (DURAGESIC ) 50 MCG/HR Place 1 patch onto the skin every 3 (three) days. 09/08/22   Horton, Vonzella Guernsey, MD  gabapentin  (NEURONTIN ) 300 MG capsule Take 300-1,500 mg by mouth daily. Take 300mg  (1 capsule) by mouth in the mornings and 1500mg  (5 capsules) in the evening. 08/03/16   [provider]  JARDIANCE 10 MG TABS tablet Take 10 mg by mouth daily. 08/09/22   [provider]  losartan (COZAAR) 50 MG tablet Take 50 mg by mouth daily. 07/14/16   [provider]  metoprolol  succinate (TOPROL -XL) 50 MG 24 hr tablet Take 1 tablet (50 mg total) by mouth 2 (two) times daily. Take with or immediately following a meal. 07/28/22   Elgergawy, Ardia Kraft, MD  Multiple Vitamins-Minerals (MULTIVITAMIN WITH MINERALS) tablet Take 1 tablet by mouth daily.    [provider]  Semaglutide (RYBELSUS) 7 MG TABS Take by mouth.    [provider]  simvastatin  (ZOCOR ) 20 MG tablet Take 20 mg by mouth daily. 07/07/16   [provider]  tamsulosin (FLOMAX) 0.4 MG CAPS capsule Take 0.4 mg by mouth daily. 09/28/22   [provider]      Allergies  Metformin, Oxycodone, Oxycodone-acetaminophen , Lisinopril, Morphine, and Morphine and codeine    Review of Systems   Review of Systems  Physical Exam Updated Vital Signs BP (!) 140/66 (BP Location: Left Arm)   Pulse 99   Temp 99.1 F (37.3 C) (Oral)   Resp 16   SpO2 96%  Physical Exam Vitals and nursing note reviewed.  Constitutional:      Appearance: Normal appearance. He is obese.  HENT:     Head: Atraumatic.  Eyes:     Pupils: Pupils are equal, round, and reactive to light.  Neck:     Comments: No swelling, or discoloration about the cervical spine.  Cardiovascular:     Rate and Rhythm: Normal rate and regular rhythm.     Heart sounds:  No murmur heard. Pulmonary:     Effort: Pulmonary effort is normal.     Breath sounds: Normal breath sounds. No wheezing, rhonchi or rales.  Abdominal:     General: There is no distension.     Palpations: Abdomen is soft.     Tenderness: There is no abdominal tenderness.  Musculoskeletal:     Cervical back: Normal range of motion and neck supple.     Comments: Swelling and bruising to left elbow without bony deformity. Maintains FROM without limitation. Right ankle and foot moderately swollen, mottled in appearance. No specific bony deformities. Generalized tenderness of foot and ankle.   Skin:    General: Skin is warm and dry.     Comments: Right foot is slightly mottled.   Neurological:     Mental Status: He is alert and oriented to person, place, and time.     ED Results / Procedures / Treatments   Labs (all labs ordered are listed, but only abnormal results are displayed) Labs Reviewed  CBC WITH DIFFERENTIAL/PLATELET - Abnormal; Notable for the following components:      Result Value   WBC 15.8 (*)    Neutro Abs 13.1 (*)    Monocytes Absolute 1.3 (*)    Abs Immature Granulocytes 0.15 (*)    All other components within normal limits  COMPREHENSIVE METABOLIC PANEL WITH GFR - Abnormal; Notable for the following components:   Potassium 3.2 (*)    Chloride 95 (*)    Glucose, Bld 225 (*)    AST 14 (*)    Total Bilirubin 1.9 (*)    All other components within normal limits  URINALYSIS, ROUTINE W REFLEX MICROSCOPIC - Abnormal; Notable for the following components:   APPearance HAZY (*)    Glucose, UA >=500 (*)    Hgb urine dipstick SMALL (*)    Ketones, ur 20 (*)    Leukocytes,Ua MODERATE (*)    All other components within normal limits  URINE CULTURE    EKG None  Radiology DG Hip Unilat W or Wo Pelvis 2-3 Views Right Result Date: 10/20/2023 CLINICAL DATA:  Fall, pain EXAM: DG HIP (WITH OR WITHOUT PELVIS) 2-3V RIGHT COMPARISON:  None Available. FINDINGS: Hip joints and  SI joints symmetric. No acute bony abnormality. Specifically, no fracture, subluxation, or dislocation. IMPRESSION: No acute bony abnormality. Electronically Signed   By: Janeece Mechanic M.D.   On: 10/20/2023 21:06   DG Lumbar Spine Complete Result Date: 10/20/2023 CLINICAL DATA:  Fall, pain EXAM: LUMBAR SPINE - COMPLETE 4+ VIEW COMPARISON:  06/26/2023 FINDINGS: Normal alignment. No fracture. Diffuse degenerative disc and facet disease. SI joints symmetric and unremarkable. InterStim device un changed. Diffuse aortic atherosclerosis. No aneurysm. IMPRESSION: No acute  bony abnormality. Electronically Signed   By: Janeece Mechanic M.D.   On: 10/20/2023 21:05   DG Ankle Complete Right Result Date: 10/20/2023 CLINICAL DATA:  Fall, decreased mobility EXAM: RIGHT ANKLE - COMPLETE 3+ VIEW; RIGHT FOOT COMPLETE - 3+ VIEW COMPARISON:  09/12/2023 FINDINGS: Small osseous fragments adjacent to the posteromedial process of the talus are favored chronic but could represent a small avulsion fracture. Recommend correlation with site of pain. No dislocation. Degenerative change about the midfoot and first MTP joint. Plantar calcaneal spur. IMPRESSION: Small osseous fragments adjacent to the posteromedial process of the talus are favored chronic but could represent a small avulsion fracture. Recommend correlation with site of pain. Electronically Signed   By: Rozell Cornet M.D.   On: 10/20/2023 19:16   DG Foot Complete Right Result Date: 10/20/2023 CLINICAL DATA:  Fall, decreased mobility EXAM: RIGHT ANKLE - COMPLETE 3+ VIEW; RIGHT FOOT COMPLETE - 3+ VIEW COMPARISON:  09/12/2023 FINDINGS: Small osseous fragments adjacent to the posteromedial process of the talus are favored chronic but could represent a small avulsion fracture. Recommend correlation with site of pain. No dislocation. Degenerative change about the midfoot and first MTP joint. Plantar calcaneal spur. IMPRESSION: Small osseous fragments adjacent to the posteromedial  process of the talus are favored chronic but could represent a small avulsion fracture. Recommend correlation with site of pain. Electronically Signed   By: Rozell Cornet M.D.   On: 10/20/2023 19:16    Procedures Procedures    Medications Ordered in ED Medications  cefTRIAXone  (ROCEPHIN ) 1 g in sodium chloride  0.9 % 100 mL IVPB (has no administration in time range)  ibuprofen  (ADVIL ) tablet 600 mg (600 mg Oral Given 10/20/23 2015)  methocarbamol  (ROBAXIN ) tablet 500 mg (500 mg Oral Given 10/20/23 2015)    ED Course/ Medical Decision Making/ A&P Clinical Course as of 10/20/23 2200  Fri Oct 20, 2023  1839 Patient to ED by EMS called because he couldn't get out of bed this morning. He reports pain/soreness from 2 falls yesterday. Reports now that the ankle has been sore for 3 weeks.   Labs reassuring, however, WBC 15.8, normal hgb; K+ 3.2; UA without bacteria but WBC >50. Culture added.  [SU]  2011 Recheck - I attempted to get the patient out of bed but he was unable. Reports increased pain in theright back and hip. Will image these areas. He is living in independent living and is felt too weak to be safe at home. PT unavailable tonight for evaluation. Will provide ibuprofen , Robaxin  (he does not want opioids), review additional imaging and reassess the patient.  [SU]  2135 Patient was given ibuprofen  and muscle relaxer and again attempted to get his to a sitting position, which he could not do. I spoke with Hoy Mackintosh, at [facility] who confirmed he lives in independent living and there is no temporary short stay assisted or skilled nursing placement available. PT unavailable tonight for assessment. In his current condition, he is unable to be discharged back to independent living and will need admission for PT, strengthening, consideration of change of living condition to assisted/skilled nursing.  [SU]  2142 Labs concerning for leukocytosis of 15.8, UA concerning for infection with >50 WBCs,  moderate leukocytes, ketones. 1 gram Rocephin  started. Pending admission.  [SU]  2159 Discussed with Dr. Brock Canner who accepts for admission.  [SU]    Clinical Course User Index [SU] Mandy Second, PA-C  Medical Decision Making Amount and/or Complexity of Data Reviewed Labs: ordered. Radiology: ordered.  Risk OTC drugs. Prescription drug management.           Final Clinical Impression(s) / ED Diagnoses Final diagnoses:  Acute cystitis with hematuria  Generalized weakness    Rx / DC Orders ED Discharge Orders     None         Rama Burkitt 10/20/23 2200    Wynetta Heckle, MD 10/20/23 2351

## 2023-10-20 NOTE — ED Triage Notes (Signed)
 BIBA from home for decreased mobility- pt had 2 falls yesterday, no LOC, not on thinners 144/70 BP 70 HR 97% R/A 254 cbg

## 2023-10-20 NOTE — H&P (Signed)
 History and Physical      Alan Lin:[id number] DOB: 1956-01-14 DOA: 10/20/2023; DOS: 10/20/2023  PCP: Peri Brackett, MD  Patient coming from: Independent living at [facility]  I have personally briefly reviewed patient's old medical records in [facility]  Chief Complaint: Generalized weakness  HPI: Alan Lin is a 68 y.o. male with medical history significant for type 2 diabetes mellitus, essential hypertension, paroxysmal atrial fibrillation not on chronic anticoagulation, who is admitted to [facility] on 10/20/2023 with generalized weakness after presenting from home to [facility] ED complaining of generalized weakness.   The patient reports 3 to 4 days of generalized weakness in the absence of focal weakness.  He reports that, as a consequence of the generalized weakness, he has experienced 2-3 level falls over the last 2 days in which she tripped while attempting to ambulate at his independent living facility at [facility].  He reports that these falls have not been associated with any loss of consciousness, and he confirms that he has not hit his head as a result of these falls.  He reports that he was diagnosed with a right ankle sprain 3 weeks ago, at which time he was evaluated in the ED on 09/09/2023.  He underwent venous ultrasound of the right lower extremity at that time which was negative for DVT.  He reports that his ankle has some residual tenderness, swelling, but emphasizes that these are not new findings as a consequence of the ground-level mechanical falls over the last 2 days.  Otherwise, he denies any associated acute arthralgias or myalgias.  No headache or any acute neck discomfort  Over the last 2 days, he conveys progressive difficulty with ambulating as a consequence of the generalized weakness.  After his most recent, will" fall, he conveys that he was unable to extricate himself off the floor due to this generalized weakness.  He was able to  contact EMS who were able to assist him off of the floor, noting very limited duration of time on the floor prior to EMSs arrival.  Denies any associated subjective fever, chills, rigors, or generalized myalgias.  No recent dysuria or gross hematuria.  He denies any recent cough, shortness of breath, abdominal discomfort, diarrhea.  He also denies any recent chest pain, palpitations, diaphoresis, nausea, vomiting, dizziness, or presyncope.  His medical history is notable for paroxysmal atrial fibrillation, with most recent echocardiogram in February 2024, which showed LVEF 66 5%, normal left ventricular diastolic parameters, normal right ventricular systolic function and no evidence of significant valvular pathology.  In the setting of a history of paroxysmal atrial fibrillation, it appears that he was previously prescribed Eliquis , but the patient conveys he has never been on Eliquis , and is not currently on any blood thinners, including no formal anticoagulation.  Additionally, not on any aspirin at home.     ED Course:  Vital signs in the ED were notable for the following: Temperature max 99.1; heart rate in the 90s to low 100; systolic blood pressures in the 120s 140s; respiratory rate 16-19, oxygen  saturation 96-98 % on room air.  Labs were notable for the following: CMP was notable for the following: Sodium 135, potassium 3.2, creatinine 0.75, glucose 225.  Total bilirubin 1.9, otherwise, liver enzymes were within normal limits.  CBC notable for white cell count 18,800 with 84% neutrophils, hemoglobin 15.2.  Urinalysis assessed with hazy appearing specimen and notable for greater than 50 white blood cells, moderate leukocyte esterase,  no evidence discos epithelial cells, will demonstrate 20 ketones and specific gravity 1.029.  Urine culture collected in the ED prior to initiation of Rocephin .  Per my interpretation, EKG in ED demonstrated the following: No EKG performed in the ED  today.  Imaging in the ED, per corresponding formal radiology read, was notable for the following: Plain films of the right foot and right ankle showed small osseous fragments adjacent to the posterior medial process of the talus, which, per radiology report, appears to be chronic in nature.  Plain films of the lumbar spine showed no evidence of acute bony abnormality.  Plain films of the right hip/pelvis showed no evidence of acute bony abnormality.  While in the ED, the following were administered: Ibuprofen  600 mg p.o. x 1 dose, Robaxin  x 1 dose.  Rocephin  has been ordered for suspected urinary tract infection, although the patient is not yet received his first dose thereof.  Additionally, the setting of his reported subacute right ankle sprain, EP is applied ASO ankle brace to the right ankle.  Subsequently, the patient was admitted for further evaluation management of presenting generalized weakness resulting in multiple ground-level mechanical falls over the last 2 days, in the setting of sepsis due to suspected acute cystitis, with presentation also associated with clinical evidence of dehydration and presenting labs also notable for hypokalemia.    Review of Systems: As per HPI otherwise 10 point review of systems negative.   Past Medical History:  Diagnosis Date   Arthritis    Diabetes mellitus without complication (HCC)    Hypertension    Paroxysmal atrial fibrillation (HCC)    Sleep apnea     No past surgical history on file.  Social History:  reports that he has never smoked. He has never used smokeless tobacco. He reports that he does not drink alcohol and does not use drugs.   Allergies  Allergen Reactions   Metformin Diarrhea and Nausea And Vomiting   Oxycodone Other (See Comments)    Constipation   Oxycodone-Acetaminophen      Constipation   Lisinopril Cough   Morphine Itching    Other reaction(s): Other (See Comments)  Constipation   Morphine And Codeine Itching     Family History  Problem Relation Age of Onset   Dementia Mother    Alzheimer's disease Mother    Kidney Stones Father     Family history reviewed and not pertinent    Prior to Admission medications   Medication Sig Start Date End Date Taking? Authorizing Provider  acetaminophen  (TYLENOL ) 325 MG tablet Take 1 tablet (325 mg total) by mouth every 6 (six) hours as needed for mild pain (or Fever >/= 101). 07/28/22   Elgergawy, Ardia Kraft, MD  amiodarone  (PACERONE ) 200 MG tablet Amio 400 mg BID for 5 days. Then decrease to  200 mg BID for 1 week , then 200 mg qDay thereafter. 07/28/22   Elgergawy, Ardia Kraft, MD  amLODipine (NORVASC) 10 MG tablet Take 10 mg by mouth daily. 09/10/22   [provider]  apixaban  (ELIQUIS ) 5 MG TABS tablet Take 1 tablet (5 mg total) by mouth 2 (two) times daily. 07/28/22   Elgergawy, Ardia Kraft, MD  chlorthalidone (HYGROTON) 25 MG tablet Take 25 mg by mouth daily. 08/13/22   [provider]  cyclobenzaprine (FLEXERIL) 10 MG tablet Take 10 mg by mouth daily as needed for muscle spasms.    [provider]  diclofenac Sodium (VOLTAREN) 1 % GEL Apply 2 g topically 4 (  four) times daily as needed (pain). 09/20/19   [provider]  DULoxetine  (CYMBALTA ) 20 MG capsule Take 60 mg by mouth daily. 08/28/19   [provider]  fentaNYL  (DURAGESIC ) 50 MCG/HR Place 1 patch onto the skin every 3 (three) days. 09/08/22   Horton, Vonzella Guernsey, MD  gabapentin  (NEURONTIN ) 300 MG capsule Take 300-1,500 mg by mouth daily. Take 300mg  (1 capsule) by mouth in the mornings and 1500mg  (5 capsules) in the evening. 08/03/16   [provider]  JARDIANCE 10 MG TABS tablet Take 10 mg by mouth daily. 08/09/22   [provider]  losartan (COZAAR) 50 MG tablet Take 50 mg by mouth daily. 07/14/16   [provider]  metoprolol  succinate (TOPROL -XL) 50 MG 24 hr tablet Take 1 tablet (50 mg total) by mouth 2 (two) times daily. Take with or immediately  following a meal. 07/28/22   Elgergawy, Ardia Kraft, MD  Multiple Vitamins-Minerals (MULTIVITAMIN WITH MINERALS) tablet Take 1 tablet by mouth daily.    [provider]  Semaglutide (RYBELSUS) 7 MG TABS Take by mouth.    [provider]  simvastatin  (ZOCOR ) 20 MG tablet Take 20 mg by mouth daily. 07/07/16   [provider]  tamsulosin (FLOMAX) 0.4 MG CAPS capsule Take 0.4 mg by mouth daily. 09/28/22   [provider]     Objective    Physical Exam: Vitals:   10/20/23 1553 10/20/23 1901  BP: 125/73 (!) 140/66  Pulse: (!) 101 99  Resp: 19 16  Temp: 98.1 F (36.7 C) 99.1 F (37.3 C)  TempSrc: Oral Oral  SpO2: 98% 96%    General: appears to be stated age; alert, oriented Skin: warm, dry Head:  AT/ [place] Mouth:  Oral mucosa membranes appear dry, normal dentition Neck: supple; trachea midline Heart:  RRR; did not appreciate any M/R/G Lungs: CTAB, did not appreciate any wheezes, rales, or rhonchi Abdomen: + BS; soft, ND, NT Vascular: 2+ pedal pulses b/l; 2+ radial pulses b/l Extremities: no muscle wasting, ASO brace to right ankle Neuro: strength and sensation intact in upper and lower extremities b/l    Labs on Admission: I have personally reviewed following labs and imaging studies  CBC: Recent Labs  Lab 10/20/23 1809  WBC 15.8*  NEUTROABS 13.1*  HGB 15.2  HCT 46.6  MCV 86.1  PLT 222   Basic Metabolic Panel: Recent Labs  Lab 10/20/23 1809  NA 135  K 3.2*  CL 95*  CO2 25  GLUCOSE 225*  BUN 13  CREATININE 0.75  CALCIUM 9.5   GFR: CrCl cannot be calculated (Unknown ideal weight.). Liver Function Tests: Recent Labs  Lab 10/20/23 1809  AST 14*  ALT 18  ALKPHOS 53  BILITOT 1.9*  PROT 7.2  ALBUMIN 4.0   No results for input(s): "LIPASE", "AMYLASE" in the last 168 hours. No results for input(s): "AMMONIA" in the last 168 hours. Coagulation Profile: No results for input(s): "INR", "PROTIME" in the last 168 hours. Cardiac  Enzymes: No results for input(s): "CKTOTAL", "CKMB", "CKMBINDEX", "TROPONINI" in the last 168 hours. BNP (last 3 results) No results for input(s): "PROBNP" in the last 8760 hours. HbA1C: No results for input(s): "HGBA1C" in the last 72 hours. CBG: No results for input(s): "GLUCAP" in the last 168 hours. Lipid Profile: No results for input(s): "CHOL", "HDL", "LDLCALC", "TRIG", "CHOLHDL", "LDLDIRECT" in the last 72 hours. Thyroid Function Tests: No results for input(s): "TSH", "T4TOTAL", "FREET4", "T3FREE", "THYROIDAB" in the last 72 hours. Anemia Panel: No results  for input(s): "VITAMINB12", "FOLATE", "FERRITIN", "TIBC", "IRON", "RETICCTPCT" in the last 72 hours. Urine analysis:    Component Value Date/Time   COLORURINE YELLOW 10/20/2023 1809   APPEARANCEUR HAZY (A) 10/20/2023 1809   LABSPEC 1.029 10/20/2023 1809   PHURINE 7.0 10/20/2023 1809   GLUCOSEU >=500 (A) 10/20/2023 1809   HGBUR SMALL (A) 10/20/2023 1809   BILIRUBINUR NEGATIVE 10/20/2023 1809   KETONESUR 20 (A) 10/20/2023 1809   PROTEINUR NEGATIVE 10/20/2023 1809   NITRITE NEGATIVE 10/20/2023 1809   LEUKOCYTESUR MODERATE (A) 10/20/2023 1809    Radiological Exams on Admission: DG Hip Unilat W or Wo Pelvis 2-3 Views Right Result Date: 10/20/2023 CLINICAL DATA:  Fall, pain EXAM: DG HIP (WITH OR WITHOUT PELVIS) 2-3V RIGHT COMPARISON:  None Available. FINDINGS: Hip joints and SI joints symmetric. No acute bony abnormality. Specifically, no fracture, subluxation, or dislocation. IMPRESSION: No acute bony abnormality. Electronically Signed   By: Janeece Mechanic M.D.   On: 10/20/2023 21:06   DG Lumbar Spine Complete Result Date: 10/20/2023 CLINICAL DATA:  Fall, pain EXAM: LUMBAR SPINE - COMPLETE 4+ VIEW COMPARISON:  06/26/2023 FINDINGS: Normal alignment. No fracture. Diffuse degenerative disc and facet disease. SI joints symmetric and unremarkable. InterStim device un changed. Diffuse aortic atherosclerosis. No aneurysm. IMPRESSION: No  acute bony abnormality. Electronically Signed   By: Janeece Mechanic M.D.   On: 10/20/2023 21:05   DG Ankle Complete Right Result Date: 10/20/2023 CLINICAL DATA:  Fall, decreased mobility EXAM: RIGHT ANKLE - COMPLETE 3+ VIEW; RIGHT FOOT COMPLETE - 3+ VIEW COMPARISON:  09/12/2023 FINDINGS: Small osseous fragments adjacent to the posteromedial process of the talus are favored chronic but could represent a small avulsion fracture. Recommend correlation with site of pain. No dislocation. Degenerative change about the midfoot and first MTP joint. Plantar calcaneal spur. IMPRESSION: Small osseous fragments adjacent to the posteromedial process of the talus are favored chronic but could represent a small avulsion fracture. Recommend correlation with site of pain. Electronically Signed   By: Rozell Cornet M.D.   On: 10/20/2023 19:16   DG Foot Complete Right Result Date: 10/20/2023 CLINICAL DATA:  Fall, decreased mobility EXAM: RIGHT ANKLE - COMPLETE 3+ VIEW; RIGHT FOOT COMPLETE - 3+ VIEW COMPARISON:  09/12/2023 FINDINGS: Small osseous fragments adjacent to the posteromedial process of the talus are favored chronic but could represent a small avulsion fracture. Recommend correlation with site of pain. No dislocation. Degenerative change about the midfoot and first MTP joint. Plantar calcaneal spur. IMPRESSION: Small osseous fragments adjacent to the posteromedial process of the talus are favored chronic but could represent a small avulsion fracture. Recommend correlation with site of pain. Electronically Signed   By: Rozell Cornet M.D.   On: 10/20/2023 19:16      Assessment/Plan    Principal Problem:   Generalized weakness Active Problems:   DM2 (diabetes mellitus, type 2) (HCC)   Hypokalemia   Essential hypertension   Falls, initial encounter   Acute cystitis   Sepsis (HCC)   Dehydration   Paroxysmal atrial fibrillation (HCC)   BPH (benign prostatic hyperplasia)    #) Generalized weakness:  3-4  day  duration of generalized weakness, in the absence of any evidence of acute focal neurologic deficits, including no evidence of acute focal weakness to suggest acute CVA.  Suspect contribution from physiologic stress stemming from presenting sepsis due to urinary tract infection, as further detailed below.  No e/o additional infectious process at this time, including no report of any acute respiratory symptoms to increase  index of suspicion for underlying pneumonia or acute respiratory infxn.  There may also be a contribution from mild dehydration, as further outlined below.  In the setting of recent, mechanical falls in which EMS was required to assist the patient off of the floor, will also check CPK level to evaluate for any associated contribution towards his presenting generalized weakness.  As a consequence of his generalized weakness, he is having difficulty ambulating, noting that he is from independent living facility.   Will further eval for any additional contributions from endocrine/metabolic sources, as detailed below.   Plan: work-up and management of presenting sepsis due to UTI, as described below. PT/OT consults ordered for the AM. Fall precautions. CMP/CBC in the AM. Check TSH, serum Mg level. Check B12 level.  Lactated Ringer 's at 40 cc/h x 12 hours, as below.  Add on CPK level.                  #) Ground-level mechanical falls: 2-3 ground-level mechanical falls over the last 2 days, which appear to be as a consequence of the patient's reported generalized weakness, additional predisposing factor in the setting of suspected acute cystitis complicated by sepsis, as further detailed below.  Not associated with a loss of consciousness or any hitting of the head.  Not on a blood thinners as an outpatient, with the patient reporting that he is not a coagulation for his history of paroxysmal atrial fibrillation.  He has a reported history of recent diagnosis of right ankle  sprain with plain films of the right foot and ankle suggestive of chronic appearing small osseous fragments adjacent to the posterior open medial process of the talus, as further detailed above.  Otherwise, no evidence of acute fracture per presentation and today's associated imaging.  Additional factors that may be contributing to his recent ground-level mechanical falls included recent reported diagnosis of right ankle sprain, for which he is receiving stabilizing ASO right ankle brace this evening.  Plan: Further evaluation and management of contributory generalized weakness as well as acute cystitis.  Fall precautions ordered.  PT/OT consults ordered for the morning.                  #) Hypokalemia: presenting potassium level noted to be 3.2.    Plan: monitor on tele. KCl 40 meq p.o. x 1 dose now. Add-on serum mag level. CMP, mag level in the AM.                     #) Sepsis due to acute cystitis: In the setting of urinalysis that is associated with a hazy appearing specimen, significant pyuria, moderate leukocyte esterase, and no evidence of squamous epithelial cells to suggest a contaminated specimen, there is concern for acute cystitis in the setting of the patient's new onset generalized weakness over the last 3 to 4 days as well as new ground-level mechanical falls over the last 2 days.  While he does not report any associated urinary symptoms, suspect that the above urinalysis may represent a urinary tract infection predisposing him to his recent ground-level mechanical falls and contributory to his presenting generalized weakness.   consequently, will treat for suspected acute cystitis, as further detailed below.  SIRS criteria met via leukocytosis with neutrophilic predominance, tachycardia. Of note, in the absence of any evidence of end organ damage, pt's sepsis does not meet criteria to be considered severe in nature.  However, We will check stat lactic acid  level at  this time.  At the present time,  in the absence of LA level greater than or equal to 4.0, and in the absence of any associated hypotension refractory to IVF's, there are no indications for administration of a 30 mL/kg IVF bolus at this time.   Additional ED work-up/management notable for: Rocephin  has been ordered in the ED, although first dose is not yet been administered.  Consequently, I have ordered stat collection of blood cultures x 2 to be collected prior to administration of IV antibiotics.  Following collection of blood cultures x 2, will initiate Rocephin .  It is noted that urine cultures are even collected this evening.  No evidence of additional underlying infectious process at this time, including no evidence of acute respiratory symptoms to increase index of suspicion for acute respiratory infection.  Plan: CBC w/ diff and CMP in AM.  Check Stat blood cultures x 2, f followed by initiation of Rocephin , as above.  Follow-up result of urine culture.  Lactated Ringer 's at 72 cc/h x 12 hours.  Stat lactic acid level every 3 hours x 2 occurrences.  As needed acetaminophen  for fever.  Monitor on telemetry.                       #) Dehydration: Clinical suspicion for such, including the appearance of dry oral mucous membranes as well as laboratory findings notable for  UA demonstrating elevated specific gravity.  Appears to be in the setting of   decline in oral intake over the last 2 to 3 days as a consequence of the patient's generalized weakness and difficulty with ambulation resultant .  No e/o associated hypotension.  Potential further exacerbation with outpatient use of chlorthalidone.   Plan: Monitor strict I's and O's.  Daily weights.  CMP in the morning. IVF's in form of lactated Ringer 's at 17 cc/h x 12 hours.  Hold home chlorthalidone for now.                  #) Subacute right ankle sprain: Patient reports recent diagnosis of right ankle  sprain approximately 3 weeks ago.  He was seen in the ED on 09/09/2023 for this, with workup at that time that included venous ultrasound of the right lower extremity which showed no evidence of acute DVT.  He reports that he swelling, tenderness associate with his right ankle is not as a consequence of his recent ground-level mechanical falls over the last 2 days, but is residual from his right ankle sprain a few weeks ago.  Plain films of the right foot/ankle today show small osseous fragments adjacent to the posterior radial process of the talus, which, per radiology read, appear to be chronic in nature, without overt evidence of acute fracture.  Regardless of acute versus chronic nature of the small osseous fragments, management appears to be conservative in nature, including pain control, physical therapy consult.  Additionally, EDP has ordered ASO right ankle brace this evening.  Right lower extremity appears neurovascularly intact.  Plan: Acetaminophen  1 g p.o. 6 hours as needed.  Continue outpatient naproxen .  Continue  ASO right ankle raise.  Physical therapy consult ordered for the morning.                    #) Type 2 Diabetes Mellitus: documented history of such. Home insulin  regimen: None.  On Jardiance as well as semaglutide as an outpatient . presenting blood sugar: 225. Most recent A1c noted to be  8.0% when checked in February 2024.  Additionally, his diabetes is complicated by diabetic peripheral polyneuropathy for which she is on gabapentin  at home.  Plan: accuchecks QAC and HS with low dose SSI.  Hold home Jardiance and semaglutide for now.  Add on hemoglobin A1c level.  Resume home gabapentin .                    #) Essential Hypertension: documented h/o such, with outpatient antihypertensive regimen including Norvasc, metoprolol  succinate, losartan, chlorthalidone.  SBP's in the ED today: 120s to 140s mmHg. in the setting of presenting sepsis, will hold  home antihypertensive medications for now.  Plan: Close monitoring of subsequent BP via routine VS. hold home antihypertensive medications for now, as above.                      #) Paroxysmal atrial fibrillation: Documented history of such. In setting of CHA2DS2-VASc score of  3, there is an indication for chronic anticoagulation for thromboembolic prophylaxis.  It appears that he was previously prescribed Eliquis  however, patient reports that he has never been on this medication nor is he on any other anticoagulant .  Rationale for the absence of formal anticoagulation is not entirely clear to me at this time. Home AV nodal blocking regimen: Metoprolol  succinate.  Additionally, he is on Multaq  as an outpatient.  Most recent echocardiogram occurred in February 2024 was notable for LVEF 60 to 65%, normal left ventricular diastolic parameters and no evidence of significant valvular pathology.will check nonurgent EKG at this time, including for evaluation of QTc given use of Multaq  as an outpatient.  Plan: monitor strict I's & O's and daily weights. CMP/CBC in AM. Check serum mag level. Continue home AV nodal blocking regimen.  Continue outpatient Multaq .  Nonurgent EKG, as above.  Monitor on telemetry.  Will attempt additional chart review to ascertain rationale behind the absence of formal anticoagulation, as above.                      #) Benign Prostatic Hyperplasia:  documented h/o such; on tamsulosin as outpatient.   Plan: monitor strict I's & O's and daily weights. Repeat CMP in AM.  continue home tamsulosin.      DVT prophylaxis: SCD's   Code Status: Full code Family Communication: none Disposition Plan: Per Rounding Team Consults called: none;  Admission status: Observation    I SPENT GREATER THAN 75  MINUTES IN CLINICAL CARE TIME/MEDICAL DECISION-MAKING IN COMPLETING THIS ADMISSION.     Galina Haddox B Taleeyah Bora DO [facility] From 7PM -  7AM   10/20/2023, 10:06 PM

## 2023-10-21 DIAGNOSIS — N400 Enlarged prostate without lower urinary tract symptoms: Secondary | ICD-10-CM | POA: Diagnosis not present

## 2023-10-21 DIAGNOSIS — I480 Paroxysmal atrial fibrillation: Secondary | ICD-10-CM | POA: Diagnosis not present

## 2023-10-21 DIAGNOSIS — R531 Weakness: Secondary | ICD-10-CM | POA: Diagnosis not present

## 2023-10-21 DIAGNOSIS — E860 Dehydration: Secondary | ICD-10-CM | POA: Diagnosis not present

## 2023-10-21 LAB — CBC WITH DIFFERENTIAL/PLATELET
Abs Immature Granulocytes: 0.11 10*3/uL — ABNORMAL HIGH (ref 0.00–0.07)
Basophils Absolute: 0.1 10*3/uL (ref 0.0–0.1)
Basophils Relative: 1 %
Eosinophils Absolute: 0.1 10*3/uL (ref 0.0–0.5)
Eosinophils Relative: 0 %
HCT: 46.3 % (ref 39.0–52.0)
Hemoglobin: 14.2 g/dL (ref 13.0–17.0)
Immature Granulocytes: 1 %
Lymphocytes Relative: 11 %
Lymphs Abs: 1.5 10*3/uL (ref 0.7–4.0)
MCH: 28 pg (ref 26.0–34.0)
MCHC: 30.7 g/dL (ref 30.0–36.0)
MCV: 91.1 fL (ref 80.0–100.0)
Monocytes Absolute: 1.2 10*3/uL — ABNORMAL HIGH (ref 0.1–1.0)
Monocytes Relative: 9 %
Neutro Abs: 10.4 10*3/uL — ABNORMAL HIGH (ref 1.7–7.7)
Neutrophils Relative %: 78 %
Platelets: 231 10*3/uL (ref 150–400)
RBC: 5.08 MIL/uL (ref 4.22–5.81)
RDW: 14.2 % (ref 11.5–15.5)
WBC: 13.2 10*3/uL — ABNORMAL HIGH (ref 4.0–10.5)
nRBC: 0 % (ref 0.0–0.2)

## 2023-10-21 LAB — GLUCOSE, CAPILLARY
Glucose-Capillary: 172 mg/dL — ABNORMAL HIGH (ref 70–99)
Glucose-Capillary: 277 mg/dL — ABNORMAL HIGH (ref 70–99)
Glucose-Capillary: 288 mg/dL — ABNORMAL HIGH (ref 70–99)
Glucose-Capillary: 225 mg/dL — ABNORMAL HIGH (ref 70–99)

## 2023-10-21 LAB — COMPREHENSIVE METABOLIC PANEL WITH GFR
ALT: 14 U/L (ref 0–44)
AST: 10 U/L — ABNORMAL LOW (ref 15–41)
Albumin: 3.2 g/dL — ABNORMAL LOW (ref 3.5–5.0)
Alkaline Phosphatase: 43 U/L (ref 38–126)
Anion gap: 13 (ref 5–15)
BUN: 14 mg/dL (ref 8–23)
CO2: 22 mmol/L (ref 22–32)
Calcium: 9 mg/dL (ref 8.9–10.3)
Chloride: 98 mmol/L (ref 98–111)
Creatinine, Ser: 0.7 mg/dL (ref 0.61–1.24)
GFR, Estimated: >60 mL/min (ref >60)
Glucose, Bld: 269 mg/dL — ABNORMAL HIGH (ref 70–99)
Potassium: 3.1 mmol/L — ABNORMAL LOW (ref 3.5–5.1)
Sodium: 133 mmol/L — ABNORMAL LOW (ref 135–145)
Total Bilirubin: 1.7 mg/dL — ABNORMAL HIGH (ref 0.0–1.2)
Total Protein: 6 g/dL — ABNORMAL LOW (ref 6.5–8.1)

## 2023-10-21 LAB — HEMOGLOBIN A1C
Hgb A1c MFr Bld: 9.3 % — ABNORMAL HIGH (ref 4.8–5.6)
Mean Plasma Glucose: 220.21 mg/dL

## 2023-10-21 LAB — MAGNESIUM: Magnesium: 1.9 mg/dL (ref 1.7–2.4)

## 2023-10-21 LAB — VITAMIN B12: Vitamin B-12: 484 pg/mL (ref 180–914)

## 2023-10-21 LAB — TSH: TSH: 1.336 u[IU]/mL (ref 0.350–4.500)

## 2023-10-21 MED ORDER — MAGNESIUM SULFATE 2 GM/50ML IV SOLN
2.0000 g | Freq: Once | INTRAVENOUS | Status: AC
  Administered 2023-10-21: 2 g via INTRAVENOUS
  Filled 2023-10-21: qty 50

## 2023-10-21 MED ORDER — POTASSIUM CHLORIDE CRYS ER 20 MEQ PO TBCR
40.0000 meq | EXTENDED_RELEASE_TABLET | Freq: Once | ORAL | Status: AC
  Administered 2023-10-21: 40 meq via ORAL
  Filled 2023-10-21: qty 2

## 2023-10-21 MED ORDER — LINAGLIPTIN 5 MG PO TABS
5.0000 mg | ORAL_TABLET | Freq: Every day | ORAL | Status: DC
  Administered 2023-10-21 – 2023-10-23 (×3): 5 mg via ORAL
  Filled 2023-10-21 (×3): qty 1

## 2023-10-21 NOTE — Progress Notes (Signed)
   10/21/23 2228  BiPAP/CPAP/SIPAP  $ Non-Invasive Home Ventilator  Initial  $ Face Mask Medium Yes  BiPAP/CPAP/SIPAP Pt Type Adult  BiPAP/CPAP/SIPAP Resmed  Mask Type Full face mask  Dentures removed? Not applicable  Mask Size Medium  EPAP 14 cmH2O  FiO2 (%) 21 %  Patient Home Machine No  Patient Home Mask No  Patient Home Tubing No  Auto Titrate No  Nasal massage performed Yes  Device Plugged into RED Power Outlet Yes  BiPAP/CPAP /SiPAP Vitals  Pulse Rate 91  Resp 16  SpO2 96 %  Bilateral Breath Sounds Diminished  MEWS Score/Color  MEWS Score 0  MEWS Score Color Marrie Sizer

## 2023-10-21 NOTE — Evaluation (Signed)
 Physical Therapy Evaluation Patient Details Name: Alan Lin MRN: [id number] DOB: 10/17/1955 Today's Date: 10/21/2023  History of Present Illness  68 yo male admitted to [facility] after several days with weakness and falls (recent R ankle sprain-right foot and right ankle showed small osseous fragments adjacent to the posterior medial process of the talus, which, per radiology report, appears to be chronic in nature.")  at his ILF [facility]. Dx: Acute cystitis, sepsis, dehydration.  PMH: OA, DM, HTN, OSA, Afib, hx of falls and rib fx, spinal stimulator  Clinical Impression  On eval, pt required Mod A +2 for safety when mobilizing. Moderate pain with activity on today-chronic neck/back pain, R ankle pain due to recent strain. Pt was able to stand and step over to recliner using a RW. Pt presents with general weakness, decreased activity tolerance, and impaired gait and balance. He currently remains at risk for falls. Patient will benefit from continued inpatient follow up therapy, <3 hours/day.         If plan is discharge home, recommend the following: A lot of help with walking and/or transfers;A lot of help with bathing/dressing/bathroom;Assistance with cooking/housework;Assist for transportation;Help with stairs or ramp for entrance   Can travel by private vehicle        Equipment Recommendations None recommended by PT  Recommendations for Other Services       Functional Status Assessment Patient has had a recent decline in their functional status and demonstrates the ability to make significant improvements in function in a reasonable and predictable amount of time.     Precautions / Restrictions Precautions Precautions: Fall Precaution/Restrictions Comments: incontinence-wears depends typically Required Braces or Orthoses: Other Brace Other Brace: R ASO Restrictions Weight Bearing Restrictions Per Provider Order: No      Mobility  Bed Mobility Overal bed mobility: Needs  Assistance Bed Mobility: Rolling, Sidelying to Sit Rolling: Mod assist, Used rails Sidelying to sit: HOB elevated, Used rails, Mod assist       General bed mobility comments: pain limits all mobility. increased time and effort for pt to complete task. assist for trunk and bil LEs. assist to scoot to EOB.    Transfers Overall transfer level: Needs assistance Equipment used: Rolling walker (2 wheels) Transfers: Sit to/from Stand, Bed to chair/wheelchair/BSC Sit to Stand: Min assist, +2 safety/equipment   Step pivot transfers: Min assist       General transfer comment: x2. Stood better on 2nd attempt. Cues for safety, technique, hand placement. Increased time    Ambulation/Gait Ambulation/Gait assistance: Min assist, +2 safety/equipment   Assistive device: Rolling walker (2 wheels)         General Gait Details: side steps along bedside with RW. cues for safety, sequencing. assist to steady.  Stairs            Wheelchair Mobility     Tilt Bed    Modified Rankin (Stroke Patients Only)       Balance Overall balance assessment: Needs assistance Sitting-balance support: Feet supported Sitting balance-Leahy Scale: Fair     Standing balance support: Bilateral upper extremity supported, During functional activity, Reliant on assistive device for balance Standing balance-Leahy Scale: Poor                               Pertinent Vitals/Pain Pain Assessment Pain Assessment: Faces Faces Pain Scale: Hurts even more Pain Location: ankle, back, neck Pain Descriptors / Indicators: Grimacing, Aching Pain Intervention(s): Limited  activity within patient's tolerance, Monitored during session, Repositioned    Home Living   Living Arrangements: Alone Available Help at Discharge: Friend(s);Available PRN/intermittently;Other (Comment) Type of Home: Independent living facility Home Access: Level entry       Home Layout: One level Home Equipment: Clinical biochemist (2 wheels);Rollator (4 wheels);Cane - single point;Shower seat;Grab bars - tub/shower;Grab bars - toilet;Hand held shower head;Adaptive equipment Additional Comments: pt reports limited/tight space in his apartment    Prior Function Prior Level of Function : History of Falls (last six months)             Mobility Comments: using 2 SPCs mostly, rollator occasionally but pt states "that thing is too fast", falls d/t balance issues ADLs Comments: mod I for BADL's, assistance for IADL's     Extremity/Trunk Assessment   Upper Extremity Assessment Upper Extremity Assessment: Defer to OT evaluation    Lower Extremity Assessment Lower Extremity Assessment: Generalized weakness RLE Deficits / Details: R ankle pain but able to weightbear       Communication   Communication Communication: No apparent difficulties    Cognition Arousal: Alert Behavior During Therapy: Flat affect                             Following commands: Intact       Cueing Cueing Techniques: Verbal cues     General Comments      Exercises     Assessment/Plan    PT Assessment Patient needs continued PT services  PT Problem List Decreased strength;Decreased range of motion;Decreased activity tolerance;Decreased balance;Decreased mobility;Decreased knowledge of use of DME;Pain       PT Treatment Interventions DME instruction;Gait training;Functional mobility training;Therapeutic activities;Therapeutic exercise;Patient/family education;Balance training    PT Goals (Current goals can be found in the Care Plan section)  Acute Rehab PT Goals Patient Stated Goal: less pain. regain plof/independence PT Goal Formulation: With patient Time For Goal Achievement: 11/04/23 Potential to Achieve Goals: Good    Frequency Min 2X/week     Co-evaluation               AM-PAC PT "6 Clicks" Mobility  Outcome Measure Help needed turning from your back to your side while in a flat bed  without using bedrails?: A Lot Help needed moving from lying on your back to sitting on the side of a flat bed without using bedrails?: A Lot Help needed moving to and from a bed to a chair (including a wheelchair)?: A Lot Help needed standing up from a chair using your arms (e.g., wheelchair or bedside chair)?: A Lot Help needed to walk in hospital room?: A Lot Help needed climbing 3-5 steps with a railing? : Total 6 Click Score: 11    End of Session Equipment Utilized During Treatment: Gait belt Activity Tolerance: Patient tolerated treatment well;Patient limited by pain Patient left: in chair;with call bell/phone within reach   PT Visit Diagnosis: Pain;History of falling (Z91.81);Muscle weakness (generalized) (M62.81);Difficulty in walking, not elsewhere classified (R26.2) Pain - part of body:  (back, neck, ankle)    Time: 1610-9604 PT Time Calculation (min) (ACUTE ONLY): 27 min   Charges:   PT Evaluation $PT Eval Low Complexity: 1 Low PT Treatments $Therapeutic Activity: 8-22 mins PT General Charges $$ ACUTE PT VISIT: 1 Visit           Tanda Falter, PT Acute Rehabilitation  Office: [phone]

## 2023-10-21 NOTE — TOC Initial Note (Signed)
 Transition of Care [facility]) - Initial/Assessment Note    Patient Details  Name: Alan Lin MRN: [id number] Date of Birth: [birth year]  Transition of [facility] CM/SW Contact:    Gertha Ku, LCSW Phone Number: 10/21/2023, 3:49 PM  Clinical Narrative:                 CSW met with pt at bedside to discuss recommendations for SNF placement. Pt is from independent Living at [facility]. Pt has agreed to SNF placement.  CSW explained the process noting pt will need insurance authorization. CSW to fax pt out for SNF placement. TOC to follow.   Expected Discharge Plan: Skilled Nursing Facility Barriers to Discharge: Continued Medical Work up   Patient Goals and CMS Choice Patient states their goals for this hospitalization and ongoing recovery are:: SNF to get stronger          Expected Discharge Plan and Services       Living arrangements for the past 2 months: Independent Living Facility                                      Prior Living Arrangements/Services Living arrangements for the past 2 months: Independent Living Facility Lives with:: Self          Need for Family Participation in Patient Care: No (Comment) Care giver support system in place?: No (comment)      Activities of Daily Living   ADL Screening (condition at time of admission) Independently performs ADLs?: Yes (appropriate for developmental age) Is the patient deaf or have difficulty hearing?: No Does the patient have difficulty seeing, even when wearing glasses/contacts?: No Does the patient have difficulty concentrating, remembering, or making decisions?: No  Permission Sought/Granted                  Emotional Assessment Appearance:: Appears stated age Attitude/Demeanor/Rapport: Gracious Affect (typically observed): Accepting Orientation: : Oriented to Self, Oriented to Place, Oriented to  Time, Oriented to Situation   Psych Involvement: No (comment)  Admission  diagnosis:  Acute cystitis with hematuria [N30.01] Generalized weakness [R53.1] Patient Active Problem List   Diagnosis Date Noted   Generalized weakness 10/20/2023   Falls, initial encounter 10/20/2023   Acute cystitis 10/20/2023   Sepsis (HCC) 10/20/2023   Dehydration 10/20/2023   Paroxysmal atrial fibrillation (HCC) 10/20/2023   BPH (benign prostatic hyperplasia) 10/20/2023   Acute esophagitis 07/24/2022   Syncope 07/21/2022   Hypomagnesemia 07/21/2022   Hypokalemia 07/21/2022   Hyperlipidemia 07/21/2022   Essential hypertension 07/21/2022   Hypophosphatemia 07/21/2022   OSA (obstructive sleep apnea) 07/21/2022   Falls 07/21/2022   Rib fracture 07/21/2022   Degeneration of lumbar intervertebral disc 11/01/2019   DDD (degenerative disc disease), cervical 10/28/2019   Cervical radiculitis 09/20/2019   Displacement 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 09/20/2019   Anxiety 07/16/2019   Right hip pain 07/30/2018   Primary osteoarthritis of both knees 07/30/2018   Pain in buttock 07/30/2018   Abdominal weakness 07/24/2017   Pain in joint, shoulder region 11/29/2016   DM2 (diabetes mellitus, type 2) (HCC) 03/15/2016   PCP:  Peri Brackett, MD Pharmacy:   [id number] - [place], [place] - [address] AT [facility] OF [address] & [address] [place] [address] Phone: [phone] Fax: [phone]     Social Drivers of Health (SDOH)  Social History: SDOH Screenings   Food Insecurity: No Food Insecurity (10/20/2023)  Housing: Low Risk  (10/21/2023)  Transportation Needs: No Transportation Needs (10/20/2023)  Utilities: Not At Risk (10/20/2023)  Financial Resource Strain: Low Risk  (08/29/2023)   Received from [facility]  Physical Activity: Insufficiently Active (08/29/2023)   Received from [facility]  Social Connections: Unknown (10/20/2023)  Tobacco Use: Low Risk  (10/20/2023)  Health Literacy: Low Risk  (08/29/2023)    Received from [facility]   SDOH Interventions:     Readmission Risk Interventions     No data to display

## 2023-10-21 NOTE — Progress Notes (Signed)
 PROGRESS NOTE    Alan Lin  WJX:[id number] DOB: 08/28/1955 DOA: 10/20/2023 PCP: Peri Brackett, MD    Brief Narrative:   Alan Lin is a 68 y.o. male with medical history significant for type 2 diabetes mellitus, essential hypertension, paroxysmal atrial fibrillation not on chronic anticoagulation, who is admitted to [facility] on 10/20/2023 with generalized weakness after presenting from home to [facility] ED complaining of generalized weakness. Found to have a UTI.  Await culture and PT Eval since he is from ILF. Overall he is feeling much better  Assessment and Plan: Generalized weakness:   -prob from UTI -PT eval pending    Ground-level mechanical falls:  -PT eval -from ILF    Sepsis due to acute cystitis:  -urinalysis that is associated with a hazy appearing specimen, significant pyuria, moderate leukocyte esterase, and no evidence of squamous epithelial cells to suggest a contaminated specimen, there is concern for acute cystitis in the setting of the patient's new onset generalized weakness over the last 3 to 4 days as well as new ground-level mechanical falls over the last 2 days.    -IV abx   Dehydration:  -s/p IVF    Hypokalemia -replete  Subacute right ankle sprain: Patient reports recent diagnosis of right ankle sprain approximately 3 weeks ago.  He was seen in the ED on 09/09/2023 for this, with workup at that time that included venous ultrasound of the right lower extremity which showed no evidence of acute DVT.   -Continue  ASO right ankle raise -PT consult pending     Type 2 Diabetes Mellitus: -SSI while in hospital- can treat for > 200  -resume home meds upon d/c but may need to stop jardiance if recurrent utis   Essential Hypertension: -resume home meds      Paroxysmal atrial fibrillation: Documented history of such. In setting of CHA2DS2-VASc score of  3, - Metoprolol  succinate.  Additionally, he is on Multaq  as an outpatient.  Most recent  echocardiogram occurred in February 2024 was notable for LVEF 60 to 65%, normal left ventricular diastolic parameters and no evidence of significant valvular pathology.will check nonurgent EKG at this time, including for evaluation of QTc given use of Multaq  as an outpatient. -per chart review: Given frequent falls, agree with patient's hesitancy to start therapeutic anticoagulation.      Benign Prostatic Hyperplasia - continue home tamsulosin.        DVT prophylaxis: SCDs Start: 10/20/23 2201    Code Status: Full Code   Disposition Plan:  Level of care: Telemetry Status is: Observation Home once    Consultants:    Subjective: Feeling much better  Objective: Vitals:   10/21/23 0500 10/21/23 0634 10/21/23 0719 10/21/23 1114  BP:  135/61 (!) 144/68 (!) 148/66  Pulse:  87 92 (!) 101  Resp:  16 18 18   Temp:  98.3 F (36.8 C) 99.1 F (37.3 C) 99.5 F (37.5 C)  TempSrc:  Oral Oral Oral  SpO2:  90% 91% 91%  Weight: 111.6 kg     Height:        Intake/Output Summary (Last 24 hours) at 10/21/2023 1234 Last data filed at 10/21/2023 0900 Gross per 24 hour  Intake 570 ml  Output 550 ml  Net 20 ml   Filed Weights   10/20/23 2305 10/21/23 0500  Weight: 110 kg 111.6 kg    Examination:   General: Appearance:    Obese male in no acute distress  Lungs:      respirations unlabored  Heart:    Tachycardic. irreg   MS:   All extremities are intact.    Neurologic:   Awake, alert       Data Reviewed: I have personally reviewed following labs and imaging studies  CBC: Recent Labs  Lab 10/20/23 1809 10/21/23 0914  WBC 15.8* 13.2*  NEUTROABS 13.1* 10.4*  HGB 15.2 14.2  HCT 46.6 46.3  MCV 86.1 91.1  PLT 222 231   Basic Metabolic Panel: Recent Labs  Lab 10/20/23 1809 10/21/23 0914  NA 135 133*  K 3.2* 3.1*  CL 95* 98  CO2 25 22  GLUCOSE 225* 269*  BUN 13 14  CREATININE 0.75 0.70  CALCIUM 9.5 9.0  MG 2.1 1.9   GFR: Estimated Creatinine Clearance:  112 mL/min (by C-G formula based on SCr of 0.7 mg/dL). Liver Function Tests: Recent Labs  Lab 10/20/23 1809 10/21/23 0914  AST 14* 10*  ALT 18 14  ALKPHOS 53 43  BILITOT 1.9* 1.7*  PROT 7.2 6.0*  ALBUMIN 4.0 3.2*   No results for input(s): "LIPASE", "AMYLASE" in the last 168 hours. No results for input(s): "AMMONIA" in the last 168 hours. Coagulation Profile: No results for input(s): "INR", "PROTIME" in the last 168 hours. Cardiac Enzymes: Recent Labs  Lab 10/20/23 1809  CKTOTAL 42*   BNP (last 3 results) No results for input(s): "PROBNP" in the last 8760 hours. HbA1C: Recent Labs    10/20/23 1809  HGBA1C 9.3*   CBG: Recent Labs  Lab 10/21/23 0726 10/21/23 1110  GLUCAP 172* 277*   Lipid Profile: No results for input(s): "CHOL", "HDL", "LDLCALC", "TRIG", "CHOLHDL", "LDLDIRECT" in the last 72 hours. Thyroid Function Tests: Recent Labs    10/20/23 2338  TSH 1.336   Anemia Panel: Recent Labs    10/20/23 2338  VITAMINB12 484   Sepsis Labs: Recent Labs  Lab 10/20/23 2244  LATICACIDVEN 1.3    No results found for this or any previous visit (from the past 240 hours).       Radiology Studies: DG Hip Unilat W or Wo Pelvis 2-3 Views Right Result Date: 10/20/2023 CLINICAL DATA:  Fall, pain EXAM: DG HIP (WITH OR WITHOUT PELVIS) 2-3V RIGHT COMPARISON:  None Available. FINDINGS: Hip joints and SI joints symmetric. No acute bony abnormality. Specifically, no fracture, subluxation, or dislocation. IMPRESSION: No acute bony abnormality. Electronically Signed   By: Janeece Mechanic M.D.   On: 10/20/2023 21:06   DG Lumbar Spine Complete Result Date: 10/20/2023 CLINICAL DATA:  Fall, pain EXAM: LUMBAR SPINE - COMPLETE 4+ VIEW COMPARISON:  06/26/2023 FINDINGS: Normal alignment. No fracture. Diffuse degenerative disc and facet disease. SI joints symmetric and unremarkable. InterStim device un changed. Diffuse aortic atherosclerosis. No aneurysm. IMPRESSION: No acute bony  abnormality. Electronically Signed   By: Janeece Mechanic M.D.   On: 10/20/2023 21:05   DG Ankle Complete Right Result Date: 10/20/2023 CLINICAL DATA:  Fall, decreased mobility EXAM: RIGHT ANKLE - COMPLETE 3+ VIEW; RIGHT FOOT COMPLETE - 3+ VIEW COMPARISON:  09/12/2023 FINDINGS: Small osseous fragments adjacent to the posteromedial process of the talus are favored chronic but could represent a small avulsion fracture. Recommend correlation with site of pain. No dislocation. Degenerative change about the midfoot and first MTP joint. Plantar calcaneal spur. IMPRESSION: Small osseous fragments adjacent to the posteromedial process of the talus are favored chronic but could represent a small avulsion fracture. Recommend correlation with site of pain. Electronically Signed   By:  Rozell Cornet M.D.   On: 10/20/2023 19:16   DG Foot Complete Right Result Date: 10/20/2023 CLINICAL DATA:  Fall, decreased mobility EXAM: RIGHT ANKLE - COMPLETE 3+ VIEW; RIGHT FOOT COMPLETE - 3+ VIEW COMPARISON:  09/12/2023 FINDINGS: Small osseous fragments adjacent to the posteromedial process of the talus are favored chronic but could represent a small avulsion fracture. Recommend correlation with site of pain. No dislocation. Degenerative change about the midfoot and first MTP joint. Plantar calcaneal spur. IMPRESSION: Small osseous fragments adjacent to the posteromedial process of the talus are favored chronic but could represent a small avulsion fracture. Recommend correlation with site of pain. Electronically Signed   By: Rozell Cornet M.D.   On: 10/20/2023 19:16        Scheduled Meds:  atorvastatin  10 mg Oral Daily   dronedarone   400 mg Oral BID   DULoxetine   60 mg Oral Daily   gabapentin   1,500 mg Oral QHS   gabapentin   300 mg Oral Daily   insulin  aspart  0-9 Units Subcutaneous TID WC   metoprolol  succinate  25 mg Oral Daily   tamsulosin  0.4 mg Oral Daily   Continuous Infusions:  cefTRIAXone  (ROCEPHIN )  IV        LOS: 0 days    Time spent: 45 minutes spent on chart review, discussion with nursing staff, consultants, updating family and interview/physical exam; more than 50% of that time was spent in counseling and/or coordination of care.    Enrigue Harvard, DO [facility] Available via Epic secure chat 7am-7pm After these hours, please refer to coverage provider listed on [email or web address] 10/21/2023, 12:34 PM

## 2023-10-21 NOTE — Evaluation (Signed)
 Occupational Therapy Evaluation Patient Details Name: JERAMIA LAGRIMAS MRN: [id number] DOB: [birth year] Today's Date: 10/21/2023   History of Present Illness   Dixie Schnipke is a 68 yo male admitted to [facility] after several days with weakness and falls (recent R ankle sprain-right foot and right ankle showed small osseous fragments adjacent to the posterior medial process of the talus, which, per radiology report, appears to be chronic in nature.")  at his ILF [facility]. Dx: Acute cystitis, sepsis, dehydration. Issued R ankle brace.  PMH: OA, DM, HTN, OSA, Afib, hx of falls and rib fx     Clinical Impressions PTA, patient lives alone in ILF and was using 2 SPC's for mobility and managing BADL's and driving to OPPT 3x per week. Has meals and some IADL support and a close friend circle. Patient reports ongoing issues related to bowel and urine incontinence, pain and balance contributing to recent falls- now with R ankle sprain and new R lace up ankle brace in place. Currently, patient presents with deficits outlined below (see OT Problem List for details) most significantly back and LE pain, bowel and urine incontinence, strength, activity tolerance and balance deficits impacting BADL's and functional mobility. Based on report of multiple falls and living alone at ILF, patient will benefit from continued inpatient follow up therapy, <3 hours/day. OT will follow acutely to enable progression of functional performance.   To note: attempted [facility] transfer with loose stool emergent need, NT and OT utilized bed pan as commode too small for patients body habitus.. OT ordered appropriate wide commode via charge nurse.    If plan is discharge home, recommend the following:   A lot of help with bathing/dressing/bathroom;A lot of help with walking and/or transfers;Assistance with cooking/housework;Direct supervision/assist for medications management;Direct supervision/assist for financial management;Assist for  transportation;Help with stairs or ramp for entrance     Functional Status Assessment   Patient has had a recent decline in their functional status and demonstrates the ability to make significant improvements in function in a reasonable and predictable amount of time.     Equipment Recommendations   BSC/3in1- wide/heavy duty if discharged back to ILF       Precautions/Restrictions   Precautions Precautions: Fall Required Braces or Orthoses: Other Brace (R ankle lace up brace) Restrictions Weight Bearing Restrictions Per Provider Order: No     Mobility Bed Mobility Overal bed mobility: Needs Assistance Bed Mobility: Rolling, Sidelying to Sit, Sit to Supine Rolling: Mod assist, Used rails Sidelying to sit: Mod assist, HOB elevated, Used rails   Sit to supine: Mod assist, HOB elevated, Used rails   General bed mobility comments: pain limits all mobility, patient reports spinal stimulator implant and has pain with most touch on back    Transfers Overall transfer level: Needs assistance (attempted [facility] transfer with loose stool emergent need, NT and OT utilized bed pan as commode too small for size, OT ordered appropriate wide commode via charge)                        Balance Overall balance assessment: Needs assistance Sitting-balance support: Bilateral upper extremity supported, Feet supported Sitting balance-Leahy Scale: Fair Sitting balance - Comments: unable to maintian balance testing further due to toileting needs once moved                                   ADL either performed or  assessed with clinical judgement   ADL Overall ADL's : Needs assistance/impaired Eating/Feeding: Set up;Bed level   Grooming: Wash/dry hands;Wash/dry face;Oral care;Set up;Bed level   Upper Body Bathing: Minimal assistance   Lower Body Bathing: Total assistance;Sitting/lateral leans;Bed level Lower Body Bathing Details (indicate cue type and reason):  unable to manage any low body bathing due to pain and urine incontinence and loose stool Upper Body Dressing : Minimal assistance   Lower Body Dressing: Total assistance;Bed level Lower Body Dressing Details (indicate cue type and reason): unable to manage incontinence brief nor LB garments/R ankle brace Toilet Transfer: Requires wide/bariatric Toilet Transfer Details (indicate cue type and reason): attempted [facility] transfer with loose stool emergent need, NT and OT utilized bed pan as commode too small for size, OT ordered appropriate wide commode via charge Toileting- Architect and Hygiene: Total assistance       Functional mobility during ADLs:  (pain limited all mobility) General ADL Comments: difficulty with all functional reach to lower body, patient unaware of urine incontinence nor anticipation for loose stool occurence emergently     Vision Baseline Vision/History: 1 Wears glasses;0 No visual deficits Ability to See in Adequate Light: 0 Adequate Patient Visual Report: No change from baseline Vision Assessment?: No apparent visual deficits;Wears glasses for reading     Perception Perception: Within Functional Limits       Praxis Praxis: [facility]       Pertinent Vitals/Pain Pain Assessment Pain Assessment: 0-10 Pain Score: 4  Pain Location: R ankle, back, B feet Pain Descriptors / Indicators: Sore, Aching Pain Intervention(s): Repositioned, Patient requesting pain meds-RN notified, Ice applied, Relaxation, RN gave pain meds during session     Extremity/Trunk Assessment Upper Extremity Assessment Upper Extremity Assessment: Right hand dominant;Overall [facility] for tasks assessed   Lower Extremity Assessment Lower Extremity Assessment: Generalized weakness;RLE deficits/detail;LLE deficits/detail;Defer to PT evaluation RLE Deficits / Details: R ankle brace wih pain RLE Sensation: decreased light touch LLE: Unable to fully assess due to pain LLE Sensation: decreased  light touch   Cervical / Trunk Assessment Cervical / Trunk Assessment: Other exceptions (back pain and hx of spinal stimulator)   Communication Communication Communication: No apparent difficulties   Cognition Arousal: Alert Behavior During Therapy: Flat affect (low frustration tolerance) Cognition: No apparent impairments             OT - Cognition Comments: patient did not relaize urine incontiene in brief not initiate asking for pain meds prior to OT suggestion                 Following commands: Intact       Cueing  General Comments   Cueing Techniques: Verbal cues  poor functional reach to assist with incontinence   Exercises     Shoulder Instructions      Home Living Family/patient expects to be discharged to:: Private residence (ILF [employer]) Living Arrangements: Alone Available Help at Discharge: Friend(s);Available PRN/intermittently;Other (Comment) (laundry, cleaning and meals) Type of Home: Independent living facility Home Access: Level entry;Elevator (apt on ground floor and can access thru basement door)     Home Layout: One level     Bathroom Shower/Tub: Walk-in shower;Curtain   Bathroom Toilet: Standard Bathroom Accessibility: No   Home Equipment: Agricultural consultant (2 wheels);Rollator (4 wheels);Cane - single point;Shower seat;Grab bars - tub/shower;Grab bars - toilet;Hand held shower head;Adaptive equipment   Additional Comments: 2 SPC's, RW for transh management      Prior Functioning/Environment Prior Level of Function :  Independent/Modified Independent;Driving;History of Falls (last six months)             Mobility Comments: OPPT using 2 SPC's mostly, RW occasionally, falls d/t balance issues ADLs Comments: mod I for BADL's, assistance for IADL's    OT Problem List: Decreased strength;Decreased activity tolerance;Impaired balance (sitting and/or standing);Impaired sensation;Obesity;Pain   OT Treatment/Interventions:  Self-care/ADL training;Therapeutic exercise;Energy conservation;DME and/or AE instruction;Therapeutic activities;Patient/family education;Balance training      OT Goals(Current goals can be found in the care plan section)   Acute Rehab OT Goals Patient Stated Goal: to get my bowels under control and walk OT Goal Formulation: With patient Time For Goal Achievement: 11/04/23 Potential to Achieve Goals: Good ADL Goals Pt Will Perform Lower Body Bathing: sitting/lateral leans;sit to/from stand;with adaptive equipment;with contact guard assist Pt Will Perform Lower Body Dressing: with contact guard assist;sitting/lateral leans;sit to/from stand;with adaptive equipment Pt Will Transfer to Toilet: with min assist;with +2 assist;bedside commode;ambulating;grab bars Pt Will Perform Toileting - Clothing Manipulation and hygiene: sitting/lateral leans;sit to/from stand;with adaptive equipment;with min assist   OT Frequency:  Min 2X/week       AM-PAC OT "6 Clicks" Daily Activity     Outcome Measure Help from another person eating meals?: A Little Help from another person taking care of personal grooming?: A Little Help from another person toileting, which includes using toliet, bedpan, or urinal?: Total Help from another person bathing (including washing, rinsing, drying)?: A Lot Help from another person to put on and taking off regular upper body clothing?: A Little Help from another person to put on and taking off regular lower body clothing?: A Lot 6 Click Score: 14   End of Session Equipment Utilized During Treatment: Gait belt;Other (comment) (R ankle lace up brace) Nurse Communication: Mobility status;Patient requests pain meds;Other (comment) (output on Flowsheets and coordination with NT and charge for wide commode)  Activity Tolerance: Patient limited by pain Patient left: in bed;with call bell/phone within reach;with bed alarm set  OT Visit Diagnosis: Unsteadiness on feet  (R26.81);Repeated falls (R29.6);Muscle weakness (generalized) (M62.81);Pain                Time: 9629-5284 OT Time Calculation (min): 58 min Charges:  OT General Charges $OT Visit: 1 Visit OT Evaluation $OT Eval Moderate Complexity: 1 Mod OT Treatments $Self Care/Home Management : 38-52 mins  Bijon Mineer OT/L Acute Rehabilitation Department  [phone]  10/21/2023, 11:10 AM

## 2023-10-21 NOTE — NC FL2 (Signed)
 [place]  MEDICAID FL2 LEVEL OF CARE FORM     IDENTIFICATION  Patient Name: Alan Lin Birthdate: 1956-04-22 Sex: male Admission Date (Current Location): 10/20/2023  [facility] and [place] Number:  Producer, television/film/video and Address:  [facility],  [address] [place], [place] [address]      Provider Number: [id number]  Attending Physician Name and Address:  Enrigue Harvard, DO  Relative Name and Phone Number:       Current Level of Care: Hospital Recommended Level of Care: Skilled Nursing Facility Prior Approval Number:    Date Approved/Denied:   PASRR Number: [id number]  Discharge Plan: Home    Current Diagnoses: Patient Active Problem List   Diagnosis Date Noted   Generalized weakness 10/20/2023   Falls, initial encounter 10/20/2023   Acute cystitis 10/20/2023   Sepsis (HCC) 10/20/2023   Dehydration 10/20/2023   Paroxysmal atrial fibrillation (HCC) 10/20/2023   BPH (benign prostatic hyperplasia) 10/20/2023   Acute esophagitis 07/24/2022   Syncope 07/21/2022   Hypomagnesemia 07/21/2022   Hypokalemia 07/21/2022   Hyperlipidemia 07/21/2022   Essential hypertension 07/21/2022   Hypophosphatemia 07/21/2022   OSA (obstructive sleep apnea) 07/21/2022   Falls 07/21/2022   Rib fracture 07/21/2022   Degeneration of lumbar intervertebral disc 11/01/2019   DDD (degenerative disc disease), cervical 10/28/2019   Cervical radiculitis 09/20/2019   Displacement 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 09/20/2019   Anxiety 07/16/2019   Right hip pain 07/30/2018   Primary osteoarthritis of both knees 07/30/2018   Pain in buttock 07/30/2018   Abdominal weakness 07/24/2017   Pain in joint, shoulder region 11/29/2016   DM2 (diabetes mellitus, type 2) (HCC) 03/15/2016    Orientation RESPIRATION BLADDER Height & Weight     Self, Time, Situation, Place  Normal Continent Weight: 246 lb 0.5 oz (111.6 kg) Height:  5\' 10"  (177.8 cm)  BEHAVIORAL SYMPTOMS/MOOD  NEUROLOGICAL BOWEL NUTRITION STATUS      Continent Diet (Carb Mod)  AMBULATORY STATUS COMMUNICATION OF NEEDS Skin   Limited Assist Verbally Normal                       Personal Care Assistance Level of Assistance  Bathing, Feeding, Dressing Bathing Assistance: Limited assistance Feeding assistance: Independent Dressing Assistance: Limited assistance     Functional Limitations Info  Sight, Hearing, Speech Sight Info: Impaired (glasses) Hearing Info: Adequate Speech Info: Adequate    SPECIAL CARE FACTORS FREQUENCY  PT (By licensed PT), OT (By licensed OT)     PT Frequency: 5 x a week OT Frequency: 5 x a week            Contractures Contractures Info: Not present    Additional Factors Info  Code Status, Allergies Code Status Info: full Allergies Info: Metformin  Oxycodone  Oxycodone-acetaminophen   Lisinopril  Morphine  Morphine And Codeine           Current Medications (10/21/2023):  This is the current hospital active medication list Current Facility-Administered Medications  Medication Dose Route Frequency Provider Last Rate Last Admin   acetaminophen  (TYLENOL ) tablet 1,000 mg  1,000 mg Oral Q6H PRN Howerter, Justin B, DO   1,000 mg at 10/20/23 2329   Or   acetaminophen  (TYLENOL ) suppository 650 mg  650 mg Rectal Q6H PRN Howerter, Justin B, DO       atorvastatin (LIPITOR) tablet 10 mg  10 mg Oral Daily Howerter, Justin B, DO   10 mg at 10/21/23 0805   cefTRIAXone  (  ROCEPHIN ) 1 g in sodium chloride  0.9 % 100 mL IVPB  1 g Intravenous Q24H Howerter, Justin B, DO       dronedarone  (MULTAQ ) tablet 400 mg  400 mg Oral BID Howerter, Justin B, DO   400 mg at 10/21/23 0805   DULoxetine  (CYMBALTA ) DR capsule 60 mg  60 mg Oral Daily Howerter, Justin B, DO   60 mg at 10/21/23 0804   gabapentin  (NEURONTIN ) capsule 1,500 mg  1,500 mg Oral QHS Howerter, Justin B, DO   1,500 mg at 10/20/23 2322   gabapentin  (NEURONTIN ) capsule 300 mg  300 mg Oral Daily Howerter, Justin B, DO    300 mg at 10/21/23 0804   linagliptin (TRADJENTA) tablet 5 mg  5 mg Oral Daily Vann, Jessica U, DO   5 mg at 10/21/23 1447   melatonin tablet 3 mg  3 mg Oral QHS PRN Howerter, Justin B, DO       methocarbamol  (ROBAXIN ) tablet 500 mg  500 mg Oral PRN Howerter, Justin B, DO       metoprolol  succinate (TOPROL -XL) 24 hr tablet 25 mg  25 mg Oral Daily Howerter, Justin B, DO   25 mg at 10/21/23 1610   ondansetron  (ZOFRAN ) injection 4 mg  4 mg Intravenous Q6H PRN Howerter, Justin B, DO       potassium chloride  SA (KLOR-CON  M) CR tablet 40 mEq  40 mEq Oral Once Vann, Jessica U, DO       tamsulosin (FLOMAX) capsule 0.4 mg  0.4 mg Oral Daily Howerter, Justin B, DO   0.4 mg at 10/21/23 0805     Discharge Medications: Please see discharge summary for a list of discharge medications.  Relevant Imaging Results:  Relevant Lab Results:   Additional Information SSN:[id number]  Arta Lark Micheale Schlack, LCSW

## 2023-10-21 NOTE — Care Management Obs Status (Signed)
 MEDICARE OBSERVATION STATUS NOTIFICATION   Patient Details  Name: Alan Lin MRN: [id number] Date of Birth: [birth year]   Medicare Observation Status Notification Given:  Yes    Gertha Ku, LCSW 10/21/2023, 3:40 PM

## 2023-10-22 DIAGNOSIS — R531 Weakness: Secondary | ICD-10-CM | POA: Diagnosis not present

## 2023-10-22 LAB — GLUCOSE, CAPILLARY
Glucose-Capillary: 379 mg/dL — ABNORMAL HIGH (ref 70–99)
Glucose-Capillary: 239 mg/dL — ABNORMAL HIGH (ref 70–99)
Glucose-Capillary: 247 mg/dL — ABNORMAL HIGH (ref 70–99)
Glucose-Capillary: 257 mg/dL — ABNORMAL HIGH (ref 70–99)

## 2023-10-22 MED ORDER — DICLOFENAC SODIUM 1 % EX GEL
2.0000 g | Freq: Four times a day (QID) | CUTANEOUS | Status: DC
  Administered 2023-10-22 – 2023-10-23 (×6): 2 g via TOPICAL
  Filled 2023-10-22: qty 100

## 2023-10-22 MED ORDER — INSULIN GLARGINE-YFGN 100 UNIT/ML ~~LOC~~ SOLN
15.0000 [IU] | Freq: Every day | SUBCUTANEOUS | Status: DC
  Administered 2023-10-22 – 2023-10-23 (×2): 15 [IU] via SUBCUTANEOUS
  Filled 2023-10-22 (×2): qty 0.15

## 2023-10-22 MED ORDER — RISAQUAD PO CAPS
2.0000 | ORAL_CAPSULE | Freq: Every day | ORAL | Status: DC
Start: 2023-10-22 — End: 2023-10-23
  Administered 2023-10-22 – 2023-10-23 (×2): 2 via ORAL
  Filled 2023-10-22 (×2): qty 2

## 2023-10-22 NOTE — Plan of Care (Signed)
  Problem: Education: Goal: Knowledge of General Education information will improve Description: Including pain rating scale, medication(s)/side effects and non-pharmacologic comfort measures Outcome: Progressing   Problem: Health Behavior/Discharge Planning: Goal: Ability to manage health-related needs will improve Outcome: Progressing   Problem: Clinical Measurements: Goal: Ability to maintain clinical measurements within normal limits will improve Outcome: Progressing Goal: Diagnostic test results will improve Outcome: Progressing Goal: Respiratory complications will improve Outcome: Progressing   Problem: Activity: Goal: Risk for activity intolerance will decrease Outcome: Progressing   Problem: Nutrition: Goal: Adequate nutrition will be maintained Outcome: Progressing   Problem: Coping: Goal: Level of anxiety will decrease Outcome: Progressing   Problem: Pain Managment: Goal: General experience of comfort will improve and/or be controlled Outcome: Progressing   Problem: Safety: Goal: Ability to remain free from injury will improve Outcome: Progressing   Problem: Education: Goal: Ability to describe self-care measures that may prevent or decrease complications (Diabetes Survival Skills Education) will improve Outcome: Progressing   Problem: Metabolic: Goal: Ability to maintain appropriate glucose levels will improve Outcome: Progressing

## 2023-10-22 NOTE — TOC Progression Note (Signed)
 Transition of Care [facility]) - Progression Note    Patient Details  Name: Alan Lin MRN: [id number] Date of Birth: 1956-05-11  [facility] CM/SW Contact  Gertha Ku, LCSW Phone Number: 10/22/2023, 3:44 PM  Clinical Narrative:    Csw presented bed offers, pt is requesting time to review. Pt also requesting CSW to contact his brother to provide an update.   CSW spoke with pt's brother to provided an update. Pt's brother stated pt would like [facility] for SNF placement. CSW spoke to Jasmine to inform her insurance [employer] will be started. TOC to follow.   Expected Discharge Plan: Skilled Nursing Facility Barriers to Discharge: Continued Medical Work up  Expected Discharge Plan and Services       Living arrangements for the past 2 months: Independent Living Facility                                       Social Determinants of Health (SDOH) Interventions SDOH Screenings   Food Insecurity: No Food Insecurity (10/20/2023)  Housing: Low Risk  (10/21/2023)  Transportation Needs: No Transportation Needs (10/20/2023)  Utilities: Not At Risk (10/20/2023)  Financial Resource Strain: Low Risk  (08/29/2023)   Received from [facility]  Physical Activity: Insufficiently Active (08/29/2023)   Received from [facility]  Social Connections: Unknown (10/20/2023)  Tobacco Use: Low Risk  (10/20/2023)  Health Literacy: Low Risk  (08/29/2023)   Received from [facility]    Readmission Risk Interventions     No data to display

## 2023-10-22 NOTE — Progress Notes (Signed)
 PROGRESS NOTE    BYARD DUSTON  YQM:[id number] DOB: Jul 20, 1955 DOA: 10/20/2023 PCP: Peri Brackett, MD    Brief Narrative:   ZYRION NIKOLIC is a 68 y.o. male with medical history significant for type 2 diabetes mellitus, essential hypertension, paroxysmal atrial fibrillation not on chronic anticoagulation, who is admitted to [facility] on 10/20/2023 with generalized weakness after presenting from home to [facility] ED complaining of generalized weakness. Found to have a UTI.  Await culture and PT Eval since he is from ILF. Overall he is feeling much better.    Assessment and Plan: Generalized weakness:   -prob from UTI -PT eval- SNF    Ground-level mechanical falls:  -PT eval- SNF -from ILF    Sepsis due to acute cystitis:  -urinalysis that is associated with a hazy appearing specimen, significant pyuria, moderate leukocyte esterase, and no evidence of squamous epithelial cells to suggest a contaminated specimen, there is concern for acute cystitis in the setting of the patient's new onset generalized weakness over the last 3 to 4 days as well as new ground-level mechanical falls over the last 2 days.    -IV abx until culture results   Dehydration:  -s/p IVF    Hypokalemia -repleted  Subacute right ankle sprain: Patient reports recent diagnosis of right ankle sprain approximately 3 weeks ago.  He was seen in the ED on 09/09/2023 for this, with workup at that time that included venous ultrasound of the right lower extremity which showed no evidence of acute DVT.   -Continue  ASO right ankle raise -added voltaren gel     Type 2 Diabetes Mellitus: Refusing sliding scale insulin  while in the hospital - Added Tradjenta but sugar still elevated - Agreeable to once daily Lantus so started on 5 4 -resume home meds upon d/c but may need to stop jardiance with utis   Essential Hypertension: -resume home meds      Paroxysmal atrial fibrillation: Documented history of such. In  setting of CHA2DS2-VASc score of  3, - Metoprolol  succinate.  Additionally, he is on Multaq  as an outpatient.  Most recent echocardiogram occurred in February 2024 was notable for LVEF 60 to 65%, normal left ventricular diastolic parameters and no evidence of significant valvular pathology.will check nonurgent EKG at this time, including for evaluation of QTc given use of Multaq  as an outpatient. -per chart review: Given frequent falls, agree with patient's hesitancy to start therapeutic anticoagulation.      Benign Prostatic Hyperplasia - continue home tamsulosin.    Obesity Estimated body mass index is 34.64 kg/m as calculated from the following:   Height as of this encounter: 5\' 10"  (1.778 m).   Weight as of this encounter: 109.5 kg.    DVT prophylaxis: SCDs Start: 10/20/23 2201    Code Status: Full Code   Disposition Plan:  Level of care: Telemetry Status is: Observation Needs SNF placement  Consultants:    Subjective: Agreeable for SNF  Objective: Vitals:   10/21/23 2112 10/21/23 2228 10/21/23 2235 10/22/23 0508  BP: (!) 173/81  136/73 (!) 148/75  Pulse: (!) 102 91 94 97  Resp: 18 16  18   Temp: 97.8 F (36.6 C)   98.9 F (37.2 C)  TempSrc: Oral   Oral  SpO2: 91% 96%  92%  Weight:    109.5 kg  Height:        Intake/Output Summary (Last 24 hours) at 10/22/2023 1132 Last data filed at 10/22/2023 1015 Gross per 24 hour  Intake 960 ml  Output 350 ml  Net 610 ml   Filed Weights   10/20/23 2305 10/21/23 0500 10/22/23 0508  Weight: 110 kg 111.6 kg 109.5 kg    Examination:   General: Appearance:    Obese male in no acute distress     Lungs:      respirations unlabored  Heart:    Normal heart rate. irreg   MS:   All extremities are intact.    Neurologic:   Awake, alert       Data Reviewed: I have personally reviewed following labs and imaging studies  CBC: Recent Labs  Lab 10/20/23 1809 10/21/23 0914  WBC 15.8* 13.2*  NEUTROABS 13.1* 10.4*  HGB  15.2 14.2  HCT 46.6 46.3  MCV 86.1 91.1  PLT 222 231   Basic Metabolic Panel: Recent Labs  Lab 10/20/23 1809 10/21/23 0914  NA 135 133*  K 3.2* 3.1*  CL 95* 98  CO2 25 22  GLUCOSE 225* 269*  BUN 13 14  CREATININE 0.75 0.70  CALCIUM 9.5 9.0  MG 2.1 1.9   GFR: Estimated Creatinine Clearance: 111 mL/min (by C-G formula based on SCr of 0.7 mg/dL). Liver Function Tests: Recent Labs  Lab 10/20/23 1809 10/21/23 0914  AST 14* 10*  ALT 18 14  ALKPHOS 53 43  BILITOT 1.9* 1.7*  PROT 7.2 6.0*  ALBUMIN 4.0 3.2*   No results for input(s): "LIPASE", "AMYLASE" in the last 168 hours. No results for input(s): "AMMONIA" in the last 168 hours. Coagulation Profile: No results for input(s): "INR", "PROTIME" in the last 168 hours. Cardiac Enzymes: Recent Labs  Lab 10/20/23 1809  CKTOTAL 42*   BNP (last 3 results) No results for input(s): "PROBNP" in the last 8760 hours. HbA1C: Recent Labs    10/20/23 1809  HGBA1C 9.3*   CBG: Recent Labs  Lab 10/21/23 0726 10/21/23 1110 10/21/23 1614 10/21/23 2114 10/22/23 0725  GLUCAP 172* 277* 288* 225* 379*   Lipid Profile: No results for input(s): "CHOL", "HDL", "LDLCALC", "TRIG", "CHOLHDL", "LDLDIRECT" in the last 72 hours. Thyroid Function Tests: Recent Labs    10/20/23 2338  TSH 1.336   Anemia Panel: Recent Labs    10/20/23 2338  VITAMINB12 484   Sepsis Labs: Recent Labs  Lab 10/20/23 2244  LATICACIDVEN 1.3    Recent Results (from the past 240 hours)  Urine Culture     Status: None (Preliminary result)   Collection Time: 10/20/23  6:21 PM   Specimen: Urine, Clean Catch  Result Value Ref Range Status   Specimen Description   Final    URINE, CLEAN CATCH Performed at [facility], [address] [place], [place] [address]    Special Requests   Final    NONE Performed at [facility], [address] [place], [place] [address]    Culture   Final    CULTURE REINCUBATED  FOR BETTER GROWTH Performed at [facility], [address], [place], [place] [address]    Report Status PENDING  Incomplete  Culture, blood (Routine X 2) w Reflex to ID Panel     Status: None (Preliminary result)   Collection Time: 10/20/23 10:30 PM   Specimen: BLOOD  Result Value Ref Range Status   Specimen Description   Final    BLOOD RIGHT ANTECUBITAL Performed at [facility], [address], [place], [place] [address]    Special Requests   Final    BOTTLES DRAWN  AEROBIC AND ANAEROBIC Blood Culture adequate volume Performed at [facility], [address], [place], [place] [address]    Culture   Final    NO GROWTH 1 DAY Performed at [facility], [address] [place], [place] [address]    Report Status PENDING  Incomplete  Culture, blood (Routine X 2) w Reflex to ID Panel     Status: None (Preliminary result)   Collection Time: 10/20/23 11:38 PM   Specimen: BLOOD  Result Value Ref Range Status   Specimen Description   Final    BLOOD SITE NOT SPECIFIED Performed at [facility], [address] [place], [place] [address]    Special Requests   Final    BOTTLES DRAWN AEROBIC ONLY Blood Culture results may not be optimal due to an inadequate volume of blood received in culture bottles Performed at [facility], [address] [place], [place] [address]    Culture   Final    NO GROWTH 1 DAY Performed at [facility], [address] [place], [place] [address]    Report Status PENDING  Incomplete         Radiology Studies: DG Hip Unilat W or Wo Pelvis 2-3 Views Right Result Date: 10/20/2023 CLINICAL DATA:  Fall, pain EXAM: DG HIP (WITH OR WITHOUT PELVIS) 2-3V RIGHT COMPARISON:  None Available. FINDINGS: Hip joints and SI joints symmetric. No acute bony abnormality. Specifically, no fracture, subluxation, or dislocation. IMPRESSION: No acute bony abnormality. Electronically  Signed   By: Janeece Mechanic M.D.   On: 10/20/2023 21:06   DG Lumbar Spine Complete Result Date: 10/20/2023 CLINICAL DATA:  Fall, pain EXAM: LUMBAR SPINE - COMPLETE 4+ VIEW COMPARISON:  06/26/2023 FINDINGS: Normal alignment. No fracture. Diffuse degenerative disc and facet disease. SI joints symmetric and unremarkable. InterStim device un changed. Diffuse aortic atherosclerosis. No aneurysm. IMPRESSION: No acute bony abnormality. Electronically Signed   By: Janeece Mechanic M.D.   On: 10/20/2023 21:05   DG Ankle Complete Right Result Date: 10/20/2023 CLINICAL DATA:  Fall, decreased mobility EXAM: RIGHT ANKLE - COMPLETE 3+ VIEW; RIGHT FOOT COMPLETE - 3+ VIEW COMPARISON:  09/12/2023 FINDINGS: Small osseous fragments adjacent to the posteromedial process of the talus are favored chronic but could represent a small avulsion fracture. Recommend correlation with site of pain. No dislocation. Degenerative change about the midfoot and first MTP joint. Plantar calcaneal spur. IMPRESSION: Small osseous fragments adjacent to the posteromedial process of the talus are favored chronic but could represent a small avulsion fracture. Recommend correlation with site of pain. Electronically Signed   By: Rozell Cornet M.D.   On: 10/20/2023 19:16   DG Foot Complete Right Result Date: 10/20/2023 CLINICAL DATA:  Fall, decreased mobility EXAM: RIGHT ANKLE - COMPLETE 3+ VIEW; RIGHT FOOT COMPLETE - 3+ VIEW COMPARISON:  09/12/2023 FINDINGS: Small osseous fragments adjacent to the posteromedial process of the talus are favored chronic but could represent a small avulsion fracture. Recommend correlation with site of pain. No dislocation. Degenerative change about the midfoot and first MTP joint. Plantar calcaneal spur. IMPRESSION: Small osseous fragments adjacent to the posteromedial process of the talus are favored chronic but could represent a small avulsion fracture. Recommend correlation with site of pain. Electronically Signed   By:  Rozell Cornet M.D.   On: 10/20/2023 19:16        Scheduled Meds:  acidophilus  2 capsule Oral Daily   atorvastatin  10 mg Oral Daily  diclofenac Sodium  2 g Topical QID   dronedarone   400 mg Oral BID   DULoxetine   60 mg Oral Daily   gabapentin   1,500 mg Oral QHS   gabapentin   300 mg Oral Daily   insulin  glargine-yfgn  15 Units Subcutaneous Daily   linagliptin  5 mg Oral Daily   metoprolol  succinate  25 mg Oral Daily   tamsulosin  0.4 mg Oral Daily   Continuous Infusions:  cefTRIAXone  (ROCEPHIN )  IV 1 g (10/21/23 1754)     LOS: 0 days    Time spent: 45 minutes spent on chart review, discussion with nursing staff, consultants, updating family and interview/physical exam; more than 50% of that time was spent in counseling and/or coordination of care.    Enrigue Harvard, DO [facility] Available via Epic secure chat 7am-7pm After these hours, please refer to coverage provider listed on [email or web address] 10/22/2023, 11:32 AM

## 2023-10-22 NOTE — Progress Notes (Signed)
   10/22/23 2031  BiPAP/CPAP/SIPAP  BiPAP/CPAP/SIPAP Pt Type Adult  BiPAP/CPAP/SIPAP Resmed  Mask Type Full face mask  Dentures removed? Not applicable  Mask Size Medium  EPAP 12 cmH2O (changed for pt comfort)  FiO2 (%) 21 %  Patient Home Machine No  Patient Home Mask No  Patient Home Tubing No  Auto Titrate No  Device Plugged into RED Power Outlet Yes

## 2023-10-23 DIAGNOSIS — R531 Weakness: Secondary | ICD-10-CM | POA: Diagnosis not present

## 2023-10-23 LAB — CBC
HCT: 42.7 % (ref 39.0–52.0)
Hemoglobin: 13.8 g/dL (ref 13.0–17.0)
MCH: 28.1 pg (ref 26.0–34.0)
MCHC: 32.3 g/dL (ref 30.0–36.0)
MCV: 87 fL (ref 80.0–100.0)
Platelets: 235 10*3/uL (ref 150–400)
RBC: 4.91 MIL/uL (ref 4.22–5.81)
RDW: 14 % (ref 11.5–15.5)
WBC: 11.7 10*3/uL — ABNORMAL HIGH (ref 4.0–10.5)
nRBC: 0 % (ref 0.0–0.2)

## 2023-10-23 LAB — BASIC METABOLIC PANEL WITH GFR
Anion gap: 10 (ref 5–15)
BUN: 16 mg/dL (ref 8–23)
CO2: 25 mmol/L (ref 22–32)
Calcium: 8.9 mg/dL (ref 8.9–10.3)
Chloride: 101 mmol/L (ref 98–111)
Creatinine, Ser: 0.63 mg/dL (ref 0.61–1.24)
GFR, Estimated: >60 mL/min (ref >60)
Glucose, Bld: 193 mg/dL — ABNORMAL HIGH (ref 70–99)
Potassium: 3 mmol/L — ABNORMAL LOW (ref 3.5–5.1)
Sodium: 136 mmol/L (ref 135–145)

## 2023-10-23 LAB — GLUCOSE, CAPILLARY
Glucose-Capillary: 214 mg/dL — ABNORMAL HIGH (ref 70–99)
Glucose-Capillary: 274 mg/dL — ABNORMAL HIGH (ref 70–99)

## 2023-10-23 LAB — URINE CULTURE

## 2023-10-23 MED ORDER — POTASSIUM CHLORIDE CRYS ER 20 MEQ PO TBCR: 40.0000 meq | EXTENDED_RELEASE_TABLET | Freq: Every day | ORAL | Status: DC

## 2023-10-23 MED ORDER — AMLODIPINE BESYLATE 10 MG PO TABS
10.0000 mg | ORAL_TABLET | Freq: Every day | ORAL | Status: DC
  Administered 2023-10-23: 10 mg via ORAL
  Filled 2023-10-23: qty 1

## 2023-10-23 MED ORDER — RISAQUAD PO CAPS
2.0000 | ORAL_CAPSULE | Freq: Every day | ORAL | Status: DC
Start: 2023-10-24 — End: 2023-12-13

## 2023-10-23 MED ORDER — SULFAMETHOXAZOLE-TRIMETHOPRIM 400-80 MG PO TABS: 1.0000 | ORAL_TABLET | Freq: Two times a day (BID) | ORAL | Status: DC

## 2023-10-23 MED ORDER — SULFAMETHOXAZOLE-TRIMETHOPRIM 400-80 MG PO TABS
1.0000 | ORAL_TABLET | Freq: Two times a day (BID) | ORAL | Status: DC
  Administered 2023-10-23: 1 via ORAL
  Filled 2023-10-23 (×2): qty 1

## 2023-10-23 MED ORDER — POTASSIUM CHLORIDE CRYS ER 20 MEQ PO TBCR
40.0000 meq | EXTENDED_RELEASE_TABLET | ORAL | Status: DC
  Administered 2023-10-23 (×2): 40 meq via ORAL
  Filled 2023-10-23 (×2): qty 2

## 2023-10-23 MED ORDER — INSULIN GLARGINE-YFGN 100 UNIT/ML ~~LOC~~ SOLN: 15.0000 [IU] | Freq: Every day | SUBCUTANEOUS | Status: DC

## 2023-10-23 NOTE — Progress Notes (Signed)
 PROGRESS NOTE    Alan Lin  ZOX:[id number] DOB: 1955-12-30 DOA: 10/20/2023 PCP: Peri Brackett, MD    Brief Narrative:   Alan Lin is a 68 y.o. male with medical history significant for type 2 diabetes mellitus, essential hypertension, paroxysmal atrial fibrillation not on chronic anticoagulation, who is admitted to [facility] on 10/20/2023 with generalized weakness after presenting from home to [facility] ED complaining of generalized weakness. Found to have a UTI.  Await culture and PT Eval since he is from ILF. Overall he is feeling much better.  Medically ready for SNF placement.   Assessment and Plan: Generalized weakness:   -prob from UTI- appears improved post treatment -PT eval- SNF   Ground-level mechanical falls:  -PT eval- SNF -from ILF    Sepsis due to acute cystitis:  -urinalysis that is associated with a hazy appearing specimen, significant pyuria, moderate leukocyte esterase, and no evidence of squamous epithelial cells to suggest a contaminated specimen, there is concern for acute cystitis in the setting of the patient's new onset generalized weakness over the last 3 to 4 days as well as new ground-level mechanical falls over the last 2 days.    - Culture reviewed: Will change and treat for total of 5 days with Bactrim   Dehydration:  -s/p IVF    Hypokalemia -replete again with daily meds  Subacute right ankle sprain: Patient reports recent diagnosis of right ankle sprain approximately 3 weeks ago.  He was seen in the ED on 09/09/2023 for this, with workup at that time that included venous ultrasound of the right lower extremity which showed no evidence of acute DVT.   -Continue  ASO right ankle raise -added voltaren gel     Type 2 Diabetes Mellitus: Refusing sliding scale insulin  while in the hospital - Added Tradjenta but sugar still elevated - Agreeable to once daily Lantus so started on 5/4 -resume home meds upon d/c but may need to stop jardiance  with UTI   Essential Hypertension: -resume home meds      Paroxysmal atrial fibrillation: Documented history of such. In setting of CHA2DS2-VASc score of  3, - Metoprolol  succinate.  Additionally, he is on Multaq  as an outpatient.  Most recent echocardiogram occurred in February 2024 was notable for LVEF 60 to 65%, normal left ventricular diastolic parameters and no evidence of significant valvular pathology.will check nonurgent EKG at this time, including for evaluation of QTc given use of Multaq  as an outpatient. -per chart review: Given frequent falls, agree with patient's hesitancy to start therapeutic anticoagulation.      Benign Prostatic Hyperplasia - continue home tamsulosin.    Obesity Estimated body mass index is 34.64 kg/m as calculated from the following:   Height as of this encounter: 5\' 10"  (1.778 m).   Weight as of this encounter: 109.5 kg.    DVT prophylaxis: SCDs Start: 10/20/23 2201    Code Status: Full Code   Disposition Plan:  Level of care: Med-Surg Status is: Observation Needs SNF placement  Consultants:    Subjective: Says he is going to leave today regardless of bed situation, he is tired of being here and does not have his Apple watch charger and cannot live without it  Objective: Vitals:   10/22/23 0508 10/22/23 1401 10/22/23 2013 10/23/23 0540  BP: (!) 148/75 (!) 154/71 (!) 140/68 (!) 159/68  Pulse: 97 (!) 102 95 96  Resp: 18 20 18 18   Temp: 98.9 F (37.2 C) (!) 97.5 F (  36.4 C) 99.6 F (37.6 C) 98.3 F (36.8 C)  TempSrc: Oral Oral Oral Oral  SpO2: 92% 93% 93% 93%  Weight: 109.5 kg     Height:        Intake/Output Summary (Last 24 hours) at 10/23/2023 1034 Last data filed at 10/22/2023 1700 Gross per 24 hour  Intake 340.41 ml  Output --  Net 340.41 ml   Filed Weights   10/20/23 2305 10/21/23 0500 10/22/23 0508  Weight: 110 kg 111.6 kg 109.5 kg    Examination:    General: Appearance:    Obese male in no acute distress      Lungs:    respirations unlabored  Heart:    Normal heart rate.    MS:   All extremities are intact.   Neurologic:   Awake, alert      Data Reviewed: I have personally reviewed following labs and imaging studies  CBC: Recent Labs  Lab 10/20/23 1809 10/21/23 0914 10/23/23 0521  WBC 15.8* 13.2* 11.7*  NEUTROABS 13.1* 10.4*  --   HGB 15.2 14.2 13.8  HCT 46.6 46.3 42.7  MCV 86.1 91.1 87.0  PLT 222 231 235   Basic Metabolic Panel: Recent Labs  Lab 10/20/23 1809 10/21/23 0914 10/23/23 0521  NA 135 133* 136  K 3.2* 3.1* 3.0*  CL 95* 98 101  CO2 25 22 25   GLUCOSE 225* 269* 193*  BUN 13 14 16   CREATININE 0.75 0.70 0.63  CALCIUM 9.5 9.0 8.9  MG 2.1 1.9  --    GFR: Estimated Creatinine Clearance: 111 mL/min (by C-G formula based on SCr of 0.63 mg/dL). Liver Function Tests: Recent Labs  Lab 10/20/23 1809 10/21/23 0914  AST 14* 10*  ALT 18 14  ALKPHOS 53 43  BILITOT 1.9* 1.7*  PROT 7.2 6.0*  ALBUMIN 4.0 3.2*   No results for input(s): "LIPASE", "AMYLASE" in the last 168 hours. No results for input(s): "AMMONIA" in the last 168 hours. Coagulation Profile: No results for input(s): "INR", "PROTIME" in the last 168 hours. Cardiac Enzymes: Recent Labs  Lab 10/20/23 1809  CKTOTAL 42*   BNP (last 3 results) No results for input(s): "PROBNP" in the last 8760 hours. HbA1C: Recent Labs    10/20/23 1809  HGBA1C 9.3*   CBG: Recent Labs  Lab 10/22/23 0725 10/22/23 1131 10/22/23 1611 10/22/23 2155 10/23/23 0757  GLUCAP 379* 239* 247* 257* 214*   Lipid Profile: No results for input(s): "CHOL", "HDL", "LDLCALC", "TRIG", "CHOLHDL", "LDLDIRECT" in the last 72 hours. Thyroid Function Tests: Recent Labs    10/20/23 2338  TSH 1.336   Anemia Panel: Recent Labs    10/20/23 2338  VITAMINB12 484   Sepsis Labs: Recent Labs  Lab 10/20/23 2244  LATICACIDVEN 1.3    Recent Results (from the past 240 hours)  Urine Culture     Status: Abnormal   Collection  Time: 10/20/23  6:21 PM   Specimen: Urine, Clean Catch  Result Value Ref Range Status   Specimen Description   Final    URINE, CLEAN CATCH Performed at [facility], [address], [place], [place] [address]    Special Requests   Final    NONE Performed at [facility], [address] [place], [place] [address]    Culture (A)  Final    20,000 COLONIES/mL STREPTOCOCCUS AGALACTIAE 70,000 COLONIES/mL STAPHYLOCOCCUS HAEMOLYTICUS TESTING AGAINST S. AGALACTIAE NOT ROUTINELY PERFORMED DUE TO PREDICTABILITY OF AMP/PEN/VAN SUSCEPTIBILITY. Performed at [facility],  [address], [place], [place] [address]    Report Status 10/23/2023 FINAL  Final   Organism ID, Bacteria STAPHYLOCOCCUS HAEMOLYTICUS (A)  Final      Susceptibility   Staphylococcus haemolyticus - MIC*    CIPROFLOXACIN  >=8 RESISTANT Resistant     GENTAMICIN <=0.5 SENSITIVE Sensitive     NITROFURANTOIN 32 SENSITIVE Sensitive     OXACILLIN 0.5 RESISTANT Resistant     TETRACYCLINE <=1 SENSITIVE Sensitive     VANCOMYCIN <=0.5 SENSITIVE Sensitive     TRIMETH/SULFA <=10 SENSITIVE Sensitive     RIFAMPIN <=0.5 SENSITIVE Sensitive     Inducible Clindamycin POSITIVE Resistant     * 70,000 COLONIES/mL STAPHYLOCOCCUS HAEMOLYTICUS  Culture, blood (Routine X 2) w Reflex to ID Panel     Status: None (Preliminary result)   Collection Time: 10/20/23 10:30 PM   Specimen: BLOOD  Result Value Ref Range Status   Specimen Description   Final    BLOOD RIGHT ANTECUBITAL Performed at [facility], [address] [place], [place] [address]    Special Requests   Final    BOTTLES DRAWN AEROBIC AND ANAEROBIC Blood Culture adequate volume Performed at [facility], [address], [place], [place] [address]    Culture   Final    NO GROWTH 2 DAYS Performed at [facility], [address], [place], [place] [address]    Report Status PENDING  Incomplete   Culture, blood (Routine X 2) w Reflex to ID Panel     Status: None (Preliminary result)   Collection Time: 10/20/23 11:38 PM   Specimen: BLOOD  Result Value Ref Range Status   Specimen Description   Final    BLOOD SITE NOT SPECIFIED Performed at [facility], [address], [place], [place] [address]    Special Requests   Final    BOTTLES DRAWN AEROBIC ONLY Blood Culture results may not be optimal due to an inadequate volume of blood received in culture bottles Performed at [facility], [address], [place], [place] [address]    Culture   Final    NO GROWTH 2 DAYS Performed at [facility], [address], [place], [place] [address]    Report Status PENDING  Incomplete         Radiology Studies: No results found.       Scheduled Meds:  acidophilus  2 capsule Oral Daily   amLODipine  10 mg Oral Daily   atorvastatin  10 mg Oral Daily   diclofenac Sodium  2 g Topical QID   dronedarone   400 mg Oral BID   DULoxetine   60 mg Oral Daily   gabapentin   1,500 mg Oral QHS   gabapentin   300 mg Oral Daily   insulin  glargine-yfgn  15 Units Subcutaneous Daily   linagliptin  5 mg Oral Daily   metoprolol  succinate  25 mg Oral Daily   potassium chloride   40 mEq Oral Q4H   [START ON 10/24/2023] potassium chloride   40 mEq Oral Daily   sulfamethoxazole-trimethoprim  1 tablet Oral Q12H   tamsulosin  0.4 mg Oral Daily   Continuous Infusions:     LOS: 0 days    Time spent: 45 minutes spent on chart review, discussion with nursing staff, consultants, updating family and interview/physical exam; more than 50% of that time was spent in counseling and/or coordination of care.    Enrigue Harvard, DO [facility] Available via Epic secure  chat 7am-7pm After these hours, please refer to coverage provider listed on [email or web address] 10/23/2023, 10:34 AM

## 2023-10-23 NOTE — Progress Notes (Signed)
 Patient Discharged to [facility] [place] [facility] Room number 115, Alert and oriented X4, transported by [facility].  Report Given to the receiving nurse- Mylinda Asa, LPN

## 2023-10-23 NOTE — TOC Transition Note (Signed)
 Transition of Care [facility]) - Discharge Note   Patient Details  Name: Alan Lin MRN: [id number] Date of Birth: [birth year]  Transition [facility] CM/SW Contact:  Loreda Rodriguez, RN Phone Number:[phone]  10/23/2023, 3:02 PM   Clinical Narrative:    CM received message that facility can accept patient today. D/c summary and transfer report have been faxed to [facility]. Transportation has been arranged via [facility]. Discharge packet is at nurses station. There are currently no other needs. TOC will sign off.      Barriers to Discharge: Continued Medical Work up   Patient Goals and CMS Choice Patient states their goals for this hospitalization and ongoing recovery are:: SNF to get stronger          Discharge Placement                       Discharge Plan and Services Additional resources added to the After Visit Summary for                                       Social Drivers of Health (SDOH) Interventions SDOH Screenings   Food Insecurity: No Food Insecurity (10/20/2023)  Housing: Low Risk  (10/21/2023)  Transportation Needs: No Transportation Needs (10/20/2023)  Utilities: Not At Risk (10/20/2023)  Financial Resource Strain: Low Risk  (08/29/2023)   Received from [facility]  Physical Activity: Insufficiently Active (08/29/2023)   Received from [facility]  Social Connections: Unknown (10/20/2023)  Tobacco Use: Low Risk  (10/20/2023)  Health Literacy: Low Risk  (08/29/2023)   Received from [facility]     Readmission Risk Interventions     No data to display

## 2023-10-23 NOTE — TOC Progression Note (Addendum)
 Transition of Care [facility]) - Progression Note    Patient Details  Name: Alan Lin MRN: [id number] Date of Birth: 1956-05-11  Transition of Care [facility]) CM/SW Contact  Loreda Rodriguez, RN Phone [phone]  10/23/2023, 11:17 AM  Clinical Narrative:    Cm following for SNF  placement. Cm spoke with [name] at [facility] to determine if facility can accept this patient. [name] states that she will get back with CM. Insurance auth initiated and approved. Auth ID [id number] approved for 5/5-5/7. [place] Awaiting confirmation that [facility] can accept.   1200 CM at bedside to discuss disposition plan. Patient states that he needs to go home and do a few things prior to going to SNF and that he would then go to facility. CM has explained to patient that he will need to be transported to facility. Patient questions nurse is this still a free country? CM explains discharge  process and has advised the patient that he will not be in his best interest to leave to go home to gather belonging and that patient may want to reconsider. CM has also advised patient that CM or [facility] would not be able to assist him. Patient then ask if discharge can happen today. CM has explained that there is a process and that CM is waiting to hear back from [facility] to determine if facility is able to accept patient today.    Expected Discharge Plan: Skilled Nursing Facility Barriers to Discharge: Continued Medical Work up  Expected Discharge Plan and Services       Living arrangements for the past 2 months: Independent Living Facility                                       Social Determinants of Health (SDOH) Interventions SDOH Screenings   Food Insecurity: No Food Insecurity (10/20/2023)  Housing: Low Risk  (10/21/2023)  Transportation Needs: No Transportation Needs (10/20/2023)  Utilities: Not At Risk (10/20/2023)  Financial Resource Strain: Low Risk  (08/29/2023)   Received  from [facility]  Physical Activity: Insufficiently Active (08/29/2023)   Received from [facility]  Social Connections: Unknown (10/20/2023)  Tobacco Use: Low Risk  (10/20/2023)  Health Literacy: Low Risk  (08/29/2023)   Received from [facility]    Readmission Risk Interventions     No data to display

## 2023-10-23 NOTE — Plan of Care (Signed)
  Problem: Education: Goal: Knowledge of General Education information will improve Description: Including pain rating scale, medication(s)/side effects and non-pharmacologic comfort measures Outcome: Progressing   Problem: Health Behavior/Discharge Planning: Goal: Ability to manage health-related needs will improve Outcome: Progressing   Problem: Clinical Measurements: Goal: Ability to maintain clinical measurements within normal limits will improve Outcome: Progressing Goal: Will remain free from infection Outcome: Progressing Goal: Diagnostic test results will improve Outcome: Progressing Goal: Respiratory complications will improve Outcome: Progressing Goal: Cardiovascular complication will be avoided Outcome: Progressing   Problem: Activity: Goal: Risk for activity intolerance will decrease Outcome: Progressing   Problem: Nutrition: Goal: Adequate nutrition will be maintained Outcome: Progressing   Problem: Elimination: Goal: Will not experience complications related to bowel motility Outcome: Progressing Goal: Will not experience complications related to urinary retention Outcome: Progressing   Problem: Safety: Goal: Ability to remain free from injury will improve Outcome: Progressing   Problem: Pain Managment: Goal: General experience of comfort will improve and/or be controlled Outcome: Progressing   Problem: Skin Integrity: Goal: Risk for impaired skin integrity will decrease Outcome: Progressing   Problem: Coping: Goal: Ability to adjust to condition or change in health will improve Outcome: Progressing   Problem: Metabolic: Goal: Ability to maintain appropriate glucose levels will improve Outcome: Progressing   Problem: Skin Integrity: Goal: Risk for impaired skin integrity will decrease Outcome: Progressing   Problem: Tissue Perfusion: Goal: Adequacy of tissue perfusion will improve Outcome: Progressing

## 2023-10-23 NOTE — Discharge Summary (Addendum)
 Physician Discharge Summary  Alan Lin MWU:[id number] DOB: 08/08/1955 DOA: 10/20/2023  PCP: Peri Brackett, MD  Admit date: 10/20/2023 Discharge date: 10/23/2023  Admitted From: ILF Discharge disposition: SNF   Recommendations for Outpatient Follow-Up:   Bactrim through 5/8 BMP 1 week Monitor blood sugars (has refused SSI in hospital)-- holding jardiance due to UTI  Discharge Diagnosis:   Principal Problem:   Generalized weakness Active Problems:   DM2 (diabetes mellitus, type 2) (HCC)   Hypokalemia   Essential hypertension   Falls, initial encounter   Acute cystitis   Sepsis (HCC)   Dehydration   Paroxysmal atrial fibrillation (HCC)   BPH (benign prostatic hyperplasia)    Discharge Condition: Improved.  Diet recommendation: Low sodium, heart healthy.  Carbohydrate-modified.    Wound care: None.  Code status: Full.   History of Present Illness:    Alan Lin is a 68 y.o. male with medical history significant for type 2 diabetes mellitus, essential hypertension, paroxysmal atrial fibrillation not on chronic anticoagulation, who is admitted to [facility] on 10/20/2023 with generalized weakness after presenting from home to [facility] ED complaining of generalized weakness.    The patient reports 3 to 4 days of generalized weakness in the absence of focal weakness.  He reports that, as a consequence of the generalized weakness, he has experienced 2-3 level falls over the last 2 days in which she tripped while attempting to ambulate at his independent living facility at [facility].  He reports that these falls have not been associated with any loss of consciousness, and he confirms that he has not hit his head as a result of these falls.  He reports that he was diagnosed with a right ankle sprain 3 weeks ago, at which time he was evaluated in the ED on 09/09/2023.  He underwent venous ultrasound of the right lower extremity at that time which was negative  for DVT.  He reports that his ankle has some residual tenderness, swelling, but emphasizes that these are not new findings as a consequence of the ground-level mechanical falls over the last 2 days.  Otherwise, he denies any associated acute arthralgias or myalgias.  No headache or any acute neck discomfort   Over the last 2 days, he conveys progressive difficulty with ambulating as a consequence of the generalized weakness.  After his most recent, will" fall, he conveys that he was unable to extricate himself off the floor due to this generalized weakness.  He was able to contact EMS who were able to assist him off of the floor, noting very limited duration of time on the floor prior to EMSs arrival.   Denies any associated subjective fever, chills, rigors, or generalized myalgias.  No recent dysuria or gross hematuria.  He denies any recent cough, shortness of breath, abdominal discomfort, diarrhea.  He also denies any recent chest pain, palpitations, diaphoresis, nausea, vomiting, dizziness, or presyncope.   His medical history is notable for paroxysmal atrial fibrillation, with most recent echocardiogram in February 2024, which showed LVEF 66 5%, normal left ventricular diastolic parameters, normal right ventricular systolic function and no evidence of significant valvular pathology.  In the setting of a history of paroxysmal atrial fibrillation, it appears that he was previously prescribed Eliquis , but the patient conveys he has never been on Eliquis , and is not currently on any blood thinners, including no formal anticoagulation.  Additionally, not on any aspirin at home.   Hospital Course by Problem:  Generalized weakness:   -prob from UTI- appears improved post treatment -PT eval- SNF    Ground-level mechanical falls:  -PT eval- SNF -from ILF    Sepsis due to acute cystitis:  -urinalysis that is associated with a hazy appearing specimen, significant pyuria, moderate leukocyte esterase,  and no evidence of squamous epithelial cells to suggest a contaminated specimen, there is concern for acute cystitis in the setting of the patient's new onset generalized weakness over the last 3 to 4 days as well as new ground-level mechanical falls over the last 2 days.    - Culture reviewed: Will change and treat for total of 5 days with Bactrim   Dehydration:  -s/p IVF    Hypokalemia -replete again with daily meds   Subacute right ankle sprain: Patient reports recent diagnosis of right ankle sprain approximately 3 weeks ago.  He was seen in the ED on 09/09/2023 for this, with workup at that time that included venous ultrasound of the right lower extremity which showed no evidence of acute DVT.   -Continue  ASO right ankle raise -added voltaren gel     Type 2 Diabetes Mellitus: Refusing sliding scale insulin  while in the hospital - Added Tradjenta but sugar still elevated - Agreeable to once daily Lantus so started on 5/4 -resume home meds upon d/c but may need to stop jardiance with UTI   Essential Hypertension: -resume home meds      Paroxysmal atrial fibrillation: Documented history of such. In setting of CHA2DS2-VASc score of  3, - Metoprolol  succinate.  Additionally, he is on Multaq  as an outpatient.  Most recent echocardiogram occurred in February 2024 was notable for LVEF 60 to 65%, normal left ventricular diastolic parameters and no evidence of significant valvular pathology.will check nonurgent EKG at this time, including for evaluation of QTc given use of Multaq  as an outpatient. -per chart review: Given frequent falls, agree with patient's hesitancy to start therapeutic anticoagulation.      Benign Prostatic Hyperplasia - continue home tamsulosin.    Obesity Estimated body mass index is 34.64 kg/m as calculated from the following:   Height as of this encounter: 5\' 10"  (1.778 m).   Weight as of this encounter: 109.5 kg.         Medical Consultants:       Discharge Exam:   Vitals:   10/23/23 0540 10/23/23 1037  BP: (!) 159/68 119/63  Pulse: 96 91  Resp: 18   Temp: 98.3 F (36.8 C)   SpO2: 93%    Vitals:   10/22/23 1401 10/22/23 2013 10/23/23 0540 10/23/23 1037  BP: (!) 154/71 (!) 140/68 (!) 159/68 119/63  Pulse: (!) 102 95 96 91  Resp: 20 18 18    Temp: (!) 97.5 F (36.4 C) 99.6 F (37.6 C) 98.3 F (36.8 C)   TempSrc: Oral Oral Oral   SpO2: 93% 93% 93%   Weight:      Height:        General exam: Appears calm and comfortable..    The results of significant diagnostics from this hospitalization (including imaging, microbiology, ancillary and laboratory) are listed below for reference.     Procedures and Diagnostic Studies:   DG Hip Unilat W or Wo Pelvis 2-3 Views Right Result Date: 10/20/2023 CLINICAL DATA:  Fall, pain EXAM: DG HIP (WITH OR WITHOUT PELVIS) 2-3V RIGHT COMPARISON:  None Available. FINDINGS: Hip joints and SI joints symmetric. No acute bony abnormality. Specifically, no fracture, subluxation, or dislocation. IMPRESSION: No acute  bony abnormality. Electronically Signed   By: Janeece Mechanic M.D.   On: 10/20/2023 21:06   DG Lumbar Spine Complete Result Date: 10/20/2023 CLINICAL DATA:  Fall, pain EXAM: LUMBAR SPINE - COMPLETE 4+ VIEW COMPARISON:  06/26/2023 FINDINGS: Normal alignment. No fracture. Diffuse degenerative disc and facet disease. SI joints symmetric and unremarkable. InterStim device un changed. Diffuse aortic atherosclerosis. No aneurysm. IMPRESSION: No acute bony abnormality. Electronically Signed   By: Janeece Mechanic M.D.   On: 10/20/2023 21:05   DG Ankle Complete Right Result Date: 10/20/2023 CLINICAL DATA:  Fall, decreased mobility EXAM: RIGHT ANKLE - COMPLETE 3+ VIEW; RIGHT FOOT COMPLETE - 3+ VIEW COMPARISON:  09/12/2023 FINDINGS: Small osseous fragments adjacent to the posteromedial process of the talus are favored chronic but could represent a small avulsion fracture. Recommend correlation with  site of pain. No dislocation. Degenerative change about the midfoot and first MTP joint. Plantar calcaneal spur. IMPRESSION: Small osseous fragments adjacent to the posteromedial process of the talus are favored chronic but could represent a small avulsion fracture. Recommend correlation with site of pain. Electronically Signed   By: Rozell Cornet M.D.   On: 10/20/2023 19:16   DG Foot Complete Right Result Date: 10/20/2023 CLINICAL DATA:  Fall, decreased mobility EXAM: RIGHT ANKLE - COMPLETE 3+ VIEW; RIGHT FOOT COMPLETE - 3+ VIEW COMPARISON:  09/12/2023 FINDINGS: Small osseous fragments adjacent to the posteromedial process of the talus are favored chronic but could represent a small avulsion fracture. Recommend correlation with site of pain. No dislocation. Degenerative change about the midfoot and first MTP joint. Plantar calcaneal spur. IMPRESSION: Small osseous fragments adjacent to the posteromedial process of the talus are favored chronic but could represent a small avulsion fracture. Recommend correlation with site of pain. Electronically Signed   By: Rozell Cornet M.D.   On: 10/20/2023 19:16     Labs:   Basic Metabolic Panel: Recent Labs  Lab 10/20/23 1809 10/21/23 0914 10/23/23 0521  NA 135 133* 136  K 3.2* 3.1* 3.0*  CL 95* 98 101  CO2 25 22 25   GLUCOSE 225* 269* 193*  BUN 13 14 16   CREATININE 0.75 0.70 0.63  CALCIUM 9.5 9.0 8.9  MG 2.1 1.9  --    GFR Estimated Creatinine Clearance: 111 mL/min (by C-G formula based on SCr of 0.63 mg/dL). Liver Function Tests: Recent Labs  Lab 10/20/23 1809 10/21/23 0914  AST 14* 10*  ALT 18 14  ALKPHOS 53 43  BILITOT 1.9* 1.7*  PROT 7.2 6.0*  ALBUMIN 4.0 3.2*   No results for input(s): "LIPASE", "AMYLASE" in the last 168 hours. No results for input(s): "AMMONIA" in the last 168 hours. Coagulation profile No results for input(s): "INR", "PROTIME" in the last 168 hours.  CBC: Recent Labs  Lab 10/20/23 1809 10/21/23 0914  10/23/23 0521  WBC 15.8* 13.2* 11.7*  NEUTROABS 13.1* 10.4*  --   HGB 15.2 14.2 13.8  HCT 46.6 46.3 42.7  MCV 86.1 91.1 87.0  PLT 222 231 235   Cardiac Enzymes: Recent Labs  Lab 10/20/23 1809  CKTOTAL 42*   BNP: Invalid input(s): "POCBNP" CBG: Recent Labs  Lab 10/22/23 1131 10/22/23 1611 10/22/23 2155 10/23/23 0757 10/23/23 1201  GLUCAP 239* 247* 257* 214* 274*   D-Dimer No results for input(s): "DDIMER" in the last 72 hours. Hgb A1c Recent Labs    10/20/23 1809  HGBA1C 9.3*   Lipid Profile No results for input(s): "CHOL", "HDL", "LDLCALC", "TRIG", "CHOLHDL", "LDLDIRECT" in the last 72 hours.  Thyroid function studies Recent Labs    10/20/23 2338  TSH 1.336   Anemia work up Recent Labs    10/20/23 2338  VITAMINB12 484   Microbiology Recent Results (from the past 240 hours)  Urine Culture     Status: Abnormal   Collection Time: 10/20/23  6:21 PM   Specimen: Urine, Clean Catch  Result Value Ref Range Status   Specimen Description   Final    URINE, CLEAN CATCH Performed at [facility], [address], [place], [place] [address]    Special Requests   Final    NONE Performed at [facility], [address], [place], [place] [address]    Culture (A)  Final    20,000 COLONIES/mL STREPTOCOCCUS AGALACTIAE 70,000 COLONIES/mL STAPHYLOCOCCUS HAEMOLYTICUS TESTING AGAINST S. AGALACTIAE NOT ROUTINELY PERFORMED DUE TO PREDICTABILITY OF AMP/PEN/VAN SUSCEPTIBILITY. Performed at [facility], [address], [place], [place] [address]    Report Status 10/23/2023 FINAL  Final   Organism ID, Bacteria STAPHYLOCOCCUS HAEMOLYTICUS (A)  Final      Susceptibility   Staphylococcus haemolyticus - MIC*    CIPROFLOXACIN  >=8 RESISTANT Resistant     GENTAMICIN <=0.5 SENSITIVE Sensitive     NITROFURANTOIN 32 SENSITIVE Sensitive     OXACILLIN 0.5 RESISTANT Resistant     TETRACYCLINE <=1 SENSITIVE Sensitive     VANCOMYCIN <=0.5  SENSITIVE Sensitive     TRIMETH/SULFA <=10 SENSITIVE Sensitive     RIFAMPIN <=0.5 SENSITIVE Sensitive     Inducible Clindamycin POSITIVE Resistant     * 70,000 COLONIES/mL STAPHYLOCOCCUS HAEMOLYTICUS  Culture, blood (Routine X 2) w Reflex to ID Panel     Status: None (Preliminary result)   Collection Time: 10/20/23 10:30 PM   Specimen: BLOOD  Result Value Ref Range Status   Specimen Description   Final    BLOOD RIGHT ANTECUBITAL Performed at [facility], [address] [place], [place] [address]    Special Requests   Final    BOTTLES DRAWN AEROBIC AND ANAEROBIC Blood Culture adequate volume Performed at [facility], [address], [place], [place] [address]    Culture   Final    NO GROWTH 2 DAYS Performed at [facility], [address], [place], [place] [address]    Report Status PENDING  Incomplete  Culture, blood (Routine X 2) w Reflex to ID Panel     Status: None (Preliminary result)   Collection Time: 10/20/23 11:38 PM   Specimen: BLOOD  Result Value Ref Range Status   Specimen Description   Final    BLOOD SITE NOT SPECIFIED Performed at [facility], [address] [place], [place] [address]    Special Requests   Final    BOTTLES DRAWN AEROBIC ONLY Blood Culture results may not be optimal due to an inadequate volume of blood received in culture bottles Performed at [facility], [address] [place], [place] [address]    Culture   Final    NO GROWTH 2 DAYS Performed at [facility], [address] [place], [place] [address]    Report Status PENDING  Incomplete     Discharge Instructions:   Discharge Instructions     Diet - low sodium heart healthy   Complete by: As directed    Diet Carb Modified   Complete by: As directed    Increase activity slowly   Complete by: As directed  Allergies as of 10/23/2023       Reactions   Metformin Diarrhea, Nausea And  Vomiting   Oxycodone Other (See Comments)   Constipation   Oxycodone-acetaminophen     Constipation   Lisinopril Cough   Morphine Itching   Other reaction(s): Other (See Comments)  Constipation   Morphine And Codeine Itching        Medication List     PAUSE taking these medications    chlorthalidone 25 MG tablet Wait to take this until your doctor or other care provider tells you to start again. Commonly known as: HYGROTON Take 25 mg by mouth daily.   Jardiance 10 MG Tabs tablet Wait to take this until your doctor or other care provider tells you to start again. Generic drug: empagliflozin Take 10 mg by mouth daily.       STOP taking these medications    apixaban  5 MG Tabs tablet Commonly known as: ELIQUIS    cyclobenzaprine 10 MG tablet Commonly known as: FLEXERIL   predniSONE  10 MG tablet Commonly known as: DELTASONE    simvastatin  20 MG tablet Commonly known as: ZOCOR        TAKE these medications    acetaminophen  325 MG tablet Commonly known as: TYLENOL  Take 1 tablet (325 mg total) by mouth every 6 (six) hours as needed for mild pain (or Fever >/= 101).   acidophilus Caps capsule Take 2 capsules by mouth daily. Start taking on: Oct 24, 2023   amLODipine 10 MG tablet Commonly known as: NORVASC Take 10 mg by mouth daily.   atorvastatin 10 MG tablet Commonly known as: LIPITOR Take 1 tablet by mouth daily.   bacitracin ointment Apply 1 Application topically as needed for wound care.   diclofenac Sodium 1 % Gel Commonly known as: VOLTAREN Apply 2 g topically 4 (four) times daily as needed (pain).   DSS 100 MG Caps Take 1 capsule by mouth daily.   DULoxetine  60 MG capsule Commonly known as: CYMBALTA  Take 1 capsule by mouth daily. What changed: Another medication with the same name was removed. Continue taking this medication, and follow the directions you see here.   gabapentin  300 MG capsule Commonly known as: NEURONTIN  Take 300-1,500 mg  by mouth daily. Take 300mg  (1 capsule) by mouth in the mornings and 1500mg  (5 capsules) in the evening.   GaviLAX 17 GM/SCOOP powder Generic drug: polyethylene glycol powder Take 17 g by mouth daily as needed for mild constipation or moderate constipation.   insulin  glargine-yfgn 100 UNIT/ML injection Commonly known as: SEMGLEE Inject 0.15 mLs (15 Units total) into the skin daily. Start taking on: Oct 24, 2023   losartan 50 MG tablet Commonly known as: COZAAR Take 50 mg by mouth daily.   methocarbamol  500 MG tablet Commonly known as: ROBAXIN  Take 500 mg by mouth as needed for muscle spasms.   metoprolol  succinate 25 MG 24 hr tablet Commonly known as: TOPROL -XL Take 1 tablet by mouth daily. What changed: Another medication with the same name was removed. Continue taking this medication, and follow the directions you see here.   Multaq  400 MG tablet Generic drug: dronedarone  Take 400 mg by mouth 2 (two) times daily.   multivitamin with minerals tablet Take 1 tablet by mouth daily.   potassium chloride  SA 20 MEQ tablet Commonly known as: KLOR-CON  M Take 2 tablets (40 mEq total) by mouth daily. Start taking on: Oct 24, 2023   Rybelsus 7 MG Tabs Generic drug: Semaglutide Take by mouth.   sulfamethoxazole-trimethoprim  400-80 MG tablet Commonly known as: BACTRIM Take 1 tablet by mouth every 12 (twelve) hours.   tamsulosin 0.4 MG Caps capsule Commonly known as: FLOMAX Take 0.4 mg by mouth daily.   traZODone 50 MG tablet Commonly known as: DESYREL Take 50 mg by mouth at bedtime.        Contact information for after-discharge care     Destination     [facility] Preferred SNF .   Service: Skilled Nursing Contact information: [address] [place]  [phone] [phone]                      Time coordinating discharge: 45 min  Signed:  Enrigue Harvard DO  [facility] 10/23/2023, 2:45 PM

## 2023-10-26 LAB — CULTURE, BLOOD (ROUTINE X 2)
Culture: NO GROWTH
Culture: NO GROWTH
Special Requests: ADEQUATE

## 2023-11-20 ENCOUNTER — Emergency Department (HOSPITAL_COMMUNITY)

## 2023-11-20 ENCOUNTER — Encounter (HOSPITAL_COMMUNITY): Payer: Self-pay

## 2023-11-20 ENCOUNTER — Emergency Department (HOSPITAL_COMMUNITY)
Admission: EM | Admit: 2023-11-20 | Discharge: 2023-11-21 | Disposition: A | Discharge status: Home / Self Care | Attending: Emergency Medicine | Admitting: Emergency Medicine

## 2023-11-20 ENCOUNTER — Other Ambulatory Visit: Payer: Self-pay

## 2023-11-20 DIAGNOSIS — Z794 Long term (current) use of insulin: Secondary | ICD-10-CM | POA: Insufficient documentation

## 2023-11-20 DIAGNOSIS — R531 Weakness: Secondary | ICD-10-CM | POA: Insufficient documentation

## 2023-11-20 DIAGNOSIS — M47817 Spondylosis without myelopathy or radiculopathy, lumbosacral region: Secondary | ICD-10-CM | POA: Diagnosis not present

## 2023-11-20 DIAGNOSIS — M160 Bilateral primary osteoarthritis of hip: Secondary | ICD-10-CM | POA: Diagnosis not present

## 2023-11-20 DIAGNOSIS — I6523 Occlusion and stenosis of bilateral carotid arteries: Secondary | ICD-10-CM | POA: Diagnosis not present

## 2023-11-20 DIAGNOSIS — M7918 Myalgia, other site: Secondary | ICD-10-CM

## 2023-11-20 DIAGNOSIS — M25551 Pain in right hip: Secondary | ICD-10-CM | POA: Diagnosis present

## 2023-11-20 LAB — COMPREHENSIVE METABOLIC PANEL WITH GFR
ALT: 11 U/L (ref 0–44)
AST: 14 U/L — ABNORMAL LOW (ref 15–41)
Albumin: 3.1 g/dL — ABNORMAL LOW (ref 3.5–5.0)
Alkaline Phosphatase: 59 U/L (ref 38–126)
Anion gap: 11 (ref 5–15)
BUN: 12 mg/dL (ref 8–23)
CO2: 23 mmol/L (ref 22–32)
Calcium: 9.5 mg/dL (ref 8.9–10.3)
Chloride: 99 mmol/L (ref 98–111)
Creatinine, Ser: 0.7 mg/dL (ref 0.61–1.24)
GFR, Estimated: >60 mL/min (ref >60)
Glucose, Bld: 138 mg/dL — ABNORMAL HIGH (ref 70–99)
Potassium: 4 mmol/L (ref 3.5–5.1)
Sodium: 133 mmol/L — ABNORMAL LOW (ref 135–145)
Total Bilirubin: 1 mg/dL (ref 0.0–1.2)
Total Protein: 6.1 g/dL — ABNORMAL LOW (ref 6.5–8.1)

## 2023-11-20 LAB — CBC
HCT: 43 % (ref 39.0–52.0)
Hemoglobin: 14.3 g/dL (ref 13.0–17.0)
MCH: 27.5 pg (ref 26.0–34.0)
MCHC: 33.3 g/dL (ref 30.0–36.0)
MCV: 82.7 fL (ref 80.0–100.0)
Platelets: 282 10*3/uL (ref 150–400)
RBC: 5.2 MIL/uL (ref 4.22–5.81)
RDW: 13.6 % (ref 11.5–15.5)
WBC: 12 10*3/uL — ABNORMAL HIGH (ref 4.0–10.5)
nRBC: 0 % (ref 0.0–0.2)

## 2023-11-20 NOTE — ED Notes (Signed)
Pt back in triage from CT.

## 2023-11-20 NOTE — ED Triage Notes (Signed)
 Pt BIB [facility] d/t PT reporting that he is getting more weak & could not stand during their session with him. 134/64, resp 16, CBG 144, 12L unremarkable, 20g Lt FA, 95% on 4L O2 via n/c.

## 2023-11-20 NOTE — ED Provider Triage Note (Signed)
 Emergency Medicine Provider Triage Evaluation Note  CAESON FILIPPI , a 68 y.o. male  was evaluated in triage.  Pt complains of weakness, could not stand during PT.  In [facility] due to 2 falls in 1 day.  Has chronic pain secondary to previous back injuries.  Denies any chest pain, shortness of breath.  Reports that he is having difficulty lifting his right leg.  Denies any nausea, vomiting.  Review of Systems  Positive: weakness Negative:   Physical Exam  BP 112/61 (BP Location: Right Arm)   Pulse 87   Temp 98.7 F (37.1 C)   Resp 18   Ht 5\' 10"  (1.778 m)   Wt 108.9 kg   SpO2 97%   BMI 34.44 kg/m  Gen:   Awake, no distress   Resp:  Normal effort  MSK:   Moves extremities without difficulty  Other:  Decreased strength of right lower extremity, normal sensation throughout  Medical Decision Making  Medically screening exam initiated at 6:08 PM.  Appropriate orders placed.  Jolee Naval was informed that the remainder of the evaluation will be completed by another provider, this initial triage assessment does not replace that evaluation, and the importance of remaining in the ED until their evaluation is complete.  Workup initiated in triage    Nelly Banco, [place] 11/20/23 1810

## 2023-11-20 NOTE — ED Notes (Signed)
Pt transported to CT from triage

## 2023-11-21 ENCOUNTER — Emergency Department (HOSPITAL_COMMUNITY)

## 2023-11-21 MED ORDER — MELOXICAM 7.5 MG PO TABS: 7.5000 mg | ORAL_TABLET | Freq: Every day | ORAL | 0 refills | Status: DC

## 2023-11-21 MED ORDER — ACETAMINOPHEN 500 MG PO TABS
1000.0000 mg | ORAL_TABLET | Freq: Once | ORAL | Status: AC
  Administered 2023-11-21: 1000 mg via ORAL
  Filled 2023-11-21: qty 2

## 2023-11-21 NOTE — ED Notes (Signed)
 PTAR at bedside. Attempt to call [facility] x2 without success

## 2023-11-21 NOTE — ED Notes (Signed)
 Pt is resting, no distress, toileting offered, drink at the bedside

## 2023-11-21 NOTE — ED Provider Notes (Signed)
 [place] EMERGENCY DEPARTMENT AT [facility] Provider Note   CSN: [id number] Arrival date & time: 11/20/23  1724     History  Chief Complaint  Patient presents with   Weakness    Alan Lin is a 68 y.o. male.  68 yo M who comes in for weakness while trying to do PT. Patient states it is actually right gluteal pain that happens whenever he stands up to try to walk. He is mostly bed/wc bound and does PT daily. He couldn't do it so they sent him here for eval. It was noted he was weak in his RLE earlier in his ER visit however he states it's not weak it just hurts to move it.    Weakness      Home Medications Prior to Admission medications   Medication Sig Start Date End Date Taking? Authorizing Provider  meloxicam (MOBIC) 7.5 MG tablet Take 1 tablet (7.5 mg total) by mouth daily. 11/21/23  Yes Jaishawn Witzke, Reymundo Caulk, MD  acetaminophen  (TYLENOL ) 325 MG tablet Take 1 tablet (325 mg total) by mouth every 6 (six) hours as needed for mild pain (or Fever >/= 101). 07/28/22   Elgergawy, Ardia Kraft, MD  acidophilus (RISAQUAD) CAPS capsule Take 2 capsules by mouth daily. 10/24/23   Vann, Jessica U, DO  amLODipine  (NORVASC ) 10 MG tablet Take 10 mg by mouth daily. 09/10/22   [provider]  atorvastatin  (LIPITOR) 10 MG tablet Take 1 tablet by mouth daily. 10/12/23 10/06/24  [provider]  bacitracin ointment Apply 1 Application topically as needed for wound care. 07/04/23 07/03/24  [provider]  chlorthalidone (HYGROTON) 25 MG tablet Take 25 mg by mouth daily. 08/13/22   [provider]  Docusate Sodium  (DSS) 100 MG CAPS Take 1 capsule by mouth daily. 10/12/23 10/11/24  [provider]  DULoxetine  (CYMBALTA ) 60 MG capsule Take 1 capsule by mouth daily. 10/12/23   [provider]  gabapentin  (NEURONTIN ) 300 MG capsule Take 300-1,500 mg by mouth daily. Take 300mg  (1 capsule) by mouth in the mornings and 1500mg  (5 capsules) in the evening.  08/03/16   [provider]  GAVILAX 17 GM/SCOOP powder Take 17 g by mouth daily as needed for mild constipation or moderate constipation. 10/01/23   [provider]  insulin  glargine-yfgn (SEMGLEE ) 100 UNIT/ML injection Inject 0.15 mLs (15 Units total) into the skin daily. 10/24/23   Vann, Jessica U, DO  JARDIANCE 10 MG TABS tablet Take 10 mg by mouth daily. 08/09/22   [provider]  losartan (COZAAR) 50 MG tablet Take 50 mg by mouth daily. 07/14/16   [provider]  methocarbamol  (ROBAXIN ) 500 MG tablet Take 500 mg by mouth as needed for muscle spasms. 10/12/23 10/01/24  [provider]  metoprolol  succinate (TOPROL -XL) 25 MG 24 hr tablet Take 1 tablet by mouth daily. 10/12/23   [provider]  MULTAQ  400 MG tablet Take 400 mg by mouth 2 (two) times daily. 10/17/23   [provider]  Multiple Vitamins-Minerals (MULTIVITAMIN WITH MINERALS) tablet Take 1 tablet by mouth daily.    [provider]  potassium chloride  SA (KLOR-CON  M) 20 MEQ tablet Take 2 tablets (40 mEq total) by mouth daily. 10/24/23   Vann, Jessica U, DO  Semaglutide (RYBELSUS) 7 MG TABS Take by mouth.    [provider]  sulfamethoxazole -trimethoprim  (BACTRIM ) 400-80 MG tablet Take 1 tablet by mouth every 12 (twelve) hours. 10/23/23   Vann, Jessica U, DO  tamsulosin  (FLOMAX )  0.4 MG CAPS capsule Take 0.4 mg by mouth daily. 09/28/22   [provider]  traZODone (DESYREL) 50 MG tablet Take 50 mg by mouth at bedtime. 10/12/23 08/28/24  [provider]      Allergies    Metformin, Oxycodone, Oxycodone-acetaminophen , Lisinopril, Morphine, and Morphine and codeine    Review of Systems   Review of Systems  Neurological:  Positive for weakness.    Physical Exam Updated Vital Signs BP 125/84   Pulse 89   Temp 98.1 F (36.7 C)   Resp 18   Ht 5\' 10"  (1.778 m)   Wt 108.9 kg   SpO2 100%   BMI 34.44 kg/m  Physical Exam Vitals and nursing note  reviewed.  Constitutional:      Appearance: He is well-developed.  HENT:     Head: Normocephalic and atraumatic.  Eyes:     Pupils: Pupils are equal, round, and reactive to light.  Cardiovascular:     Rate and Rhythm: Normal rate.  Pulmonary:     Effort: Pulmonary effort is normal. No respiratory distress.  Abdominal:     General: There is no distension.  Musculoskeletal:        General: Normal range of motion.     Cervical back: Normal range of motion.     Comments: Ttp to right gluteal region Can't walk secondary to discomfort  Neurological:     General: No focal deficit present.     Mental Status: He is alert.     ED Results / Procedures / Treatments   Labs (all labs ordered are listed, but only abnormal results are displayed) Labs Reviewed  CBC - Abnormal; Notable for the following components:      Result Value   WBC 12.0 (*)    All other components within normal limits  COMPREHENSIVE METABOLIC PANEL WITH GFR - Abnormal; Notable for the following components:   Sodium 133 (*)    Glucose, Bld 138 (*)    Total Protein 6.1 (*)    Albumin 3.1 (*)    AST 14 (*)    All other components within normal limits  URINALYSIS, ROUTINE W REFLEX MICROSCOPIC    EKG EKG Interpretation Date/Time:  Monday November 20 2023 18:20:35 EDT Ventricular Rate:  86 PR Interval:  190 QRS Duration:  94 QT Interval:  368 QTC Calculation: 440 R Axis:   63  Text Interpretation: Normal sinus rhythm Normal ECG When compared with ECG of 20-Nov-2023 17:57, PREVIOUS ECG IS PRESENT Confirmed by Eve Hinders [phone]) on 11/21/2023 1:26:08 AM  Radiology CT PELVIS WO CONTRAST Result Date: 11/21/2023 CLINICAL DATA:  Fall, right hip pain EXAM: CT PELVIS WITHOUT CONTRAST TECHNIQUE: Multidetector CT imaging of the pelvis was performed following the standard protocol without intravenous contrast. RADIATION DOSE REDUCTION: This exam was performed according to the departmental dose-optimization program which  includes automated exposure control, adjustment of the mA and/or kV according to patient size and/or use of iterative reconstruction technique. COMPARISON:  None Available. FINDINGS: Urinary Tract:  No abnormality visualized. Bowel:  Unremarkable visualized pelvic bowel loops.  No free fluid Vascular/Lymphatic: Moderate aortoiliac atherosclerotic calcification. No pathologic adenopathy within the abdomen and pelvis. Reproductive:  Prostate gland seminal vesicles are unremarkable. Other:  No abdominal wall hernia. Musculoskeletal: Normal alignment. No acute fracture or dislocation. Mild bilateral degenerative hip arthritis. Mild to moderate facet arthrosis L5-S1, right greater than left. Left sacral plexus neurostimulator is in place with the battery within the right gluteal subcutaneous soft tissues. IMPRESSION: 1.  No acute fracture or dislocation. 2. Mild bilateral degenerative hip arthritis. 3. Mild to moderate facet arthrosis L5-S1, right greater than left. Electronically Signed   By: Worthy Heads M.D.   On: 11/21/2023 03:56   CT Head Wo Contrast Result Date: 11/20/2023 CLINICAL DATA:  Neuro deficit, acute, stroke suspected EXAM: CT HEAD WITHOUT CONTRAST TECHNIQUE: Contiguous axial images were obtained from the base of the skull through the vertex without intravenous contrast. RADIATION DOSE REDUCTION: This exam was performed according to the departmental dose-optimization program which includes automated exposure control, adjustment of the mA and/or kV according to patient size and/or use of iterative reconstruction technique. COMPARISON:  CT head 09/09/2023. FINDINGS: Brain: Cerebral ventricle sizes are concordant with the degree of cerebral volume loss. No evidence of large-territorial acute infarction. No parenchymal hemorrhage. No mass lesion. No extra-axial collection. No mass effect or midline shift. No hydrocephalus. Basilar cisterns are patent. Vascular: No hyperdense vessel. Atherosclerotic  calcifications are present within the cavernous internal carotid arteries. Skull: No acute fracture or focal lesion. Sinuses/Orbits: Paranasal sinuses and mastoid air cells are clear. The orbits are unremarkable. Other: None. IMPRESSION: No acute intracranial abnormality. Electronically Signed   By: Morgane  Naveau M.D.   On: 11/20/2023 19:21    Procedures Procedures    Medications Ordered in ED Medications  acetaminophen  (TYLENOL ) tablet 1,000 mg (1,000 mg Oral Given 11/21/23 0449)    ED Course/ Medical Decision Making/ A&P                                 Medical Decision Making Amount and/or Complexity of Data Reviewed Radiology: ordered.  Risk OTC drugs. Prescription drug management.   Ct head ok, I think his 'weakness' is more related to discomfort. Ct without fracture or obvious causes. Will try conservative treatment with pcp follow up. Continue PT as able at home.    Final Clinical Impression(s) / ED Diagnoses Final diagnoses:  Gluteal pain    Rx / DC Orders ED Discharge Orders          Ordered    meloxicam (MOBIC) 7.5 MG tablet  Daily        11/21/23 0540              Ellamay Fors, MD 11/21/23 [phone]

## 2023-11-21 NOTE — ED Notes (Signed)
 Pt unable to stand and pivot to bed at this time. Pt reports pain to back and continued weakness.

## 2023-11-21 NOTE — ED Notes (Signed)
 Patient can't stand by himself to walk patient states that his hip hurts when he stands or try to walk.

## 2023-11-21 NOTE — ED Notes (Signed)
 Third call to [facility], operator answered and transferred me to nursing staff, no answer x2.  Report given to [facility]

## 2023-11-30 DIAGNOSIS — E1140 Type 2 diabetes mellitus with diabetic neuropathy, unspecified: Secondary | ICD-10-CM | POA: Diagnosis present

## 2023-12-13 ENCOUNTER — Emergency Department (HOSPITAL_COMMUNITY)

## 2023-12-13 ENCOUNTER — Emergency Department (HOSPITAL_COMMUNITY)
Admission: EM | Admit: 2023-12-13 | Discharge: 2023-12-13 | Discharge status: Against Medical Advice | Attending: Emergency Medicine | Admitting: Emergency Medicine

## 2023-12-13 ENCOUNTER — Encounter (HOSPITAL_COMMUNITY): Payer: Self-pay | Admitting: Emergency Medicine

## 2023-12-13 DIAGNOSIS — R109 Unspecified abdominal pain: Secondary | ICD-10-CM | POA: Diagnosis present

## 2023-12-13 DIAGNOSIS — R194 Change in bowel habit: Secondary | ICD-10-CM | POA: Insufficient documentation

## 2023-12-13 DIAGNOSIS — Z794 Long term (current) use of insulin: Secondary | ICD-10-CM | POA: Insufficient documentation

## 2023-12-13 DIAGNOSIS — I10 Essential (primary) hypertension: Secondary | ICD-10-CM | POA: Diagnosis not present

## 2023-12-13 DIAGNOSIS — Z79899 Other long term (current) drug therapy: Secondary | ICD-10-CM | POA: Insufficient documentation

## 2023-12-13 DIAGNOSIS — I480 Paroxysmal atrial fibrillation: Secondary | ICD-10-CM | POA: Diagnosis not present

## 2023-12-13 DIAGNOSIS — Z5329 Procedure and treatment not carried out because of patient's decision for other reasons: Secondary | ICD-10-CM | POA: Diagnosis not present

## 2023-12-13 DIAGNOSIS — I4891 Unspecified atrial fibrillation: Secondary | ICD-10-CM

## 2023-12-13 LAB — CBC WITH DIFFERENTIAL/PLATELET
Abs Immature Granulocytes: 0.15 10*3/uL — ABNORMAL HIGH (ref 0.00–0.07)
Basophils Absolute: 0.1 10*3/uL (ref 0.0–0.1)
Basophils Relative: 1 %
Eosinophils Absolute: 0.1 10*3/uL (ref 0.0–0.5)
Eosinophils Relative: 1 %
HCT: 42.2 % (ref 39.0–52.0)
Hemoglobin: 13.4 g/dL (ref 13.0–17.0)
Immature Granulocytes: 1 %
Lymphocytes Relative: 20 %
Lymphs Abs: 2.2 10*3/uL (ref 0.7–4.0)
MCH: 27.4 pg (ref 26.0–34.0)
MCHC: 31.8 g/dL (ref 30.0–36.0)
MCV: 86.3 fL (ref 80.0–100.0)
Monocytes Absolute: 0.9 10*3/uL (ref 0.1–1.0)
Monocytes Relative: 8 %
Neutro Abs: 7.5 10*3/uL (ref 1.7–7.7)
Neutrophils Relative %: 69 %
Platelets: 326 10*3/uL (ref 150–400)
RBC: 4.89 MIL/uL (ref 4.22–5.81)
RDW: 14.1 % (ref 11.5–15.5)
WBC: 10.9 10*3/uL — ABNORMAL HIGH (ref 4.0–10.5)
nRBC: 0 % (ref 0.0–0.2)

## 2023-12-13 LAB — COMPREHENSIVE METABOLIC PANEL WITH GFR
ALT: 12 U/L (ref 0–44)
AST: 13 U/L — ABNORMAL LOW (ref 15–41)
Albumin: 3.1 g/dL — ABNORMAL LOW (ref 3.5–5.0)
Alkaline Phosphatase: 68 U/L (ref 38–126)
Anion gap: 12 (ref 5–15)
BUN: 17 mg/dL (ref 8–23)
CO2: 25 mmol/L (ref 22–32)
Calcium: 9 mg/dL (ref 8.9–10.3)
Chloride: 95 mmol/L — ABNORMAL LOW (ref 98–111)
Creatinine, Ser: 0.78 mg/dL (ref 0.61–1.24)
GFR, Estimated: >60 mL/min (ref >60)
Glucose, Bld: 200 mg/dL — ABNORMAL HIGH (ref 70–99)
Potassium: 2.9 mmol/L — ABNORMAL LOW (ref 3.5–5.1)
Sodium: 132 mmol/L — ABNORMAL LOW (ref 135–145)
Total Bilirubin: 0.8 mg/dL (ref 0.0–1.2)
Total Protein: 6.3 g/dL — ABNORMAL LOW (ref 6.5–8.1)

## 2023-12-13 LAB — LIPASE, BLOOD: Lipase: 29 U/L (ref 11–51)

## 2023-12-13 MED ORDER — SODIUM CHLORIDE 0.9 % IV BOLUS
1000.0000 mL | Freq: Once | INTRAVENOUS | Status: AC
  Administered 2023-12-13: 1000 mL via INTRAVENOUS

## 2023-12-13 MED ORDER — ROCURONIUM BROMIDE 10 MG/ML (PF) SYRINGE
PREFILLED_SYRINGE | INTRAVENOUS | Status: AC
  Filled 2023-12-13: qty 10

## 2023-12-13 MED ORDER — METOPROLOL SUCCINATE ER 25 MG PO TB24
25.0000 mg | ORAL_TABLET | Freq: Once | ORAL | Status: AC
  Administered 2023-12-13: 25 mg via ORAL
  Filled 2023-12-13: qty 1

## 2023-12-13 MED ORDER — ACETAMINOPHEN 500 MG PO TABS
1000.0000 mg | ORAL_TABLET | Freq: Once | ORAL | Status: AC
  Administered 2023-12-13: 1000 mg via ORAL
  Filled 2023-12-13: qty 2

## 2023-12-13 MED ORDER — DRONEDARONE HCL 400 MG PO TABS
400.0000 mg | ORAL_TABLET | Freq: Once | ORAL | Status: AC
  Administered 2023-12-13: 400 mg via ORAL
  Filled 2023-12-13: qty 1

## 2023-12-13 MED ORDER — ETOMIDATE 2 MG/ML IV SOLN
INTRAVENOUS | Status: AC
  Filled 2023-12-13: qty 20

## 2023-12-13 MED ORDER — IOHEXOL 300 MG/ML  SOLN
100.0000 mL | Freq: Once | INTRAMUSCULAR | Status: AC | PRN
  Administered 2023-12-13: 100 mL via INTRAVENOUS

## 2023-12-13 MED ORDER — FENTANYL CITRATE PF 50 MCG/ML IJ SOSY
50.0000 ug | PREFILLED_SYRINGE | Freq: Once | INTRAMUSCULAR | Status: DC
  Filled 2023-12-13: qty 1

## 2023-12-13 MED ORDER — METOPROLOL TARTRATE 5 MG/5ML IV SOLN
5.0000 mg | Freq: Once | INTRAVENOUS | Status: AC
  Administered 2023-12-13: 5 mg via INTRAVENOUS
  Filled 2023-12-13: qty 5

## 2023-12-13 MED ORDER — SUCCINYLCHOLINE CHLORIDE 200 MG/10ML IV SOSY
PREFILLED_SYRINGE | INTRAVENOUS | Status: AC
  Filled 2023-12-13: qty 10

## 2023-12-13 MED ORDER — POTASSIUM CHLORIDE CRYS ER 20 MEQ PO TBCR
40.0000 meq | EXTENDED_RELEASE_TABLET | Freq: Once | ORAL | Status: DC
  Filled 2023-12-13: qty 2

## 2023-12-13 NOTE — ED Notes (Signed)
 Pt is aware of high pulse rate and continues to request to leave. Pt given a sandwich provider notified.

## 2023-12-13 NOTE — ED Provider Notes (Cosign Needed)
 [place] EMERGENCY DEPARTMENT AT [facility] Provider Note   CSN: [id number] Arrival date & time: 12/13/23  9951     Patient presents with: Abdominal Pain   KALANI STHILAIRE is a 68 y.o. male.   The history is provided by the patient and medical records.  Abdominal Pain  68 year old male with history of anxiety, BPH, degenerative disc disease, diabetes, hypertension, frequent falls, paroxysmal A-fib, not currently on anticoagulation, presenting to the ED with abdominal pain.  States it feels like something is blocked inside.  He has not been able to have a bowel movement a few days.  He is still passing flatus without issue.  Was able to eat normal dinner tonight.  He has not had any vomiting.  He denies any prior abdominal surgeries.  Prior to Admission medications   Medication Sig Start Date End Date Taking? Authorizing Provider  acetaminophen  (TYLENOL ) 325 MG tablet Take 1 tablet (325 mg total) by mouth every 6 (six) hours as needed for mild pain (or Fever >/= 101). 07/28/22   Elgergawy, Brayton RAMAN, MD  acidophilus (RISAQUAD) CAPS capsule Take 2 capsules by mouth daily. 10/24/23   Vann, Jessica U, DO  amLODipine  (NORVASC ) 10 MG tablet Take 10 mg by mouth daily. 09/10/22   [provider]  atorvastatin  (LIPITOR) 10 MG tablet Take 1 tablet by mouth daily. 10/12/23 10/06/24  [provider]  bacitracin ointment Apply 1 Application topically as needed for wound care. 07/04/23 07/03/24  [provider]  chlorthalidone (HYGROTON) 25 MG tablet Take 25 mg by mouth daily. 08/13/22   [provider]  Docusate Sodium  (DSS) 100 MG CAPS Take 1 capsule by mouth daily. 10/12/23 10/11/24  [provider]  DULoxetine  (CYMBALTA ) 60 MG capsule Take 1 capsule by mouth daily. 10/12/23   [provider]  gabapentin  (NEURONTIN ) 300 MG capsule Take 300-1,500 mg by mouth daily. Take 300mg  (1 capsule) by mouth in the mornings and 1500mg  (5 capsules) in the  evening. 08/03/16   [provider]  GAVILAX 17 GM/SCOOP powder Take 17 g by mouth daily as needed for mild constipation or moderate constipation. 10/01/23   [provider]  insulin  glargine-yfgn (SEMGLEE ) 100 UNIT/ML injection Inject 0.15 mLs (15 Units total) into the skin daily. 10/24/23   Vann, Jessica U, DO  JARDIANCE 10 MG TABS tablet Take 10 mg by mouth daily. 08/09/22   [provider]  losartan (COZAAR) 50 MG tablet Take 50 mg by mouth daily. 07/14/16   [provider]  meloxicam  (MOBIC ) 7.5 MG tablet Take 1 tablet (7.5 mg total) by mouth daily. 11/21/23   Mesner, Selinda, MD  methocarbamol  (ROBAXIN ) 500 MG tablet Take 500 mg by mouth as needed for muscle spasms. 10/12/23 10/01/24  [provider]  metoprolol  succinate (TOPROL -XL) 25 MG 24 hr tablet Take 1 tablet by mouth daily. 10/12/23   [provider]  MULTAQ  400 MG tablet Take 400 mg by mouth 2 (two) times daily. 10/17/23   [provider]  Multiple Vitamins-Minerals (MULTIVITAMIN WITH MINERALS) tablet Take 1 tablet by mouth daily.    [provider]  potassium chloride  SA (KLOR-CON  M) 20 MEQ tablet Take 2 tablets (40 mEq total) by mouth daily. 10/24/23   Vann, Jessica U, DO  Semaglutide (RYBELSUS) 7 MG TABS Take by mouth.    [provider]  sulfamethoxazole -trimethoprim  (BACTRIM ) 400-80 MG tablet Take 1 tablet by mouth every 12 (twelve) hours. 10/23/23   Vann, Jessica U, DO  tamsulosin  (  FLOMAX ) 0.4 MG CAPS capsule Take 0.4 mg by mouth daily. 09/28/22   [provider]  traZODone (DESYREL) 50 MG tablet Take 50 mg by mouth at bedtime. 10/12/23 08/28/24  [provider]    Allergies: Metformin, Oxycodone, Oxycodone-acetaminophen , Lisinopril, Morphine, and Morphine and codeine    Review of Systems  Gastrointestinal:  Positive for abdominal pain.  All other systems reviewed and are negative.   Updated Vital Signs BP 115/77 (BP Location: Left Arm)    Pulse (!) 139   Temp 98.5 F (36.9 C) (Oral)   Resp 17   SpO2 93%   Physical Exam Vitals and nursing note reviewed.  Constitutional:      Appearance: He is well-developed.  HENT:     Head: Normocephalic and atraumatic.   Eyes:     Conjunctiva/sclera: Conjunctivae normal.     Pupils: Pupils are equal, round, and reactive to light.    Cardiovascular:     Rate and Rhythm: Normal rate and regular rhythm.     Heart sounds: Normal heart sounds.  Pulmonary:     Effort: Pulmonary effort is normal.     Breath sounds: Normal breath sounds.  Abdominal:     General: Bowel sounds are normal.     Palpations: Abdomen is soft.     Comments: Soft, somewhat generalized tenderness, feels a bit distended   Musculoskeletal:        General: Normal range of motion.     Cervical back: Normal range of motion.   Skin:    General: Skin is warm and dry.   Neurological:     Mental Status: He is alert and oriented to person, place, and time.     (all labs ordered are listed, but only abnormal results are displayed) Labs Reviewed  COMPREHENSIVE METABOLIC PANEL WITH GFR - Abnormal; Notable for the following components:      Result Value   Sodium 132 (*)    Potassium 2.9 (*)    Chloride 95 (*)    Glucose, Bld 200 (*)    Total Protein 6.3 (*)    Albumin 3.1 (*)    AST 13 (*)    All other components within normal limits  CBC WITH DIFFERENTIAL/PLATELET - Abnormal; Notable for the following components:   WBC 10.9 (*)    Abs Immature Granulocytes 0.15 (*)    All other components within normal limits  LIPASE, BLOOD    EKG: None  Radiology: CT ABDOMEN PELVIS W CONTRAST Result Date: 12/13/2023 CLINICAL DATA:  Generalized abdominal pain, no bowel movement for several days EXAM: CT ABDOMEN AND PELVIS WITH CONTRAST TECHNIQUE: Multidetector CT imaging of the abdomen and pelvis was performed using the standard protocol following bolus administration of intravenous contrast. RADIATION DOSE  REDUCTION: This exam was performed according to the departmental dose-optimization program which includes automated exposure control, adjustment of the mA and/or kV according to patient size and/or use of iterative reconstruction technique. CONTRAST:  OMNIPAQUE  IOHEXOL  300 MG/ML  SOLN COMPARISON:  07/23/2022 FINDINGS: Lower chest: Lung bases demonstrate mild scarring. No focal infiltrate or effusion is seen. Hepatobiliary: Gallbladder is within normal limits. A small cyst is noted within the right lobe of the liver. No other focal abnormality is seen. Pancreas: Unremarkable. No pancreatic ductal dilatation or surrounding inflammatory changes. Spleen: Normal in size without focal abnormality. Adrenals/Urinary Tract: Adrenal glands are within normal limits. The kidneys demonstrate nonobstructing renal calculi bilaterally slightly worse on the right than the left. No hydronephrosis or hydroureter is  seen. The bladder is well distended. Stomach/Bowel: Mild fecal material is noted throughout the colon. No obstructive or inflammatory changes of the colon are seen. The appendix is within normal limits. Small bowel and stomach are unremarkable. Vascular/Lymphatic: Aortic atherosclerosis. No enlarged abdominal or pelvic lymph nodes. Reproductive: Prostate is unremarkable. Other: No abdominal wall hernia or abnormality. No abdominopelvic ascites. Musculoskeletal: InterStim device is noted on the left. Degenerative changes of lumbar spine are noted. Old rib fractures are seen. IMPRESSION: Nonobstructing renal calculi bilaterally. The largest of these again measures 7 mm on the right. Electronically Signed   By: Oneil Devonshire M.D.   On: 12/13/2023 03:38     Procedures   Medications Ordered in the ED  fentaNYL  (SUBLIMAZE ) injection 50 mcg (50 mcg Intravenous Patient Refused/Not Given 12/13/23 0253)  potassium chloride  SA (KLOR-CON  M) CR tablet 40 mEq (40 mEq Oral Patient Refused/Not Given 12/13/23 0259)  metoprolol   succinate (TOPROL -XL) 24 hr tablet 25 mg (has no administration in time range)  metoprolol  tartrate (LOPRESSOR ) injection 5 mg (has no administration in time range)  sodium chloride  0.9 % bolus 1,000 mL (0 mLs Intravenous Stopped 12/13/23 0253)  iohexol  (OMNIPAQUE ) 300 MG/ML solution 100 mL (100 mLs Intravenous Contrast Given 12/13/23 0308)  acetaminophen  (TYLENOL ) tablet 1,000 mg (1,000 mg Oral Given 12/13/23 0258)  dronedarone  (MULTAQ ) tablet 400 mg (400 mg Oral Given 12/13/23 0403)                                    Medical Decision Making Amount and/or Complexity of Data Reviewed Labs: ordered. Radiology: ordered and independent interpretation performed. ECG/medicine tests: ordered and independent interpretation performed.  Risk OTC drugs. Prescription drug management.   68 year old male presenting to the ED with abdominal pain.  Has been eating and drinking fine but no BM for the past few days.  No vomiting.  States he feels like something is blocked.  He is afebrile and nontoxic in appearance here.  He is tachycardic on arrival.  Labs were obtained, minimal leukocytosis at 10.9.  Potassium is low at 2.9.  Ordered oral potassium supplementation but refused here even though he takes this at home.  Awaiting CT.  3:53 AM CT with bilateral renal calculi.  There is some stool but no fecal impaction or obstructive findings.  Results discussed with patient. He will need to initiate bowel regimen with miralax or similar.  States he takes this on occasion but not recently. Heart rate is in the 120s, seems to have gone into A-fib.  Does have history of same.  He is asymptomatic of this.  He is due for his Multaq  in just a few hours so we will go ahead and give that now.  Will monitor.  5:43 AM HR has improved some but still in 130's now.  He remains asymptomatic of this.  Will give morning metoprolol  and small dose of IV Lopressor  to see if this helps.  6:35 AM Delay in meds given as RN in  with critical patient.  Care will be signed out to oncoming provider.  Anticipate d/c once HR better controlled.  Final diagnoses:  Change in bowel habits  PAF (paroxysmal atrial fibrillation) [facility])    ED Discharge Orders     None          Jarold Olam HERO, PA-C 12/13/23 [phone]

## 2023-12-13 NOTE — ED Notes (Signed)
 IV removed, pt moved to [facility] to wait for transport.

## 2023-12-13 NOTE — ED Triage Notes (Signed)
 Pt from [place] at [facility]. Reports no BM for several days. Tenderness and pain to belly. Eating normally and passing gas. Has not tried anything OTC for constipation. VSS. No hx of abdominal surgeries.

## 2023-12-13 NOTE — ED Provider Notes (Signed)
 Signout from [employer] PA-C/Dr. JAYSON Bough at shift change. Briefly, patient presents for abdominal pain.  CT imaging and lab workup is reassuring.  Patient with A-fib with RVR.   Plan: Work to improve heart rate, receiving oral metoprolol  and IV metoprolol  now.   9:07 AM Reassessment performed. Patient appears comfortable.  We discussed his lab work and CT results.  His heart rate continues to be in the 120s.  Patient repeatedly requesting discharge to home.  I discussed with the patient that ideally we would like his heart rate to be more controlled.  He responded that he can just take his medications at home.  Discussed that in the emergency department we are able to give him IV medications.  He continues to request discharge.  Will ensure that patient can get up without significant lightheadedness and is stable.  Labs and imaging personally reviewed and interpreted including: CBC with white blood cell count 10.9, normal hemoglobin; CMP with sodium low at 132, potassium low at 2.9, chloride low at 95, glucose 200 with normal anion gap (patient refused oral potassium earlier); lipase 29.  CT abdomen pelvis with some renal stones, mild fecal material throughout colon without inflammatory change or signs of obstruction, no impaction.   Most current vital signs reviewed and are as follows: BP 113/78   Pulse (!) 123   Temp 97.8 F (36.6 C)   Resp 20   SpO2 96%   Plan: Patient is adamant about being discharged despite persistent elevated heart rate.  He does not appear to be in any respiratory distress.  Blood pressures are near normal.  I would like him to ambulate make sure that he is not unsteady on his feet.  Discussed importance of continued antiarrhythmic and metoprolol .  We did discuss risks and benefits of being discharged.  Discussed symptoms of low blood pressure, shortness of breath, syncope that could occur.  Discussed that this could lead to falls, which patient does frequently per history.   He is currently not anticoagulated due to fall risk.  He feels that he can take his medications at home and does not want to stay in the emergency department any longer.  Encourage patient to return with any worsening symptoms or if he changes his mind.  He appears to have capacity to make medical decisions and has understanding of his decisions.  //  10:10 AM additional dose of metoprolol  ordered.  I went and chatted with the patient about his living situation.  Heart rate around 120.  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  He is mostly bedbound.  Sometimes he can get up a little bit with assistance, but is not able to do so on his own.  Continues to want to be discharged.  10:51 AM Per RN, patient calling out requesting dc. HR remains high. Will have patient signout AMA. Typically would admit for afib control. Fortunately he seems relatively asymptomatic.  BP 120/77   Pulse (!) 139   Temp 98.2 F (36.8 C) (Oral)   Resp 20   SpO2 90%     Home treatment: Strongly encouraged to continue home medications.    Return and follow-up instructions: Encouraged return to ED with with chest pain, shortness of breath, lightheaded, syncope, new or worsening symptoms, or if he changes his mind regarding further care.  Encouraged patient to follow-up with their provider in 2 days.       Bela Nyborg,  PA-C 12/13/23 1053    Suzette Pac, MD 12/14/23 1112

## 2023-12-13 NOTE — ED Notes (Signed)
 Pt is not able to turn in bed w/o assist.   Pt urinated and had bm.  Unable to clean himself.

## 2023-12-13 NOTE — Discharge Instructions (Addendum)
 You do have some stool present on CT but no obstruction or fecal impaction. You will need to start bowel regimen with miralax--- would start 1 cap full in 8oz liquid twice daily until bowels regulate, then can reduce to once daily. Follow-up with your doctor. Return here for new concerns.  We discussed our concern for your elevated heart rate.  It is very important that you continue to take your home medications for atrial fibrillation including dronedarone  and metoprolol .  If you feel worse, get lightheaded, passout, develop chest pain or shortness of breath, or change your mind about further treatment, please return to the emergency department.  Otherwise please follow-up with your doctor in the next 48 hours.

## 2024-01-13 ENCOUNTER — Encounter (HOSPITAL_COMMUNITY): Payer: Self-pay | Admitting: Emergency Medicine

## 2024-01-13 ENCOUNTER — Emergency Department (HOSPITAL_COMMUNITY)

## 2024-01-13 ENCOUNTER — Other Ambulatory Visit: Payer: Self-pay

## 2024-01-13 ENCOUNTER — Observation Stay (HOSPITAL_COMMUNITY)

## 2024-01-13 ENCOUNTER — Inpatient Hospital Stay (HOSPITAL_COMMUNITY)
Admission: EM | Admit: 2024-01-13 | Discharge: 2024-01-19 | DRG: 309 | Disposition: A | Discharge status: Skilled Nursing Facility | Attending: Internal Medicine | Admitting: Internal Medicine

## 2024-01-13 DIAGNOSIS — E66811 Obesity, class 1: Secondary | ICD-10-CM | POA: Diagnosis present

## 2024-01-13 DIAGNOSIS — Z79899 Other long term (current) drug therapy: Secondary | ICD-10-CM

## 2024-01-13 DIAGNOSIS — N400 Enlarged prostate without lower urinary tract symptoms: Secondary | ICD-10-CM | POA: Diagnosis present

## 2024-01-13 DIAGNOSIS — E038 Other specified hypothyroidism: Secondary | ICD-10-CM | POA: Diagnosis present

## 2024-01-13 DIAGNOSIS — I4891 Unspecified atrial fibrillation: Secondary | ICD-10-CM

## 2024-01-13 DIAGNOSIS — E785 Hyperlipidemia, unspecified: Secondary | ICD-10-CM | POA: Diagnosis present

## 2024-01-13 DIAGNOSIS — E1140 Type 2 diabetes mellitus with diabetic neuropathy, unspecified: Secondary | ICD-10-CM | POA: Diagnosis present

## 2024-01-13 DIAGNOSIS — G4733 Obstructive sleep apnea (adult) (pediatric): Secondary | ICD-10-CM | POA: Diagnosis present

## 2024-01-13 DIAGNOSIS — M51369 Other intervertebral disc degeneration, lumbar region without mention of lumbar back pain or lower extremity pain: Secondary | ICD-10-CM | POA: Diagnosis present

## 2024-01-13 DIAGNOSIS — E1165 Type 2 diabetes mellitus with hyperglycemia: Secondary | ICD-10-CM | POA: Diagnosis present

## 2024-01-13 DIAGNOSIS — M25561 Pain in right knee: Secondary | ICD-10-CM

## 2024-01-13 DIAGNOSIS — R296 Repeated falls: Secondary | ICD-10-CM | POA: Diagnosis present

## 2024-01-13 DIAGNOSIS — Z9181 History of falling: Secondary | ICD-10-CM

## 2024-01-13 DIAGNOSIS — Z841 Family history of disorders of kidney and ureter: Secondary | ICD-10-CM

## 2024-01-13 DIAGNOSIS — I4892 Unspecified atrial flutter: Secondary | ICD-10-CM | POA: Diagnosis present

## 2024-01-13 DIAGNOSIS — F32A Depression, unspecified: Secondary | ICD-10-CM | POA: Diagnosis present

## 2024-01-13 DIAGNOSIS — I480 Paroxysmal atrial fibrillation: Secondary | ICD-10-CM | POA: Diagnosis not present

## 2024-01-13 DIAGNOSIS — Z7984 Long term (current) use of oral hypoglycemic drugs: Secondary | ICD-10-CM

## 2024-01-13 DIAGNOSIS — M170 Bilateral primary osteoarthritis of knee: Secondary | ICD-10-CM | POA: Diagnosis present

## 2024-01-13 DIAGNOSIS — Z885 Allergy status to narcotic agent status: Secondary | ICD-10-CM

## 2024-01-13 DIAGNOSIS — F419 Anxiety disorder, unspecified: Secondary | ICD-10-CM | POA: Diagnosis present

## 2024-01-13 DIAGNOSIS — I700 Atherosclerosis of aorta: Secondary | ICD-10-CM | POA: Diagnosis present

## 2024-01-13 DIAGNOSIS — Z7901 Long term (current) use of anticoagulants: Secondary | ICD-10-CM

## 2024-01-13 DIAGNOSIS — Z888 Allergy status to other drugs, medicaments and biological substances status: Secondary | ICD-10-CM

## 2024-01-13 DIAGNOSIS — I10 Essential (primary) hypertension: Secondary | ICD-10-CM | POA: Diagnosis present

## 2024-01-13 DIAGNOSIS — I3139 Other pericardial effusion (noninflammatory): Secondary | ICD-10-CM | POA: Diagnosis not present

## 2024-01-13 DIAGNOSIS — I2510 Atherosclerotic heart disease of native coronary artery without angina pectoris: Secondary | ICD-10-CM | POA: Diagnosis present

## 2024-01-13 DIAGNOSIS — Z6834 Body mass index (BMI) 34.0-34.9, adult: Secondary | ICD-10-CM

## 2024-01-13 DIAGNOSIS — M5030 Other cervical disc degeneration, unspecified cervical region: Secondary | ICD-10-CM | POA: Diagnosis present

## 2024-01-13 DIAGNOSIS — E876 Hypokalemia: Secondary | ICD-10-CM | POA: Diagnosis present

## 2024-01-13 DIAGNOSIS — Z794 Long term (current) use of insulin: Secondary | ICD-10-CM

## 2024-01-13 DIAGNOSIS — Z820 Family history of epilepsy and other diseases of the nervous system: Secondary | ICD-10-CM

## 2024-01-13 HISTORY — DX: Atherosclerotic heart disease of native coronary artery without angina pectoris: I25.10

## 2024-01-13 HISTORY — DX: Supraventricular tachycardia, unspecified: I47.10

## 2024-01-13 LAB — HEPATIC FUNCTION PANEL
ALT: 12 U/L (ref 0–44)
AST: 13 U/L — ABNORMAL LOW (ref 15–41)
Albumin: 2.9 g/dL — ABNORMAL LOW (ref 3.5–5.0)
Alkaline Phosphatase: 55 U/L (ref 38–126)
Bilirubin, Direct: 0.2 mg/dL (ref 0.0–0.2)
Indirect Bilirubin: 0.9 mg/dL (ref 0.3–0.9)
Total Bilirubin: 1.1 mg/dL (ref 0.0–1.2)
Total Protein: 6.2 g/dL — ABNORMAL LOW (ref 6.5–8.1)

## 2024-01-13 LAB — ECHOCARDIOGRAM COMPLETE
Area-P 1/2: 6.17 cm2
Calc EF: 72.6 %
Height: 70 in
S' Lateral: 3.3 cm
Single Plane A2C EF: 74.6 %
Single Plane A4C EF: 74.6 %
Weight: 3840 [oz_av]

## 2024-01-13 LAB — MAGNESIUM: Magnesium: 1.9 mg/dL (ref 1.7–2.4)

## 2024-01-13 LAB — CBC WITH DIFFERENTIAL/PLATELET
Abs Immature Granulocytes: 0.09 K/uL — ABNORMAL HIGH (ref 0.00–0.07)
Basophils Absolute: 0.1 K/uL (ref 0.0–0.1)
Basophils Relative: 0 %
Eosinophils Absolute: 0.2 K/uL (ref 0.0–0.5)
Eosinophils Relative: 1 %
HCT: 45.9 % (ref 39.0–52.0)
Hemoglobin: 14.3 g/dL (ref 13.0–17.0)
Immature Granulocytes: 1 %
Lymphocytes Relative: 9 %
Lymphs Abs: 1.3 K/uL (ref 0.7–4.0)
MCH: 26 pg (ref 26.0–34.0)
MCHC: 31.2 g/dL (ref 30.0–36.0)
MCV: 83.5 fL (ref 80.0–100.0)
Monocytes Absolute: 1.3 K/uL — ABNORMAL HIGH (ref 0.1–1.0)
Monocytes Relative: 9 %
Neutro Abs: 11.4 K/uL — ABNORMAL HIGH (ref 1.7–7.7)
Neutrophils Relative %: 80 %
Platelets: 325 K/uL (ref 150–400)
RBC: 5.5 MIL/uL (ref 4.22–5.81)
RDW: 14.1 % (ref 11.5–15.5)
WBC: 14.3 K/uL — ABNORMAL HIGH (ref 4.0–10.5)
nRBC: 0 % (ref 0.0–0.2)

## 2024-01-13 LAB — BASIC METABOLIC PANEL WITH GFR
Anion gap: 12 (ref 5–15)
BUN: 16 mg/dL (ref 8–23)
CO2: 28 mmol/L (ref 22–32)
Calcium: 10.1 mg/dL (ref 8.9–10.3)
Chloride: 99 mmol/L (ref 98–111)
Creatinine, Ser: 0.72 mg/dL (ref 0.61–1.24)
GFR, Estimated: >60 mL/min (ref >60)
Glucose, Bld: 181 mg/dL — ABNORMAL HIGH (ref 70–99)
Potassium: 4 mmol/L (ref 3.5–5.1)
Sodium: 139 mmol/L (ref 135–145)

## 2024-01-13 LAB — GLUCOSE, CAPILLARY
Glucose-Capillary: 121 mg/dL — ABNORMAL HIGH (ref 70–99)
Glucose-Capillary: 164 mg/dL — ABNORMAL HIGH (ref 70–99)
Glucose-Capillary: 201 mg/dL — ABNORMAL HIGH (ref 70–99)

## 2024-01-13 LAB — CBG MONITORING, ED: Glucose-Capillary: 191 mg/dL — ABNORMAL HIGH (ref 70–99)

## 2024-01-13 LAB — PHOSPHORUS: Phosphorus: 3.2 mg/dL (ref 2.5–4.6)

## 2024-01-13 LAB — TROPONIN I (HIGH SENSITIVITY): Troponin I (High Sensitivity): 7 ng/L (ref <18)

## 2024-01-13 LAB — TSH: TSH: 2.084 u[IU]/mL (ref 0.350–4.500)

## 2024-01-13 LAB — BRAIN NATRIURETIC PEPTIDE: B Natriuretic Peptide: 72.7 pg/mL (ref 0.0–100.0)

## 2024-01-13 MED ORDER — TRAZODONE HCL 50 MG PO TABS
50.0000 mg | ORAL_TABLET | Freq: Every day | ORAL | Status: DC
  Administered 2024-01-13 – 2024-01-18 (×6): 50 mg via ORAL
  Filled 2024-01-13 (×6): qty 1

## 2024-01-13 MED ORDER — TAMSULOSIN HCL 0.4 MG PO CAPS
0.4000 mg | ORAL_CAPSULE | Freq: Every day | ORAL | Status: DC
Start: 2024-01-13 — End: 2024-01-19
  Administered 2024-01-13 – 2024-01-19 (×7): 0.4 mg via ORAL
  Filled 2024-01-13 (×7): qty 1

## 2024-01-13 MED ORDER — SODIUM CHLORIDE 0.9 % IV BOLUS
1000.0000 mL | Freq: Once | INTRAVENOUS | Status: AC
  Administered 2024-01-13: 1000 mL via INTRAVENOUS

## 2024-01-13 MED ORDER — METOPROLOL SUCCINATE ER 25 MG PO TB24: 25.0000 mg | ORAL_TABLET | Freq: Every day | ORAL | Status: DC

## 2024-01-13 MED ORDER — METOPROLOL SUCCINATE ER 50 MG PO TB24: 25.0000 mg | ORAL_TABLET | Freq: Every day | ORAL | Status: DC

## 2024-01-13 MED ORDER — METOPROLOL TARTRATE 25 MG PO TABS
25.0000 mg | ORAL_TABLET | Freq: Three times a day (TID) | ORAL | Status: DC
  Administered 2024-01-13 (×2): 25 mg via ORAL
  Filled 2024-01-13 (×3): qty 1

## 2024-01-13 MED ORDER — DILTIAZEM HCL-DEXTROSE 125-5 MG/125ML-% IV SOLN (PREMIX)
5.0000 mg/h | INTRAVENOUS | Status: DC
  Administered 2024-01-13: 15 mg/h via INTRAVENOUS
  Administered 2024-01-13: 5 mg/h via INTRAVENOUS
  Administered 2024-01-13 – 2024-01-15 (×5): 15 mg/h via INTRAVENOUS
  Filled 2024-01-13 (×6): qty 125

## 2024-01-13 MED ORDER — POTASSIUM CHLORIDE CRYS ER 20 MEQ PO TBCR
40.0000 meq | EXTENDED_RELEASE_TABLET | Freq: Every day | ORAL | Status: DC
  Administered 2024-01-13: 40 meq via ORAL
  Filled 2024-01-13: qty 2

## 2024-01-13 MED ORDER — PROCHLORPERAZINE EDISYLATE 10 MG/2ML IJ SOLN: 10.0000 mg | Freq: Four times a day (QID) | INTRAMUSCULAR | Status: DC | PRN

## 2024-01-13 MED ORDER — ATORVASTATIN CALCIUM 10 MG PO TABS
10.0000 mg | ORAL_TABLET | Freq: Every day | ORAL | Status: DC
  Administered 2024-01-13 – 2024-01-19 (×7): 10 mg via ORAL
  Filled 2024-01-13 (×7): qty 1

## 2024-01-13 MED ORDER — EMPAGLIFLOZIN 10 MG PO TABS
10.0000 mg | ORAL_TABLET | Freq: Every day | ORAL | Status: DC
  Administered 2024-01-13 – 2024-01-19 (×7): 10 mg via ORAL
  Filled 2024-01-13 (×7): qty 1

## 2024-01-13 MED ORDER — METOPROLOL TARTRATE 5 MG/5ML IV SOLN
5.0000 mg | Freq: Once | INTRAVENOUS | Status: AC
  Administered 2024-01-13: 5 mg via INTRAVENOUS
  Filled 2024-01-13: qty 5

## 2024-01-13 MED ORDER — ACETAMINOPHEN 325 MG PO TABS
650.0000 mg | ORAL_TABLET | Freq: Four times a day (QID) | ORAL | Status: DC | PRN
  Administered 2024-01-14 – 2024-01-19 (×3): 650 mg via ORAL
  Filled 2024-01-13 (×3): qty 2

## 2024-01-13 MED ORDER — GABAPENTIN 300 MG PO CAPS
300.0000 mg | ORAL_CAPSULE | Freq: Every day | ORAL | Status: DC
  Administered 2024-01-13 – 2024-01-19 (×7): 300 mg via ORAL
  Filled 2024-01-13 (×7): qty 1

## 2024-01-13 MED ORDER — SEMAGLUTIDE 7 MG PO TABS: 1.0000 | ORAL_TABLET | Freq: Every day | ORAL | Status: DC

## 2024-01-13 MED ORDER — DILTIAZEM HCL 25 MG/5ML IV SOLN
15.0000 mg | Freq: Once | INTRAVENOUS | Status: AC
  Administered 2024-01-13: 15 mg via INTRAVENOUS
  Filled 2024-01-13: qty 5

## 2024-01-13 MED ORDER — MAGNESIUM SULFATE 2 GM/50ML IV SOLN
2.0000 g | Freq: Once | INTRAVENOUS | Status: AC
  Administered 2024-01-13: 2 g via INTRAVENOUS
  Filled 2024-01-13: qty 50

## 2024-01-13 MED ORDER — ACETAMINOPHEN 650 MG RE SUPP: 650.0000 mg | Freq: Four times a day (QID) | RECTAL | Status: DC | PRN

## 2024-01-13 MED ORDER — CYCLOBENZAPRINE HCL 10 MG PO TABS
10.0000 mg | ORAL_TABLET | Freq: Every day | ORAL | Status: DC
  Administered 2024-01-13 – 2024-01-18 (×6): 10 mg via ORAL
  Filled 2024-01-13 (×6): qty 1

## 2024-01-13 MED ORDER — INSULIN ASPART 100 UNIT/ML IJ SOLN
0.0000 [IU] | Freq: Three times a day (TID) | INTRAMUSCULAR | Status: DC
  Administered 2024-01-13: 3 [IU] via SUBCUTANEOUS
  Administered 2024-01-13: 2 [IU] via SUBCUTANEOUS
  Administered 2024-01-13 – 2024-01-14 (×4): 3 [IU] via SUBCUTANEOUS
  Administered 2024-01-15: 2 [IU] via SUBCUTANEOUS
  Administered 2024-01-15: 3 [IU] via SUBCUTANEOUS
  Administered 2024-01-15: 2 [IU] via SUBCUTANEOUS
  Administered 2024-01-16 (×2): 3 [IU] via SUBCUTANEOUS
  Administered 2024-01-16: 2 [IU] via SUBCUTANEOUS
  Administered 2024-01-17 (×2): 3 [IU] via SUBCUTANEOUS
  Administered 2024-01-17: 5 [IU] via SUBCUTANEOUS
  Administered 2024-01-18: 3 [IU] via SUBCUTANEOUS
  Administered 2024-01-18: 2 [IU] via SUBCUTANEOUS
  Administered 2024-01-18 – 2024-01-19 (×3): 3 [IU] via SUBCUTANEOUS
  Filled 2024-01-13: qty 0.15

## 2024-01-13 MED ORDER — ENOXAPARIN SODIUM 40 MG/0.4ML IJ SOSY
40.0000 mg | PREFILLED_SYRINGE | INTRAMUSCULAR | Status: DC
  Administered 2024-01-13 – 2024-01-16 (×3): 40 mg via SUBCUTANEOUS
  Filled 2024-01-13 (×4): qty 0.4

## 2024-01-13 MED ORDER — DULOXETINE HCL 60 MG PO CPEP
60.0000 mg | ORAL_CAPSULE | Freq: Every day | ORAL | Status: DC
  Administered 2024-01-13 – 2024-01-19 (×7): 60 mg via ORAL
  Filled 2024-01-13 (×5): qty 1
  Filled 2024-01-13: qty 2
  Filled 2024-01-13: qty 1

## 2024-01-13 MED ORDER — GABAPENTIN 300 MG PO CAPS
1500.0000 mg | ORAL_CAPSULE | Freq: Every day | ORAL | Status: DC
  Administered 2024-01-13 – 2024-01-18 (×6): 1500 mg via ORAL
  Filled 2024-01-13 (×6): qty 5

## 2024-01-13 MED ORDER — PERFLUTREN LIPID MICROSPHERE
1.0000 mL | INTRAVENOUS | Status: AC | PRN
  Administered 2024-01-13: 2 mL via INTRAVENOUS

## 2024-01-13 NOTE — Progress Notes (Signed)
 Pt is at max does of cardizem  for this unit. HR 140-142. pt is asymptomatic at this time. Maintaining BP with max Cardizem . Notification  of provider done

## 2024-01-13 NOTE — ED Notes (Signed)
 PT able to take PO with set up without difficulty

## 2024-01-13 NOTE — Consult Note (Signed)
 Cardiology Consultation   Patient ID: ARIQ KHAMIS MRN: [id number]; DOB: April 13, 1956  Admit date: 01/13/2024 Date of Consult: 01/13/2024  PCP:  Agustina Greig NOVAK, MD   [facility] Providers Cardiologist:  Various providers - previously Dr. Dewane -> Dr. Francyne (due to local referral from ER) -> chronically followed by [facility] Click here to update MD or APP on Care Team, Refresh:1}     Patient Profile: Alan Lin is a 68 y.o. male with a hx of DM, HLD, HTN, PAF, PSVT, nonobstructive CAD by cath 2019, subclinical hypothyroidism, arthritis, hypomagnesemia, hypophosphatemia, sleep apnea/CPAP, frequent falls, possible normal pressure hydrocephalus (seen by neurology), degenerative disc disease, anxiety who is being seen 01/13/2024 for the evaluation of atrial arrythmia at the request of Dr. Celinda.  History of Present Illness: Mr. Sudduth has previously seen cardiology at [facility], Dr. Dewane, and Dr. Francyne. Remote cath 2019 reportedly showed nonobstructive CAD with 40% pRCA and 30% OM1, prompted by abnormal stress echo. He was diagnosed with atrial fibrillation in 07/2022 after admission for multiple falls with question of syncope in setting of gastroenteritis. He was started on IV amiodarone  and spontaneously reverted to NSR. He was placed on Eliquis  and transitioned to oral amiodarone . Event monitor ordered by Dr. Custovic but not completed at that time. Nuc 08/2022 showed small sized reversible mild defect in the mid to apical lateral, inferior distribution, stress EF 61% listed as 41% later in the report. He then had a recurrent fall 08/2022 and saw Dr. Francyne in follow-up and was in NSR at that time, maintained on Eliquis  + amiodarone  but thinking long term amiodarone  might not be optimal given his age. Subsequent to that he then was following with [facility]. In 09/2022 his amiodarone  was stopped with a plan for f/u Zio after washout; Eliquis  also discontinued (not specifically elaborated on).  F/u Zio reportedly showed >3000 brief episodes of SVT (probable afib) in 12 days. There was hesitancy to resume anticoagulation due to continued frequent falls and hesitancy to use amiodarone  given subclnical hypothyroidism. Notes mention patient would be open to resuming. He was started on Multaq  01/2023 with decreased burden to 2% SVT (no specific mention of atrial fib). Last echo was at [facility] 10/2022 which was technically difficult study, EF >55%, aortic sclerosis without stenosis. He was last admitted in our system 10/2023 with generalized weakness, ground level mechanical falls, sepsis due to acute cystitis, dehydration, and ankle sprain, some confusion. He had an additional ED visit 11/20/23 (weakness/gluteal pain) and 12/13/23 (change in bowel habits/constipation), the latter of which he was tachycardic in the 120s with consideration of admission but patient adamant about discharge - EKG showed likely atrial flutter RVR at that time.  He returned to the ED by EMS overnight with right knee pain - woke up with leg hanging off the bed unable to ambulate. Prior notes indicate he has been mostly bedbound. He was noted to be a poor historian. Knee films showed advanced vascular calcification but no acute orthopedic abnormality. CXR low lung volumes without acute cardiopulm process, + aortic atherosclerosis. Labs otherwise show leukocytosis of 14.3 (worsened from June), normal BNP, normal troponin, Cr 0.72, lytes OK. He remained tachycardic in the ED and EKG again showed suspected atrial flutter with 2:1 conduction. He did not respond to several doses of IV metoprolol . He was subsequently given IVF bolus, 2g mag sulfate, and started on diltiazem  bolus (15mg ) and drip.  Rates remain elevated. He has no CP, SOB or palpitations noted. He is wearing  an apple watch and says his watch tells him often that he is in Afib. He cannot tell me more specific details. I brought up the concept of DCCV and TEE but cautioned that  anticoagulation is required to perform, even if just used short term, 1-2 months. He does not feel anticoagulation is warranted and declines it's use. I did tell him this makes his rhythm management more challenging.   Past Medical History:  Diagnosis Date   Arthritis    Diabetes mellitus without complication (HCC)    Hypertension    Mild CAD    Paroxysmal atrial fibrillation (HCC)    PSVT (paroxysmal supraventricular tachycardia) (HCC)    Sleep apnea     History reviewed. No pertinent surgical history.   Home Medications:  Prior to Admission medications   Medication Sig Start Date End Date Taking? Authorizing Provider  amLODipine  (NORVASC ) 10 MG tablet Take 10 mg by mouth daily. 09/10/22  Yes [provider]  atorvastatin  (LIPITOR) 10 MG tablet Take 1 tablet by mouth daily. 10/12/23 10/06/24 Yes [provider]  chlorthalidone (HYGROTON) 25 MG tablet Take 25 mg by mouth daily. 08/13/22  Yes [provider]  cyclobenzaprine  (FLEXERIL ) 10 MG tablet Take 10 mg by mouth at bedtime.   Yes [provider]  Docusate Sodium  (DSS) 100 MG CAPS Take 1 capsule by mouth daily. 10/12/23 10/11/24 Yes [provider]  DULoxetine  (CYMBALTA ) 60 MG capsule Take 1 capsule by mouth daily. 10/12/23  Yes [provider]  gabapentin  (NEURONTIN ) 300 MG capsule Take 300-1,500 mg by mouth daily. Take 300mg  (1 capsule) by mouth in the mornings and 1500mg  (5 capsules) in the evening. 08/03/16  Yes [provider]  GAVILAX 17 GM/SCOOP powder Take 17 g by mouth daily as needed for mild constipation or moderate constipation. 10/01/23  Yes [provider]  JARDIANCE  10 MG TABS tablet Take 10 mg by mouth daily. 08/09/22  Yes [provider]  losartan (COZAAR) 50 MG tablet Take 50 mg by mouth daily. 07/14/16  Yes [provider]  magnesium  30 MG tablet Take 30 mg by mouth in the morning.   Yes [provider]  methocarbamol  (ROBAXIN )  500 MG tablet Take 500 mg by mouth as needed for muscle spasms. 10/12/23 10/01/24 Yes [provider]  metoprolol  succinate (TOPROL -XL) 25 MG 24 hr tablet Take 1 tablet by mouth daily. 10/12/23  Yes [provider]  MULTAQ  400 MG tablet Take 400 mg by mouth 2 (two) times daily. 10/17/23  Yes [provider]  Multiple Vitamins-Minerals (MULTIVITAMIN WITH MINERALS) tablet Take 1 tablet by mouth daily.   Yes [provider]  potassium chloride  SA (KLOR-CON  M) 20 MEQ tablet Take 2 tablets (40 mEq total) by mouth daily. 10/24/23  Yes Vann, Jessica U, DO  Semaglutide  (RYBELSUS ) 7 MG TABS Take 1 tablet by mouth daily.   Yes [provider]  senna (SENOKOT) 8.6 MG tablet Take 1 tablet by mouth daily. 10/12/23 10/11/24 Yes [provider]  tamsulosin  (FLOMAX ) 0.4 MG CAPS capsule Take 0.4 mg by mouth daily. 09/28/22  Yes [provider]  traZODone  (DESYREL ) 50 MG tablet Take 50 mg by mouth at bedtime. 10/12/23 08/28/24 Yes [provider]  acetaminophen  (TYLENOL ) 325 MG tablet Take 1 tablet (325 mg total) by mouth every 6 (six) hours as needed for mild pain (or Fever >/= 101). Patient not taking: Reported on 12/13/2023 07/28/22   Elgergawy, Dawood S, MD  bacitracin ointment Apply 1 Application  topically as needed for wound care. 07/04/23 07/03/24  [provider]  BIOTIN PO Take 1 tablet by mouth daily.    [provider]  meloxicam  (MOBIC ) 7.5 MG tablet Take 1 tablet (7.5 mg total) by mouth daily. Patient not taking: Reported on 12/13/2023 11/21/23   Mesner, Selinda, MD  sulfamethoxazole -trimethoprim  (BACTRIM ) 400-80 MG tablet Take 1 tablet by mouth every 12 (twelve) hours. Patient not taking: Reported on 12/13/2023 10/23/23   Vann, Jessica U, DO    Scheduled Meds:  atorvastatin   10 mg Oral Daily   cyclobenzaprine   10 mg Oral QHS   DULoxetine   60 mg Oral Daily   empagliflozin   10 mg Oral Daily   enoxaparin  (LOVENOX ) injection  40 mg  Subcutaneous Q24H   gabapentin   1,500 mg Oral QHS   gabapentin   300 mg Oral Daily   insulin  aspart  0-15 Units Subcutaneous TID WC   metoprolol  succinate  25 mg Oral Daily   potassium chloride  SA  40 mEq Oral Daily   tamsulosin   0.4 mg Oral Daily   traZODone   50 mg Oral QHS   Continuous Infusions:  diltiazem  (CARDIZEM ) infusion 15 mg/hr (01/13/24 1159)   PRN Meds: acetaminophen  **OR** acetaminophen , prochlorperazine   Allergies:    Allergies  Allergen Reactions   Metformin Diarrhea and Nausea And Vomiting   Oxycodone Other (See Comments)    Constipation   Oxycodone-Acetaminophen      Constipation   Lisinopril Cough   Morphine Itching    Other reaction(s): Other (See Comments)  Constipation   Morphine And Codeine Itching    Social History:   Social History   Socioeconomic History   Marital status: Single    Spouse name: Not on file   Number of children: Not on file   Years of education: Not on file   Highest education level: Not on file  Occupational History   Not on file  Tobacco Use   Smoking status: Never   Smokeless tobacco: Never  Substance and Sexual Activity   Alcohol use: Never   Drug use: Never   Sexual activity: Not on file  Other Topics Concern   Not on file  Social History Narrative   Are you right handed or left handed? Right Handed    Are you currently employed ? No    What is your current occupation? Retired    Do you live at home alone? yes   Who lives with you? No one    What type of home do you live in: 1 story or 2 story? One story home       Social Drivers of Health   Financial Resource Strain: Low Risk  (08/29/2023)   Received from [facility]   Overall Financial Resource Strain (CARDIA)    Difficulty of Paying Living Expenses: Not hard at all  Food Insecurity: No Food Insecurity (01/13/2024)   Hunger Vital Sign    Worried About Running Out of Food in the Last Year: Never true    Ran Out of Food in the Last Year: Never true   Transportation Needs: No Transportation Needs (01/13/2024)   PRAPARE - Administrator, Civil Service (Medical): No    Lack of Transportation (Non-Medical): No  Physical Activity: Insufficiently Active (08/29/2023)   Received from [facility]   Exercise Vital Sign    On average, how many days per week do you engage in moderate to strenuous exercise (like a brisk walk)?: 3 days  On average, how many minutes do you engage in exercise at this level?: 30 min  Stress: Not on file  Social Connections: Unknown (01/13/2024)   Social Connection and Isolation Panel    Frequency of Communication with Friends and Family: Three times a week    Frequency of Social Gatherings with Friends and Family: Three times a week    Attends Religious Services: 1 to 4 times per year    Active Member of Clubs or Organizations: Patient declined    Attends Banker Meetings: 1 to 4 times per year    Marital Status: Patient declined  Intimate Partner Violence: Not At Risk (01/13/2024)   Humiliation, Afraid, Rape, and Kick questionnaire    Fear of Current or Ex-Partner: No    Emotionally Abused: No    Physically Abused: No    Sexually Abused: No    Family History:    Family History  Problem Relation Age of Onset   Dementia Mother    Alzheimer's disease Mother    Kidney Stones Father      ROS:  Please see the history of present illness.   All other ROS reviewed and negative.     Physical Exam/Data: Vitals:   01/13/24 1111 01/13/24 1139 01/13/24 1158 01/13/24 1317  BP: (!) 133/106 105/86 (!) 130/91 113/77  Pulse: (!) 141 (!) 140  93  Resp: 20 19  20   Temp:  98.8 F (37.1 C)  98.6 F (37 C)  TempSrc:  Oral  Oral  SpO2: 94% 95%  94%  Weight:      Height:       No intake or output data in the 24 hours ending 01/13/24 1511    01/13/2024    1:38 AM 11/20/2023    5:51 PM 10/22/2023    5:08 AM  Last 3 Weights  Weight (lbs) 240 lb 240 lb 241 lb 6.5 oz  Weight (kg) 108.863 kg  108.863 kg 109.5 kg     Body mass index is 34.44 kg/m.  General:  Well nourished, well developed, in no acute distress HEENT: normal Neck: no JVD Vascular: No carotid bruits; Distal pulses 2+ bilaterally Cardiac:  normal S1, S2; irregular and tachycardic; no murmur  Lungs:  clear to auscultation bilaterally, no wheezing, rhonchi or rales  Abd: soft, nontender, no hepatomegaly  Ext: no edema Musculoskeletal:  No deformities, BUE and BLE strength normal and equal Skin: warm and dry  Neuro:  CNs 2-12 intact, no focal abnormalities noted Psych:  Normal affect   EKG:  The EKG was personally reviewed and demonstrates:  Suspected atrial flutter 144bpm, nonspecific STTW changes  Telemetry:  Telemetry was personally reviewed and demonstrates:  aflutter 140s  Relevant CV Studies: 2d echo 10/2022   Summary   1. Technically difficult study.    2. Concentric left ventricular remodeling with normal global and segmental  systolic function (EF >55%).   3. Normal right ventricular size and systolic function.    4. Aortic sclerosis without evidence of significant stenosis or  regurgitation.    Laboratory Data: High Sensitivity Troponin:   Recent Labs  Lab 01/13/24 0305  TROPONINIHS 7     Chemistry Recent Labs  Lab 01/13/24 0305  NA 139  K 4.0  CL 99  CO2 28  GLUCOSE 181*  BUN 16  CREATININE 0.72  CALCIUM  10.1  MG 1.9  GFRNONAA >60  ANIONGAP 12    Recent Labs  Lab 01/13/24 1231  PROT 6.2*  ALBUMIN 2.9*  AST 13*  ALT 12  ALKPHOS 55  BILITOT 1.1   Lipids No results for input(s): CHOL, TRIG, HDL, LABVLDL, LDLCALC, CHOLHDL in the last 168 hours.  Hematology Recent Labs  Lab 01/13/24 0305  WBC 14.3*  RBC 5.50  HGB 14.3  HCT 45.9  MCV 83.5  MCH 26.0  MCHC 31.2  RDW 14.1  PLT 325   Thyroid   Recent Labs  Lab 01/13/24 1231  TSH 2.084    BNP Recent Labs  Lab 01/13/24 0305  BNP 72.7    DDimer No results for input(s): DDIMER in the last 168  hours.  Radiology/Studies:  ECHOCARDIOGRAM COMPLETE Result Date: 01/13/2024    ECHOCARDIOGRAM REPORT   Patient Name:   Ovidio THAMAS APPLEYARD Date of Exam: 01/13/2024 Medical Rec #:  [id number]        Height:       70.0 in Accession #:    [id number]       Weight:       240.0 lb Date of Birth:  01-04-1956         BSA:          2.255 m Patient Age:    68 years         BP:           109/58 mmHg Patient Gender: M                HR:           142 bpm. Exam Location:  Inpatient Procedure: 2D Echo, Cardiac Doppler, Color Doppler and Intracardiac            Opacification Agent (Both Spectral and Color Flow Doppler were            utilized during procedure). Indications:    I48.91* Unspeicified atrial fibrillation  History:        Patient has prior history of Echocardiogram examinations, most                 recent 07/22/2022. Abnormal ECG, Arrythmias:Atrial Fibrillation,                 Signs/Symptoms:Syncope; Risk Factors:Diabetes, Dyslipidemia and                 Sleep Apnea.  Sonographer:    Ellouise Mose RDCS Referring Phys: [id number] Josia MANUEL ORTIZ  Sonographer Comments: Technically difficult study due to poor echo windows. Image acquisition challenging due to patient body habitus and Image acquisition challenging due to respiratory motion. Extremely difficult study. Off axis apicals IMPRESSIONS  1. Left ventricular ejection fraction, by estimation, is 70 to 75%. Left ventricular ejection fraction by 2D MOD biplane is 72.6 %. The left ventricle has hyperdynamic function. The left ventricle has no regional wall motion abnormalities. There is mild  left ventricular hypertrophy. Left ventricular diastolic parameters are indeterminate.  2. Right ventricular systolic function is normal. The right ventricular size is mildly enlarged. Tricuspid regurgitation signal is inadequate for assessing PA pressure.  3. Moderate pericardial effusion, more adjacent to the RV - the IVC does not collapse, but there is no RA or RV collapse or  exaggerated inflow variation to suggest tamponade - clinical correlation is advised, especially given the elevated HR. Moderate pericardial effusion. The pericardial effusion is circumferential. There is no evidence of cardiac tamponade.  4. The mitral valve is grossly normal. Trivial mitral valve regurgitation. No evidence of mitral stenosis. Moderate mitral annular calcification.  5. The aortic valve is tricuspid. Aortic valve  regurgitation is not visualized. Aortic valve sclerosis is present, with no evidence of aortic valve stenosis.  6. The inferior vena cava is dilated in size with <50% respiratory variability, suggesting right atrial pressure of 15 mmHg. Comparison(s): Changes from prior study are noted. 07/22/2022: LVEF 60-65% (technically difficult study, no pericardial effusion noted). A new pericardial effuison is noted. Conclusion(s)/Recommendation(s): Consider limited echo to re-assess effusion in 2-3 days. FINDINGS  Left Ventricle: Left ventricular ejection fraction, by estimation, is 70 to 75%. Left ventricular ejection fraction by 2D MOD biplane is 72.6 %. The left ventricle has hyperdynamic function. The left ventricle has no regional wall motion abnormalities. Definity  contrast agent was given IV to delineate the left ventricular endocardial borders. The left ventricular internal cavity size was normal in size. There is mild left ventricular hypertrophy. Left ventricular diastolic parameters are indeterminate. Right Ventricle: The right ventricular size is mildly enlarged. No increase in right ventricular wall thickness. Right ventricular systolic function is normal. Tricuspid regurgitation signal is inadequate for assessing PA pressure. Left Atrium: Left atrial size was normal in size. Right Atrium: Right atrial size was normal in size. Pericardium: Moderate pericardial effusion, more adjacent to the RV - the IVC does not collapse, but there is no RA or RV collapse or exaggerated inflow variation to  suggest tamponade - clinical correlation is advised, especially given the elevated HR. A  moderately sized pericardial effusion is present. The pericardial effusion is circumferential. There is no evidence of cardiac tamponade. Mitral Valve: The mitral valve is grossly normal. Moderate mitral annular calcification. Trivial mitral valve regurgitation. No evidence of mitral valve stenosis. Tricuspid Valve: The tricuspid valve is normal in structure. Tricuspid valve regurgitation is trivial. No evidence of tricuspid stenosis. Aortic Valve: The aortic valve is tricuspid. Aortic valve regurgitation is not visualized. Aortic valve sclerosis is present, with no evidence of aortic valve stenosis. Pulmonic Valve: The pulmonic valve was normal in structure. Pulmonic valve regurgitation is not visualized. No evidence of pulmonic stenosis. Aorta: The aortic root and ascending aorta are structurally normal, with no evidence of dilitation. Venous: The inferior vena cava is dilated in size with less than 50% respiratory variability, suggesting right atrial pressure of 15 mmHg. IAS/Shunts: No atrial level shunt detected by color flow Doppler.  LEFT VENTRICLE PLAX 2D                        Biplane EF (MOD) LVIDd:         4.70 cm         LV Biplane EF:   Left LVIDs:         3.30 cm                          ventricular LV PW:         1.10 cm                          ejection LV IVS:        0.90 cm                          fraction by LVOT diam:     2.40 cm                          2D MOD LV SV:  36                               biplane is LV SV Index:   16                               72.6 %. LVOT Area:     4.52 cm  LV Volumes (MOD) LV vol d, MOD    85.8 ml A2C: LV vol d, MOD    79.9 ml A4C: LV vol s, MOD    21.8 ml A2C: LV vol s, MOD    20.3 ml A4C: LV SV MOD A2C:   64.0 ml LV SV MOD A4C:   79.9 ml LV SV MOD BP:    62.1 ml RIGHT VENTRICLE             IVC RV S prime:     12.20 cm/s  IVC diam: 2.50 cm TAPSE (M-mode): 1.3 cm  LEFT ATRIUM           Index        RIGHT ATRIUM           Index LA diam:      4.40 cm 1.95 cm/m   RA Area:     10.20 cm LA Vol (A2C): 40.8 ml 18.09 ml/m  RA Volume:   18.00 ml  7.98 ml/m LA Vol (A4C): 24.8 ml 11.00 ml/m  AORTIC VALVE LVOT Vmax:   70.60 cm/s LVOT Vmean:  43.800 cm/s LVOT VTI:    0.080 m  AORTA Ao Root diam: 3.40 cm Ao Asc diam:  3.40 cm MITRAL VALVE MV Area (PHT): 6.17 cm    SHUNTS MV Decel Time: 123 msec    Systemic VTI:  0.08 m MV E velocity: 69.50 cm/s  Systemic Diam: 2.40 cm MV A velocity: 48.10 cm/s MV E/A ratio:  1.44 Vinie Maxcy MD Electronically signed by Vinie Maxcy MD Signature Date/Time: 01/13/2024/1:56:46 PM    Final    DG Chest Port 1 View Result Date: 01/13/2024 CLINICAL DATA:  Atrial fibrillation with rapid ventricular response EXAM: PORTABLE CHEST 1 VIEW COMPARISON:  None Available. FINDINGS: Low lung volumes. No pulmonary edema, pleural effusion or pneumothorax. No focal airspace infiltrate. Calcifications present in the transverse aorta consistent with aortic atherosclerotic vascular calcifications. IMPRESSION: Low lung volumes without evidence of acute cardiopulmonary process. Aortic Atherosclerosis (ICD10-I70.0). Electronically Signed   By: Wilkie Lent M.D.   On: 01/13/2024 08:26   DG Knee 2 Views Right Result Date: 01/13/2024 CLINICAL DATA:  Right knee pain EXAM: RIGHT KNEE - 1-2 VIEW COMPARISON:  None Available. FINDINGS: Frontal lateral radiographs are slightly limited by obliquity. Normal alignment. No acute fracture or dislocation. Joint spaces appear preserved on this limited examination. No effusion. Advanced vascular calcification. IMPRESSION: 1. No acute fracture or dislocation. 2. Advanced vascular calcification. Electronically Signed   By: Dorethia Molt M.D.   On: 01/13/2024 02:16     Assessment and Plan:  1. 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 Persistent atrial flutter with RVR, wth reported hx of PAF/?PSVT, not on anticoagulation prior to admission due to frequent falls - started on diltiazem  drip, continue.  - challenging situation. Patient is poor historian, but appears asymptomatic from fast rates, likely long standing due to  similar EKG 1 mo ago. Need to consider anticoagulation for further rhythm control options but patient defers. If patient is unstable, can use IV amiodarone , however if remains clinically stable, IV amiodarone  should be deferred given subclinical hypothyroidism and lack of anticoagulation, as amio could precipitate cardioversion off of anticoagulation.  - on multaq  at home but remains in atrial flutter likely >1 mo. For now will hold and discuss other options.  - will hold toprol  and use lopressor  25 mg TID.  3. H/o subclinical hypothyroidism with normal TSH in 10/2023 - TSH added to labs with free T4 in AM  4. Mild nonobstructive CAD 2019, aortic atherosclerosis - negative troponins x2  5. Hyperlipidemia - on atorvastatin   Risk Assessment/Risk Scores:         CHA2DS2-VASc Score = 4   This indicates a 4.8% annual risk of stroke. The patient's score is based upon: CHF History: 0 HTN History: 1 Diabetes History: 1 Stroke History: 0 Vascular Disease History: 1 (mild CAD, aortic atherosclerosis) Age Score: 1 Gender Score: 0        For questions or updates, please contact [facility] Please consult [email or web address] for contact info under    Signed, Soyla DELENA Merck, MD  01/13/2024 3:11 PM

## 2024-01-13 NOTE — ED Provider Notes (Signed)
 [place] [facility] AT [facility] Provider Note   CSN: [id number] Arrival date & time: 01/13/24  0124     Patient presents with: Knee Pain   Alan Lin is a 68 y.o. male.   The history is provided by the patient.  Knee Pain  He has history of hypertension, diabetes, paroxysmal atrial fibrillation not on anticoagulation because of frequent falls and comes in because of right knee pain.  Patient is a very poor historian.  He states that he fell in the beginning of May but cannot tell me why he came into the emergency department tonight.    Prior to Admission medications   Medication Sig Start Date End Date Taking? Authorizing Provider  acetaminophen  (TYLENOL ) 325 MG tablet Take 1 tablet (325 mg total) by mouth every 6 (six) hours as needed for mild pain (or Fever >/= 101). Patient not taking: Reported on 12/13/2023 07/28/22   Elgergawy, Brayton RAMAN, MD  amLODipine  (NORVASC ) 10 MG tablet Take 10 mg by mouth daily. 09/10/22   [provider]  atorvastatin  (LIPITOR) 10 MG tablet Take 1 tablet by mouth daily. 10/12/23 10/06/24  [provider]  bacitracin ointment Apply 1 Application topically as needed for wound care. 07/04/23 07/03/24  [provider]  BIOTIN PO Take 1 tablet by mouth daily.    [provider]  chlorthalidone (HYGROTON) 25 MG tablet Take 25 mg by mouth daily. 08/13/22   [provider]  cyclobenzaprine  (FLEXERIL ) 10 MG tablet Take 10 mg by mouth at bedtime.    [provider]  Docusate Sodium  (DSS) 100 MG CAPS Take 1 capsule by mouth daily. 10/12/23 10/11/24  [provider]  DULoxetine  (CYMBALTA ) 60 MG capsule Take 1 capsule by mouth daily. 10/12/23   [provider]  gabapentin  (NEURONTIN ) 300 MG capsule Take 300-1,500 mg by mouth daily. Take 300mg  (1 capsule) by mouth in the mornings and 1500mg  (5 capsules) in the evening. 08/03/16   [provider]  GAVILAX 17 GM/SCOOP  powder Take 17 g by mouth daily as needed for mild constipation or moderate constipation. 10/01/23   [provider]  JARDIANCE  10 MG TABS tablet Take 10 mg by mouth daily. 08/09/22   [provider]  losartan (COZAAR) 50 MG tablet Take 50 mg by mouth daily. 07/14/16   [provider]  magnesium  30 MG tablet Take 30 mg by mouth in the morning.    [provider]  meloxicam  (MOBIC ) 7.5 MG tablet Take 1 tablet (7.5 mg total) by mouth daily. Patient not taking: Reported on 12/13/2023 11/21/23   Mesner, Selinda, MD  methocarbamol  (ROBAXIN ) 500 MG tablet Take 500 mg by mouth as needed for muscle spasms. 10/12/23 10/01/24  [provider]  metoprolol  succinate (TOPROL -XL) 25 MG 24 hr tablet Take 1 tablet by mouth daily. 10/12/23   [provider]  MULTAQ  400 MG tablet Take 400 mg by mouth 2 (two) times daily. 10/17/23   [provider]  Multiple Vitamins-Minerals (MULTIVITAMIN WITH MINERALS) tablet Take 1 tablet by mouth daily.    [provider]  potassium chloride  SA (KLOR-CON  M) 20 MEQ tablet Take 2 tablets (40 mEq total) by mouth daily. 10/24/23   Vann, Jessica U, DO  Semaglutide  (RYBELSUS ) 7 MG TABS Take 1 tablet by mouth daily.    [provider]  sulfamethoxazole -trimethoprim  (BACTRIM ) 400-80 MG tablet Take 1 tablet by mouth every 12 (twelve) hours. Patient not taking: Reported on 12/13/2023 10/23/23   Vann,  Jessica U, DO  tamsulosin  (FLOMAX ) 0.4 MG CAPS capsule Take 0.4 mg by mouth daily. 09/28/22   [provider]  traZODone  (DESYREL ) 50 MG tablet Take 50 mg by mouth at bedtime. 10/12/23 08/28/24  [provider]    Allergies: Metformin, Oxycodone, Oxycodone-acetaminophen , Lisinopril, Morphine, and Morphine and codeine    Review of Systems  All other systems reviewed and are negative.   Updated Vital Signs BP (!) 124/91 (BP Location: Left Arm)   Pulse (!) 147   Temp 98.3 F (36.8 C) (Oral)   Resp 18   Ht  5' 10 (1.778 m)   Wt 108.9 kg   SpO2 94%   BMI 34.44 kg/m   Physical Exam Vitals and nursing note reviewed.   68 year old male, resting comfortably and in no acute distress. Vital signs are significant for borderline elevated blood pressure and markedly elevated heart rate. Oxygen  saturation is 94%, which is normal. Head is normocephalic and atraumatic. PERRLA, EOMI.  Neck is nontender and supple without adenopathy or JVD. Lungs are clear without rales, wheezes, or rhonchi. Chest is nontender. Heart is tachycardic and irregular without murmur. Abdomen is soft, flat, nontender. Extremities: There is mild soft tissue swelling around the right knee without any erythema or warmth.  The right knee is diffusely tender to palpation.  There is pain on any passive range of motion.  There is some laxity noted on Lachman test, McMurray's test is grossly normal although limited by pain.  There is no instability on valgus or varus stress. Skin is warm and dry without rash. Neurologic: Awake and alert and objectively oriented but very slow to answer questions.  Cranial nerves are grossly intact.  Moves all extremities equally.  (all labs ordered are listed, but only abnormal results are displayed) Labs Reviewed  BASIC METABOLIC PANEL WITH GFR - Abnormal; Notable for the following components:      Result Value   Glucose, Bld 181 (*)    All other components within normal limits  CBC WITH DIFFERENTIAL/PLATELET - Abnormal; Notable for the following components:   WBC 14.3 (*)    Neutro Abs 11.4 (*)    Monocytes Absolute 1.3 (*)    Abs Immature Granulocytes 0.09 (*)    All other components within normal limits  TROPONIN I (HIGH SENSITIVITY)    EKG: EKG Interpretation Date/Time:  Saturday January 13 2024 02:59:54 EDT Ventricular Rate:  144 PR Interval:  77 QRS Duration:  93 QT Interval:  303 QTC Calculation: 469 R Axis:   81  Text Interpretation: Atrial fibrillation with rapid ventricular  response Borderline right axis deviation Repolarization abnormality, prob rate related When compared with ECG of 12/13/2023, No significant change was found Confirmed by Raford Lenis ([id number]) on 01/13/2024 3:38:33 AM  Radiology: [facility] Knee 2 Views Right Result Date: 01/13/2024 CLINICAL DATA:  Right knee pain EXAM: RIGHT KNEE - 1-2 VIEW COMPARISON:  None Available. FINDINGS: Frontal lateral radiographs are slightly limited by obliquity. Normal alignment. No acute fracture or dislocation. Joint spaces appear preserved on this limited examination. No effusion. Advanced vascular calcification. IMPRESSION: 1. No acute fracture or dislocation. 2. Advanced vascular calcification. Electronically Signed   By: Dorethia Molt M.D.   On: 01/13/2024 02:16     Procedures   Medications Ordered in the ED  metoprolol  tartrate (LOPRESSOR ) injection 5 mg (5 mg Intravenous Given 01/13/24 0355)  metoprolol  tartrate (LOPRESSOR ) injection 5 mg (5 mg Intravenous Given 01/13/24 0436)  Medical Decision Making Amount and/or Complexity of Data Reviewed Labs: ordered. Radiology: ordered.  Risk Prescription drug management. Decision regarding hospitalization.   Right knee pain which at this point would be subacute, based on patient's history.  Exam is concerning for possible ACL injury.  Heart is tachycardic and irregular concerning for paroxysmal atrial fibrillation.  I asked the patient if he was aware that he was in a rapid heart rate and he states he was up and has been since he last saw his cardiologist.  I reviewed his past records, and last cardiology office visit was on 09/01/2023 at which time his heart rate was 94 with no report of ECG being done.  I have ordered electrocardiogram as well as screening labs of CBC and basic metabolic panel and troponin.  Knee x-ray shows no acute fracture or dislocation, advanced vascular calcification noted.  I have independently viewed the images,  and agree with the radiologist's interpretation.  Please note that with uncertainty of when the patient went into atrial fibrillation, he would not be a candidate for electrical cardioversion.  3:42 AM I have reviewed his electrocardiogram, my interpretation is atrial fibrillation/atrial flutter with rapid ventricular response, repolarization changes likely secondary to rapid rate.  This is not significantly changed from prior ECG on 12/13/2023.  He is already on a calcium  channel blocker, I have ordered intravenous metoprolol  5 mg.  On review of past records, I note during ED visit on 12/13/2023 he was identified as being in atrial fibrillation with rapid ventricular response which only partly responded to metoprolol  and hospital admission was recommended but patient insisted on leaving.  He is currently on metoprolol , but a relatively low dose.  He had no response to metoprolol .  I ordered a second dose of metoprolol  5 mg, and his heart rate did not change.  I was concerned that his blood pressure was borderline and that he might not tolerate diltiazem .  I reviewed his laboratory tests, my interpretation is normal electrolytes, normal troponin, mild leukocytosis which is nonspecific, normal hemoglobin.  I feel he needs to be admitted for cardiology consultation to try to achieve appropriate rate control.  I have paged cardiology, but have not heard back.  I have discussed case with Dr. Sundil of [facility], who agrees to admit the patient.  CRITICAL CARE Performed by: Alm Lias Total critical care time: 65 minutes Critical care time was exclusive of separately billable procedures and treating other patients. Critical care was necessary to treat or prevent imminent or life-threatening deterioration. Critical care was time spent personally by me on the following activities: development of treatment plan with patient and/or surrogate as well as nursing, discussions with consultants, evaluation of  patient's response to treatment, examination of patient, obtaining history from patient or surrogate, ordering and performing treatments and interventions, ordering and review of laboratory studies, ordering and review of radiographic studies, pulse oximetry and re-evaluation of patient's condition.     Final diagnoses:  Atrial fibrillation with rapid ventricular response (HCC)  Right knee pain, unspecified chronicity    ED Discharge Orders     None          Lias Alm, MD 01/13/24 [phone]

## 2024-01-13 NOTE — ED Triage Notes (Signed)
  Patient BIB EMS from [facility] assisted living R knee pain.  Patient states it was hurting earlier today so he took a nap and thought that might help.  Patient woke up with leg hanging off bed and was unable to ambulate.  States he has fallen twice in the last year and has issues with that R knee.  Pain 7/10, tender/sharp.

## 2024-01-13 NOTE — Progress Notes (Signed)
 MD notified with a Final update. pt HR 110-126's max. Cardiology rounded. metoprolol  given. pt cont to be stable and asymptotic.

## 2024-01-13 NOTE — Hospital Course (Signed)
 Alan Lin is a 68 y.o. male with past medical history of paroxysmal atrial fibrillation significant of osteoarthritis, type 2 diabetes, hyperlipidemia,  hypertension, atrial fibrillation, BPH, degeneration of lumbar intervertebral disc, cervical DDD, cervical radiculitis, history of falls, obstructive sleep apnea was brought from his facility to the emergency department due to knee pain and right lower extremity pain..  In the ED initial vitals were notable for tachycardia.  Labs showed CBC with WBC of 14.3. Troponin level was normal.  BNP 72.7 pg/mL.  BMP was unremarkable with the exception of glucose of 181 mg/dL. Phosphorus 3.2 and magnesium  1.9 mg/dL. Right knee x-ray showed no acute fracture or dislocation.  Chest x-ray without any infiltrate.  [facility] was in [facility] for [facility] evaluation and treatment.    Persistent atrial flutter with RVR CHA?DS?-VASc Score of at least 4.  Currently on Cardizem  drip.  Not a candidate for anticoagulation.  On metoprolol  p.o. 3 times daily as well.  2D echocardiogram from 01/13/2024 with LV ejection fraction of 70 to 75% with no regional wall motion abnormalities. Cardiology has been consulted.  Unable to use amiodarone  due to subclinical hypothyroidism.  Right knee pain.  History of falls. Continue supportive care.  PT evaluation.  Subclinical hypothyroidism.  Normal TSH 10/2023.  Check free T4.  TSH of 2.0.  Mild hypokalemia.   Potassium 3.4 today.  Will replenish orally.  Check levels in AM.   Anxiety/  Depressive disorder Continue Cymbalta  and trazodone      DDD (degenerative disc disease), cervical and lumbar On gabapentin  and duloxetine .  Will continue     Primary osteoarthritis of both knees Tylenol  and Toradol .  PT when he feels better.  Declined narcotic due to constipation.    Hyperlipidemia Continue Lipitor     Essential hypertension Continue metoprolol .  Has been started on Cardizem  so we will hold off with amlodipine . Will  likely go home with long-acting Cardizem .  Blood pressure seems to be stable at this time.    OSA (obstructive sleep apnea) CPAP at bedtime.     BPH (benign prostatic hyperplasia) Continue Flomax      Diabetic neuropathy  Continue gabapentin  and duloxetine      Type 2 diabetes mellitus with hyperglycemia  Continue sliding scale insulin  Jardiance .  Latest POC glucose of 2 1    HPI: [name] is a 68 y.o. male with medical history significant of osteoarthritis, type 2 diabetes, hyperlipidemia, hypomagnesemia, hypophosphatemia, hypertension, atrial fibrillation, BPH, degeneration of lumbar intervertebral disc, cervical DDD, cervical radiculitis, history of falls, obstructive sleep apnea, anxiety, history of sepsis, history of syncope, paroxysmal atrial fibrillation who was brought from his facility to the emergency department due to knee pain. The patient stated that he woke up in his RLE was out of position and very painful.  He has mostly resolved since then. He endorses occasional palpitations, but no chest pain, diaphoresis, PND, orthopnea or pitting edema of the lower extremities. He has had generalized weakness and knees at the facility getting physical therapy. He has stated that his right knee and RLE in general are weak.  He denied fever, chills, rhinorrhea, sore throat, wheezing or hemoptysis. No abdominal pain, nausea, emesis, diarrhea, constipation, melena or hematochezia. No flank pain, dysuria, frequency or hematuria. No polyuria, polydipsia, polyphagia or blurred vision.   ED course: Initial vital signs were temperature 98.3 F, pulse 147, respiration 18, BP 124/91 mmHg O2 sat 94% on room air.  The patient received metoprolol  5 mg IVP at 0355 and at  0 436 without significant response.   Significant Events: Admitted 01/13/2024 for rapid atrial fibrillation. Cards consulted for management of afib 01-13-2024 pericardial effusion found on echo 01-15-2024 pt refuses systemic  anticoagulation. Therefore DCCV for his afib is not an option. 01-16-2024 pt now agreeable to Eliquis  bid. pt has demonstrated that he has failed Multaq  Amiodarone  discussed in past Though to avoid due to borderline hypothyroidism   Admission Labs: white count of 14.3, hemoglobin 14.3 g/dL and platelets 674.   Troponin level was normal.   BNP 72.7 pg/mL.   BMP was unremarkable with the exception of glucose of 181 mg/dL. Phosphorus 3.2 and magnesium  1.9 mg/dL.   Admission Imaging Studies: Right knee x-ray showed no acute fracture or dislocation.  There was advanced vascular calcification.   Portable 1 view chest radiograph showing low lung volumes without evidence of acute cardiopulmonary process.  There is aortic atherosclerosis.   Significant Labs:   Significant Imaging Studies: 01-13-2024 echo shows LVEF 70-75%.  Moderate pericardial effusion. No tamponade.   Antibiotic Therapy: Anti-infectives (From admission, onward)    None       Procedures:   Consultants: cardiology

## 2024-01-13 NOTE — H&P (Signed)
 History and Physical    Patient: Alan Lin FMW:[id number] DOB: 19-Jul-1955 DOA: 01/13/2024 DOS: the patient was seen and examined on 01/13/2024 PCP: Agustina Greig NOVAK, MD  Patient coming from: Home  Chief Complaint:  Chief Complaint  Patient presents with   Knee Pain   HPI: Alan Lin is a 68 y.o. male with medical history significant of osteoarthritis, type 2 diabetes, hyperlipidemia, hypomagnesemia, hypophosphatemia, hypertension, atrial fibrillation, BPH, degeneration of lumbar intervertebral disc, cervical DDD, cervical radiculitis, history of falls, obstructive sleep apnea, anxiety, history of sepsis, history of syncope, paroxysmal atrial fibrillation who was brought from his facility to the emergency department due to knee pain. The patient stated that he woke up in his RLE was out of position and very painful.  He has mostly resolved since then. He endorses occasional palpitations, but no chest pain, diaphoresis, PND, orthopnea or pitting edema of the lower extremities. He has had generalized weakness and knees at the facility getting physical therapy. He has stated that his right knee and RLE in general are weak.  He denied fever, chills, rhinorrhea, sore throat, wheezing or hemoptysis. No abdominal pain, nausea, emesis, diarrhea, constipation, melena or hematochezia. No flank pain, dysuria, frequency or hematuria. No polyuria, polydipsia, polyphagia or blurred vision.   Lab work: CBC showed a white count of 14.3, hemoglobin 14.3 g/dL and platelets 674.  Troponin level was normal.  BNP 72.7 pg/mL.  BMP was unremarkable with the exception of glucose of 181 mg/dL. Phosphorus 3.2 and magnesium  1.9 mg/dL.  Imaging: Right knead x-ray showed no acute fracture or dislocation.  There was advanced vascular calcification.  Portable 1 view chest radiograph showing low lung volumes without evidence of acute cardiopulmonary process.  There is aortic atherosclerosis.   ED course: Initial vital signs  were temperature 98.3 F, pulse 147, respiration 18, BP 124/91 mmHg O2 sat 94% on room air.  The patient received metoprolol  5 mg IVP at 0355 and at 0 436 without significant response.  Review of Systems: As mentioned in the history of present illness. All other systems reviewed and are negative. Past Medical History:  Diagnosis Date   Arthritis    Diabetes mellitus without complication (HCC)    Hypertension    Paroxysmal atrial fibrillation (HCC)    Sleep apnea    History reviewed. No pertinent surgical history. Social History:  reports that he has never smoked. He has never used smokeless tobacco. He reports that he does not drink alcohol and does not use drugs.  Allergies  Allergen Reactions   Metformin Diarrhea and Nausea And Vomiting   Oxycodone Other (See Comments)    Constipation   Oxycodone-Acetaminophen      Constipation   Lisinopril Cough   Morphine Itching    Other reaction(s): Other (See Comments)  Constipation   Morphine And Codeine Itching    Family History  Problem Relation Age of Onset   Dementia Mother    Alzheimer's disease Mother    Kidney Stones Father     Prior to Admission medications   Medication Sig Start Date End Date Taking? Authorizing Provider  amLODipine  (NORVASC ) 10 MG tablet Take 10 mg by mouth daily. 09/10/22  Yes [provider]  atorvastatin  (LIPITOR) 10 MG tablet Take 1 tablet by mouth daily. 10/12/23 10/06/24 Yes [provider]  chlorthalidone (HYGROTON) 25 MG tablet Take 25 mg by mouth daily. 08/13/22  Yes [provider]  cyclobenzaprine  (FLEXERIL ) 10 MG tablet Take 10 mg by mouth at bedtime.  Yes [provider]  Docusate Sodium  (DSS) 100 MG CAPS Take 1 capsule by mouth daily. 10/12/23 10/11/24 Yes [provider]  DULoxetine  (CYMBALTA ) 60 MG capsule Take 1 capsule by mouth daily. 10/12/23  Yes [provider]  gabapentin  (NEURONTIN ) 300 MG capsule Take 300-1,500 mg by mouth daily. Take  300mg  (1 capsule) by mouth in the mornings and 1500mg  (5 capsules) in the evening. 08/03/16  Yes [provider]  GAVILAX 17 GM/SCOOP powder Take 17 g by mouth daily as needed for mild constipation or moderate constipation. 10/01/23  Yes [provider]  JARDIANCE  10 MG TABS tablet Take 10 mg by mouth daily. 08/09/22  Yes [provider]  losartan (COZAAR) 50 MG tablet Take 50 mg by mouth daily. 07/14/16  Yes [provider]  magnesium  30 MG tablet Take 30 mg by mouth in the morning.   Yes [provider]  methocarbamol  (ROBAXIN ) 500 MG tablet Take 500 mg by mouth as needed for muscle spasms. 10/12/23 10/01/24 Yes [provider]  metoprolol  succinate (TOPROL -XL) 25 MG 24 hr tablet Take 1 tablet by mouth daily. 10/12/23  Yes [provider]  MULTAQ  400 MG tablet Take 400 mg by mouth 2 (two) times daily. 10/17/23  Yes [provider]  Multiple Vitamins-Minerals (MULTIVITAMIN WITH MINERALS) tablet Take 1 tablet by mouth daily.   Yes [provider]  potassium chloride  SA (KLOR-CON  M) 20 MEQ tablet Take 2 tablets (40 mEq total) by mouth daily. 10/24/23  Yes Vann, Jessica U, DO  Semaglutide  (RYBELSUS ) 7 MG TABS Take 1 tablet by mouth daily.   Yes [provider]  senna (SENOKOT) 8.6 MG tablet Take 1 tablet by mouth daily. 10/12/23 10/11/24 Yes [provider]  tamsulosin  (FLOMAX ) 0.4 MG CAPS capsule Take 0.4 mg by mouth daily. 09/28/22  Yes [provider]  traZODone  (DESYREL ) 50 MG tablet Take 50 mg by mouth at bedtime. 10/12/23 08/28/24 Yes [provider]  acetaminophen  (TYLENOL ) 325 MG tablet Take 1 tablet (325 mg total) by mouth every 6 (six) hours as needed for mild pain (or Fever >/= 101). Patient not taking: Reported on 12/13/2023 07/28/22   Elgergawy, Dawood S, MD  bacitracin ointment Apply 1 Application topically as needed for wound care. 07/04/23 07/03/24  [provider]  BIOTIN PO Take  1 tablet by mouth daily.    [provider]  meloxicam  (MOBIC ) 7.5 MG tablet Take 1 tablet (7.5 mg total) by mouth daily. Patient not taking: Reported on 12/13/2023 11/21/23   Mesner, Selinda, MD  sulfamethoxazole -trimethoprim  (BACTRIM ) 400-80 MG tablet Take 1 tablet by mouth every 12 (twelve) hours. Patient not taking: Reported on 12/13/2023 10/23/23   Juvenal Harlene PENNER, DO    Physical Exam: Vitals:   01/13/24 0415 01/13/24 0515 01/13/24 0607 01/13/24 0730  BP: (!) 135/91 106/70 122/66 (!) 109/58  Pulse: (!) 140 (!) 139 (!) 138 (!) 141  Resp: 20 20 16  (!) 21  Temp:   98.1 F (36.7 C)   TempSrc:   Oral   SpO2: 92% 94% 99% 94%  Weight:      Height:       Physical Exam Vitals and nursing note reviewed.  Constitutional:      General: He is awake. He is not in acute distress.    Appearance: He is ill-appearing.  HENT:     Head: Normocephalic.     Nose: No rhinorrhea.     Mouth/Throat:     Mouth: Mucous membranes  are moist.  Eyes:     General: No scleral icterus.    Pupils: Pupils are equal, round, and reactive to light.  Neck:     Vascular: No JVD.  Cardiovascular:     Rate and Rhythm: Tachycardia present. Rhythm irregular.     Heart sounds: S1 normal and S2 normal.  Pulmonary:     Effort: Pulmonary effort is normal.     Breath sounds: Normal breath sounds. No wheezing, rhonchi or rales.  Abdominal:     General: Bowel sounds are normal. There is no distension.     Palpations: Abdomen is soft.     Tenderness: There is no abdominal tenderness. There is no right CVA tenderness or left CVA tenderness.  Musculoskeletal:     Cervical back: Neck supple.     Right knee: Tenderness present.     Right lower leg: No edema.     Left lower leg: No edema.     Comments: Moderate generalized weakness.  Skin:    General: Skin is warm and dry.  Neurological:     General: No focal deficit present.     Mental Status: He is alert.  Psychiatric:        Mood and Affect: Mood normal.         Behavior: Behavior normal. Behavior is cooperative.     Data Reviewed:  Results are pending, will review when available. 08/13/2022 echocardiogram report. IMPRESSIONS:   1. Windows not well visualized. Cannot assess wall motion. Left  ventricular ejection fraction, by estimation, is 60 to 65%. The left  ventricle has normal function.   2. Right ventricular systolic function is normal. The right ventricular  size is normal.   3. No evidence of mitral valve regurgitation.   4. Aortic valve regurgitation is not visualized.   5. The inferior vena cava is normal in size with greater than 50%  respiratory variability, suggesting right atrial pressure of 3 mmHg.   EKG: Vent. rate 144 BPM PR interval 77 ms QRS duration 93 ms QT/QTcB 303/469 ms P-R-T axes 47 81 256 Atrial fibrillation with rapid ventricular response Borderline right axis deviation Repolarization abnormality, prob rate related  Assessment and Plan: Principal Problem:   Atrial fibrillation with RVR (HCC) CHA?DS?-VASc Score of at least 4. Age, DM, hypertension and CV disease. Does not have signs of acute heart failure. Observation/PCU. Not a candidate for anticoagulation. Trial diltiazem  15 mg IVP. Continue beta-blocker daily. Normal saline 1 L IV bolus. Magnesium  sulfate 2 g IVPB. Keep electrolytes optimized. Check transthoracic echocardiogram once HR slower. Cardiology consult requested for this weekend.  Active Problems:   Anxiety   Depressive disorder Continue duloxetine  60 mg p.o. daily. Continue trazodone  50 mg p.o. at bedtime.    DDD (degenerative disc disease), cervical   Degeneration of lumbar intervertebral disc On gabapentin  and duloxetine .    Primary osteoarthritis of both knees Acetaminophen  as needed. Declined narcotics due to constipation. Toradol  15 mg IVP as needed. Physical therapy evaluation once A-fib under control.    Hyperlipidemia Continue atorvastatin  10 mg p.o.  daily.    Essential hypertension Continue metoprolol  succinate 25 mg p.o. daily. Will discontinue amlodipine  10 mg p.o. daily. Will will begin long-acting diltiazem  once rate control. Hold losartan until rate control achieved. Diuretic was held earlier this year. Monitor blood pressure closely.    OSA (obstructive sleep apnea) CPAP at bedtime.    BPH (benign prostatic hyperplasia) Continue tamsulosin  0.4 mg p.o. daily.    Diabetic neuropathy (HCC) Continue  duloxetine  as above. Continue gabapentin  300 mg in the a.m. and 1200 mg in the evening.    Type 2 diabetes mellitus with hyperglycemia (HCC) Carbohydrate modified diet. Normal saline 1 L bolus. Continue Jardiance  10 mg p.o. daily. Continue semaglutide  7 mg p.o. daily. CBG monitoring with RI SS.     Advance Care Planning:   Code Status: Full Code   Consults:   Family Communication:   Severity of Illness: The appropriate patient status for this patient is OBSERVATION. Observation status is judged to be reasonable and necessary in order to provide the required intensity of service to ensure the patient's safety. The patient's presenting symptoms, physical exam findings, and initial radiographic and laboratory data in the context of their medical condition is felt to place them at decreased risk for further clinical deterioration. Furthermore, it is anticipated that the patient will be medically stable for discharge from the hospital within 2 midnights of admission.   Author: Alm Dorn Castor, MD 01/13/2024 7:45 AM  For on call review [email or web address].   This document was prepared using Dragon voice recognition software and may contain some unintended transcription errors.

## 2024-01-13 NOTE — Plan of Care (Signed)
Cont with plan of care

## 2024-01-14 DIAGNOSIS — I4891 Unspecified atrial fibrillation: Secondary | ICD-10-CM | POA: Diagnosis not present

## 2024-01-14 LAB — BASIC METABOLIC PANEL WITH GFR
Anion gap: 11 (ref 5–15)
BUN: 24 mg/dL — ABNORMAL HIGH (ref 8–23)
CO2: 26 mmol/L (ref 22–32)
Calcium: 9.4 mg/dL (ref 8.9–10.3)
Chloride: 100 mmol/L (ref 98–111)
Creatinine, Ser: 0.9 mg/dL (ref 0.61–1.24)
GFR, Estimated: >60 mL/min (ref >60)
Glucose, Bld: 173 mg/dL — ABNORMAL HIGH (ref 70–99)
Potassium: 3.4 mmol/L — ABNORMAL LOW (ref 3.5–5.1)
Sodium: 137 mmol/L (ref 135–145)

## 2024-01-14 LAB — CBC
HCT: 46.4 % (ref 39.0–52.0)
Hemoglobin: 14.5 g/dL (ref 13.0–17.0)
MCH: 26.6 pg (ref 26.0–34.0)
MCHC: 31.3 g/dL (ref 30.0–36.0)
MCV: 85 fL (ref 80.0–100.0)
Platelets: 332 K/uL (ref 150–400)
RBC: 5.46 MIL/uL (ref 4.22–5.81)
RDW: 14.5 % (ref 11.5–15.5)
WBC: 12.8 K/uL — ABNORMAL HIGH (ref 4.0–10.5)
nRBC: 0 % (ref 0.0–0.2)

## 2024-01-14 LAB — GLUCOSE, CAPILLARY
Glucose-Capillary: 177 mg/dL — ABNORMAL HIGH (ref 70–99)
Glucose-Capillary: 178 mg/dL — ABNORMAL HIGH (ref 70–99)
Glucose-Capillary: 192 mg/dL — ABNORMAL HIGH (ref 70–99)
Glucose-Capillary: 176 mg/dL — ABNORMAL HIGH (ref 70–99)

## 2024-01-14 LAB — T4, FREE: Free T4: 1.02 ng/dL (ref 0.61–1.12)

## 2024-01-14 LAB — HIV ANTIBODY (ROUTINE TESTING W REFLEX): HIV Screen 4th Generation wRfx: NONREACTIVE

## 2024-01-14 MED ORDER — METOPROLOL TARTRATE 50 MG PO TABS
50.0000 mg | ORAL_TABLET | Freq: Three times a day (TID) | ORAL | Status: DC
  Administered 2024-01-14 – 2024-01-16 (×7): 50 mg via ORAL
  Filled 2024-01-14 (×7): qty 1

## 2024-01-14 MED ORDER — POTASSIUM CHLORIDE CRYS ER 20 MEQ PO TBCR
40.0000 meq | EXTENDED_RELEASE_TABLET | Freq: Two times a day (BID) | ORAL | Status: AC
  Administered 2024-01-14 (×2): 40 meq via ORAL
  Filled 2024-01-14 (×3): qty 2

## 2024-01-14 NOTE — Progress Notes (Signed)
   01/14/24 0056  BiPAP/CPAP/SIPAP  $ Non-Invasive Home Ventilator  Initial  $ Face Mask Medium Yes  BiPAP/CPAP/SIPAP Pt Type Adult  BiPAP/CPAP/SIPAP Resmed  Mask Type Full face mask  Dentures removed? Not applicable  Mask Size Medium  EPAP 14 cmH2O  FiO2 (%) 21 %  Patient Home Machine No  Patient Home Mask No  Patient Home Tubing No  Auto Titrate No  CPAP/SIPAP surface wiped down Yes  Device Plugged into RED Power Outlet Yes  BiPAP/CPAP /SiPAP Vitals  Pulse Rate 92  Resp 18  SpO2 93 %  MEWS Score/Color  MEWS Score 0  MEWS Score Color Landy

## 2024-01-14 NOTE — Progress Notes (Signed)
  Progress Note  Patient Name: Alan Lin Date of Encounter: 01/14/2024 [facility] Cardiologist: None [facility]  Interval Summary   Remains in Aflutter, variable conduction, but rates better with addition of metoprolol  TID. Will increase dose today.   Vital Signs Vitals:   01/13/24 2032 01/13/24 2205 01/14/24 0056 01/14/24 0455  BP: 131/70   119/73  Pulse: 94 97 92 91  Resp: 20  18 19   Temp: 98.5 F (36.9 C)   98.3 F (36.8 C)  TempSrc: Oral   Axillary  SpO2: 94%  93% 98%  Weight:      Height:        Intake/Output Summary (Last 24 hours) at 01/14/2024 0926 Last data filed at 01/14/2024 9687 Gross per 24 hour  Intake 217.63 ml  Output 700 ml  Net -482.37 ml      01/13/2024    1:38 AM 11/20/2023    5:51 PM 10/22/2023    5:08 AM  Last 3 Weights  Weight (lbs) 240 lb 240 lb 241 lb 6.5 oz  Weight (kg) 108.863 kg 108.863 kg 109.5 kg      Telemetry/ECG  Aflutter rates 105-120 - Personally Reviewed  Physical Exam  GEN: No acute distress.   Neck: No JVD Cardiac: iRRR, no murmurs, rubs, or gallops.  Respiratory: Clear to auscultation bilaterally. GI: Soft, nontender, non-distended  MS: No edema  Assessment & Plan  1. Right knee pain, leukocytosis in generally debilitated patient per chart review, superimposed on frequent falls, possible hx of NPH - further eval per primary team   2. Persistent atrial flutter with RVR, wth reported hx of PAF/?PSVT, not on anticoagulation prior to admission due to frequent falls -  diltiazem  drip, continue.  - challenging situation. Appears asymptomatic from fast rates, likely long standing due to similar EKG 1 mo ago. Need to consider anticoagulation for further rhythm control options but patient defers. He would like for us  to call his primary cardiologist to inquire if short term anticoagulation to facilitate tee/dccv would be recommended by them as well. We can attempt this 7/28 when [facility] offices reopen. If patient is unstable, can  use IV amiodarone , however if remains clinically stable, IV amiodarone  should be deferred given subclinical hypothyroidism and lack of anticoagulation, as amio could precipitate cardioversion off of anticoagulation.  - on multaq  at home but remains in atrial flutter likely >1 mo. For now will hold and discuss other options, seems this is not providing rhythm control though it was intended for use for PVCs. - will hold toprol  and use lopressor  50 mg TID, rates improved on 25 mg TID so I will uptitrate dose today to lopressor  50 mg TID.   3. H/o subclinical hypothyroidism with normal TSH in 10/2023 - grossly normal tsh ft4 today.    4. Mild nonobstructive CAD 2019, aortic atherosclerosis - negative troponins x2   5. Hyperlipidemia - on atorvastatin    For questions or updates, please contact [facility] Please consult [email or web address] for contact info under       Signed, Aram Domzalski A Khadeja Abt, MD

## 2024-01-14 NOTE — TOC Initial Note (Signed)
 Transition of Care [facility]) - Initial/Assessment Note    Patient Details  Name: Alan Lin MRN: [id number] Date of Birth: [birth year]  [facility] CM/SW Contact:    Sonda Manuella Quill, RN Phone Number: 01/14/2024, 10:39 AM  Clinical Narrative:                 Spoke w/ pt in room; pt says he lives at [facility] [place]; he plans to return at d/c; pt identified POC brother Declin Rajan [phone]); pt unsure if his brother will provide transport or if he will need PTAR; he verified insurance/PCP; pt denied SDOH risks; he has walker, wheelchair, and shower chair; pt says he receives HHPT at facility; pt says he does not have home oxygen ; TOC is following.  Expected Discharge Plan: Home/Self Care Barriers to Discharge: Continued Medical Work up   Patient Goals and CMS Choice Patient states their goals for this hospitalization and ongoing recovery are:: home CMS Medicare.gov Compare Post Acute Care list provided to:: Patient   [place] ownership interest in [facility].provided to:: Patient    Expected Discharge Plan and Services   Discharge Planning Services: CM Consult   Living arrangements for the past 2 months: Independent Living Facility                                      Prior Living Arrangements/Services Living arrangements for the past 2 months: Independent Living Facility Lives with:: Self Patient language and need for interpreter reviewed:: Yes Do you feel safe going back to the place where you live?: Yes      Need for Family Participation in Patient Care: Yes (Comment) Care giver support system in place?: Yes (comment) Current home services: DME (walker, wheelchair, shower chair, HHPT) Criminal Activity/Legal Involvement Pertinent to Current Situation/Hospitalization: No - Comment as needed  Activities of Daily Living   ADL Screening (condition at time of admission) Independently performs ADLs?: Yes (appropriate for  developmental age) Is the patient deaf or have difficulty hearing?: No Does the patient have difficulty seeing, even when wearing glasses/contacts?: No Does the patient have difficulty concentrating, remembering, or making decisions?: No  Permission Sought/Granted Permission sought to share information with : Case Manager Permission granted to share information with : Yes, Verbal Permission Granted  Share Information with NAME: Case Manager     Permission granted to share info w Relationship: Sigfredo Schreier (brother) [phone]     Emotional Assessment Appearance:: Appears stated age Attitude/Demeanor/Rapport: Gracious Affect (typically observed): Accepting Orientation: : Oriented to Self, Oriented to Place, Oriented to  Time, Oriented to Situation Alcohol / Substance Use: Not Applicable Psych Involvement: No (comment)  Admission diagnosis:  Atrial fibrillation with rapid ventricular response (HCC) [I48.91] Atrial fibrillation with RVR (HCC) [I48.91] Right knee pain, unspecified chronicity [M25.561] Patient Active Problem List   Diagnosis Date Noted   Atrial fibrillation with RVR (HCC) 01/13/2024   Type 2 diabetes mellitus with hyperglycemia (HCC) 01/13/2024   Diabetic neuropathy (HCC) 11/30/2023   Generalized weakness 10/20/2023   Falls, initial encounter 10/20/2023   Acute cystitis 10/20/2023   Sepsis (HCC) 10/20/2023   Dehydration 10/20/2023   Paroxysmal atrial fibrillation (HCC) 10/20/2023   BPH (benign prostatic hyperplasia) 10/20/2023   Acute esophagitis 07/24/2022   Syncope 07/21/2022   Hypomagnesemia 07/21/2022   Hypokalemia 07/21/2022   Hyperlipidemia 07/21/2022   Essential hypertension 07/21/2022   Hypophosphatemia 07/21/2022   OSA (  obstructive sleep apnea) 07/21/2022   Falls 07/21/2022   Rib fracture 07/21/2022   Degeneration of lumbar intervertebral disc 11/01/2019   DDD (degenerative disc disease), cervical 10/28/2019   Cervical radiculitis 09/20/2019    Displacement 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 09/20/2019   Anxiety 07/16/2019   Right hip pain 07/30/2018   Primary osteoarthritis of both knees 07/30/2018   Pain in buttock 07/30/2018   Abdominal weakness 07/24/2017   Pain in joint, shoulder region 11/29/2016   DM2 (diabetes mellitus, type 2) (HCC) 03/15/2016   Depressive disorder 07/03/2012   PCP:  Agustina Greig NOVAK, MD Pharmacy:   [facility] [phone] - [place], [place] - [address] AT [facility] OF [address] [place] [address] Phone: [phone] Fax: [phone]     Social Drivers of Health (SDOH) Social History: SDOH Screenings   Food Insecurity: No Food Insecurity (01/14/2024)  Housing: Low Risk  (01/14/2024)  Transportation Needs: No Transportation Needs (01/14/2024)  Utilities: Not At Risk (01/14/2024)  Financial Resource Strain: Low Risk  (08/29/2023)   Received from [facility]  Physical Activity: Insufficiently Active (08/29/2023)   [facility]  Social Connections: Unknown (01/13/2024)  Tobacco Use: Low Risk  (01/13/2024)  Health Literacy: Low Risk  (08/29/2023)   Received from [facility]   SDOH Interventions: Food Insecurity Interventions: Intervention Not Indicated, Inpatient TOC Housing Interventions: Intervention Not Indicated, Inpatient TOC Transportation Interventions: Intervention Not Indicated, Inpatient TOC Utilities Interventions: Intervention Not Indicated, Inpatient TOC   Readmission Risk Interventions     No data to display

## 2024-01-14 NOTE — Progress Notes (Signed)
 Assumed care of patient from Jon Ada, RN at 1500. Agree with previously documented assessment. Will continue plan of care.

## 2024-01-14 NOTE — Progress Notes (Signed)
 PROGRESS NOTE  Alan Lin FMW:[id number] DOB: 1955/07/15 DOA: 01/13/2024 PCP: Alan Greig NOVAK, MD   LOS: 0 days   Brief narrative:  Alan Lin is a 68 y.o. male with past medical history of paroxysmal atrial fibrillation significant of osteoarthritis, type 2 diabetes, hyperlipidemia,  hypertension, atrial fibrillation, BPH, degeneration of lumbar intervertebral disc, cervical DDD, cervical radiculitis, history of falls, obstructive sleep apnea was brought from his facility to the emergency department due to knee pain and right lower extremity pain..  In the ED initial vitals were notable for tachycardia.  Labs showed CBC with WBC of 14.3. Troponin level was normal.  BNP 72.7 pg/mL.  BMP was unremarkable with the exception of glucose of 181 mg/dL. Phosphorus 3.2 and magnesium  1.9 mg/dL. Right knee x-ray showed no acute fracture or dislocation.  Chest x-ray without any infiltrate.  Patient was then admitted to the hospital for further evaluation and treatment.     Assessment/Plan:  Principal Problem:   Atrial fibrillation with RVR (HCC) Active Problems:   Anxiety   DDD (degenerative disc disease), cervical   Primary osteoarthritis of both knees   Degeneration of lumbar intervertebral disc   Hyperlipidemia   Essential hypertension   OSA (obstructive sleep apnea)   BPH (benign prostatic hyperplasia)   Depressive disorder   Diabetic neuropathy (HCC)   Type 2 diabetes mellitus with hyperglycemia (HCC)  Persistent atrial flutter with RVR CHA?DS?-VASc Score of at least 4.  Currently on Cardizem  drip.  Not a candidate for anticoagulation.  On metoprolol  p.o. 3 times daily as well.  2D echocardiogram from 01/13/2024 with LV ejection fraction of 70 to 75% with no regional wall motion abnormalities. Cardiology on board, will follow recommendations. Unable to use amiodarone  due to subclinical hypothyroidism.  Right knee pain.   History of falls. Continue supportive care.  PT  evaluation.  Subclinical hypothyroidism.  Normal TSH 10/2023.  Check free T4.  TSH of 2.0.  Mild hypokalemia.   Potassium 3.4 today.  Will replenish orally.  Check levels in AM.   Anxiety/  Depressive disorder Continue Cymbalta  and trazodone      DDD (degenerative disc disease), cervical and lumbar On gabapentin  and duloxetine .  Will continue     Primary osteoarthritis of both knees Tylenol  and Toradol .  PT when he feels better.  Declined narcotic due to constipation.    Hyperlipidemia Continue Lipitor     Essential hypertension Continue metoprolol .  Has been started on Cardizem  so we will hold off with amlodipine . Will likely go home with long-acting Cardizem .  Blood pressure seems to be stable at this time.    OSA (obstructive sleep apnea) CPAP at bedtime.     BPH (benign prostatic hyperplasia) Continue Flomax      Diabetic neuropathy  Continue gabapentin  and duloxetine      Type 2 diabetes mellitus with hyperglycemia  Continue sliding scale insulin  Jardiance .  Latest POC glucose of 201  Grade I obesity. Body mass index is 34.44 kg/m.  Will benefit from lifestyle modification.   DVT prophylaxis: enoxaparin  (LOVENOX ) injection 40 mg Start: 01/13/24 1000   Disposition:  Home with HH  Status is: Observation  The patient will require care spanning > 2 midnights and should be moved to inpatient because: pending clinical improvement.     Code Status:     Code Status: Full Code  Family Communication: none   Consultants: Cardiology  Procedures: None  Anti-infectives:  none  Anti-infectives (From admission, onward)    None  Subjective: Today, patient was seen and examined at bedside.  Patient lying flat in bed.  Denies any nausea, vomiting, fever chills or rigor.  Denies any dyspnea or shortness of breath or PND today.  Was able to sleep some in the nighttime.  Denies any chest pain or palpitation.  Objective: Vitals:   01/14/24 0455 01/14/24 0939   BP: 119/73 (!) 143/74  Pulse: 91 99  Resp: 19   Temp: 98.3 F (36.8 C)   SpO2: 98%     Intake/Output Summary (Last 24 hours) at 01/14/2024 1006 Last data filed at 01/14/2024 9687 Gross per 24 hour  Intake 217.63 ml  Output 700 ml  Net -482.37 ml   Filed Weights   01/13/24 0138  Weight: 108.9 kg   Body mass index is 34.44 kg/m.   Physical Exam:  GENERAL: Patient is alert awake and oriented. Not in obvious distress.  Obese build HENT: No scleral pallor or icterus. Pupils equally reactive to light. Oral mucosa is moist NECK: is supple, no gross swelling noted. CHEST: Clear to auscultation, Diminished breath sounds bilaterally. CVS: S1 and S2 heard, no murmur.  Irregularly irregular rhythm with tachycardia. ABDOMEN: Soft, non-tender, bowel sounds are present. EXTREMITIES: No edema. CNS: Cranial nerves are intact. No focal motor deficits. SKIN: warm and dry without rashes.  Data Review: I have personally reviewed the following laboratory data and studies,  CBC: Recent Labs  Lab 01/13/24 0305 01/14/24 0505  WBC 14.3* 12.8*  NEUTROABS 11.4*  --   HGB 14.3 14.5  HCT 45.9 46.4  MCV 83.5 85.0  PLT 325 332   Basic Metabolic Panel: Recent Labs  Lab 01/13/24 0305 01/14/24 0505  NA 139 137  K 4.0 3.4*  CL 99 100  CO2 28 26  GLUCOSE 181* 173*  BUN 16 24*  CREATININE 0.72 0.90  CALCIUM  10.1 9.4  MG 1.9  --   PHOS 3.2  --    Liver Function Tests: Recent Labs  Lab 01/13/24 1231  AST 13*  ALT 12  ALKPHOS 55  BILITOT 1.1  PROT 6.2*  ALBUMIN 2.9*   No results for input(s): LIPASE, AMYLASE in the last 168 hours. No results for input(s): AMMONIA in the last 168 hours. Cardiac Enzymes: No results for input(s): CKTOTAL, CKMB, CKMBINDEX, TROPONINI in the last 168 hours. BNP (last 3 results) Recent Labs    01/13/24 0305  BNP 72.7    ProBNP (last 3 results) No results for input(s): PROBNP in the last 8760 hours.  CBG: Recent Labs  Lab  01/13/24 0838 01/13/24 1219 01/13/24 1654 01/13/24 2027 01/14/24 0732  GLUCAP 191* 121* 164* 201* 177*   No results found for this or any previous visit (from the past 240 hours).   Studies: ECHOCARDIOGRAM COMPLETE Result Date: 01/13/2024    ECHOCARDIOGRAM REPORT   Patient Name:   Alan Lin Alan Lin Date of Exam: 01/13/2024 Medical Rec #:  [id number]        Height:       70.0 in Accession #:    [id number]       Weight:       240.0 lb Date of Birth:  [birth year]         BSA:          2.255 m Patient Age:    68 years         BP:           109/58 mmHg Patient Gender: M  HR:           142 bpm. Exam Location:  Inpatient Procedure: 2D Echo, Cardiac Doppler, Color Doppler and Intracardiac            Opacification Agent (Both Spectral and Color Flow Doppler were            utilized during procedure). Indications:    I48.91* Unspeicified atrial fibrillation  History:        Patient has prior history of Echocardiogram examinations, most                 recent 07/22/2022. Abnormal ECG, Arrythmias:Atrial Fibrillation,                 Signs/Symptoms:Syncope; Risk Factors:Diabetes, Dyslipidemia and                 Sleep Apnea.  Sonographer:    Ellouise Mose RDCS Referring Phys: [id number] Opal MANUEL ORTIZ  Sonographer Comments: Technically difficult study due to poor echo windows. Image acquisition challenging due to patient body habitus and Image acquisition challenging due to respiratory motion. Extremely difficult study. Off axis apicals IMPRESSIONS  1. Left ventricular ejection fraction, by estimation, is 70 to 75%. Left ventricular ejection fraction by 2D MOD biplane is 72.6 %. The left ventricle has hyperdynamic function. The left ventricle has no regional wall motion abnormalities. There is mild  left ventricular hypertrophy. Left ventricular diastolic parameters are indeterminate.  2. Right ventricular systolic function is normal. The right ventricular size is mildly enlarged. Tricuspid regurgitation signal  is inadequate for assessing PA pressure.  3. Moderate pericardial effusion, more adjacent to the RV - the IVC does not collapse, but there is no RA or RV collapse or exaggerated inflow variation to suggest tamponade - clinical correlation is advised, especially given the elevated HR. Moderate pericardial effusion. The pericardial effusion is circumferential. There is no evidence of cardiac tamponade.  4. The mitral valve is grossly normal. Trivial mitral valve regurgitation. No evidence of mitral stenosis. Moderate mitral annular calcification.  5. The aortic valve is tricuspid. Aortic valve regurgitation is not visualized. Aortic valve sclerosis is present, with no evidence of aortic valve stenosis.  6. The inferior vena cava is dilated in size with <50% respiratory variability, suggesting right atrial pressure of 15 mmHg. Comparison(s): Changes from prior study are noted. 07/22/2022: LVEF 60-65% (technically difficult study, no pericardial effusion noted). A new pericardial effuison is noted. Conclusion(s)/Recommendation(s): Consider limited echo to re-assess effusion in 2-3 days. FINDINGS  Left Ventricle: Left ventricular ejection fraction, by estimation, is 70 to 75%. Left ventricular ejection fraction by 2D MOD biplane is 72.6 %. The left ventricle has hyperdynamic function. The left ventricle has no regional wall motion abnormalities. Definity  contrast agent was given IV to delineate the left ventricular endocardial borders. The left ventricular internal cavity size was normal in size. There is mild left ventricular hypertrophy. Left ventricular diastolic parameters are indeterminate. Right Ventricle: The right ventricular size is mildly enlarged. No increase in right ventricular wall thickness. Right ventricular systolic function is normal. Tricuspid regurgitation signal is inadequate for assessing PA pressure. Left Atrium: Left atrial size was normal in size. Right Atrium: Right atrial size was normal in size.  Pericardium: Moderate pericardial effusion, more adjacent to the RV - the IVC does not collapse, but there is no RA or RV collapse or exaggerated inflow variation to suggest tamponade - clinical correlation is advised, especially given the elevated HR. A  moderately sized pericardial effusion is present. The  pericardial effusion is circumferential. There is no evidence of cardiac tamponade. Mitral Valve: The mitral valve is grossly normal. Moderate mitral annular calcification. Trivial mitral valve regurgitation. No evidence of mitral valve stenosis. Tricuspid Valve: The tricuspid valve is normal in structure. Tricuspid valve regurgitation is trivial. No evidence of tricuspid stenosis. Aortic Valve: The aortic valve is tricuspid. Aortic valve regurgitation is not visualized. Aortic valve sclerosis is present, with no evidence of aortic valve stenosis. Pulmonic Valve: The pulmonic valve was normal in structure. Pulmonic valve regurgitation is not visualized. No evidence of pulmonic stenosis. Aorta: The aortic root and ascending aorta are structurally normal, with no evidence of dilitation. Venous: The inferior vena cava is dilated in size with less than 50% respiratory variability, suggesting right atrial pressure of 15 mmHg. IAS/Shunts: No atrial level shunt detected by color flow Doppler.  LEFT VENTRICLE PLAX 2D                        Biplane EF (MOD) LVIDd:         4.70 cm         LV Biplane EF:   Left LVIDs:         3.30 cm                          ventricular LV PW:         1.10 cm                          ejection LV IVS:        0.90 cm                          fraction by LVOT diam:     2.40 cm                          2D MOD LV SV:         36                               biplane is LV SV Index:   16                               72.6 %. LVOT Area:     4.52 cm  LV Volumes (MOD) LV vol d, MOD    85.8 ml A2C: LV vol d, MOD    79.9 ml A4C: LV vol s, MOD    21.8 ml A2C: LV vol s, MOD    20.3 ml A4C: LV SV MOD  A2C:   64.0 ml LV SV MOD A4C:   79.9 ml LV SV MOD BP:    62.1 ml RIGHT VENTRICLE             IVC RV S prime:     12.20 cm/s  IVC diam: 2.50 cm TAPSE (M-mode): 1.3 cm LEFT ATRIUM           Index        RIGHT ATRIUM           Index LA diam:      4.40 cm 1.95 cm/m   RA Area:     10.20 cm LA Vol (A2C): 40.8 ml 18.09  ml/m  RA Volume:   18.00 ml  7.98 ml/m LA Vol (A4C): 24.8 ml 11.00 ml/m  AORTIC VALVE LVOT Vmax:   70.60 cm/s LVOT Vmean:  43.800 cm/s LVOT VTI:    0.080 m  AORTA Ao Root diam: 3.40 cm Ao Asc diam:  3.40 cm MITRAL VALVE MV Area (PHT): 6.17 cm    SHUNTS MV Decel Time: 123 msec    Systemic VTI:  0.08 m MV E velocity: 69.50 cm/s  Systemic Diam: 2.40 cm MV A velocity: 48.10 cm/s MV E/A ratio:  1.44 Vinie Maxcy MD Electronically signed by Vinie Maxcy MD Signature Date/Time: 01/13/2024/1:56:46 PM    Final    DG Chest Port 1 View Result Date: 01/13/2024 CLINICAL DATA:  Atrial fibrillation with rapid ventricular response EXAM: PORTABLE CHEST 1 VIEW COMPARISON:  None Available. FINDINGS: Low lung volumes. No pulmonary edema, pleural effusion or pneumothorax. No focal airspace infiltrate. Calcifications present in the transverse aorta consistent with aortic atherosclerotic vascular calcifications. IMPRESSION: Low lung volumes without evidence of acute cardiopulmonary process. Aortic Atherosclerosis (ICD10-I70.0). Electronically Signed   By: Wilkie Lent M.D.   On: 01/13/2024 08:26   DG Knee 2 Views Right Result Date: 01/13/2024 CLINICAL DATA:  Right knee pain EXAM: RIGHT KNEE - 1-2 VIEW COMPARISON:  None Available. FINDINGS: Frontal lateral radiographs are slightly limited by obliquity. Normal alignment. No acute fracture or dislocation. Joint spaces appear preserved on this limited examination. No effusion. Advanced vascular calcification. IMPRESSION: 1. No acute fracture or dislocation. 2. Advanced vascular calcification. Electronically Signed   By: Dorethia Molt M.D.   On: 01/13/2024 02:16       Vernal Alstrom, MD  [facility] 01/14/2024  If 7PM-7AM, please contact night-coverage

## 2024-01-14 NOTE — Plan of Care (Signed)
 Continue with plan of care.

## 2024-01-14 NOTE — Care Management Obs Status (Signed)
 MEDICARE OBSERVATION STATUS NOTIFICATION   Patient Details  Name: Alan Lin MRN: [id number] Date of Birth: May 02, 1956   Medicare Observation Status Notification Given:  Yes    Sonda Manuella Quill, RN 01/14/2024, 10:05 AM

## 2024-01-15 DIAGNOSIS — E1140 Type 2 diabetes mellitus with diabetic neuropathy, unspecified: Secondary | ICD-10-CM | POA: Diagnosis present

## 2024-01-15 DIAGNOSIS — F419 Anxiety disorder, unspecified: Secondary | ICD-10-CM | POA: Diagnosis present

## 2024-01-15 DIAGNOSIS — I4891 Unspecified atrial fibrillation: Secondary | ICD-10-CM | POA: Diagnosis present

## 2024-01-15 DIAGNOSIS — I4892 Unspecified atrial flutter: Secondary | ICD-10-CM | POA: Diagnosis present

## 2024-01-15 DIAGNOSIS — I3139 Other pericardial effusion (noninflammatory): Secondary | ICD-10-CM | POA: Diagnosis not present

## 2024-01-15 DIAGNOSIS — Z7901 Long term (current) use of anticoagulants: Secondary | ICD-10-CM | POA: Diagnosis not present

## 2024-01-15 DIAGNOSIS — E66811 Obesity, class 1: Secondary | ICD-10-CM | POA: Diagnosis present

## 2024-01-15 DIAGNOSIS — Z7984 Long term (current) use of oral hypoglycemic drugs: Secondary | ICD-10-CM | POA: Diagnosis not present

## 2024-01-15 DIAGNOSIS — I480 Paroxysmal atrial fibrillation: Secondary | ICD-10-CM | POA: Diagnosis present

## 2024-01-15 DIAGNOSIS — E785 Hyperlipidemia, unspecified: Secondary | ICD-10-CM | POA: Diagnosis present

## 2024-01-15 DIAGNOSIS — E1165 Type 2 diabetes mellitus with hyperglycemia: Secondary | ICD-10-CM | POA: Diagnosis present

## 2024-01-15 DIAGNOSIS — I2510 Atherosclerotic heart disease of native coronary artery without angina pectoris: Secondary | ICD-10-CM | POA: Diagnosis present

## 2024-01-15 DIAGNOSIS — F32A Depression, unspecified: Secondary | ICD-10-CM | POA: Diagnosis present

## 2024-01-15 DIAGNOSIS — Z6834 Body mass index (BMI) 34.0-34.9, adult: Secondary | ICD-10-CM | POA: Diagnosis not present

## 2024-01-15 DIAGNOSIS — E876 Hypokalemia: Secondary | ICD-10-CM | POA: Diagnosis present

## 2024-01-15 DIAGNOSIS — E038 Other specified hypothyroidism: Secondary | ICD-10-CM | POA: Diagnosis present

## 2024-01-15 DIAGNOSIS — M5030 Other cervical disc degeneration, unspecified cervical region: Secondary | ICD-10-CM | POA: Diagnosis present

## 2024-01-15 DIAGNOSIS — M170 Bilateral primary osteoarthritis of knee: Secondary | ICD-10-CM | POA: Diagnosis present

## 2024-01-15 DIAGNOSIS — I700 Atherosclerosis of aorta: Secondary | ICD-10-CM | POA: Diagnosis present

## 2024-01-15 DIAGNOSIS — N400 Enlarged prostate without lower urinary tract symptoms: Secondary | ICD-10-CM | POA: Diagnosis present

## 2024-01-15 DIAGNOSIS — G4733 Obstructive sleep apnea (adult) (pediatric): Secondary | ICD-10-CM | POA: Diagnosis present

## 2024-01-15 DIAGNOSIS — I10 Essential (primary) hypertension: Secondary | ICD-10-CM | POA: Diagnosis present

## 2024-01-15 DIAGNOSIS — Z79899 Other long term (current) drug therapy: Secondary | ICD-10-CM | POA: Diagnosis not present

## 2024-01-15 DIAGNOSIS — R296 Repeated falls: Secondary | ICD-10-CM | POA: Diagnosis present

## 2024-01-15 DIAGNOSIS — M51369 Other intervertebral disc degeneration, lumbar region without mention of lumbar back pain or lower extremity pain: Secondary | ICD-10-CM | POA: Diagnosis present

## 2024-01-15 LAB — URINALYSIS, ROUTINE W REFLEX MICROSCOPIC
Bilirubin Urine: NEGATIVE
Glucose, UA: >=500 mg/dL — AB
Hgb urine dipstick: NEGATIVE
Ketones, ur: NEGATIVE mg/dL
Leukocytes,Ua: NEGATIVE
Nitrite: NEGATIVE
Protein, ur: NEGATIVE mg/dL
Specific Gravity, Urine: 1.029 (ref 1.005–1.030)
pH: 5 (ref 5.0–8.0)

## 2024-01-15 LAB — BASIC METABOLIC PANEL WITH GFR
Anion gap: 11 (ref 5–15)
BUN: 23 mg/dL (ref 8–23)
CO2: 23 mmol/L (ref 22–32)
Calcium: 9 mg/dL (ref 8.9–10.3)
Chloride: 101 mmol/L (ref 98–111)
Creatinine, Ser: 0.86 mg/dL (ref 0.61–1.24)
GFR, Estimated: >60 mL/min (ref >60)
Glucose, Bld: 181 mg/dL — ABNORMAL HIGH (ref 70–99)
Potassium: 3.8 mmol/L (ref 3.5–5.1)
Sodium: 135 mmol/L (ref 135–145)

## 2024-01-15 LAB — GLUCOSE, CAPILLARY
Glucose-Capillary: 170 mg/dL — ABNORMAL HIGH (ref 70–99)
Glucose-Capillary: 148 mg/dL — ABNORMAL HIGH (ref 70–99)
Glucose-Capillary: 146 mg/dL — ABNORMAL HIGH (ref 70–99)
Glucose-Capillary: 176 mg/dL — ABNORMAL HIGH (ref 70–99)

## 2024-01-15 LAB — CBC
HCT: 41.5 % (ref 39.0–52.0)
Hemoglobin: 12.5 g/dL — ABNORMAL LOW (ref 13.0–17.0)
MCH: 25.6 pg — ABNORMAL LOW (ref 26.0–34.0)
MCHC: 30.1 g/dL (ref 30.0–36.0)
MCV: 84.9 fL (ref 80.0–100.0)
Platelets: 346 K/uL (ref 150–400)
RBC: 4.89 MIL/uL (ref 4.22–5.81)
RDW: 14.3 % (ref 11.5–15.5)
WBC: 12.9 K/uL — ABNORMAL HIGH (ref 4.0–10.5)
nRBC: 0 % (ref 0.0–0.2)

## 2024-01-15 LAB — MAGNESIUM: Magnesium: 2.1 mg/dL (ref 1.7–2.4)

## 2024-01-15 MED ORDER — DILTIAZEM HCL 60 MG PO TABS
90.0000 mg | ORAL_TABLET | Freq: Four times a day (QID) | ORAL | Status: AC
  Administered 2024-01-15 – 2024-01-18 (×14): 90 mg via ORAL
  Filled 2024-01-15 (×14): qty 1

## 2024-01-15 MED ORDER — DILTIAZEM HCL 60 MG PO TABS: 90.0000 mg | ORAL_TABLET | Freq: Four times a day (QID) | ORAL | Status: DC

## 2024-01-15 NOTE — Progress Notes (Signed)
   01/15/24 2210  BiPAP/CPAP/SIPAP  BiPAP/CPAP/SIPAP Pt Type Adult  BiPAP/CPAP/SIPAP DREAMSTATIOND  Mask Type Full face mask  Dentures removed? Not applicable  Mask Size Medium  EPAP 14 cmH2O  FiO2 (%) 21 %  Patient Home Machine No  Patient Home Mask No  Patient Home Tubing No  Auto Titrate No  Device Plugged into [employer] Power Outlet Yes

## 2024-01-15 NOTE — Progress Notes (Signed)
 PROGRESS NOTE  Alan Lin FMW:[id number] DOB: 1955/07/19 DOA: 01/13/2024 PCP: Agustina Greig NOVAK, MD   LOS: 0 days   Brief narrative:  Alan Lin is a 68 y.o. male with past medical history of paroxysmal atrial fibrillation significant of osteoarthritis, type 2 diabetes, hyperlipidemia,  hypertension, atrial fibrillation, BPH, degeneration of lumbar intervertebral disc, cervical DDD, cervical radiculitis, history of falls, obstructive sleep apnea was brought from his home to the emergency department due to knee pain and right lower extremity pain..  In the ED initial vitals were notable for tachycardia.  Labs showed CBC with WBC of 14.3. Troponin level was normal.  BNP 72.7 pg/mL.  BMP was unremarkable with the exception of glucose of 181 mg/dL. Phosphorus 3.2 and magnesium  1.9 mg/dL. Right knee x-ray showed no acute fracture or dislocation.  Chest x-ray without any infiltrate.  Patient was then admitted to the hospital for further evaluation and treatment.     Assessment/Plan:  Principal Problem:   Atrial fibrillation with RVR (HCC) Active Problems:   Anxiety   DDD (degenerative disc disease), cervical   Primary osteoarthritis of both knees   Degeneration of lumbar intervertebral disc   Hyperlipidemia   Essential hypertension   OSA (obstructive sleep apnea)   BPH (benign prostatic hyperplasia)   Depressive disorder   Diabetic neuropathy (HCC)   Type 2 diabetes mellitus with hyperglycemia (HCC)   Atrial flutter with rapid ventricular response (HCC)  Persistent atrial flutter with RVR CHA?DS?-VASc Score of at least 4.  Was initially on Cardizem  drip.  Has been changed to Cardizem  90 every 6 hourly from today.  Has been started on metoprolol  50 3 times daily.  Not a candidate for anticoagulation.   2D echocardiogram from 01/13/2024 with LV ejection fraction of 70 to 75% with no regional wall motion abnormalities. Cardiology on board, will follow recommendations. Unable to use amiodarone   due to subclinical hypothyroidism.  Right knee pain.   History of falls. Continue supportive care.  Pending PT evaluation.  Subclinical hypothyroidism.  Normal TSH 10/2023.  Check free T4.  TSH of 2.0.  Mild hypokalemia.   Improved after replacement.  Latest potassium of 3.8   Anxiety/  Depressive disorder Continue Cymbalta  and trazodone      DDD (degenerative disc disease), cervical and lumbar On gabapentin  and duloxetine .  Will continue     Primary osteoarthritis of both knees Symptomatic care.  PT evaluation    Hyperlipidemia Continue Lipitor     Essential hypertension Metoprolol  and short acting Cardizem  at this time.  Cardiology adjusting medication.    OSA (obstructive sleep apnea) CPAP at bedtime.     BPH (benign prostatic hyperplasia) Continue Flomax      Diabetic neuropathy  Continue gabapentin  and duloxetine      Type 2 diabetes mellitus with hyperglycemia  Continue sliding scale insulin  Jardiance .  Latest POC glucose  of 170  Grade I obesity. Body mass index is 34.44 kg/m.  Will benefit from lifestyle modification.   DVT prophylaxis: enoxaparin  (LOVENOX ) injection 40 mg Start: 01/13/24 1000   Disposition:  Home with HH likely in 1 to 2 days when okay with cardiology, PT evaluation pending  Status is: Inpatient   Code Status:     Code Status: Full Code  Family Communication: none   Consultants: Cardiology  Procedures: None  Anti-infectives:  none  Anti-infectives (From admission, onward)    None       Subjective: Today, patient was seen and examined at bedside.  Patient stated that she slept  well.  Denies any pain, nausea, vomiting, fever, chills or rigor.  Patient lying flat in bed.  Denies any palpitations or chest pain  Objective: Vitals:   01/15/24 0800 01/15/24 0839  BP: 94/76 (!) 105/57  Pulse: 100 95  Resp: 18 20  Temp:  98.1 F (36.7 C)  SpO2: 92% 93%    Intake/Output Summary (Last 24 hours) at 01/15/2024 1123 Last  data filed at 01/15/2024 0800 Gross per 24 hour  Intake 1407.05 ml  Output 950 ml  Net 457.05 ml   Filed Weights   01/13/24 0138  Weight: 108.9 kg   Body mass index is 34.44 kg/m.   Physical Exam:  GENERAL: Patient is alert awake and oriented. Not in obvious distress.  Obese build HENT: No scleral pallor or icterus. Pupils equally reactive to light. Oral mucosa is moist NECK: is supple, no gross swelling noted. CHEST: Clear to auscultation, Diminished breath sounds bilaterally. CVS: S1 and S2 heard, no murmur.  Intermittent tachycardia ABDOMEN: Soft, non-tender, bowel sounds are present. EXTREMITIES: No edema. CNS: Cranial nerves are intact. No focal motor deficits. SKIN: warm and dry without rashes.  Data Review: I have personally reviewed the following laboratory data and studies,  CBC: Recent Labs  Lab 01/13/24 0305 01/14/24 0505 01/15/24 0444  WBC 14.3* 12.8* 12.9*  NEUTROABS 11.4*  --   --   HGB 14.3 14.5 12.5*  HCT 45.9 46.4 41.5  MCV 83.5 85.0 84.9  PLT 325 332 346   Basic Metabolic Panel: Recent Labs  Lab 01/13/24 0305 01/14/24 0505 01/15/24 0444  NA 139 137 135  K 4.0 3.4* 3.8  CL 99 100 101  CO2 28 26 23   GLUCOSE 181* 173* 181*  BUN 16 24* 23  CREATININE 0.72 0.90 0.86  CALCIUM  10.1 9.4 9.0  MG 1.9  --  2.1  PHOS 3.2  --   --    Liver Function Tests: Recent Labs  Lab 01/13/24 1231  AST 13*  ALT 12  ALKPHOS 55  BILITOT 1.1  PROT 6.2*  ALBUMIN 2.9*   No results for input(s): LIPASE, AMYLASE in the last 168 hours. No results for input(s): AMMONIA in the last 168 hours. Cardiac Enzymes: No results for input(s): CKTOTAL, CKMB, CKMBINDEX, TROPONINI in the last 168 hours. BNP (last 3 results) Recent Labs    01/13/24 0305  BNP 72.7    ProBNP (last 3 results) No results for input(s): PROBNP in the last 8760 hours.  CBG: Recent Labs  Lab 01/14/24 0732 01/14/24 1124 01/14/24 1643 01/14/24 2039 01/15/24 0733   GLUCAP 177* 178* 192* 176* 170*   No results found for this or any previous visit (from the past 240 hours).   Studies: ECHOCARDIOGRAM COMPLETE Result Date: 01/13/2024    ECHOCARDIOGRAM REPORT   Patient Name:   Alan Lin Date of Exam: 01/13/2024 Medical Rec #:  [id number]        Height:       70.0 in Accession #:    [id number]       Weight:       240.0 lb Date of Birth:  [birth year]         BSA:          2.255 m Patient Age:    68 years         BP:           109/58 mmHg Patient Gender: M  HR:           142 bpm. Exam Location:  Inpatient Procedure: 2D Echo, Cardiac Doppler, Color Doppler and Intracardiac            Opacification Agent (Both Spectral and Color Flow Doppler were            utilized during procedure). Indications:    I48.91* Unspeicified atrial fibrillation  History:        Patient has prior history of Echocardiogram examinations, most                 recent 07/22/2022. Abnormal ECG, Arrythmias:Atrial Fibrillation,                 Signs/Symptoms:Syncope; Risk Factors:Diabetes, Dyslipidemia and                 Sleep Apnea.  Sonographer:    Ellouise Mose RDCS Referring Phys: [id number] Olanrewaju MANUEL ORTIZ  Sonographer Comments: Technically difficult study due to poor echo windows. Image acquisition challenging due to patient body habitus and Image acquisition challenging due to respiratory motion. Extremely difficult study. Off axis apicals IMPRESSIONS  1. Left ventricular ejection fraction, by estimation, is 70 to 75%. Left ventricular ejection fraction by 2D MOD biplane is 72.6 %. The left ventricle has hyperdynamic function. The left ventricle has no regional wall motion abnormalities. There is mild  left ventricular hypertrophy. Left ventricular diastolic parameters are indeterminate.  2. Right ventricular systolic function is normal. The right ventricular size is mildly enlarged. Tricuspid regurgitation signal is inadequate for assessing PA pressure.  3. Moderate pericardial effusion,  more adjacent to the RV - the IVC does not collapse, but there is no RA or RV collapse or exaggerated inflow variation to suggest tamponade - clinical correlation is advised, especially given the elevated HR. Moderate pericardial effusion. The pericardial effusion is circumferential. There is no evidence of cardiac tamponade.  4. The mitral valve is grossly normal. Trivial mitral valve regurgitation. No evidence of mitral stenosis. Moderate mitral annular calcification.  5. The aortic valve is tricuspid. Aortic valve regurgitation is not visualized. Aortic valve sclerosis is present, with no evidence of aortic valve stenosis.  6. The inferior vena cava is dilated in size with <50% respiratory variability, suggesting right atrial pressure of 15 mmHg. Comparison(s): Changes from prior study are noted. 07/22/2022: LVEF 60-65% (technically difficult study, no pericardial effusion noted). A new pericardial effuison is noted. Conclusion(s)/Recommendation(s): Consider limited echo to re-assess effusion in 2-3 days. FINDINGS  Left Ventricle: Left ventricular ejection fraction, by estimation, is 70 to 75%. Left ventricular ejection fraction by 2D MOD biplane is 72.6 %. The left ventricle has hyperdynamic function. The left ventricle has no regional wall motion abnormalities. Definity  contrast agent was given IV to delineate the left ventricular endocardial borders. The left ventricular internal cavity size was normal in size. There is mild left ventricular hypertrophy. Left ventricular diastolic parameters are indeterminate. Right Ventricle: The right ventricular size is mildly enlarged. No increase in right ventricular wall thickness. Right ventricular systolic function is normal. Tricuspid regurgitation signal is inadequate for assessing PA pressure. Left Atrium: Left atrial size was normal in size. Right Atrium: Right atrial size was normal in size. Pericardium: Moderate pericardial effusion, more adjacent to the RV - the  IVC does not collapse, but there is no RA or RV collapse or exaggerated inflow variation to suggest tamponade - clinical correlation is advised, especially given the elevated HR. A  moderately sized pericardial effusion is present. The  pericardial effusion is circumferential. There is no evidence of cardiac tamponade. Mitral Valve: The mitral valve is grossly normal. Moderate mitral annular calcification. Trivial mitral valve regurgitation. No evidence of mitral valve stenosis. Tricuspid Valve: The tricuspid valve is normal in structure. Tricuspid valve regurgitation is trivial. No evidence of tricuspid stenosis. Aortic Valve: The aortic valve is tricuspid. Aortic valve regurgitation is not visualized. Aortic valve sclerosis is present, with no evidence of aortic valve stenosis. Pulmonic Valve: The pulmonic valve was normal in structure. Pulmonic valve regurgitation is not visualized. No evidence of pulmonic stenosis. Aorta: The aortic root and ascending aorta are structurally normal, with no evidence of dilitation. Venous: The inferior vena cava is dilated in size with less than 50% respiratory variability, suggesting right atrial pressure of 15 mmHg. IAS/Shunts: No atrial level shunt detected by color flow Doppler.  LEFT VENTRICLE PLAX 2D                        Biplane EF (MOD) LVIDd:         4.70 cm         LV Biplane EF:   Left LVIDs:         3.30 cm                          ventricular LV PW:         1.10 cm                          ejection LV IVS:        0.90 cm                          fraction by LVOT diam:     2.40 cm                          2D MOD LV SV:         36                               biplane is LV SV Index:   16                               72.6 %. LVOT Area:     4.52 cm  LV Volumes (MOD) LV vol d, MOD    85.8 ml A2C: LV vol d, MOD    79.9 ml A4C: LV vol s, MOD    21.8 ml A2C: LV vol s, MOD    20.3 ml A4C: LV SV MOD A2C:   64.0 ml LV SV MOD A4C:   79.9 ml LV SV MOD BP:    62.1 ml RIGHT  VENTRICLE             IVC RV S prime:     12.20 cm/s  IVC diam: 2.50 cm TAPSE (M-mode): 1.3 cm LEFT ATRIUM           Index        RIGHT ATRIUM           Index LA diam:      4.40 cm 1.95 cm/m   RA Area:     10.20 cm LA Vol (A2C): 40.8 ml 18.09  ml/m  RA Volume:   18.00 ml  7.98 ml/m LA Vol (A4C): 24.8 ml 11.00 ml/m  AORTIC VALVE LVOT Vmax:   70.60 cm/s LVOT Vmean:  43.800 cm/s LVOT VTI:    0.080 m  AORTA Ao Root diam: 3.40 cm Ao Asc diam:  3.40 cm MITRAL VALVE MV Area (PHT): 6.17 cm    SHUNTS MV Decel Time: 123 msec    Systemic VTI:  0.08 m MV E velocity: 69.50 cm/s  Systemic Diam: 2.40 cm MV A velocity: 48.10 cm/s MV E/A ratio:  1.44 Vinie Maxcy MD Electronically signed by Vinie Maxcy MD Signature Date/Time: 01/13/2024/1:56:46 PM    Final       Vernal Alstrom, MD  [facility] 01/15/2024  If 7PM-7AM, please contact night-coverage

## 2024-01-15 NOTE — Evaluation (Signed)
 Physical Therapy Evaluation Patient Details Name: Alan Lin MRN: [id number] DOB: Dec 25, 1955 Today's Date: 01/15/2024  History of Present Illness  Alan Lin is a 68 y.o. male admitted with afib with RVR; presents with complaints of R knee pain. PMH: osteoarthritis, type 2 diabetes, hyperlipidemia, hypomagnesemia, hypophosphatemia, hypertension, atrial fibrillation, BPH, degeneration of lumbar intervertebral disc, cervical DDD, cervical radiculitis, history of falls, obstructive sleep apnea, anxiety, history of sepsis, history of syncope, paroxysmal atrial fibrillation  Clinical Impression  Pt admitted with above diagnosis. Pt reports from [facility], ind with RW and w/c mobilizing around facility, reports ind with self care and partakes in facility meals. Pt later states a nurse tech helps him into/out of bed, but then states I lie all the time and laughs. Pt also denies falls in the last 6 months, then laughs and sates I lie all the time later reporting he fell in May 2025 and went to rehab. No family present to confirm PLOF. On eval, pt needing significant assistance to come to sitting EOB, max A to mobilize BLE and upright trunk, strong posterior lean that somewhat improves while seated EOB, min A to maintain supported sitting EOB. Pt reports dizziness, HR noted to  be 110 so assisted back to supine, and vitals recorded BP 113/88, HR 93 and SpO2 94% on RA. Pt needing max A to return to supine and reposition to comfort. RLE with knee pain highest with heel slide; pt able to perform SLR x5 reps and quad set with 5 sec isometric hold without pain complaints, but lacking ~30 deg from extension with LAQ when seated EOB. Pt reports sensation normal throughout RLE, tenderness to palpation noted over patella. RN notified of vitals and session. Patient will benefit from continued inpatient follow up therapy, <3 hours/day. Pt currently with functional limitations due to the deficits listed below  (see PT Problem List). Pt will benefit from acute skilled PT to increase their independence and safety with mobility to allow discharge.           If plan is discharge home, recommend the following: A lot of help with walking and/or transfers;A lot of help with bathing/dressing/bathroom;Assist for transportation   Can travel by private vehicle   No    Equipment Recommendations None recommended by PT  Recommendations for Other Services  OT consult    Functional Status Assessment Patient has had a recent decline in their functional status and demonstrates the ability to make significant improvements in function in a reasonable and predictable amount of time.     Precautions / Restrictions Precautions Precautions: Fall Restrictions Weight Bearing Restrictions Per Provider Order: No      Mobility  Bed Mobility Overal bed mobility: Needs Assistance Bed Mobility: Supine to Sit, Sit to Supine     Supine to sit: Max assist, HOB elevated, Used rails Sit to supine: Max assist, Used rails   General bed mobility comments: max A to come to sitting EOB, pt needing cues for sequencing and hand placement to power up to sitting; max A to lift BLE back into bed and scoot up in bed with bedpad and bed assist; min A with BUE support for static sitting EOB    Transfers                        Ambulation/Gait                  Stairs  Wheelchair Mobility     Tilt Bed    Modified Rankin (Stroke Patients Only)       Balance Overall balance assessment: Needs assistance Sitting-balance support: Feet supported, Bilateral upper extremity supported Sitting balance-Leahy Scale: Poor                                       Pertinent Vitals/Pain Pain Assessment Pain Assessment: 0-10 Pain Score:  (3/10 at rest, 5/10 with heel slide) Pain Location: R knee Pain Descriptors / Indicators: Throbbing Pain Intervention(s): Limited activity  within patient's tolerance, Monitored during session    Home Living Family/patient expects to be discharged to:: Assisted living                 Home Equipment: Rolling Walker (2 wheels);Wheelchair - manual;Cane - single point;Grab bars - toilet;Grab bars - tub/shower Additional Comments: [place]    Prior Function Prior Level of Function : Independent/Modified Independent;History of Falls (last six months)             Mobility Comments: pt reports no longer using 2 SPC since he fell beginning of May 2025, reports sold it in regards to rollator, ADLs Comments: pt reports ind with self care, facility provides meals     Extremity/Trunk Assessment   Upper Extremity Assessment Upper Extremity Assessment: Defer to OT evaluation    Lower Extremity Assessment Lower Extremity Assessment: RLE deficits/detail;LLE deficits/detail RLE Deficits / Details: tender to palpation over patella, ankle WFL, good quad set with SLR and no extensor lag noted also denies pain, heel slide ~45 deg with reports of pain, LAQ ~30 deg from extension RLE Sensation: WNL RLE Coordination: WNL LLE Deficits / Details: AROM WFL, strength grossly 3+/5, able to perform quad set, SLR, LAQ, seated hip flexion/march LLE Sensation: WNL LLE Coordination: WNL       Communication   Communication Communication: No apparent difficulties    Cognition Arousal: Alert Behavior During Therapy: WFL for tasks assessed/performed   PT - Cognitive impairments: No family/caregiver present to determine baseline                       PT - Cognition Comments: pt difficult to follow, looking around room for date and name of hospital, reports name, DOB and current year correctly; pt reports I lie all the time then laughs intermittently during eval with questions regarding PLOF, no family present Following commands: Intact       Cueing       General Comments General comments (skin integrity,  edema, etc.): HR 110 with sitting EOB and pt reports dizziness, returned to supine with HR 113/88 and SpO2 94% and HR 93 - notified RN    Exercises     Assessment/Plan    PT Assessment Patient needs continued PT services  PT Problem List Decreased strength;Decreased range of motion;Decreased activity tolerance;Decreased balance;Decreased mobility;Decreased knowledge of use of DME;Decreased safety awareness;Decreased knowledge of precautions;Cardiopulmonary status limiting activity;Pain;Obesity       PT Treatment Interventions DME instruction;Gait training;Functional mobility training;Therapeutic activities;Therapeutic exercise;Balance training;Patient/family education;Wheelchair mobility training    PT Goals (Current goals can be found in the Care Plan section)  Acute Rehab PT Goals Patient Stated Goal: I would rather just go home PT Goal Formulation: With patient Time For Goal Achievement: 01/29/24 Potential to Achieve Goals: Good    Frequency Min 2X/week     Co-evaluation  AM-PAC PT 6 Clicks Mobility  Outcome Measure Help needed turning from your back to your side while in a flat bed without using bedrails?: A Lot Help needed moving from lying on your back to sitting on the side of a flat bed without using bedrails?: A Lot Help needed moving to and from a bed to a chair (including a wheelchair)?: Total Help needed standing up from a chair using your arms (e.g., wheelchair or bedside chair)?: Total Help needed to walk in hospital room?: Total Help needed climbing 3-5 steps with a railing? : Total 6 Click Score: 8    End of Session   Activity Tolerance: Patient limited by pain;Other (comment) (dizziness) Patient left: in bed;with call bell/phone within reach;with bed alarm set Nurse Communication: Mobility status PT Visit Diagnosis: Muscle weakness (generalized) (M62.81);Pain;Difficulty in walking, not elsewhere classified (R26.2) Pain - Right/Left:  Right Pain - part of body: Knee    Time: 8860-8788 PT Time Calculation (min) (ACUTE ONLY): 32 min   Charges:   PT Evaluation $PT Eval Moderate Complexity: 1 Mod PT Treatments $Therapeutic Activity: 8-22 mins PT General Charges $$ ACUTE PT VISIT: 1 Visit         Tori Brentin Shin PT, DPT 01/15/24, 12:27 PM

## 2024-01-15 NOTE — Progress Notes (Signed)
   01/14/24 2300  BiPAP/CPAP/SIPAP  BiPAP/CPAP/SIPAP Pt Type Adult  BiPAP/CPAP/SIPAP Resmed  Mask Type Full face mask  Dentures removed? Not applicable  Mask Size Medium  EPAP 14 cmH2O  FiO2 (%) 21 %  Patient Home Machine No  Patient Home Mask No  Patient Home Tubing No  Auto Titrate No  Device Plugged into RED Power Outlet Yes  BiPAP/CPAP /SiPAP Vitals  Pulse Rate (!) 46  Resp 17  BP 103/63  SpO2 95 %  MEWS Score/Color  MEWS Score 1  MEWS Score Color Green

## 2024-01-15 NOTE — Progress Notes (Addendum)
 Progress Note  Patient Name: Alan Lin Date of Encounter: 01/15/2024 [facility] Cardiologist: None   Interval Summary    No complaints this morning, no longer complaining of leg pain.   Vital Signs Vitals:   01/14/24 1900 01/14/24 2000 01/14/24 2300 01/15/24 0536  BP:  130/81 103/63 115/65  Pulse: 93 100 (!) 46   Resp: (!) 21 (!) 23 17 14   Temp:  98.8 F (37.1 C)  97.9 F (36.6 C)  TempSrc:  Oral  Axillary  SpO2: 96% 92% 95% 96%  Weight:      Height:        Intake/Output Summary (Last 24 hours) at 01/15/2024 0754 Last data filed at 01/15/2024 0700 Gross per 24 hour  Intake 1047.05 ml  Output 1450 ml  Net -402.95 ml      01/13/2024    1:38 AM 11/20/2023    5:51 PM 10/22/2023    5:08 AM  Last 3 Weights  Weight (lbs) 240 lb 240 lb 241 lb 6.5 oz  Weight (kg) 108.863 kg 108.863 kg 109.5 kg      Telemetry/ECG   Atrial flutter rates 70-90s - Personally Reviewed  Physical Exam  GEN: No acute distress.   Neck: No JVD Cardiac: Irreg Irreg, no murmurs, rubs, or gallops.  Respiratory: Clear to auscultation bilaterally. GI: Soft, nontender, non-distended  MS: No edema  Assessment & Plan    68 y.o. 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 Celinda.   Persistent atrial flutter with RVR Hx of pSVT -- hx of the same, has not been on anticoagulation in the past 2/2 freq falls per outpatient notes -- placed on IV Diltiazem  on admission with addition of metoprolol  50mg  TID currently, rates 70-90 mostly this morning -- he continues to decline OAC, therefore unable to consider TEE/DCCV and given rates are improved with IV diltiazem  seems reasonable to  continue with rate control strategy. He did ask that I reach out to his PCP cardiologist today, call placed  -- ideally, avoid amiodarone  given lack of OAC unless he becomes unstable.  -- On IV diltiazem  and metoprolol  50mg  TID, will transition to Diltiazem  90mg  q6hr PO with plans to consolidate as long as BP remains stable. Would transition metoprolol  to 75mg  BID as BP tolerates   HLD -- on statin  HTN -- controlled -- transition to oral Diltiazem , switch metoprolol  as above   H/o subclinical hypothyroidism -- free t4 WNL, TSH 2.084  Per Primary Right knee pain DDD Osteoarthritis  OSA  BPH DM   For questions or updates, please contact [facility] Please consult [email or web address] for contact info under       Signed, Manuelita Rummer, NP   Patient seen and examined   I agree with findings as noted by L Rummer above    Pt with an atypical atrial flutter  on admit EKG   Repeat EKG pending  On admit pt was on Multaq   This has been held Has been on rate control   Rates have been improving    He declines anticoagulation (given hx of falls)   He is followed at [facility]  Will attempt to contact Dr Lyle     Would ambulate later and follow HR.  Follow BP    Vina Gull MD

## 2024-01-16 DIAGNOSIS — I4891 Unspecified atrial fibrillation: Secondary | ICD-10-CM | POA: Diagnosis not present

## 2024-01-16 LAB — GLUCOSE, CAPILLARY
Glucose-Capillary: 163 mg/dL — ABNORMAL HIGH (ref 70–99)
Glucose-Capillary: 189 mg/dL — ABNORMAL HIGH (ref 70–99)
Glucose-Capillary: 144 mg/dL — ABNORMAL HIGH (ref 70–99)
Glucose-Capillary: 159 mg/dL — ABNORMAL HIGH (ref 70–99)

## 2024-01-16 LAB — BASIC METABOLIC PANEL WITH GFR
Anion gap: 9 (ref 5–15)
BUN: 21 mg/dL (ref 8–23)
CO2: 23 mmol/L (ref 22–32)
Calcium: 9.1 mg/dL (ref 8.9–10.3)
Chloride: 106 mmol/L (ref 98–111)
Creatinine, Ser: 0.62 mg/dL (ref 0.61–1.24)
GFR, Estimated: >60 mL/min (ref >60)
Glucose, Bld: 167 mg/dL — ABNORMAL HIGH (ref 70–99)
Potassium: 3.3 mmol/L — ABNORMAL LOW (ref 3.5–5.1)
Sodium: 138 mmol/L (ref 135–145)

## 2024-01-16 LAB — CBC
HCT: 42.8 % (ref 39.0–52.0)
Hemoglobin: 13 g/dL (ref 13.0–17.0)
MCH: 25.9 pg — ABNORMAL LOW (ref 26.0–34.0)
MCHC: 30.4 g/dL (ref 30.0–36.0)
MCV: 85.3 fL (ref 80.0–100.0)
Platelets: 303 K/uL (ref 150–400)
RBC: 5.02 MIL/uL (ref 4.22–5.81)
RDW: 14.4 % (ref 11.5–15.5)
WBC: 11 K/uL — ABNORMAL HIGH (ref 4.0–10.5)
nRBC: 0 % (ref 0.0–0.2)

## 2024-01-16 MED ORDER — APIXABAN 5 MG PO TABS
5.0000 mg | ORAL_TABLET | Freq: Two times a day (BID) | ORAL | Status: DC
  Administered 2024-01-16 – 2024-01-19 (×6): 5 mg via ORAL
  Filled 2024-01-16 (×6): qty 1

## 2024-01-16 MED ORDER — POTASSIUM CHLORIDE CRYS ER 20 MEQ PO TBCR
40.0000 meq | EXTENDED_RELEASE_TABLET | Freq: Every day | ORAL | Status: AC
  Administered 2024-01-16: 40 meq via ORAL
  Filled 2024-01-16: qty 2

## 2024-01-16 MED ORDER — METOPROLOL TARTRATE 50 MG PO TABS
75.0000 mg | ORAL_TABLET | Freq: Two times a day (BID) | ORAL | Status: DC
  Administered 2024-01-16 – 2024-01-19 (×6): 75 mg via ORAL
  Filled 2024-01-16 (×6): qty 1

## 2024-01-16 MED ORDER — POLYETHYLENE GLYCOL 3350 17 G PO PACK
17.0000 g | PACK | Freq: Every day | ORAL | Status: DC
  Administered 2024-01-16 – 2024-01-19 (×4): 17 g via ORAL
  Filled 2024-01-16 (×4): qty 1

## 2024-01-16 MED ORDER — SENNA 8.6 MG PO TABS
1.0000 | ORAL_TABLET | Freq: Two times a day (BID) | ORAL | Status: DC
  Administered 2024-01-16 – 2024-01-19 (×7): 8.6 mg via ORAL
  Filled 2024-01-16 (×7): qty 1

## 2024-01-16 NOTE — [place] FL2 (Signed)
 [place]  MEDICAID FL2 LEVEL OF CARE FORM     IDENTIFICATION  Patient Name: Alan Lin Birthdate: 01/23/1956 Sex: male Admission Date (Current Location): 01/13/2024  [facility] and [place] Number:  Producer, television/film/video and Address:  [facility],  [address] [place], [place] [address]      Provider Number: [id number]  Attending Physician Name and Address:  Alan Held, MD  Relative Name and Phone Number:  Alan Lin(Brother)[phone]    Current Level of Care: Hospital Recommended Level of Care: Skilled Nursing Facility Prior Approval Number:    Date Approved/Denied:   PASRR Number: [id number]  Discharge Plan: SNF    Current Diagnoses: Patient Active Problem List   Diagnosis Date Noted   Atrial flutter with rapid ventricular response (HCC) 01/15/2024   Atrial fibrillation with RVR (HCC) 01/13/2024   Type 2 diabetes mellitus with hyperglycemia (HCC) 01/13/2024   Diabetic neuropathy (HCC) 11/30/2023   Generalized weakness 10/20/2023   Falls, initial encounter 10/20/2023   Acute cystitis 10/20/2023   Sepsis (HCC) 10/20/2023   Dehydration 10/20/2023   Paroxysmal atrial fibrillation (HCC) 10/20/2023   BPH (benign prostatic hyperplasia) 10/20/2023   Acute esophagitis 07/24/2022   Syncope 07/21/2022   Hypomagnesemia 07/21/2022   Hypokalemia 07/21/2022   Hyperlipidemia 07/21/2022   Essential hypertension 07/21/2022   Hypophosphatemia 07/21/2022   OSA (obstructive sleep apnea) 07/21/2022   Falls 07/21/2022   Rib fracture 07/21/2022   Degeneration of lumbar intervertebral disc 11/01/2019   DDD (degenerative disc disease), cervical 10/28/2019   Cervical radiculitis 09/20/2019   Displacement 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 09/20/2019   Anxiety 07/16/2019   Right hip pain 07/30/2018   Primary osteoarthritis of both knees 07/30/2018   Pain in buttock 07/30/2018   Abdominal weakness 07/24/2017   Pain in joint, shoulder region  11/29/2016   DM2 (diabetes mellitus, type 2) (HCC) 03/15/2016   Depressive disorder 07/03/2012    Orientation RESPIRATION BLADDER Height & Weight     Self, Time, Situation, Place  Normal Continent Weight: 108.9 kg Height:  5' 10 (177.8 cm)  BEHAVIORAL SYMPTOMS/MOOD NEUROLOGICAL BOWEL NUTRITION STATUS      Continent Diet (Heart healthy)  AMBULATORY STATUS COMMUNICATION OF NEEDS Skin   Limited Assist Verbally Normal                       Personal Care Assistance Level of Assistance  Bathing, Feeding, Dressing Bathing Assistance: Limited assistance Feeding assistance: Limited assistance Dressing Assistance: Limited assistance     Functional Limitations Info  Sight, Hearing, Speech Sight Info: Impaired (eyeglasses) Hearing Info: Adequate Speech Info: Adequate    SPECIAL CARE FACTORS FREQUENCY  PT (By licensed PT), OT (By licensed OT)     PT Frequency: 5x week OT Frequency: 5x week            Contractures Contractures Info: Not present    Additional Factors Info  Code Status, Allergies Code Status Info: Full Allergies Info: Metformin, Oxycodone, Oxycodone-acetaminophen , Lisinopril, Morphine, Morphine And Codeine           Current Medications (01/16/2024):  This is the current hospital active medication list Current Facility-Administered Medications  Medication Dose Route Frequency Provider Last Rate Last Admin   acetaminophen  (TYLENOL ) tablet 650 mg  650 mg Oral Q6H PRN Alan Alm Lot, MD   650 mg at 01/14/24 1150   Or   acetaminophen  (TYLENOL ) suppository 650 mg  650 mg Rectal Q6H PRN Alan Alm Lot, MD  apixaban  (ELIQUIS ) tablet 5 mg  5 mg Oral BID Ross, Paula V, MD       atorvastatin  (LIPITOR) tablet 10 mg  10 mg Oral Daily Alan Alm Lot, MD   10 mg at 01/16/24 1038   cyclobenzaprine  (FLEXERIL ) tablet 10 mg  10 mg Oral QHS Alan Alm Lot, MD   10 mg at 01/15/24 2206   diltiazem  (CARDIZEM ) tablet 90 mg  90 mg Oral Q6H Alan Shaver B, NP   90 mg at 01/16/24 1247   DULoxetine  (CYMBALTA ) DR capsule 60 mg  60 mg Oral Daily Alan Alm Lot, MD   60 mg at 01/16/24 1038   empagliflozin  (JARDIANCE ) tablet 10 mg  10 mg Oral Daily Alan Alm Lot, MD   10 mg at 01/16/24 1038   enoxaparin  (LOVENOX ) injection 40 mg  40 mg Subcutaneous Q24H Alan Alm Lot, MD   40 mg at 01/16/24 1038   gabapentin  (NEURONTIN ) capsule 1,500 mg  1,500 mg Oral QHS Alan Alm Lot, MD   1,500 mg at 01/15/24 2206   gabapentin  (NEURONTIN ) capsule 300 mg  300 mg Oral Daily Alan Alm Lot, MD   300 mg at 01/16/24 1038   insulin  aspart (novoLOG ) injection 0-15 Units  0-15 Units Subcutaneous TID WC Alan Alm Lot, MD   3 Units at 01/16/24 1247   metoprolol  tartrate (LOPRESSOR ) tablet 75 mg  75 mg Oral BID Ross, Paula V, MD       polyethylene glycol (MIRALAX  / GLYCOLAX ) packet 17 g  17 g Oral Daily Pokhrel, Laxman, MD   17 g at 01/16/24 1038   prochlorperazine  (COMPAZINE ) injection 10 mg  10 mg Intravenous Q6H PRN Alan Alm Lot, MD       senna [facility]) tablet 8.6 mg  1 tablet Oral BID Pokhrel, Laxman, MD   8.6 mg at 01/16/24 1038   tamsulosin  (FLOMAX ) capsule 0.4 mg  0.4 mg Oral Daily Alan Alm Lot, MD   0.4 mg at 01/16/24 1038   traZODone  (DESYREL ) tablet 50 mg  50 mg Oral QHS Alan Alm Lot, MD   50 mg at 01/15/24 2206     Discharge Medications: Please see discharge summary for a list of discharge medications.  Relevant Imaging Results:  Relevant Lab Results:   Additional Information SS#[id number]  Alan Lin, Alan Lin, [place]

## 2024-01-16 NOTE — Progress Notes (Signed)
  Progress Note  Patient Name: Alan Lin Date of Encounter: 01/16/2024 [facility] Cardiologist: None   Interval Summary    Denies CP  NO SOB   Vital Signs Vitals:   01/15/24 2015 01/16/24 0415 01/16/24 0800 01/16/24 1041  BP: 124/65 100/61 118/73 (!) 120/58  Pulse: (!) 101 (!) 55 (!) 46   Resp: 20 17 11 18   Temp: 98.7 F (37.1 C) 98.6 F (37 C)    TempSrc: Oral Axillary    SpO2: 93% 93% 94%   Weight:      Height:        Intake/Output Summary (Last 24 hours) at 01/16/2024 1107 Last data filed at 01/16/2024 1025 Gross per 24 hour  Intake 518.8 ml  Output 1650 ml  Net -1131.2 ml      01/13/2024    1:38 AM 11/20/2023    5:51 PM 10/22/2023    5:08 AM  Last 3 Weights  Weight (lbs) 240 lb 240 lb 241 lb 6.5 oz  Weight (kg) 108.863 kg 108.863 kg 109.5 kg      Telemetry/ECG   Atrial flutter rates 70-90s - Personally Reviewed  Physical Exam  GEN: No acute distress.   Neck: JVP is not increased Cardiac: Irreg irreg   No S3  o murmurs  Respiratory: Clear to auscultation bilaterally. GI: Soft, nontender, non-distended  MS: No edema  Assessment & Plan    68 y.o. 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 Celinda.   Persistent atrial flutter with RVR Hx of pSVT -- hx of the same, has not been on anticoagulation in the past due to freq falls -- placed on IV Diltiazem  on admission with addition of metoprolol  50mg  TID currently, rates 70-90 mostly this morning His rates are OK  but he is mostly in bed  I would ambulate with assist I spoke to him at length re anticoagulation.   It looks like he will be going to a rehab facilty   He should be monitored  while walking  I would recomm Eliquis  5 bid      If with ambulation his heart rate is not controlled then would recomm TEE/DCCV  The pt has demonstrated that he has failed Multaq    Amiodarone  discussed in past   Though to avoid due to borderline hypothyroidism    Will [facility] to contact [facility] L[facility]  Per patient, all his care would be free  HLD -- on statin  HTN --  remains controlled  H/o subclinical hypothyroidism -- free t4 WNL, TSH 2.084    For questions or updates, please contact [facility] Please consult [email or web address] for contact info under       Signed, Vina Gull, MD

## 2024-01-16 NOTE — Progress Notes (Signed)
   01/16/24 2213  BiPAP/CPAP/SIPAP  BiPAP/CPAP/SIPAP Pt Type Adult  BiPAP/CPAP/SIPAP DREAMSTATIOND  Mask Type Full face mask  Dentures removed? Not applicable  Mask Size Medium  EPAP 14 cmH2O  FiO2 (%) 21 %  Patient Home Machine No  Patient Home Mask No  Patient Home Tubing No  Auto Titrate No  Device Plugged into RED Power Outlet Yes

## 2024-01-16 NOTE — Discharge Instructions (Signed)

## 2024-01-16 NOTE — Evaluation (Signed)
 Occupational Therapy Evaluation Patient Details Name: Alan Lin MRN: [id number] DOB: June 17, 1956 Today's Date: 01/16/2024   History of Present Illness   Alan Lin is a 68 y.o. male admitted with afib with RVR; presents with complaints of R knee pain. PMH: osteoarthritis, type 2 diabetes, hyperlipidemia, hypomagnesemia, hypophosphatemia, hypertension, atrial fibrillation, BPH, degeneration of lumbar intervertebral disc, cervical DDD, cervical radiculitis, history of falls, obstructive sleep apnea, anxiety, history of sepsis, history of syncope, paroxysmal atrial fibrillation     Clinical Impressions PTA, patient lives as resident at [facility] and had been amb with rollator and completing ADL's until fall with R knee injury now decline and using w/c. Currently, patient presents with deficits outlined below (see OT Problem List for details) most significantly R knee pain, decreased activity tolerance, R knee ROM/strength and balance with higher level cognitive deficits limiting ADL's and functional mobility. Patient requires continued Acute care hospital level OT services to progress safety and functional performance and allow for discharge. Patient will benefit from continued inpatient follow up therapy, <3 hours/day.        If plan is discharge home, recommend the following:   Two people to help with walking and/or transfers;A lot of help with bathing/dressing/bathroom;Direct supervision/assist for medications management;Direct supervision/assist for financial management;Assist for transportation;Help with stairs or ramp for entrance;Supervision due to cognitive status     Functional Status Assessment   Patient has had a recent decline in their functional status and demonstrates the ability to make significant improvements in function in a reasonable and predictable amount of time.     Equipment Recommendations   None recommended by OT       Precautions/Restrictions   Precautions Precautions: Fall Restrictions Weight Bearing Restrictions Per Provider Order: No     Mobility Bed Mobility Overal bed mobility: Needs Assistance Bed Mobility: Supine to Sit, Sit to Supine     Supine to sit: Max assist, HOB elevated, Used rails Sit to supine: Max assist, Used rails   General bed mobility comments: max A to come to sitting EOB, pt needing cues for sequencing and hand placement to power up to sitting; max A to lift BLE back into bed and scoot up in bed with bedpad and bed assist; min A with BUE support for static sitting EOB    Transfers Overall transfer level: Needs assistance Equipment used: Ambulation equipment used Transfers: Sit to/from Stand Sit to Stand: Total assist, +2 physical assistance, +2 safety/equipment, From elevated surface, Via lift equipment           General transfer comment: R knee pain restricts STS      Balance Overall balance assessment: Needs assistance Sitting-balance support: Feet supported, Bilateral upper extremity supported Sitting balance-Leahy Scale: Fair Sitting balance - Comments: improved postural control for EOB   Standing balance support: Bilateral upper extremity supported, Reliant on assistive device for balance Standing balance-Leahy Scale: Zero Standing balance comment: unable to fully come upright with STEDY trail due to knee pain                           ADL either performed or assessed with clinical judgement   ADL Overall ADL's : Needs assistance/impaired Eating/Feeding: Set up;Bed level   Grooming: Wash/dry hands;Wash/dry face;Oral care;Contact guard assist;Sitting Grooming Details (indicate cue type and reason): and shaving EOB Upper Body Bathing: Minimal assistance;Sitting   Lower Body Bathing: Maximal assistance;Bed level   Upper Body Dressing : Minimal assistance;Bed level  Lower Body Dressing: Total assistance;Bed level     Toilet  Transfer Details (indicate cue type and reason): attempted STEDY transfer to [facility] with NT, patient with limited tolerance for any STS pressure on R knee Toileting- Clothing Manipulation and Hygiene: Moderate assistance Toileting - Clothing Manipulation Details (indicate cue type and reason): urinal use     Functional mobility during ADLs:  (see below for trial STS from EOB into STEDY) General ADL Comments: R knee pain limits ADL mobility to commode and STS     Vision Baseline Vision/History: 0 No visual deficits Vision Assessment?: No apparent visual deficits            Pertinent Vitals/Pain Pain Assessment Pain Assessment: 0-10 Pain Score: 4  Pain Location: R knee Pain Descriptors / Indicators: Throbbing, Sore, Grimacing, Guarding Pain Intervention(s): Limited activity within patient's tolerance, Premedicated before session, Repositioned, RN gave pain meds during session, Ice applied, Relaxation     Extremity/Trunk Assessment Upper Extremity Assessment Upper Extremity Assessment: Right hand dominant;Overall WFL for tasks assessed   Lower Extremity Assessment Lower Extremity Assessment: Defer to PT evaluation   Cervical / Trunk Assessment Cervical / Trunk Assessment: Normal   Communication Communication Communication: No apparent difficulties   Cognition Arousal: Alert Behavior During Therapy: WFL for tasks assessed/performed Cognition: History of cognitive impairments             OT - Cognition Comments: decreased insight and judgement into deficits and limitied STM                 Following commands: Intact       Cueing  General Comments   Cueing Techniques: Verbal cues  HR remained 90-95 this session EOB and STS trials, R knee red and warm to touch, mild edema           Home Living Family/patient expects to be discharged to:: Assisted living                             Home Equipment: Agricultural consultant (2 wheels);Wheelchair -  manual;Cane - single point;Grab bars - toilet;Grab bars - tub/shower   Additional Comments: [place]      Prior Functioning/Environment Prior Level of Function : Independent/Modified Independent;History of Falls (last six months)             Mobility Comments: pt reports no longer using 2 SPC since he fell beginning of May 2025, reports sold it in regards to rollator, ADLs Comments: pt reports ind with self care, facility provides meals    OT Problem List: Decreased strength;Decreased activity tolerance;Impaired balance (sitting and/or standing);Decreased cognition;Decreased safety awareness;Decreased knowledge of use of DME or AE;Decreased knowledge of precautions;Cardiopulmonary status limiting activity;Obesity;Pain;Increased edema   OT Treatment/Interventions: Self-care/ADL training;Therapeutic exercise;Energy conservation;DME and/or AE instruction;Therapeutic activities;Modalities;Cognitive remediation/compensation;Patient/family education;Balance training      OT Goals(Current goals can be found in the care plan section)   Acute Rehab OT Goals Patient Stated Goal: to get rid of the knee pain OT Goal Formulation: With patient Time For Goal Achievement: 01/30/24 Potential to Achieve Goals: Fair ADL Goals Pt Will Perform Lower Body Bathing: with min assist;with adaptive equipment;sitting/lateral leans Pt Will Perform Lower Body Dressing: with min assist;sitting/lateral leans;with adaptive equipment Pt Will Transfer to Toilet: with mod assist;with +2 assist;stand pivot transfer;bedside commode   OT Frequency:  Min 2X/week       AM-PAC OT 6 Clicks Daily Activity     Outcome Measure Help  from another person eating meals?: A Little Help from another person taking care of personal grooming?: A Little Help from another person toileting, which includes using toliet, bedpan, or urinal?: A Lot Help from another person bathing (including washing, rinsing, drying)?: A  Lot Help from another person to put on and taking off regular upper body clothing?: A Little Help from another person to put on and taking off regular lower body clothing?: A Lot 6 Click Score: 15   End of Session Equipment Utilized During Treatment: Gait belt;Rolling walker (2 wheels) Nurse Communication: Mobility status;Other (comment) (R knee pain, warmth)  Activity Tolerance: Patient limited by pain Patient left: in bed;with call bell/phone within reach;with bed alarm set;with nursing/sitter in room  OT Visit Diagnosis: Unsteadiness on feet (R26.81);History of falling (Z91.81);Muscle weakness (generalized) (M62.81);Pain;Cognitive communication deficit (R41.841) Symptoms and signs involving cognitive functions: Other cerebrovascular disease Pain - Right/Left: Right Pain - part of body: Knee                Time: 0750-0830 OT Time Calculation (min): 40 min Charges:  OT General Charges $OT Visit: 1 Visit OT Evaluation $OT Eval Low Complexity: 1 Low OT Treatments $Self Care/Home Management : 8-22 mins  Yariah Selvey OT/L Acute Rehabilitation Department  [phone]  01/16/2024, 9:16 AM

## 2024-01-16 NOTE — Progress Notes (Signed)
 PROGRESS NOTE  DOCTOR SHEAHAN FMW:[id number] DOB: 1956/06/04 DOA: 01/13/2024 PCP: Agustina Greig NOVAK, MD   LOS: 1 day   Brief narrative:  Alan Lin is a 68 y.o. male with past medical history of paroxysmal atrial fibrillation significant of osteoarthritis, type 2 diabetes, hyperlipidemia,  hypertension, atrial fibrillation, BPH, degeneration of lumbar intervertebral disc, cervical DDD, cervical radiculitis, history of falls, obstructive sleep apnea was brought from his home to the emergency department due to knee pain and right lower extremity pain..  In the ED, initial vitals were notable for tachycardia.  Labs showed CBC with WBC of 14.3. Troponin level was normal.  BNP 72.7 pg/mL.  BMP was unremarkable with the exception of glucose of 181 mg/dL. Phosphorus 3.2 and magnesium  1.9 mg/dL. Right knee x-ray showed no acute fracture or dislocation.  Chest x-ray without any infiltrate.  Patient was then admitted to the hospital for further evaluation and treatment.     Assessment/Plan:  Principal Problem:   Atrial fibrillation with RVR (HCC) Active Problems:   Anxiety   DDD (degenerative disc disease), cervical   Primary osteoarthritis of both knees   Degeneration of lumbar intervertebral disc   Hyperlipidemia   Essential hypertension   OSA (obstructive sleep apnea)   BPH (benign prostatic hyperplasia)   Depressive disorder   Diabetic neuropathy (HCC)   Type 2 diabetes mellitus with hyperglycemia (HCC)   Atrial flutter with rapid ventricular response (HCC)  Persistent atrial flutter with RVR CHA?DS?-VASc Score of at least 4.  Was initially on Cardizem  drip.  Has been changed to Cardizem  90 every 6 hourly from 01/07/2019.  Has been started on metoprolol  50 milligram 3 times daily.  Not a candidate for anticoagulation.   2D echocardiogram from 01/13/2024 with LV ejection fraction of 70 to 75% with no regional wall motion abnormalities. Cardiology on board, will follow recommendations. Unable to  use amiodarone  due to subclinical hypothyroidism.  Cardiology recommends ambulation and if uncontrolled TEE DC cardioversion.  Patient had limited mobility with PT  Right knee pain.   History of falls. Continue supportive care.  PT OT has recommended skilled nursing facility placement at this time.  Subclinical hypothyroidism.  Normal TSH 10/2023.  Free T4 within normal limits, recent TSH of 2.0.  Mild hypokalemia.   Will continue to replace.  Potassium today at 3.3.  Check levels in AM.   Anxiety/  Depressive disorder Continue Cymbalta  and trazodone      DDD (degenerative disc disease), cervical and lumbar On gabapentin  and duloxetine .  Will continue     Primary osteoarthritis of both knees Symptomatic care.  PT evaluation recommended skilled nursing facility placement.    Hyperlipidemia Continue Lipitor     Essential hypertension Continue metoprolol  and short acting Cardizem  at this time.  Cardiology adjusting medication.  Latest blood pressure 127/82    OSA (obstructive sleep apnea) CPAP at bedtime.     BPH (benign prostatic hyperplasia) Continue Flomax      Diabetic neuropathy  Continue gabapentin  and duloxetine      Type 2 diabetes mellitus with hyperglycemia  Continue sliding scale insulin  Jardiance .  Latest POC glucose  of 189.  Class I obesity. Body mass index is 34.44 kg/m.  Will benefit from lifestyle modification.   DVT prophylaxis:  apixaban  (ELIQUIS ) tablet 5 mg   Disposition: PT has recommended skilled nursing facility placement at this time.  Likely DC in 1 to 2 days when okay with cardiology.  Status is: Inpatient   Code Status:     Code  Status: Full Code  Family Communication: none   Consultants: Cardiology  Procedures: None  Anti-infectives:  none  Anti-infectives (From admission, onward)    None       Subjective: Today, patient was seen and examined at bedside.  Patient denies any nausea, vomiting, fever, chest pain, shortness of  breath or dyspnea.  PT has recommended skilled nursing facility placement at this time.  A flutter with RVR rhythm mostly okay during bed.  Objective: Vitals:   01/16/24 1247 01/16/24 1409  BP: 125/71 127/82  Pulse: 96 85  Resp: 18 17  Temp:  97.8 F (36.6 C)  SpO2: 97%     Intake/Output Summary (Last 24 hours) at 01/16/2024 1504 Last data filed at 01/16/2024 1412 Gross per 24 hour  Intake 278.8 ml  Output 1650 ml  Net -1371.2 ml   Filed Weights   01/13/24 0138  Weight: 108.9 kg   Body mass index is 34.44 kg/m.   Physical Exam:  GENERAL: Patient is alert awake and oriented. Not in obvious distress.  Obese build HENT: No scleral pallor or icterus. Pupils equally reactive to light. Oral mucosa is moist NECK: is supple, no gross swelling noted. CHEST: Clear to auscultation, Diminished breath sounds bilaterally. CVS: S1 and S2 heard, no murmur.  Irregularly irregular rhythm. ABDOMEN: Soft, non-tender, bowel sounds are present. EXTREMITIES: No edema. CNS: Cranial nerves are intact. No focal motor deficits. SKIN: warm and dry without rashes.  Data Review: I have personally reviewed the following laboratory data and studies,  CBC: Recent Labs  Lab 01/13/24 0305 01/14/24 0505 01/15/24 0444 01/16/24 0438  WBC 14.3* 12.8* 12.9* 11.0*  NEUTROABS 11.4*  --   --   --   HGB 14.3 14.5 12.5* 13.0  HCT 45.9 46.4 41.5 42.8  MCV 83.5 85.0 84.9 85.3  PLT 325 332 346 303   Basic Metabolic Panel: Recent Labs  Lab 01/13/24 0305 01/14/24 0505 01/15/24 0444 01/16/24 0438  NA 139 137 135 138  K 4.0 3.4* 3.8 3.3*  CL 99 100 101 106  CO2 28 26 23 23   GLUCOSE 181* 173* 181* 167*  BUN 16 24* 23 21  CREATININE 0.72 0.90 0.86 0.62  CALCIUM  10.1 9.4 9.0 9.1  MG 1.9  --  2.1  --   PHOS 3.2  --   --   --    Liver Function Tests: Recent Labs  Lab 01/13/24 1231  AST 13*  ALT 12  ALKPHOS 55  BILITOT 1.1  PROT 6.2*  ALBUMIN 2.9*   No results for input(s): LIPASE,  AMYLASE in the last 168 hours. No results for input(s): AMMONIA in the last 168 hours. Cardiac Enzymes: No results for input(s): CKTOTAL, CKMB, CKMBINDEX, TROPONINI in the last 168 hours. BNP (last 3 results) Recent Labs    01/13/24 0305  BNP 72.7    ProBNP (last 3 results) No results for input(s): PROBNP in the last 8760 hours.  CBG: Recent Labs  Lab 01/15/24 1208 01/15/24 1741 01/15/24 2141 01/16/24 0731 01/16/24 1146  GLUCAP 148* 146* 176* 163* 189*   No results found for this or any previous visit (from the past 240 hours).   Studies: No results found.     Vernal Alstrom, MD  [facility] 01/16/2024  If 7PM-7AM, please contact night-coverage

## 2024-01-16 NOTE — TOC Progression Note (Signed)
 Transition of Care [facility]) - Progression Note    Patient Details  Name: Alan Lin MRN: [id number] Date of Birth: 21-Jul-1955  Transition of Care [facility]) CM/SW Contact  Trinia Georgi, [name], RN Phone Number: 01/16/2024, 2:41 PM  Clinical Narrative: Patient agree to fax out for ST SNF;Has gone to [facility] in past. Await bed offers,choice, then [place].      Expected Discharge Plan: Skilled Nursing Facility Barriers to Discharge: Continued Medical Work up               Expected Discharge Plan and Services   Discharge Planning Services: CM Consult   Living arrangements for the past 2 months: Independent Living Facility                                       Social Drivers of Health (SDOH) Interventions SDOH Screenings   Food Insecurity: No Food Insecurity (01/14/2024)  Housing: Low Risk  (01/14/2024)  Transportation Needs: No Transportation Needs (01/14/2024)  Utilities: Not At Risk (01/14/2024)  Financial Resource Strain: Low Risk  (08/29/2023)   Received from [facility]  Physical Activity: Insufficiently Active (08/29/2023)   Received from [facility]  Social Connections: Unknown (01/13/2024)  Tobacco Use: Low Risk  (01/13/2024)  Health Literacy: Low Risk  (08/29/2023)   Received from [facility]    Readmission Risk Interventions     No data to display

## 2024-01-17 ENCOUNTER — Other Ambulatory Visit (HOSPITAL_COMMUNITY): Payer: Self-pay

## 2024-01-17 ENCOUNTER — Telehealth (HOSPITAL_COMMUNITY): Payer: Self-pay | Admitting: Pharmacy Technician

## 2024-01-17 DIAGNOSIS — E1165 Type 2 diabetes mellitus with hyperglycemia: Secondary | ICD-10-CM | POA: Diagnosis not present

## 2024-01-17 DIAGNOSIS — Z794 Long term (current) use of insulin: Secondary | ICD-10-CM

## 2024-01-17 DIAGNOSIS — I4891 Unspecified atrial fibrillation: Secondary | ICD-10-CM | POA: Diagnosis not present

## 2024-01-17 DIAGNOSIS — F419 Anxiety disorder, unspecified: Secondary | ICD-10-CM | POA: Diagnosis not present

## 2024-01-17 DIAGNOSIS — E66811 Obesity, class 1: Secondary | ICD-10-CM | POA: Insufficient documentation

## 2024-01-17 DIAGNOSIS — I10 Essential (primary) hypertension: Secondary | ICD-10-CM

## 2024-01-17 LAB — BASIC METABOLIC PANEL WITH GFR
Anion gap: 4 — ABNORMAL LOW (ref 5–15)
BUN: 22 mg/dL (ref 8–23)
CO2: 28 mmol/L (ref 22–32)
Calcium: 9.3 mg/dL (ref 8.9–10.3)
Chloride: 104 mmol/L (ref 98–111)
Creatinine, Ser: 0.78 mg/dL (ref 0.61–1.24)
GFR, Estimated: >60 mL/min (ref >60)
Glucose, Bld: 176 mg/dL — ABNORMAL HIGH (ref 70–99)
Potassium: 3.7 mmol/L (ref 3.5–5.1)
Sodium: 136 mmol/L (ref 135–145)

## 2024-01-17 LAB — CBC
HCT: 42.6 % (ref 39.0–52.0)
Hemoglobin: 13.2 g/dL (ref 13.0–17.0)
MCH: 26.4 pg (ref 26.0–34.0)
MCHC: 31 g/dL (ref 30.0–36.0)
MCV: 85.2 fL (ref 80.0–100.0)
Platelets: 342 K/uL (ref 150–400)
RBC: 5 MIL/uL (ref 4.22–5.81)
RDW: 14.2 % (ref 11.5–15.5)
WBC: 11.2 K/uL — ABNORMAL HIGH (ref 4.0–10.5)
nRBC: 0 % (ref 0.0–0.2)

## 2024-01-17 LAB — GLUCOSE, CAPILLARY
Glucose-Capillary: 188 mg/dL — ABNORMAL HIGH (ref 70–99)
Glucose-Capillary: 169 mg/dL — ABNORMAL HIGH (ref 70–99)
Glucose-Capillary: 164 mg/dL — ABNORMAL HIGH (ref 70–99)

## 2024-01-17 MED ORDER — POTASSIUM CHLORIDE CRYS ER 20 MEQ PO TBCR
40.0000 meq | EXTENDED_RELEASE_TABLET | ORAL | Status: AC
  Administered 2024-01-17 (×2): 40 meq via ORAL
  Filled 2024-01-17 (×2): qty 2

## 2024-01-17 MED ORDER — BISACODYL 10 MG RE SUPP
10.0000 mg | Freq: Once | RECTAL | Status: AC
  Administered 2024-01-17: 10 mg via RECTAL
  Filled 2024-01-17: qty 1

## 2024-01-17 NOTE — Progress Notes (Signed)
 Patient transferred from chair to bed x2 max assist, patient unable to stand and pivot, also unable to use stedy. HR up to 110's w/ this mobility. Patient denied chest pain or SOB. Tolerated fairly.

## 2024-01-17 NOTE — Subjective & Objective (Addendum)
 Pt seen and examined. Notes from cardiology reviewed. Pt feels weak. He wants to go to SNF. He has chosen [facility] for his rehab. He has been there before. He states he doesn't like the food there. I mentioned that he is going there for rehab and not the cuisine.  Gives him more reason to work hard with rehab so he can go home and eat.  Pt is agreeable to taking systemic anticoagulation. 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 He wants to go see cardiology at [facility]. Cards is making the referral arrangements.

## 2024-01-17 NOTE — Assessment & Plan Note (Addendum)
 Prior to 01-17-2024 on flomax  0.4 mg qd  01-17-2024 stable

## 2024-01-17 NOTE — Assessment & Plan Note (Addendum)
 Prior to 01-17-2024 remains on cymbalta  and trazodone   01-17-2024 stable.

## 2024-01-17 NOTE — Assessment & Plan Note (Signed)
Body mass index is 34.44 kg/m.

## 2024-01-17 NOTE — Assessment & Plan Note (Addendum)
 Prior to 01-17-2024 on cymbalta  60 mg daily  01-17-2024 continue cymbalta .

## 2024-01-17 NOTE — Progress Notes (Signed)
 Physical Therapy Treatment Patient Details Name: Alan Lin MRN: [id number] DOB: 09-09-1955 Today's Date: 01/17/2024   History of Present Illness Alan Lin is a 68 y.o. male admitted with afib with RVR; presents with complaints of R knee pain. PMH: osteoarthritis, type 2 diabetes, hyperlipidemia, hypomagnesemia, hypophosphatemia, hypertension, atrial fibrillation, BPH, degeneration of lumbar intervertebral disc, cervical DDD, cervical radiculitis, history of falls, obstructive sleep apnea, anxiety, history of sepsis, history of syncope, paroxysmal atrial fibrillation    PT Comments   Pt admitted with above diagnosis.  Pt currently with functional limitations due to the deficits listed below (see PT Problem List). Pt in bed when PT arrived. Pt agreeable to intervention. Pt reports ongoing R knee pain noted to remain in flexion throughout intervention and limiting mobility, pt ed provided on communicating pain to nursing staff and requesting pain medication and or ice, pt verbalized understanding. Pt required mod A x 2 with use of hospital bed and bed pad to transition to EOB, pt required mod A x 2 for sit to stand  from EOB-- pt unable to obtain upright extension posture and limited with standing tolerance ,<10s due to R knee pain, set up and scoot transfer bed to recliner with max A x 2 and strong multimodal cues for B UE and LE placement and to engage with therapeutic activity. Pt required A to reposition in the recliner. Pt left seated in recliner with all needs in place and nursing staff present due to R UE IV site actively bleeding.  Patient will benefit from continued inpatient follow up therapy, <3 hours/day. Pt will benefit from acute skilled PT to increase their independence and safety with mobility to allow discharge.      If plan is discharge home, recommend the following: A lot of help with bathing/dressing/bathroom;Assist for transportation;Two people to help with walking and/or  transfers;Assistance with cooking/housework;Help with stairs or ramp for entrance   Can travel by private vehicle     No  Equipment Recommendations  None recommended by PT    Recommendations for Other Services       Precautions / Restrictions Precautions Precautions: Fall Restrictions Weight Bearing Restrictions Per Provider Order: No     Mobility  Bed Mobility Overal bed mobility: Needs Assistance Bed Mobility: Supine to Sit     Supine to sit: HOB elevated, Used rails, Mod assist     General bed mobility comments: cues, increased time, A for R LE to EOB and trunk to midline, pt exhibiting strong R lateral lean in sitting, pt demonstrates difficulty scooting on EOB or while in chair requiring A x 2 with use of bed pad    Transfers Overall transfer level: Needs assistance Equipment used: Rolling walker (2 wheels) Transfers: Sit to/from Stand, Bed to chair/wheelchair/BSC Sit to Stand: From elevated surface, +2 physical assistance, +2 safety/equipment, Mod assist          Lateral/Scoot Transfers: Max assist, +2 physical assistance, +2 safety/equipment, From elevated surface General transfer comment: R knee pain restricts STS, pt limting R LE WB, and unable obtain upright standing with 2 atempts from elevated EOB, scoot transfer to R with recliner drop arm with max A x 2 pt exhibiting difficulty processing for placement of UE and LE with hand over hand A and PT placing B LE to advance minimal initation of movement and once in recliner max A x 2 to reposition with noted poor core stabiltiy with R lateral lean similar to unsupported sitting EOB    Ambulation/Gait  General Gait Details: NT   Stairs             Wheelchair Mobility     Tilt Bed    Modified Rankin (Stroke Patients Only)       Balance Overall balance assessment: Needs assistance Sitting-balance support: Feet supported, Bilateral upper extremity supported Sitting  balance-Leahy Scale: Fair   Postural control: Right lateral lean, Posterior lean Standing balance support: Bilateral upper extremity supported, Reliant on assistive device for balance Standing balance-Leahy Scale: Zero Standing balance comment: unable to fully come upright with 2 attepts with pull to stand at RW from elevated EOB                            Communication Communication Communication: No apparent difficulties  Cognition Arousal: Alert Behavior During Therapy: WFL for tasks assessed/performed   PT - Cognitive impairments: No family/caregiver present to determine baseline                       PT - Cognition Comments: pt difficult to follow, looking around room for date and name of hospital, reports name, DOB and current year correctly; pt reports I lie all the time then laughs intermittently during eval with questions regarding PLOF, no family present Following commands: Intact      Cueing Cueing Techniques: Verbal cues  Exercises      General Comments        Pertinent Vitals/Pain Pain Assessment Pain Assessment: 0-10 Pain Score: 7  Pain Location: R knee Pain Descriptors / Indicators: Throbbing, Sore, Grimacing, Guarding Pain Intervention(s): Limited activity within patient's tolerance, Monitored during session, Premedicated before session, Repositioned, Ice applied    Home Living                          Prior Function            PT Goals (current goals can now be found in the care plan section) Acute Rehab PT Goals Patient Stated Goal: I would rather just go home PT Goal Formulation: With patient Time For Goal Achievement: 01/29/24 Potential to Achieve Goals: Good Progress towards PT goals: Progressing toward goals (slowly due to R knee pain)    Frequency    Min 2X/week      PT Plan      Co-evaluation              AM-PAC PT 6 Clicks Mobility   Outcome Measure  Help needed turning from your back  to your side while in a flat bed without using bedrails?: A Lot Help needed moving from lying on your back to sitting on the side of a flat bed without using bedrails?: A Lot Help needed moving to and from a bed to a chair (including a wheelchair)?: Total Help needed standing up from a chair using your arms (e.g., wheelchair or bedside chair)?: Total Help needed to walk in hospital room?: Total Help needed climbing 3-5 steps with a railing? : Total 6 Click Score: 8    End of Session   Activity Tolerance: Patient limited by pain Patient left: with call bell/phone within reach;in chair;with chair alarm set;with nursing/sitter in room Nurse Communication: Mobility status PT Visit Diagnosis: Muscle weakness (generalized) (M62.81);Pain;Difficulty in walking, not elsewhere classified (R26.2) Pain - Right/Left: Right Pain - part of body: Knee     Time: 8892-8872 PT Time Calculation (min) (ACUTE ONLY): 20  min  Charges:    $Therapeutic Activity: 8-22 mins PT General Charges $$ ACUTE PT VISIT: 1 Visit                     [place], PT Acute Rehab    [place] VEAR Drone 01/17/2024, 3:10 PM

## 2024-01-17 NOTE — Assessment & Plan Note (Addendum)
 Prior to 01-17-2024 A1c 9.3% in May 2025  01-17-2024 send A1c in AM. Continue SSI and jardiance  10 mg qam

## 2024-01-17 NOTE — Progress Notes (Addendum)
 Progress Note  Patient Name: Alan Lin Date of Encounter: 01/17/2024 [facility] Cardiologist: None   Interval Summary   Denies any recent or worsening symptoms.  Denies noticing any symptoms with atrial fibrillation.  Did not get up out of bed yesterday.  Vital Signs Vitals:   01/16/24 2123 01/17/24 0020 01/17/24 0613 01/17/24 0924  BP:  122/79 129/82 97/60  Pulse: (!) 113  (!) 107   Resp:  15 20 16   Temp:  98 F (36.7 C) 98 F (36.7 C)   TempSrc:  Oral Oral   SpO2:   98%   Weight:      Height:        Intake/Output Summary (Last 24 hours) at 01/17/2024 0937 Last data filed at 01/17/2024 0412 Gross per 24 hour  Intake 360 ml  Output 1500 ml  Net -1140 ml      01/13/2024    1:38 AM 11/20/2023    5:51 PM 10/22/2023    5:08 AM  Last 3 Weights  Weight (lbs) 240 lb 240 lb 241 lb 6.5 oz  Weight (kg) 108.863 kg 108.863 kg 109.5 kg      Telemetry/ECG  Atrial flutter with heart rates in the 130s to 60s.  Heart rates were faster overnight and have slowed down during the day- Personally Reviewed  Physical Exam  GEN: No acute distress.   Neck: No JVD Cardiac: Irregularly irregular rhythm, no murmurs, rubs, or gallops.  Respiratory: Clear to auscultation bilaterally. GI: Soft, nontender, non-distended  MS: No edema  Assessment & Plan   68 y.o. 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 Celinda.    Persistent atrial flutter with RVR Hx of pSVT CHA2DS2-VASc Score = 4 [CHF History: 0, HTN History: 1, Diabetes History: 1, Stroke History: 0, Vascular Disease History: 1 (mild CAD, aortic atherosclerosis), Age Score: 1, Gender Score: 0].  Therefore, the  patient's annual risk of stroke is 4.8 %.    Has not been on anticoagulation in the past due to freq falls.  This is a difficult situation with the patient's CHADS2 Vasc score. The pt has previously failed Multaq    Amiodarone  discussed in past   Though to avoid due to borderline hypothyroidism. Was initially placed on IV Diltiazem , was transition to metoprolol .  Was transitioned over to metoprolol .  Is currently on metoprolol  75 mg twice daily and Cardizem  90 mg every 6 hours.  Heart rates are well-controlled in the 60s to 80s this morning but were poorly controlled with rates as high as the 130s overnight.  No doses of the metoprolol  had been missed.  Patient did wear CPAP for OSA overnight. Recommend ambulating with assist.  Patient planning to be discharged to to a rehab facilty   He should be monitored while walking due to history of frequent falls.  Patient reported that he did not get up and walk yesterday.  Would like him to get up and walk to assess heart rates.  Patient expressed her desire to be seen by cardiology at [facility] as he used to work there In the past. Dr. Okey contacted [facility] and patient has appointment for Friday.  Patient is able to get transport to this appointment. Continue Eliquis  5 mg twice daily Continue metoprolol   75 mg twice daily Cardizem  90 mg every 6 hours.  Plan to transition to long-lasting Cardizem  prior to discharge.     HLD Continue Crestor 10 mg twice daily    HTN Remains well controlled. Most recent blood pressure 97/60. Continue metoprolol  75 mg twice daily.    OSA on CPAP Patient reported wearing CPAP overnight.   Otherwise manage per primary    For questions or updates, please contact [facility] Please consult [email or web address] for contact info under       Signed, Morse Clause, PA-C   Pt seen and examined   He is comfortable in chair    Lungs CTA Cardiac   Irreg irreg   No S3   Ext are without edema   Impressione  I agree with  findings as noted by JENEANE Clause above  It is  interesting that HR is elevated at night more than day  (if CPAP fitting right) Would follow HR and BP as get up and works with PT   Continue b blocker and Ca blocker  Consolidate to daily dosing  I recomm Eliquis  bid    he will be observed in rehab    Will make sure he has follow up in afib clinc    Big decision will be continuing anticoagulation and then possible rhythm control  If HR controlled can go to rehab  Made appt for pt at [facility] (Dr Lyle)  will defer to September   Vina Gull MD

## 2024-01-17 NOTE — Assessment & Plan Note (Addendum)
 Prior to 01-17-2024 continue cymbalta  60 mg daily  01-17-2024 stable on cymbalta 

## 2024-01-17 NOTE — Assessment & Plan Note (Addendum)
 Prior to 01-17-2024 chlorthalidone, cozaar and norvasc  stopped. On lopressor  and cardizem  for rate control of afib, but also functions for BP control.  01-17-2024 continue lopressor  and cardizem .

## 2024-01-17 NOTE — Assessment & Plan Note (Signed)
 Prior to 01-17-2024  CHA?DS?-VASc Score of at least 4.  Was initially on Cardizem  drip.  Has been changed to Cardizem  90 every 6 hourly from 01/07/2019.  Has been started on metoprolol  50 milligram 3 times daily.  Not a candidate for anticoagulation.   2D echocardiogram from 01/13/2024 with LV ejection fraction of 70 to 75% with no regional wall motion abnormalities. Cardiology on board, will follow recommendations. Unable to use amiodarone  due to subclinical hypothyroidism.  Cardiology recommends ambulation and if uncontrolled TEE DC cardioversion.  Patient had limited mobility with PT   01-17-2024 pt remains on cardizem  90 mg q6h, lopressor  75 mg bid, Eliquis  5 mg bid. Cards management meds for rate control. Cards will refer pt to [facility] cardiology per pt request.

## 2024-01-17 NOTE — Assessment & Plan Note (Addendum)
 Prior to 01-17-2024 continue cpap at bedtime  01-17-2024 stable

## 2024-01-17 NOTE — TOC Progression Note (Signed)
 Transition of Care [facility]) - Progression Note    Patient Details  Name: Alan Lin MRN: [id number] Date of Birth: 09-01-1955  Transition of Care [facility]) CM/SW Contact  Courtlyn Aki, [name], RN Phone Number: 01/17/2024, 3:49 PM  Clinical Narrative: Patient chose [facility] rep [facility] accepted. Initiated shara pending shara [id number] auth.      Expected Discharge Plan: Skilled Nursing Facility Barriers to Discharge: Insurance Authorization               Expected Discharge Plan and Services   Discharge Planning Services: CM Consult   Living arrangements for the past 2 months: Independent Living Facility                                       Social Drivers of Health (SDOH) Interventions SDOH Screenings   Food Insecurity: No Food Insecurity (01/14/2024)  Housing: Low Risk  (01/14/2024)  Transportation Needs: No Transportation Needs (01/14/2024)  Utilities: Not At Risk (01/14/2024)  Financial Resource Strain: Low Risk  (08/29/2023)   Received from [facility]  Physical Activity: Insufficiently Active (08/29/2023)   [facility] from [facility]  Social Connections: Unknown (01/13/2024)  Tobacco Use: Low Risk  (01/13/2024)  Health Literacy: Low Risk  (08/29/2023)   Received from [facility]    Readmission Risk Interventions     No data to display

## 2024-01-17 NOTE — Progress Notes (Signed)
   01/17/24 2324  BiPAP/CPAP/SIPAP  BiPAP/CPAP/SIPAP Pt Type Adult  BiPAP/CPAP/SIPAP DREAMSTATIOND  Mask Type Full face mask  Dentures removed? Not applicable  Mask Size Medium  EPAP 14 cmH2O  FiO2 (%) 21 %  Patient Home Machine No  Patient Home Mask No  Patient Home Tubing No  Auto Titrate No  Device Plugged into RED Power Outlet Yes  BiPAP/CPAP /SiPAP Vitals  Pulse Rate 94  Resp 15  SpO2 92 %  MEWS Score/Color  MEWS Score 0  MEWS Score Color Landy

## 2024-01-17 NOTE — TOC Progression Note (Signed)
 Transition of Care [facility]) - Progression Note    Patient Details  Name: Alan Lin MRN: [id number] Date of Birth: [birth year]  Transition of Care [facility]) CM/SW Contact  Harveer Sadler, [name], RN Phone Number: 01/17/2024, 11:52 AM  Clinical Narrative:  Bed offers given await choice prior auth.    Request Status Stars Address Phone  Patient Preferred   [facility] Preferred SNF  Accepted 2 [address], [place] [address] [phone] --    [facility] [place] [place] Preferred SNF  Accepted 4 [address], [place] [address] [phone] --    [facility]  Accepted 2 [address], [place] [address] [phone] --    [facility]  Accepted 2 [address] [place] [address] [phone] --    [facility]  Accepted 1 [address] [place] [address] [phone] --    [facility]  Accepted 1 [address] [place] [address] [phone] --    [facility] SNF  Accepted 1 [address] [place] [address] [phone]                                     Expected Discharge Plan: Skilled Nursing Facility Barriers to Discharge: Continued Medical Work up               Expected Discharge Plan and Services   Discharge Planning Services: CM Consult   Living arrangements for the past 2 months: Independent Living Facility                                       Social Drivers of Health (SDOH) Interventions SDOH Screenings   Food Insecurity: No Food Insecurity (01/14/2024)  Housing: Low Risk  (01/14/2024)  Transportation Needs: No Transportation Needs (01/14/2024)  Utilities: Not At Risk (01/14/2024)  Financial Resource Strain: Low Risk  (08/29/2023)   [facility] from [facility]  Physical Activity: Insufficiently Active (08/29/2023)   [facility] from [facility]  Social Connections:  Unknown (01/13/2024)  Tobacco Use: Low Risk  (01/13/2024)  Health Literacy: Low Risk  (08/29/2023)   Received from [facility]    Readmission Risk Interventions     No data to display

## 2024-01-17 NOTE — Assessment & Plan Note (Addendum)
 Prior to 01-17-2024 on lipitor 10 mg qday  01-17-2024 stable on lipitor

## 2024-01-17 NOTE — Telephone Encounter (Signed)
 Patient Product/process development scientist completed.    The patient is insured through [employer]. Patient has Medicare and is not eligible for a copay card, but may be able to apply for patient assistance or Medicare RX Payment Plan (Patient Must reach out to their plan, if eligible for payment plan), if available.    Ran test claim for Eliquis 5 mg and the current 30 day co-pay is $0.00.   This test claim was processed through [facility]- copay amounts may vary at other pharmacies due to pharmacy/plan contracts, or as the patient moves through the different stages of their insurance plan.     Roland Earl, CPHT Pharmacy Technician III Certified Patient Advocate [facility] Patient Advocate Team Direct Number: [phone]  Fax: [phone]

## 2024-01-17 NOTE — Progress Notes (Signed)
 PROGRESS NOTE    Alan Lin  FMW:[id number] DOB: 10-Mar-1956 DOA: 01/13/2024 PCP: Agustina Greig NOVAK, MD  Subjective: Pt seen and examined. Notes from cardiology reviewed. Pt feels weak. He wants to go to SNF. He has chosen [facility] for his rehab. He has been there before. He states he doesn't like the food there. I mentioned that he is going there for rehab and not the cuisine.  Gives him more reason to work hard with rehab so he can go home and eat.  Pt is agreeable to taking systemic anticoagulation. 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 He wants to go see cardiology at [facility]. Cards is making the referral arrangements.   Hospital Course: Alan Lin is a 68 y.o. male with past medical history of paroxysmal atrial fibrillation significant of osteoarthritis, type 2 diabetes, hyperlipidemia,  hypertension, atrial fibrillation, BPH, degeneration of lumbar intervertebral disc, cervical DDD, cervical radiculitis, history of falls, obstructive sleep apnea was brought from his facility to the emergency department due to knee pain and right lower extremity pain..  In the ED initial vitals were notable for tachycardia.  Labs showed CBC with WBC of 14.3. Troponin level was normal.  BNP 72.7 pg/mL.  BMP was unremarkable with the exception of glucose of 181 mg/dL. Phosphorus 3.2 and magnesium  1.9 mg/dL. Right knee x-ray showed no acute fracture or dislocation.  Chest x-ray without any infiltrate.  [facility] was in [facility] for [facility] evaluation and treatment.    Persistent atrial flutter with RVR CHA?DS?-VASc Score of at least 4.  Currently on Cardizem  drip.  Not a candidate for anticoagulation.  On metoprolol  p.o. 3 times daily as well.  2D echocardiogram from 01/13/2024 with LV ejection fraction of 70 to 75% with no regional wall motion abnormalities. Cardiology has been consulted.  Unable to use amiodarone  due to subclinical hypothyroidism.  Right knee pain.  History of falls. Continue  supportive care.  PT evaluation.  Subclinical hypothyroidism.  Normal TSH 10/2023.  Check free T4.  TSH of 2.0.  Mild hypokalemia.   Potassium 3.4 today.  Will replenish orally.  Check levels in AM.   Anxiety/  Depressive disorder Continue Cymbalta  and trazodone      DDD (degenerative disc disease), cervical and lumbar On gabapentin  and duloxetine .  Will continue     Primary osteoarthritis of both knees Tylenol  and Toradol .  PT when he feels better.  Declined narcotic due to constipation.    Hyperlipidemia Continue Lipitor     Essential hypertension Continue metoprolol .  Has been started on Cardizem  so we will hold off with amlodipine . Will likely go home with long-acting Cardizem .  Blood pressure seems to be stable at this time.    OSA (obstructive sleep apnea) CPAP at bedtime.     BPH (benign prostatic hyperplasia) Continue Flomax      Diabetic neuropathy  Continue gabapentin  and duloxetine      Type 2 diabetes mellitus with hyperglycemia  Continue sliding scale insulin  Jardiance .  Latest POC glucose of 2 1    HPI: Alan Lin is a 68 y.o. male with medical history significant of osteoarthritis, type 2 diabetes, hyperlipidemia, hypomagnesemia, hypophosphatemia, hypertension, atrial fibrillation, BPH, degeneration of lumbar intervertebral disc, cervical DDD, cervical radiculitis, history of falls, obstructive sleep apnea, anxiety, history of sepsis, history of syncope, paroxysmal atrial fibrillation who was brought from his facility to the emergency department due to knee pain. The patient stated that he woke up in his RLE was out of position and very  painful.  He has mostly resolved since then. He endorses occasional palpitations, but no chest pain, diaphoresis, PND, orthopnea or pitting edema of the lower extremities. He has had generalized weakness and knees at the facility getting physical therapy. He has stated that his right knee and RLE in general are weak.  He denied  fever, chills, rhinorrhea, sore throat, wheezing or hemoptysis. No abdominal pain, nausea, emesis, diarrhea, constipation, melena or hematochezia. No flank pain, dysuria, frequency or hematuria. No polyuria, polydipsia, polyphagia or blurred vision.   ED course: Initial vital signs were temperature 98.3 F, pulse 147, respiration 18, BP 124/91 mmHg O2 sat 94% on room air.  The patient received metoprolol  5 mg IVP at 0355 and at 0 436 without significant response.   Significant Events: Admitted 01/13/2024 for rapid atrial fibrillation. Cards consulted for management of afib 01-13-2024 pericardial effusion found on echo 01-15-2024 pt refuses systemic anticoagulation. Therefore DCCV for his afib is not an option. 01-16-2024 pt now agreeable to Eliquis  bid. pt has demonstrated that he has failed Multaq  Amiodarone  discussed in past Though to avoid due to borderline hypothyroidism   Admission Labs: white count of 14.3, hemoglobin 14.3 g/dL and platelets 674.   Troponin level was normal.   BNP 72.7 pg/mL.   BMP was unremarkable with the exception of glucose of 181 mg/dL. Phosphorus 3.2 and magnesium  1.9 mg/dL.   Admission Imaging Studies: Right knee x-ray showed no acute fracture or dislocation.  There was advanced vascular calcification.   Portable 1 view chest radiograph showing low lung volumes without evidence of acute cardiopulmonary process.  There is aortic atherosclerosis.   Significant Labs:   Significant Imaging Studies: 01-13-2024 echo shows LVEF 70-75%.  Moderate pericardial effusion. No tamponade.   Antibiotic Therapy: Anti-infectives (From admission, onward)    None       Procedures:   Consultants: cardiology    Assessment and Plan: * Atrial fibrillation with RVR (HCC) Prior to 01-17-2024  CHA?DS?-VASc Score of at least 4.  Was initially on Cardizem  drip.  Has been changed to Cardizem  90 every 6 hourly from 01/07/2019.  Has been started on metoprolol  50 milligram 3 times  daily.  Not a candidate for anticoagulation.   2D echocardiogram from 01/13/2024 with LV ejection fraction of 70 to 75% with no regional wall motion abnormalities. Cardiology on board, will follow recommendations. Unable to use amiodarone  due to subclinical hypothyroidism.  Cardiology recommends ambulation and if uncontrolled TEE DC cardioversion.  Patient had limited mobility with PT   01-17-2024 pt remains on cardizem  90 mg q6h, lopressor  75 mg bid, Eliquis  5 mg bid. Cards management meds for rate control. Cards will refer pt to [facility] cardiology per pt request.  Type 2 diabetes mellitus with hyperglycemia (HCC) Prior to 01-17-2024 A1c 9.3% in May 2025  01-17-2024 send A1c in AM. Continue SSI and jardiance  10 mg qam  Diabetic neuropathy (HCC) Prior to 01-17-2024 continue cymbalta  60 mg daily  01-17-2024 stable on cymbalta   Depressive disorder Prior to 01-17-2024 on cymbalta  60 mg daily  01-17-2024 continue cymbalta .  BPH (benign prostatic hyperplasia) Prior to 01-17-2024 on flomax  0.4 mg qd  01-17-2024 stable  OSA (obstructive sleep apnea) Prior to 01-17-2024 continue cpap at bedtime  01-17-2024 stable  Essential hypertension Prior to 01-17-2024 chlorthalidone, cozaar and norvasc  stopped. On lopressor  and cardizem  for rate control of afib, but also functions for BP control.  01-17-2024 continue lopressor  and cardizem .  Hyperlipidemia Prior to 01-17-2024 on lipitor 10 mg qday  01-17-2024 stable  on lipitor  Anxiety Prior to 01-17-2024 remains on cymbalta  and trazodone   01-17-2024 stable.  Obesity, Class I, BMI 30-34.9 Body mass index is 34.44 kg/m.   DVT prophylaxis:  apixaban  (ELIQUIS ) tablet 5 mg     Code Status: Full Code Family Communication: no family at bedside. Pt is decisional. Disposition Plan: SNF Reason for continuing need for hospitalization: medically stable for DC to Snf.  Objective: Vitals:   01/17/24 1000 01/17/24 1100 01/17/24 1144 01/17/24 1332   BP:   104/61 126/84  Pulse:   67 96  Resp: 14 (!) 21 18 20   Temp:      TempSrc:      SpO2:   96% 94%  Weight:      Height:        Intake/Output Summary (Last 24 hours) at 01/17/2024 1625 Last data filed at 01/17/2024 1412 Gross per 24 hour  Intake 240 ml  Output 1500 ml  Net -1260 ml   Filed Weights   01/13/24 0138  Weight: 108.9 kg    Examination:  Physical Exam Vitals and nursing note reviewed.  Constitutional:      General: He is not in acute distress.    Appearance: He is obese. He is not ill-appearing, toxic-appearing or diaphoretic.  HENT:     Head: Normocephalic and atraumatic.     Nose: Nose normal.  Eyes:     General: No scleral icterus. Cardiovascular:     Rate and Rhythm: Normal rate. Rhythm irregular.  Pulmonary:     Effort: Pulmonary effort is normal. No respiratory distress.     Breath sounds: Normal breath sounds. No wheezing or rales.  Abdominal:     General: Bowel sounds are normal. There is no distension.     Palpations: Abdomen is soft.  Musculoskeletal:     Right lower leg: No edema.     Left lower leg: No edema.  Skin:    General: Skin is warm and dry.     Capillary Refill: Capillary refill takes less than 2 seconds.  Neurological:     General: No focal deficit present.     Mental Status: He is alert and oriented to person, place, and time.     Data Reviewed: I have personally reviewed following labs and imaging studies  CBC: Recent Labs  Lab 01/13/24 0305 01/14/24 0505 01/15/24 0444 01/16/24 0438 01/17/24 0516  WBC 14.3* 12.8* 12.9* 11.0* 11.2*  NEUTROABS 11.4*  --   --   --   --   HGB 14.3 14.5 12.5* 13.0 13.2  HCT 45.9 46.4 41.5 42.8 42.6  MCV 83.5 85.0 84.9 85.3 85.2  PLT 325 332 346 303 342   Basic Metabolic Panel: Recent Labs  Lab 01/13/24 0305 01/14/24 0505 01/15/24 0444 01/16/24 0438 01/17/24 0516  NA 139 137 135 138 136  K 4.0 3.4* 3.8 3.3* 3.7  CL 99 100 101 106 104  CO2 28 26 23 23 28   GLUCOSE 181* 173*  181* 167* 176*  BUN 16 24* 23 21 22   CREATININE 0.72 0.90 0.86 0.62 0.78  CALCIUM  10.1 9.4 9.0 9.1 9.3  MG 1.9  --  2.1  --   --   PHOS 3.2  --   --   --   --    GFR: Estimated Creatinine Clearance: 109.3 mL/min (by C-G formula based on SCr of 0.78 mg/dL). Liver Function Tests: Recent Labs  Lab 01/13/24 1231  AST 13*  ALT 12  ALKPHOS 55  BILITOT 1.1  PROT 6.2*  ALBUMIN 2.9*   BNP (last 3 results) Recent Labs    01/13/24 0305  BNP 72.7   CBG: Recent Labs  Lab 01/16/24 0731 01/16/24 1146 01/16/24 1657 01/16/24 2013 01/17/24 0743  GLUCAP 163* 189* 144* 159* 188*   Scheduled Meds:  apixaban   5 mg Oral BID   atorvastatin   10 mg Oral Daily   cyclobenzaprine   10 mg Oral QHS   diltiazem   90 mg Oral Q6H   DULoxetine   60 mg Oral Daily   empagliflozin   10 mg Oral Daily   gabapentin   1,500 mg Oral QHS   gabapentin   300 mg Oral Daily   insulin  aspart  0-15 Units Subcutaneous TID WC   metoprolol  tartrate  75 mg Oral BID   polyethylene glycol  17 g Oral Daily   senna  1 tablet Oral BID   tamsulosin   0.4 mg Oral Daily   traZODone   50 mg Oral QHS   Continuous Infusions:   LOS: 2 days   Time spent: 60 minutes  Camellia Door, DO  [facility]  01/17/2024, 4:25 PM

## 2024-01-18 DIAGNOSIS — I4891 Unspecified atrial fibrillation: Secondary | ICD-10-CM | POA: Diagnosis not present

## 2024-01-18 LAB — GLUCOSE, CAPILLARY
Glucose-Capillary: 138 mg/dL — ABNORMAL HIGH (ref 70–99)
Glucose-Capillary: 237 mg/dL — ABNORMAL HIGH (ref 70–99)
Glucose-Capillary: 156 mg/dL — ABNORMAL HIGH (ref 70–99)
Glucose-Capillary: 160 mg/dL — ABNORMAL HIGH (ref 70–99)
Glucose-Capillary: 148 mg/dL — ABNORMAL HIGH (ref 70–99)
Glucose-Capillary: 135 mg/dL — ABNORMAL HIGH (ref 70–99)

## 2024-01-18 LAB — MAGNESIUM: Magnesium: 1.9 mg/dL (ref 1.7–2.4)

## 2024-01-18 LAB — BASIC METABOLIC PANEL WITH GFR
Anion gap: 11 (ref 5–15)
BUN: 20 mg/dL (ref 8–23)
CO2: 21 mmol/L — ABNORMAL LOW (ref 22–32)
Calcium: 9.1 mg/dL (ref 8.9–10.3)
Chloride: 104 mmol/L (ref 98–111)
Creatinine, Ser: 0.36 mg/dL — ABNORMAL LOW (ref 0.61–1.24)
GFR, Estimated: >60 mL/min (ref >60)
Glucose, Bld: 166 mg/dL — ABNORMAL HIGH (ref 70–99)
Potassium: 4.1 mmol/L (ref 3.5–5.1)
Sodium: 136 mmol/L (ref 135–145)

## 2024-01-18 LAB — HEMOGLOBIN A1C
Hgb A1c MFr Bld: 6.7 % — ABNORMAL HIGH (ref 4.8–5.6)
Mean Plasma Glucose: 145.59 mg/dL

## 2024-01-18 MED ORDER — METHOCARBAMOL 500 MG PO TABS: 500.0000 mg | ORAL_TABLET | Freq: Three times a day (TID) | ORAL | Status: AC | PRN

## 2024-01-18 MED ORDER — DILTIAZEM HCL ER COATED BEADS 180 MG PO CP24
360.0000 mg | ORAL_CAPSULE | Freq: Every day | ORAL | Status: DC
  Administered 2024-01-19: 360 mg via ORAL
  Filled 2024-01-18: qty 2

## 2024-01-18 MED ORDER — APIXABAN 5 MG PO TABS: 5.0000 mg | ORAL_TABLET | Freq: Two times a day (BID) | ORAL | Status: DC

## 2024-01-18 MED ORDER — DILTIAZEM HCL ER COATED BEADS 360 MG PO CP24: 360.0000 mg | ORAL_CAPSULE | Freq: Every day | ORAL | Status: DC

## 2024-01-18 MED ORDER — METOPROLOL TARTRATE 75 MG PO TABS: 75.0000 mg | ORAL_TABLET | Freq: Two times a day (BID) | ORAL | Status: DC

## 2024-01-18 NOTE — Discharge Summary (Incomplete Revision)
 Physician Discharge Summary   Alan Lin FMW:[id number] DOB: 1955-08-15 DOA: 01/13/2024  PCP: Agustina Greig NOVAK, MD  Admit date: 01/13/2024 Discharge date: 01/18/2024  Admitted From: ILF Disposition:  SNF Discharging physician: Alm Apo, MD Barriers to discharge: none  Recommendations at discharge: Follow up with afib clinic until [facility] appt; possible DCCV after 4 weeks Eliquis  Adjust cardizem /lopressor  as needed   Discharge Condition: stable CODE STATUS: Full  Diet recommendation:  Diet Orders (From admission, onward)     Start     Ordered   01/18/24 0000  Diet - low sodium heart healthy        01/18/24 1307   01/18/24 0000  Diet Carb Modified        01/18/24 1307   01/13/24 0755  Diet heart healthy/carb modified Fluid consistency: Thin  Diet effective now       Question:  Fluid consistency:  Answer:  Thin   01/13/24 0755            Hospital Course: Alan Lin is a 68 y.o. male with PMH PAF, OA, DM II, HLD, HTN, BPH, lumbar degenerative disc disease, cervical DDD, history of falls, OSA who presented with knee pain and right lower extremity pains.  In the ED initial vitals were notable for tachycardia.  Labs showed CBC with WBC of 14.3. Troponin level was normal.  BNP 72.7 pg/mL.  BMP was unremarkable with the exception of glucose of 181 mg/dL. Phosphorus 3.2 and magnesium  1.9 mg/dL. Right knee x-ray showed no acute fracture or dislocation.  Chest x-ray without any infiltrate.   He was admitted for A-fib with RVR and cardiology was consulted.  Persistent atrial flutter with RVR CHA?DS?-VASc Score of at least 4.  Initially on Cardizem  drip.  Previously not on anticoagulation due to his fall risk but decision was made to initiate anticoagulation during hospitalization especially with bridging to SNF. - Medications adjusted by cardiology.  Transitioned to Lopressor  and Cardizem  at discharge  Right knee pain.  History of falls. Continue supportive care.   -  Continue with PT/OT  Subclinical hypothyroidism.  Normal TSH and normal FT4  Mild hypokalemia.   - Repleted   Anxiety/  Depressive disorder Continue Cymbalta  and trazodone    DDD (degenerative disc disease), cervical and lumbar On gabapentin  and duloxetine .  Will continue   Primary osteoarthritis of both knees Tylenol   Hyperlipidemia Continue Lipitor   Essential hypertension -Continue metoprolol  and Cardizem   OSA (obstructive sleep apnea) CPAP at bedtime.   BPH (benign prostatic hyperplasia) Continue Flomax    Diabetic neuropathy  Continue gabapentin  and duloxetine    Type 2 diabetes mellitus with hyperglycemia  -Continue Jardiance  and semaglutide  - Last A1c 6.7% on 01/18/2024   Principal Diagnosis: Atrial fibrillation with RVR [facility])  Discharge Diagnoses: Active Hospital Problems   Diagnosis Date Noted   Atrial fibrillation with RVR (HCC) 01/13/2024    Priority: High   Diabetic neuropathy (HCC) 11/30/2023    Priority: Low   BPH (benign prostatic hyperplasia) 10/20/2023    Priority: Low   Essential hypertension 07/21/2022    Priority: Low   Hyperlipidemia 07/21/2022    Priority: Low   OSA (obstructive sleep apnea) 07/21/2022    Priority: Low   Anxiety 07/16/2019    Priority: Low   Type 2 diabetes mellitus with hyperglycemia (HCC) 03/15/2016    Priority: Low   Depressive disorder 07/03/2012    Priority: Low   Obesity, Class I, BMI 30-34.9 01/17/2024    Resolved Hospital Problems  No resolved  problems to display.     Discharge Instructions     Amb referral to AFIB Clinic   Complete by: As directed    Diet - low sodium heart healthy   Complete by: As directed    Diet Carb Modified   Complete by: As directed    Increase activity slowly   Complete by: As directed       Allergies as of 01/18/2024       Reactions   Metformin Diarrhea, Nausea And Vomiting   Oxycodone Other (See Comments)   Constipation   Oxycodone-acetaminophen     Constipation    Lisinopril Cough   Morphine Itching   Other reaction(s): Other (See Comments)  Constipation   Morphine And Codeine Itching        Medication List     STOP taking these medications    amLODipine  10 MG tablet Commonly known as: NORVASC    bacitracin ointment   chlorthalidone 25 MG tablet Commonly known as: HYGROTON   losartan 50 MG tablet Commonly known as: COZAAR   metoprolol  succinate 25 MG 24 hr tablet Commonly known as: TOPROL -XL   Multaq  400 MG tablet Generic drug: dronedarone    potassium chloride  SA 20 MEQ tablet Commonly known as: KLOR-CON  M   sulfamethoxazole -trimethoprim  400-80 MG tablet Commonly known as: BACTRIM        TAKE these medications    acetaminophen  325 MG tablet Commonly known as: TYLENOL  Take 1 tablet (325 mg total) by mouth every 6 (six) hours as needed for mild pain (or Fever >/= 101).   apixaban  5 MG Tabs tablet Commonly known as: ELIQUIS  Take 1 tablet (5 mg total) by mouth 2 (two) times daily.   atorvastatin  10 MG tablet Commonly known as: LIPITOR Take 1 tablet by mouth daily.   BIOTIN PO Take 1 tablet by mouth daily.   cyclobenzaprine  10 MG tablet Commonly known as: FLEXERIL  Take 10 mg by mouth at bedtime.   diltiazem  360 MG 24 hr capsule Commonly known as: CARDIZEM  CD Take 1 capsule (360 mg total) by mouth daily. Start taking on: January 19, 2024   DSS 100 MG Caps Take 1 capsule by mouth daily.   DULoxetine  60 MG capsule Commonly known as: CYMBALTA  Take 1 capsule by mouth daily.   gabapentin  300 MG capsule Commonly known as: NEURONTIN  Take 300-1,500 mg by mouth daily. Take 300mg  (1 capsule) by mouth in the mornings and 1500mg  (5 capsules) in the evening.   GaviLAX 17 GM/SCOOP powder Generic drug: polyethylene glycol powder Take 17 g by mouth daily as needed for mild constipation or moderate constipation.   Jardiance  10 MG Tabs tablet Generic drug: empagliflozin  Take 10 mg by mouth daily.   magnesium  30 MG  tablet Take 30 mg by mouth in the morning.   methocarbamol  500 MG tablet Commonly known as: ROBAXIN  Take 1 tablet (500 mg total) by mouth every 8 (eight) hours as needed for muscle spasms. What changed: when to take this   Metoprolol  Tartrate 75 MG Tabs Take 1 tablet (75 mg total) by mouth 2 (two) times daily.   multivitamin with minerals tablet Take 1 tablet by mouth daily.   Rybelsus  7 MG Tabs Generic drug: Semaglutide  Take 1 tablet by mouth daily.   senna 8.6 MG tablet Commonly known as: SENOKOT Take 1 tablet by mouth daily.   tamsulosin  0.4 MG Caps capsule Commonly known as: FLOMAX  Take 0.4 mg by mouth daily.   traZODone  50 MG tablet Commonly known as: DESYREL  Take 50 mg  by mouth at bedtime.        Contact information for after-discharge care     Destination     [facility], [place] .   Service: Skilled Nursing Contact information: [address] [place]  [address] [phone]                    Allergies  Allergen Reactions   Metformin Diarrhea and Nausea And Vomiting   Oxycodone Other (See Comments)    Constipation   Oxycodone-Acetaminophen      Constipation   Lisinopril Cough   Morphine Itching    Other reaction(s): Other (See Comments)  Constipation   Morphine And Codeine Itching    Consultations: Cardiology  Procedures:   Discharge Exam: BP 115/72   Pulse 98   Temp 97.9 F (36.6 C) (Oral)   Resp (!) 22   Ht 5' 10 (1.778 m)   Wt 108.9 kg   SpO2 95%   BMI 34.44 kg/m  Physical Exam Constitutional:      General: He is not in acute distress.    Appearance: Normal appearance.  HENT:     Head: Normocephalic and atraumatic.     Mouth/Throat:     Mouth: Mucous membranes are moist.  Eyes:     Extraocular Movements: Extraocular movements intact.  Cardiovascular:     Rate and Rhythm: Normal rate. Rhythm irregular.  Pulmonary:     Effort: Pulmonary effort is normal. No respiratory distress.      Breath sounds: Normal breath sounds. No wheezing.  Abdominal:     General: Bowel sounds are normal. There is no distension.     Palpations: Abdomen is soft.     Tenderness: There is no abdominal tenderness.  Musculoskeletal:        General: Normal range of motion.     Cervical back: Normal range of motion and neck supple.  Skin:    General: Skin is warm and dry.  Neurological:     General: No focal deficit present.     Mental Status: He is alert.     Comments: Generalized weakness  Psychiatric:        Mood and Affect: Mood normal.        Behavior: Behavior normal.      The results of significant diagnostics from this hospitalization (including imaging, microbiology, ancillary and laboratory) are listed below for reference.   Microbiology: No results found for this or any previous visit (from the past 240 hours).   Labs: BNP (last 3 results) Recent Labs    01/13/24 0305  BNP 72.7   Basic Metabolic Panel: Recent Labs  Lab 01/13/24 0305 01/14/24 0505 01/15/24 0444 01/16/24 0438 01/17/24 0516 01/18/24 0522  NA 139 137 135 138 136 136  K 4.0 3.4* 3.8 3.3* 3.7 4.1  CL 99 100 101 106 104 104  CO2 28 26 23 23 28  21*  GLUCOSE 181* 173* 181* 167* 176* 166*  BUN 16 24* 23 21 22 20   CREATININE 0.72 0.90 0.86 0.62 0.78 0.36*  CALCIUM  10.1 9.4 9.0 9.1 9.3 9.1  MG 1.9  --  2.1  --   --  1.9  PHOS 3.2  --   --   --   --   --    Liver Function Tests: Recent Labs  Lab 01/13/24 1231  AST 13*  ALT 12  ALKPHOS 55  BILITOT 1.1  PROT 6.2*  ALBUMIN 2.9*   No results for input(s): LIPASE, AMYLASE  in the last 168 hours. No results for input(s): AMMONIA in the last 168 hours. CBC: Recent Labs  Lab 01/13/24 0305 01/14/24 0505 01/15/24 0444 01/16/24 0438 01/17/24 0516  WBC 14.3* 12.8* 12.9* 11.0* 11.2*  NEUTROABS 11.4*  --   --   --   --   HGB 14.3 14.5 12.5* 13.0 13.2  HCT 45.9 46.4 41.5 42.8 42.6  MCV 83.5 85.0 84.9 85.3 85.2  PLT 325 332 346 303 342    Cardiac Enzymes: No results for input(s): CKTOTAL, CKMB, CKMBINDEX, TROPONINI in the last 168 hours. BNP: Invalid input(s): POCBNP CBG: Recent Labs  Lab 01/17/24 1128 01/17/24 1640 01/17/24 2014 01/18/24 0732 01/18/24 1119  GLUCAP 237* 169* 164* 156* 160*   D-Dimer No results for input(s): DDIMER in the last 72 hours. Hgb A1c Recent Labs    01/18/24 0522  HGBA1C 6.7*   Lipid Profile No results for input(s): CHOL, HDL, LDLCALC, TRIG, CHOLHDL, LDLDIRECT in the last 72 hours. Thyroid  function studies No results for input(s): TSH, T4TOTAL, T3FREE, THYROIDAB in the last 72 hours.  Invalid input(s): FREET3 Anemia work up No results for input(s): VITAMINB12, FOLATE, FERRITIN, TIBC, IRON, RETICCTPCT in the last 72 hours. Urinalysis    Component Value Date/Time   COLORURINE YELLOW 01/15/2024 0700   APPEARANCEUR HAZY (A) 01/15/2024 0700   LABSPEC 1.029 01/15/2024 0700   PHURINE 5.0 01/15/2024 0700   GLUCOSEU >=500 (A) 01/15/2024 0700   HGBUR NEGATIVE 01/15/2024 0700   BILIRUBINUR NEGATIVE 01/15/2024 0700   KETONESUR NEGATIVE 01/15/2024 0700   PROTEINUR NEGATIVE 01/15/2024 0700   NITRITE NEGATIVE 01/15/2024 0700   LEUKOCYTESUR NEGATIVE 01/15/2024 0700   Sepsis Labs Recent Labs  Lab 01/14/24 0505 01/15/24 0444 01/16/24 0438 01/17/24 0516  WBC 12.8* 12.9* 11.0* 11.2*   Microbiology No results found for this or any previous visit (from the past 240 hours).  Procedures/Studies: ECHOCARDIOGRAM COMPLETE Result Date: 01/13/2024    ECHOCARDIOGRAM REPORT   Patient Name:   Alan Lin Date of Exam: 01/13/2024 Medical Rec #:  [id number]        Height:       70.0 in Accession #:    [id number]       Weight:       240.0 lb Date of Birth:  23-Dec-1955         BSA:          2.255 m Patient Age:    68 years         BP:           109/58 mmHg Patient Gender: M                HR:           142 bpm. Exam Location:  Inpatient Procedure: 2D  Echo, Cardiac Doppler, Color Doppler and Intracardiac            Opacification Agent (Both Spectral and Color Flow Doppler were            utilized during procedure). Indications:    I48.91* Unspeicified atrial fibrillation  History:        Patient has prior history of Echocardiogram examinations, most                 recent 07/22/2022. Abnormal ECG, Arrythmias:Atrial Fibrillation,                 Signs/Symptoms:Syncope; Risk Factors:Diabetes, Dyslipidemia and  Sleep Apnea.  Sonographer:    Ellouise Mose RDCS Referring Phys: [id number] Shalyn Koral MANUEL ORTIZ  Sonographer Comments: Technically difficult study due to poor echo windows. Image acquisition challenging due to patient body habitus and Image acquisition challenging due to respiratory motion. Extremely difficult study. Off axis apicals IMPRESSIONS  1. Left ventricular ejection fraction, by estimation, is 70 to 75%. Left ventricular ejection fraction by 2D MOD biplane is 72.6 %. The left ventricle has hyperdynamic function. The left ventricle has no regional wall motion abnormalities. There is mild  left ventricular hypertrophy. Left ventricular diastolic parameters are indeterminate.  2. Right ventricular systolic function is normal. The right ventricular size is mildly enlarged. Tricuspid regurgitation signal is inadequate for assessing PA pressure.  3. Moderate pericardial effusion, more adjacent to the RV - the IVC does not collapse, but there is no RA or RV collapse or exaggerated inflow variation to suggest tamponade - clinical correlation is advised, especially given the elevated HR. Moderate pericardial effusion. The pericardial effusion is circumferential. There is no evidence of cardiac tamponade.  4. The mitral valve is grossly normal. Trivial mitral valve regurgitation. No evidence of mitral stenosis. Moderate mitral annular calcification.  5. The aortic valve is tricuspid. Aortic valve regurgitation is not visualized. Aortic valve sclerosis is  present, with no evidence of aortic valve stenosis.  6. The inferior vena cava is dilated in size with <50% respiratory variability, suggesting right atrial pressure of 15 mmHg. Comparison(s): Changes from prior study are noted. 07/22/2022: LVEF 60-65% (technically difficult study, no pericardial effusion noted). A new pericardial effuison is noted. Conclusion(s)/Recommendation(s): Consider limited echo to re-assess effusion in 2-3 days. FINDINGS  Left Ventricle: Left ventricular ejection fraction, by estimation, is 70 to 75%. Left ventricular ejection fraction by 2D MOD biplane is 72.6 %. The left ventricle has hyperdynamic function. The left ventricle has no regional wall motion abnormalities. Definity  contrast agent was given IV to delineate the left ventricular endocardial borders. The left ventricular internal cavity size was normal in size. There is mild left ventricular hypertrophy. Left ventricular diastolic parameters are indeterminate. Right Ventricle: The right ventricular size is mildly enlarged. No increase in right ventricular wall thickness. Right ventricular systolic function is normal. Tricuspid regurgitation signal is inadequate for assessing PA pressure. Left Atrium: Left atrial size was normal in size. Right Atrium: Right atrial size was normal in size. Pericardium: Moderate pericardial effusion, more adjacent to the RV - the IVC does not collapse, but there is no RA or RV collapse or exaggerated inflow variation to suggest tamponade - clinical correlation is advised, especially given the elevated HR. A  moderately sized pericardial effusion is present. The pericardial effusion is circumferential. There is no evidence of cardiac tamponade. Mitral Valve: The mitral valve is grossly normal. Moderate mitral annular calcification. Trivial mitral valve regurgitation. No evidence of mitral valve stenosis. Tricuspid Valve: The tricuspid valve is normal in structure. Tricuspid valve regurgitation is  trivial. No evidence of tricuspid stenosis. Aortic Valve: The aortic valve is tricuspid. Aortic valve regurgitation is not visualized. Aortic valve sclerosis is present, with no evidence of aortic valve stenosis. Pulmonic Valve: The pulmonic valve was normal in structure. Pulmonic valve regurgitation is not visualized. No evidence of pulmonic stenosis. Aorta: The aortic root and ascending aorta are structurally normal, with no evidence of dilitation. Venous: The inferior vena cava is dilated in size with less than 50% respiratory variability, suggesting right atrial pressure of 15 mmHg. IAS/Shunts: No atrial level shunt detected by color flow Doppler.  LEFT VENTRICLE PLAX 2D                        Biplane EF (MOD) LVIDd:         4.70 cm         LV Biplane EF:   Left LVIDs:         3.30 cm                          ventricular LV PW:         1.10 cm                          ejection LV IVS:        0.90 cm                          fraction by LVOT diam:     2.40 cm                          2D MOD LV SV:         36                               biplane is LV SV Index:   16                               72.6 %. LVOT Area:     4.52 cm  LV Volumes (MOD) LV vol d, MOD    85.8 ml A2C: LV vol d, MOD    79.9 ml A4C: LV vol s, MOD    21.8 ml A2C: LV vol s, MOD    20.3 ml A4C: LV SV MOD A2C:   64.0 ml LV SV MOD A4C:   79.9 ml LV SV MOD BP:    62.1 ml RIGHT VENTRICLE             IVC RV S prime:     12.20 cm/s  IVC diam: 2.50 cm TAPSE (M-mode): 1.3 cm LEFT ATRIUM           Index        RIGHT ATRIUM           Index LA diam:      4.40 cm 1.95 cm/m   RA Area:     10.20 cm LA Vol (A2C): 40.8 ml 18.09 ml/m  RA Volume:   18.00 ml  7.98 ml/m LA Vol (A4C): 24.8 ml 11.00 ml/m  AORTIC VALVE LVOT Vmax:   70.60 cm/s LVOT Vmean:  43.800 cm/s LVOT VTI:    0.080 m  AORTA Ao Root diam: 3.40 cm Ao Asc diam:  3.40 cm MITRAL VALVE MV Area (PHT): 6.17 cm    SHUNTS MV Decel Time: 123 msec    Systemic VTI:  0.08 m MV E velocity: 69.50 cm/s   Systemic Diam: 2.40 cm MV A velocity: 48.10 cm/s MV E/A ratio:  1.44 Vinie Maxcy MD Electronically signed by Vinie Maxcy MD Signature Date/Time: 01/13/2024/1:56:46 PM    Final    DG Chest Port 1 View Result Date: 01/13/2024 CLINICAL DATA:  Atrial fibrillation with rapid ventricular response EXAM: PORTABLE CHEST 1 VIEW COMPARISON:  None Available. FINDINGS: Low lung  volumes. No pulmonary edema, pleural effusion or pneumothorax. No focal airspace infiltrate. Calcifications present in the transverse aorta consistent with aortic atherosclerotic vascular calcifications. IMPRESSION: Low lung volumes without evidence of acute cardiopulmonary process. Aortic Atherosclerosis (ICD10-I70.0). Electronically Signed   By: Wilkie Lent M.D.   On: 01/13/2024 08:26   DG Knee 2 Views Right Result Date: 01/13/2024 CLINICAL DATA:  Right knee pain EXAM: RIGHT KNEE - 1-2 VIEW COMPARISON:  None Available. FINDINGS: Frontal lateral radiographs are slightly limited by obliquity. Normal alignment. No acute fracture or dislocation. Joint spaces appear preserved on this limited examination. No effusion. Advanced vascular calcification. IMPRESSION: 1. No acute fracture or dislocation. 2. Advanced vascular calcification. Electronically Signed   By: Dorethia Molt M.D.   On: 01/13/2024 02:16     Time coordinating discharge: Over 30 minutes    Alm Apo, MD  [facility] 01/18/2024, 1:13 PM

## 2024-01-18 NOTE — Discharge Summary (Signed)
 Physician Discharge Summary   Alan Lin FMW:[id number] DOB: 08-Aug-1955 DOA: 01/13/2024  PCP: Agustina Greig NOVAK, MD  Admit date: 01/13/2024 Discharge date: 01/19/2024  Admitted From: ILF Disposition:  SNF Discharging physician: Alm Apo, MD Barriers to discharge: none  Recommendations at discharge: Follow up with afib clinic until [facility] appt; possible DCCV after 4 weeks Eliquis  Adjust cardizem /lopressor  as needed   Discharge Condition: stable CODE STATUS: Full  Diet recommendation:  Diet Orders (From admission, onward)     Start     Ordered   01/19/24 0000  Diet - low sodium heart healthy        01/19/24 0822   01/18/24 0000  Diet - low sodium heart healthy        01/18/24 1307   01/18/24 0000  Diet Carb Modified        01/18/24 1307   01/13/24 0755  Diet heart healthy/carb modified Fluid consistency: Thin  Diet effective now       Question:  Fluid consistency:  Answer:  Thin   01/13/24 0755            Hospital Course: Alan Lin is a 68 y.o. male with PMH PAF, OA, DM II, HLD, HTN, BPH, lumbar degenerative disc disease, cervical DDD, history of falls, OSA who presented with knee pain and right lower extremity pains.  In the ED initial vitals were notable for tachycardia.  Labs showed CBC with WBC of 14.3. Troponin level was normal.  BNP 72.7 pg/mL.  BMP was unremarkable with the exception of glucose of 181 mg/dL. Phosphorus 3.2 and magnesium  1.9 mg/dL. Right knee x-ray showed no acute fracture or dislocation.  Chest x-ray without any infiltrate.   He was admitted for A-fib with RVR and cardiology was consulted.  Persistent atrial flutter with RVR CHA?DS?-VASc Score of at least 4.  Initially on Cardizem  drip.  Previously not on anticoagulation due to his fall risk but decision was made to initiate anticoagulation during hospitalization especially with bridging to SNF - Medications adjusted by cardiology.  Transitioned to Lopressor  and Cardizem   Right knee pain.   History of falls. Continue supportive care.   - Continue with PT/OT  Subclinical hypothyroidism.  Normal TSH and normal FT4  Mild hypokalemia.   - Repleted  Anxiety/  Depressive disorder Continue Cymbalta  and trazodone    DDD (degenerative disc disease), cervical and lumbar On gabapentin  and duloxetine .  Will continue   Primary osteoarthritis of both knees Tylenol   Hyperlipidemia Continue Lipitor   Essential hypertension -Continue metoprolol  and Cardizem   OSA (obstructive sleep apnea) CPAP at bedtime.   BPH (benign prostatic hyperplasia) Continue Flomax    Diabetic neuropathy  Continue gabapentin  and duloxetine    Type 2 diabetes mellitus with hyperglycemia  -Continue Jardiance  and semaglutide  at discharge; SSI while inpatient  - Last A1c 6.7% on 01/18/2024   Principal Diagnosis: Atrial fibrillation with RVR [facility])  Discharge Diagnoses: Active Hospital Problems   Diagnosis Date Noted   Atrial fibrillation with RVR (HCC) 01/13/2024    Priority: High   Diabetic neuropathy (HCC) 11/30/2023    Priority: Low   BPH (benign prostatic hyperplasia) 10/20/2023    Priority: Low   Essential hypertension 07/21/2022    Priority: Low   Hyperlipidemia 07/21/2022    Priority: Low   OSA (obstructive sleep apnea) 07/21/2022    Priority: Low   Anxiety 07/16/2019    Priority: Low   Type 2 diabetes mellitus with hyperglycemia (HCC) 03/15/2016    Priority: Low   Depressive disorder  07/03/2012    Priority: Low   Obesity, Class I, BMI 30-34.9 01/17/2024    Resolved Hospital Problems  No resolved problems to display.     Discharge Instructions     Amb referral to AFIB Clinic   Complete by: As directed    Diet - low sodium heart healthy   Complete by: As directed    Diet - low sodium heart healthy   Complete by: As directed    Diet Carb Modified   Complete by: As directed    Increase activity slowly   Complete by: As directed    Increase activity slowly   Complete by:  As directed       Allergies as of 01/19/2024       Reactions   Metformin Diarrhea, Nausea And Vomiting   Oxycodone Other (See Comments)   Constipation   Oxycodone-acetaminophen     Constipation   Lisinopril Cough   Morphine Itching   Other reaction(s): Other (See Comments)  Constipation   Morphine And Codeine Itching        Medication List     STOP taking these medications    amLODipine  10 MG tablet Commonly known as: NORVASC    bacitracin ointment   chlorthalidone 25 MG tablet Commonly known as: HYGROTON   losartan 50 MG tablet Commonly known as: COZAAR   metoprolol  succinate 25 MG 24 hr tablet Commonly known as: TOPROL -XL   Multaq  400 MG tablet Generic drug: dronedarone    potassium chloride  SA 20 MEQ tablet Commonly known as: KLOR-CON  M   sulfamethoxazole -trimethoprim  400-80 MG tablet Commonly known as: BACTRIM        TAKE these medications    acetaminophen  325 MG tablet Commonly known as: TYLENOL  Take 1 tablet (325 mg total) by mouth every 6 (six) hours as needed for mild pain (or Fever >/= 101).   apixaban  5 MG Tabs tablet Commonly known as: ELIQUIS  Take 1 tablet (5 mg total) by mouth 2 (two) times daily.   atorvastatin  10 MG tablet Commonly known as: LIPITOR Take 1 tablet by mouth daily.   BIOTIN PO Take 1 tablet by mouth daily.   cyclobenzaprine  10 MG tablet Commonly known as: FLEXERIL  Take 10 mg by mouth at bedtime.   diltiazem  360 MG 24 hr capsule Commonly known as: CARDIZEM  CD Take 1 capsule (360 mg total) by mouth daily.   DSS 100 MG Caps Take 1 capsule by mouth daily.   DULoxetine  60 MG capsule Commonly known as: CYMBALTA  Take 1 capsule by mouth daily.   gabapentin  300 MG capsule Commonly known as: NEURONTIN  Take 300-1,500 mg by mouth daily. Take 300mg  (1 capsule) by mouth in the mornings and 1500mg  (5 capsules) in the evening.   GaviLAX 17 GM/SCOOP powder Generic drug: polyethylene glycol powder Take 17 g by mouth daily  as needed for mild constipation or moderate constipation.   Jardiance  10 MG Tabs tablet Generic drug: empagliflozin  Take 10 mg by mouth daily.   magnesium  30 MG tablet Take 30 mg by mouth in the morning.   methocarbamol  500 MG tablet Commonly known as: ROBAXIN  Take 1 tablet (500 mg total) by mouth every 8 (eight) hours as needed for muscle spasms. What changed: when to take this   metoprolol  tartrate 100 MG tablet Commonly known as: LOPRESSOR  Take 1 tablet (100 mg total) by mouth 2 (two) times daily.   multivitamin with minerals tablet Take 1 tablet by mouth daily.   Rybelsus  7 MG Tabs Generic drug: Semaglutide  Take 1 tablet by  mouth daily.   senna 8.6 MG tablet Commonly known as: SENOKOT Take 1 tablet by mouth daily.   tamsulosin  0.4 MG Caps capsule Commonly known as: FLOMAX  Take 0.4 mg by mouth daily.   traZODone  50 MG tablet Commonly known as: DESYREL  Take 50 mg by mouth at bedtime.        Contact information for after-discharge care     Destination     [facility] [place], [place] .   Service: Skilled Nursing Contact information: [address] [place]  [phone]                     Allergies  Allergen Reactions   Metformin Diarrhea and Nausea And Vomiting   Oxycodone Other (See Comments)    Constipation   Oxycodone-Acetaminophen      Constipation   Lisinopril Cough   Morphine Itching    Other reaction(s): Other (See Comments)  Constipation   Morphine And Codeine Itching    Consultations: Cardiology  Procedures:   Discharge Exam: BP 119/79   Pulse 72   Temp (!) 97.5 F (36.4 C) (Oral)   Resp 16   Ht 5' 10 (1.778 m)   Wt 108.9 kg   SpO2 94%   BMI 34.44 kg/m  Physical Exam Constitutional:      General: He is not in acute distress.    Appearance: Normal appearance.  HENT:     Head: Normocephalic and atraumatic.     Mouth/Throat:     Mouth: Mucous membranes are moist.  Eyes:      Extraocular Movements: Extraocular movements intact.  Cardiovascular:     Rate and Rhythm: Normal rate. Rhythm irregular.  Pulmonary:     Effort: Pulmonary effort is normal. No respiratory distress.     Breath sounds: Normal breath sounds. No wheezing.  Abdominal:     General: Bowel sounds are normal. There is no distension.     Palpations: Abdomen is soft.     Tenderness: There is no abdominal tenderness.  Musculoskeletal:        General: Normal range of motion.     Cervical back: Normal range of motion and neck supple.  Skin:    General: Skin is warm and dry.  Neurological:     General: No focal deficit present.     Mental Status: He is alert.     Comments: Generalized weakness  Psychiatric:        Mood and Affect: Mood normal.        Behavior: Behavior normal.      The results of significant diagnostics from this hospitalization (including imaging, microbiology, ancillary and laboratory) are listed below for reference.   Microbiology: No results found for this or any previous visit (from the past 240 hours).   Labs: BNP (last 3 results) Recent Labs    01/13/24 0305  BNP 72.7   Basic Metabolic Panel: Recent Labs  Lab 01/13/24 0305 01/14/24 0505 01/15/24 0444 01/16/24 0438 01/17/24 0516 01/18/24 0522  NA 139 137 135 138 136 136  K 4.0 3.4* 3.8 3.3* 3.7 4.1  CL 99 100 101 106 104 104  CO2 28 26 23 23 28  21*  GLUCOSE 181* 173* 181* 167* 176* 166*  BUN 16 24* 23 21 22 20   CREATININE 0.72 0.90 0.86 0.62 0.78 0.36*  CALCIUM  10.1 9.4 9.0 9.1 9.3 9.1  MG 1.9  --  2.1  --   --  1.9  PHOS 3.2  --   --   --   --   --  Liver Function Tests: Recent Labs  Lab 01/13/24 1231  AST 13*  ALT 12  ALKPHOS 55  BILITOT 1.1  PROT 6.2*  ALBUMIN 2.9*   No results for input(s): LIPASE, AMYLASE in the last 168 hours. No results for input(s): AMMONIA in the last 168 hours. CBC: Recent Labs  Lab 01/13/24 0305 01/14/24 0505 01/15/24 0444 01/16/24 0438  01/17/24 0516  WBC 14.3* 12.8* 12.9* 11.0* 11.2*  NEUTROABS 11.4*  --   --   --   --   HGB 14.3 14.5 12.5* 13.0 13.2  HCT 45.9 46.4 41.5 42.8 42.6  MCV 83.5 85.0 84.9 85.3 85.2  PLT 325 332 346 303 342   Cardiac Enzymes: No results for input(s): CKTOTAL, CKMB, CKMBINDEX, TROPONINI in the last 168 hours. BNP: Invalid input(s): POCBNP CBG: Recent Labs  Lab 01/18/24 1119 01/18/24 1655 01/18/24 2155 01/19/24 0809 01/19/24 1111  GLUCAP 160* 148* 135* 152* 198*   D-Dimer No results for input(s): DDIMER in the last 72 hours. Hgb A1c Recent Labs    01/18/24 0522  HGBA1C 6.7*   Lipid Profile No results for input(s): CHOL, HDL, LDLCALC, TRIG, CHOLHDL, LDLDIRECT in the last 72 hours. Thyroid  function studies No results for input(s): TSH, T4TOTAL, T3FREE, THYROIDAB in the last 72 hours.  Invalid input(s): FREET3 Anemia work up No results for input(s): VITAMINB12, FOLATE, FERRITIN, TIBC, IRON, RETICCTPCT in the last 72 hours. Urinalysis    Component Value Date/Time   COLORURINE YELLOW 01/15/2024 0700   APPEARANCEUR HAZY (A) 01/15/2024 0700   LABSPEC 1.029 01/15/2024 0700   PHURINE 5.0 01/15/2024 0700   GLUCOSEU >=500 (A) 01/15/2024 0700   HGBUR NEGATIVE 01/15/2024 0700   BILIRUBINUR NEGATIVE 01/15/2024 0700   KETONESUR NEGATIVE 01/15/2024 0700   PROTEINUR NEGATIVE 01/15/2024 0700   NITRITE NEGATIVE 01/15/2024 0700   LEUKOCYTESUR NEGATIVE 01/15/2024 0700   Sepsis Labs Recent Labs  Lab 01/14/24 0505 01/15/24 0444 01/16/24 0438 01/17/24 0516  WBC 12.8* 12.9* 11.0* 11.2*   Microbiology No results found for this or any previous visit (from the past 240 hours).  Procedures/Studies: ECHOCARDIOGRAM COMPLETE Result Date: 01/13/2024    ECHOCARDIOGRAM REPORT   Patient Name:   Edith Groleau KORAN SEABROOK Date of Exam: 01/13/2024 Medical Rec #:  [id number]        Height:       70.0 in Accession #:    [id number]       Weight:       240.0 lb  Date of Birth:  Nov 16, 1955         BSA:          2.255 m Patient Age:    68 years         BP:           109/58 mmHg Patient Gender: M                HR:           142 bpm. Exam Location:  Inpatient Procedure: 2D Echo, Cardiac Doppler, Color Doppler and Intracardiac            Opacification Agent (Both Spectral and Color Flow Doppler were            utilized during procedure). Indications:    I48.91* Unspeicified atrial fibrillation  History:        Patient has prior history of Echocardiogram examinations, most                 recent 07/22/2022. Abnormal ECG, Arrythmias:Atrial  Fibrillation,                 Signs/Symptoms:Syncope; Risk Factors:Diabetes, Dyslipidemia and                 Sleep Apnea.  Sonographer:    Ellouise Mose RDCS Referring Phys: [id number] Jernee Murtaugh MANUEL ORTIZ  Sonographer Comments: Technically difficult study due to poor echo windows. Image acquisition challenging due to patient body habitus and Image acquisition challenging due to respiratory motion. Extremely difficult study. Off axis apicals IMPRESSIONS  1. Left ventricular ejection fraction, by estimation, is 70 to 75%. Left ventricular ejection fraction by 2D MOD biplane is 72.6 %. The left ventricle has hyperdynamic function. The left ventricle has no regional wall motion abnormalities. There is mild  left ventricular hypertrophy. Left ventricular diastolic parameters are indeterminate.  2. Right ventricular systolic function is normal. The right ventricular size is mildly enlarged. Tricuspid regurgitation signal is inadequate for assessing PA pressure.  3. Moderate pericardial effusion, more adjacent to the RV - the IVC does not collapse, but there is no RA or RV collapse or exaggerated inflow variation to suggest tamponade - clinical correlation is advised, especially given the elevated HR. Moderate pericardial effusion. The pericardial effusion is circumferential. There is no evidence of cardiac tamponade.  4. The mitral valve is grossly normal.  Trivial mitral valve regurgitation. No evidence of mitral stenosis. Moderate mitral annular calcification.  5. The aortic valve is tricuspid. Aortic valve regurgitation is not visualized. Aortic valve sclerosis is present, with no evidence of aortic valve stenosis.  6. The inferior vena cava is dilated in size with <50% respiratory variability, suggesting right atrial pressure of 15 mmHg. Comparison(s): Changes from prior study are noted. 07/22/2022: LVEF 60-65% (technically difficult study, no pericardial effusion noted). A new pericardial effuison is noted. Conclusion(s)/Recommendation(s): Consider limited echo to re-assess effusion in 2-3 days. FINDINGS  Left Ventricle: Left ventricular ejection fraction, by estimation, is 70 to 75%. Left ventricular ejection fraction by 2D MOD biplane is 72.6 %. The left ventricle has hyperdynamic function. The left ventricle has no regional wall motion abnormalities. Definity  contrast agent was given IV to delineate the left ventricular endocardial borders. The left ventricular internal cavity size was normal in size. There is mild left ventricular hypertrophy. Left ventricular diastolic parameters are indeterminate. Right Ventricle: The right ventricular size is mildly enlarged. No increase in right ventricular wall thickness. Right ventricular systolic function is normal. Tricuspid regurgitation signal is inadequate for assessing PA pressure. Left Atrium: Left atrial size was normal in size. Right Atrium: Right atrial size was normal in size. Pericardium: Moderate pericardial effusion, more adjacent to the RV - the IVC does not collapse, but there is no RA or RV collapse or exaggerated inflow variation to suggest tamponade - clinical correlation is advised, especially given the elevated HR. A  moderately sized pericardial effusion is present. The pericardial effusion is circumferential. There is no evidence of cardiac tamponade. Mitral Valve: The mitral valve is grossly normal.  Moderate mitral annular calcification. Trivial mitral valve regurgitation. No evidence of mitral valve stenosis. Tricuspid Valve: The tricuspid valve is normal in structure. Tricuspid valve regurgitation is trivial. No evidence of tricuspid stenosis. Aortic Valve: The aortic valve is tricuspid. Aortic valve regurgitation is not visualized. Aortic valve sclerosis is present, with no evidence of aortic valve stenosis. Pulmonic Valve: The pulmonic valve was normal in structure. Pulmonic valve regurgitation is not visualized. No evidence of pulmonic stenosis. Aorta: The aortic root and ascending aorta are structurally  normal, with no evidence of dilitation. Venous: The inferior vena cava is dilated in size with less than 50% respiratory variability, suggesting right atrial pressure of 15 mmHg. IAS/Shunts: No atrial level shunt detected by color flow Doppler.  LEFT VENTRICLE PLAX 2D                        Biplane EF (MOD) LVIDd:         4.70 cm         LV Biplane EF:   Left LVIDs:         3.30 cm                          ventricular LV PW:         1.10 cm                          ejection LV IVS:        0.90 cm                          fraction by LVOT diam:     2.40 cm                          2D MOD LV SV:         36                               biplane is LV SV Index:   16                               72.6 %. LVOT Area:     4.52 cm  LV Volumes (MOD) LV vol d, MOD    85.8 ml A2C: LV vol d, MOD    79.9 ml A4C: LV vol s, MOD    21.8 ml A2C: LV vol s, MOD    20.3 ml A4C: LV SV MOD A2C:   64.0 ml LV SV MOD A4C:   79.9 ml LV SV MOD BP:    62.1 ml RIGHT VENTRICLE             IVC RV S prime:     12.20 cm/s  IVC diam: 2.50 cm TAPSE (M-mode): 1.3 cm LEFT ATRIUM           Index        RIGHT ATRIUM           Index LA diam:      4.40 cm 1.95 cm/m   RA Area:     10.20 cm LA Vol (A2C): 40.8 ml 18.09 ml/m  RA Volume:   18.00 ml  7.98 ml/m LA Vol (A4C): 24.8 ml 11.00 ml/m  AORTIC VALVE LVOT Vmax:   70.60 cm/s LVOT Vmean:   43.800 cm/s LVOT VTI:    0.080 m  AORTA Ao Root diam: 3.40 cm Ao Asc diam:  3.40 cm MITRAL VALVE MV Area (PHT): 6.17 cm    SHUNTS MV Decel Time: 123 msec    Systemic VTI:  0.08 m MV E velocity: 69.50 cm/s  Systemic Diam: 2.40 cm MV A velocity: 48.10 cm/s MV E/A ratio:  1.44 Vinie Maxcy MD Electronically signed by Vinie Maxcy MD Signature  Date/Time: 01/13/2024/1:56:46 PM    Final    DG Chest Port 1 View Result Date: 01/13/2024 CLINICAL DATA:  Atrial fibrillation with rapid ventricular response EXAM: PORTABLE CHEST 1 VIEW COMPARISON:  None Available. FINDINGS: Low lung volumes. No pulmonary edema, pleural effusion or pneumothorax. No focal airspace infiltrate. Calcifications present in the transverse aorta consistent with aortic atherosclerotic vascular calcifications. IMPRESSION: Low lung volumes without evidence of acute cardiopulmonary process. Aortic Atherosclerosis (ICD10-I70.0). Electronically Signed   By: Wilkie Lent M.D.   On: 01/13/2024 08:26   DG Knee 2 Views Right Result Date: 01/13/2024 CLINICAL DATA:  Right knee pain EXAM: RIGHT KNEE - 1-2 VIEW COMPARISON:  None Available. FINDINGS: Frontal lateral radiographs are slightly limited by obliquity. Normal alignment. No acute fracture or dislocation. Joint spaces appear preserved on this limited examination. No effusion. Advanced vascular calcification. IMPRESSION: 1. No acute fracture or dislocation. 2. Advanced vascular calcification. Electronically Signed   By: Dorethia Molt M.D.   On: 01/13/2024 02:16     Time coordinating discharge: Over 30 minutes    Alm Apo, MD  [facility] 01/19/2024, 2:24 PM

## 2024-01-18 NOTE — TOC Progression Note (Signed)
 Transition of Care [facility]) - Progression Note    Patient Details  Name: Alan Lin MRN: [id number] Date of Birth: [birth year]  [facility] CM/SW Contact  Laxmi Choung, [name], RN Phone Number: 01/18/2024, 3:00 PM  Clinical Narrative:  Patient declining going to [facility] aware. Patient wants to return back to [facility] living rep Kirsten/Melanie stated they are not a ST SNF facility on speaker phone to patient but patient can contact his caregiver Fredie Cable whom patient called & she agreed to provide max asst x2 for patient. Patient also has 1 caregiver but not 24/7, its in shifts.If returning back to [facility] living will need HHC orders. Already has dme-hospital bed, w/c, rw. [facility] rep Glenys has been made aware of patient's decision to not go to [place] even though auth received.PTAR has been cancelled. MD updated-d/c cancelled & Dr will talk to patient about d/c plan.      Expected Discharge Plan: Home/Self Care Barriers to Discharge: Continued Medical Work up               Expected Discharge Plan and Services   Discharge Planning Services: CM Consult   Living arrangements for the past 2 months: Independent Living Facility Expected Discharge Date: 01/18/24                                     Social Drivers of Health (SDOH) Interventions SDOH Screenings   Food Insecurity: No Food Insecurity (01/14/2024)  Housing: Low Risk  (01/14/2024)  Transportation Needs: No Transportation Needs (01/14/2024)  Utilities: Not At Risk (01/14/2024)  Financial Resource Strain: Low Risk  (08/29/2023)   Received from [facility]  Physical Activity: Insufficiently Active (08/29/2023)   Received from [facility]  Social Connections: Unknown (01/13/2024)  Tobacco Use: Low Risk  (01/13/2024)  Health Literacy: Low Risk  (08/29/2023)   Received from [facility]    Readmission Risk Interventions     No data to display

## 2024-01-18 NOTE — TOC Progression Note (Signed)
 Transition of Care [facility]) - Progression Note    Patient Details  Name: Alan Lin MRN: [id number] Date of Birth: 17-Jun-1956  Transition [facility]) CM/SW Contact  Jerie Basford, [name], RN Phone Number: 01/18/2024, 11:47 AM  Clinical Narrative:  [place] [facility] plan [facility] pi#[phone], barrows pi#[phone] till 8/4 25 for [facility] rep Tanya aware. MD updated awaiting medical stability.     Expected Discharge Plan: Skilled Nursing Facility Barriers to Discharge: No Barriers Identified               Expected Discharge Plan and Services   Discharge Planning Services: CM Consult   Living arrangements for the past 2 months: Independent Living Facility                                       Social Drivers of Health (SDOH) Interventions SDOH Screenings   Food Insecurity: No Food Insecurity (01/14/2024)  Housing: Low Risk  (01/14/2024)  Transportation Needs: No Transportation Needs (01/14/2024)  Utilities: Not At Risk (01/14/2024)  Financial Resource Strain: Low Risk  (08/29/2023)   [facility] from [facility]  Physical Activity: Insufficiently Active (08/29/2023)   Received from [facility]  Social Connections: Unknown (01/13/2024)  Tobacco Use: Low Risk  (01/13/2024)  Health Literacy: Low Risk  (08/29/2023)   Received from [facility]    Readmission Risk Interventions     No data to display

## 2024-01-18 NOTE — Progress Notes (Signed)
 Report called to [facility]. VSS and no acute changes noted at this time

## 2024-01-18 NOTE — Progress Notes (Signed)
 Progress Note  Patient Name: Alan Lin Date of Encounter: 01/18/2024 [facility] Cardiologist: None   Interval Summary   Breathing is OK  No CP  NO dizziness    Vital Signs Vitals:   01/18/24 0000 01/18/24 0053 01/18/24 0556 01/18/24 0929  BP:  120/74 113/71 115/72  Pulse: 89 (!) 106 (!) 104 98  Resp: 15 14 (!) 22   Temp:   97.9 F (36.6 C)   TempSrc:   Oral   SpO2: 95% 92% 95%   Weight:      Height:        Intake/Output Summary (Last 24 hours) at 01/18/2024 1209 Last data filed at 01/18/2024 0558 Gross per 24 hour  Intake 120 ml  Output 1700 ml  Net -1580 ml      01/13/2024    1:38 AM 11/20/2023    5:51 PM 10/22/2023    5:08 AM  Last 3 Weights  Weight (lbs) 240 lb 240 lb 241 lb 6.5 oz  Weight (kg) 108.863 kg 108.863 kg 109.5 kg      Telemetry/ECG  Atrial flutter with heart rates in the 130s to 60s.  Heart rates were faster overnight and have slowed down during the day- Personally Reviewed  Physical Exam  GEN:Pt in NAD  Neck: No JVD Cardiac: Irregularly irregular rhythm No murmurs  Respiratory: Clear to auscultation bilaterally. GI: Soft, nontender, non-distended  MS: No LE edema  Assessment & Plan   68 y.o. 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 Celinda.    Persistent atrial flutter with RVR Hx of pSVT CHA2DS2-VASc Score = 4 [CHF History: 0, HTN History: 1, Diabetes History: 1, Stroke History: 0, Vascular Disease History: 1 (mild CAD, aortic atherosclerosis), Age Score: 1, Gender Score: 0].  Therefore, the patient's annual risk of stroke is 4.8 %.    Has not been on anticoagulation in the past due to freq falls.  This is a difficult  situation with the patient's CHADS2 Vasc score. The pt has now  failed Multaq    Amiodarone  discussed in past [facility])  Avoided  due to pt's borderline hypothyroidism. Heart rates overall well controlled    Patient expressed her desire to be seen by cardiology at [facility] as he used to work there Has appt for the end of Sept 2025  For the interval , we will plan for follow up in afib clinic    Cardioversion possibly in 4 wks with reconsideration of other meds/procedures   Pt  will need to decide where he wants his care providers ([facility] vs [facility])   For now:. Continue Eliquis  5 mg twice daily Continue metoprolol  75 mg twice daily Cardizem  90 mg every 6 hours. Will switch to 360 daily tomorrow     HLD Continue atorvastatin  10 mg     HTN Remains well controlled on current regimen Continue metoprolol  75 mg twice daily.    OSA on CPAP Patient reported wearing CPAP overnight.     OK to d/c from cardiac standpoint   Will sign off   Will arrange appts    For questions or updates, please contact [facility] Please consult [email or web address] for contact info under       Signed, Vina Gull, MD

## 2024-01-18 NOTE — TOC Transition Note (Signed)
 Transition of Care [facility]) - Discharge Note   Patient Details  Name: Alan Lin MRN: [id number] Date of Birth: August 17, 1955  Transition of Care [facility]) CM/SW Contact:  Bascom Service, RN Phone Number: 01/18/2024, 1:50 PM   Clinical Narrative:   d/c summary sent to [facility] rep [facility] accepted. Going to rm#[id number],report#[phone]. PTAR called. No further CM needs.    Final next level of care: Skilled Nursing Facility Barriers to Discharge: No Barriers Identified   Patient Goals and CMS Choice Patient states their goals for this hospitalization and ongoing recovery are:: Rehab CMS Medicare.gov Compare Post Acute Care list provided to:: Patient Choice offered to / list presented to : Patient [place] ownership interest in [facility].provided to:: Patient    Discharge Placement              Patient chooses bed at: [facility] Patient to be transferred to facility by: PTAR Name of family member notified: patient Patient and family notified of of transfer: 01/18/24  Discharge Plan and Services Additional resources added to the After Visit Summary for     Discharge Planning Services: CM Consult                                 Social Drivers of Health (SDOH) Interventions SDOH Screenings   Food Insecurity: No Food Insecurity (01/14/2024)  Housing: Low Risk  (01/14/2024)  Transportation Needs: No Transportation Needs (01/14/2024)  Utilities: Not At Risk (01/14/2024)  Financial Resource Strain: Low Risk  (08/29/2023)   Received from [facility]  Physical Activity: Insufficiently Active (08/29/2023)   Received from [facility]  Social Connections: Unknown (01/13/2024)  Tobacco Use: Low Risk  (01/13/2024)  Health Literacy: Low Risk  (08/29/2023)   Received from [facility]     Readmission Risk Interventions     No data to display

## 2024-01-18 NOTE — Progress Notes (Signed)
 Progress Note    Alan Lin   FMW:[id number]  DOB: 04-14-1956  DOA: 01/13/2024     3 PCP: Agustina Greig NOVAK, MD  Initial CC: knee pain  Hospital Course: Alan Lin is a 68 y.o. male with PMH PAF, OA, DM II, HLD, HTN, BPH, lumbar degenerative disc disease, cervical DDD, history of falls, OSA who presented with knee pain and right lower extremity pains.  In the ED initial vitals were notable for tachycardia.  Labs showed CBC with WBC of 14.3. Troponin level was normal.  BNP 72.7 pg/mL.  BMP was unremarkable with the exception of glucose of 181 mg/dL. Phosphorus 3.2 and magnesium  1.9 mg/dL. Right knee x-ray showed no acute fracture or dislocation.  Chest x-ray without any infiltrate.   He was admitted for A-fib with RVR and cardiology was consulted.  Persistent atrial flutter with RVR CHA?DS?-VASc Score of at least 4.  Initially on Cardizem  drip.  Previously not on anticoagulation due to his fall risk but decision was made to initiate anticoagulation during hospitalization especially with bridging to SNF (however, he then declined SNF) - Medications adjusted by cardiology.  Transitioned to Lopressor  and Cardizem   Right knee pain.  History of falls. Continue supportive care.   - Continue with PT/OT  Subclinical hypothyroidism.  Normal TSH and normal FT4  Mild hypokalemia.   - Repleted   Anxiety/  Depressive disorder Continue Cymbalta  and trazodone    DDD (degenerative disc disease), cervical and lumbar On gabapentin  and duloxetine .  Will continue   Primary osteoarthritis of both knees Tylenol   Hyperlipidemia Continue Lipitor   Essential hypertension -Continue metoprolol  and Cardizem   OSA (obstructive sleep apnea) CPAP at bedtime.   BPH (benign prostatic hyperplasia) Continue Flomax    Diabetic neuropathy  Continue gabapentin  and duloxetine    Type 2 diabetes mellitus with hyperglycemia  -Continue Jardiance  and semaglutide  at discharge; SSI while inpatient  - Last  A1c 6.7% on 01/18/2024  Interval History:  Was to d/c to [facility] today but changed his mind. I do not think he's safe yet to return back to ILF at [facility]. Still 2 person assist with PT and stood < 10 sec on 7/30 after needing 2 person assist for sit to stand.    Old records reviewed in assessment of this patient  Antimicrobials:   DVT prophylaxis:   apixaban  (ELIQUIS ) tablet 5 mg   Code Status:   Code Status: Full Code  Mobility Assessment (Last 72 Hours)     Mobility Assessment     Row Name 01/18/24 0916 01/17/24 2036 01/17/24 1500 01/17/24 0836 01/16/24 2047   Does the patient have exclusion criteria? No - Perform mobility assessment No - Perform mobility assessment -- No - Perform mobility assessment No - Perform mobility assessment   What is the highest level of mobility based on the mobility assessment? Level 2 (Chairfast) - Balance while sitting on edge of bed and cannot stand Level 2 (Chairfast) - Balance while sitting on edge of bed and cannot stand Level 2 (Chairfast) - Balance while sitting on edge of bed and cannot stand Level 1 (Bedfast) - Unable to balance while sitting on edge of bed Level 1 (Bedfast) - Unable to balance while sitting on edge of bed   Is the above level different from baseline mobility prior to current illness? Yes - Recommend PT order -- -- -- --    Row Name 01/16/24 0903 01/16/24 0750 01/15/24 2111       Does the patient have exclusion criteria?  No - Perform mobility assessment -- No - Continue assessment     What is the highest level of mobility based on the mobility assessment? Level 1 (Bedfast) - Unable to balance while sitting on edge of bed Level 2 (Chairfast) - Balance while sitting on edge of bed and cannot stand Level 1 (Bedfast) - Unable to balance while sitting on edge of bed        Barriers to discharge: none Disposition Plan:  TBD HH orders placed: TBD Status is: Inpt  Objective: Blood pressure 129/84, pulse 98, temperature 97.6  F (36.4 C), temperature source Oral, resp. rate (!) 22, height 5' 10 (1.778 m), weight 108.9 kg, SpO2 95%.  Examination:  Physical Exam Constitutional:      General: He is not in acute distress.    Appearance: Normal appearance.  HENT:     Head: Normocephalic and atraumatic.     Mouth/Throat:     Mouth: Mucous membranes are moist.  Eyes:     Extraocular Movements: Extraocular movements intact.  Cardiovascular:     Rate and Rhythm: Normal rate. Rhythm irregular.  Pulmonary:     Effort: Pulmonary effort is normal. No respiratory distress.     Breath sounds: Normal breath sounds. No wheezing.  Abdominal:     General: Bowel sounds are normal. There is no distension.     Palpations: Abdomen is soft.     Tenderness: There is no abdominal tenderness.  Musculoskeletal:        General: Normal range of motion.     Cervical back: Normal range of motion and neck supple.  Skin:    General: Skin is warm and dry.  Neurological:     General: No focal deficit present.     Mental Status: He is alert.     Comments: Generalized weakness  Psychiatric:        Mood and Affect: Mood normal.        Behavior: Behavior normal.      Consultants:    Procedures:    Data Reviewed: Results for orders placed or performed during the hospital encounter of 01/13/24 (from the past 24 hours)  Glucose, capillary     Status: Abnormal   Collection Time: 01/17/24  4:40 PM  Result Value Ref Range   Glucose-Capillary 169 (H) 70 - 99 mg/dL  Glucose, capillary     Status: Abnormal   Collection Time: 01/17/24  8:14 PM  Result Value Ref Range   Glucose-Capillary 164 (H) 70 - 99 mg/dL  Hemoglobin J8r     Status: Abnormal   Collection Time: 01/18/24  5:22 AM  Result Value Ref Range   Hgb A1c MFr Bld 6.7 (H) 4.8 - 5.6 %   Mean Plasma Glucose 145.59 mg/dL  Basic metabolic panel with GFR     Status: Abnormal   Collection Time: 01/18/24  5:22 AM  Result Value Ref Range   Sodium 136 135 - 145 mmol/L    Potassium 4.1 3.5 - 5.1 mmol/L   Chloride 104 98 - 111 mmol/L   CO2 21 (L) 22 - 32 mmol/L   Glucose, Bld 166 (H) 70 - 99 mg/dL   BUN 20 8 - 23 mg/dL   Creatinine, Ser 9.63 (L) 0.61 - 1.24 mg/dL   Calcium  9.1 8.9 - 10.3 mg/dL   GFR, Estimated >39 >39 mL/min   Anion gap 11 5 - 15  Magnesium      Status: None   Collection Time: 01/18/24  5:22 AM  Result  Value Ref Range   Magnesium  1.9 1.7 - 2.4 mg/dL  Glucose, capillary     Status: Abnormal   Collection Time: 01/18/24  7:32 AM  Result Value Ref Range   Glucose-Capillary 156 (H) 70 - 99 mg/dL  Glucose, capillary     Status: Abnormal   Collection Time: 01/18/24 11:19 AM  Result Value Ref Range   Glucose-Capillary 160 (H) 70 - 99 mg/dL    I have reviewed pertinent nursing notes, vitals, labs, and images as necessary. I have ordered labwork to follow up on as indicated.  I have reviewed the last notes from staff over past 24 hours. I have discussed patient's care plan and test results with nursing staff, CM/SW, and other staff as appropriate.  Time spent: Greater than 50% of the 55 minute visit was spent in counseling/coordination of care for the patient as laid out in the A&P.   LOS: 3 days   Alm Apo, MD [facility] 01/18/2024, 2:53 PM

## 2024-01-18 NOTE — TOC Progression Note (Addendum)
 Transition of Care [facility]) - Progression Note    Patient Details  Name: Alan Lin MRN: [id number] Date of Birth: May 13, 1956  Transition of [facility] CM/SW Contact  Ameilia Rattan, [name], RN Phone Number: 01/18/2024, 3:25 PM  Clinical Narrative:Patient spoke to Dr. ODESSIA agreed to decide on another ST SNF rehab facility-patient has list in rm already informed [facility] declined. He will choose a facility from list. Will nedd to inform Lexine chute medicare of change of facility since auth already received.   -3:30p patient chose [facility] rep Tanya aware bed available tomorrow, already has serbia. MD updated.       Expected Discharge Plan: Skilled Nursing Facility Barriers to Discharge: Continued Medical Work up               Expected Discharge Plan and Services   Discharge Planning Services: CM Consult   Living arrangements for the past 2 months: Independent Living Facility Expected Discharge Date: 01/18/24                                     Social Drivers of Health (SDOH) Interventions SDOH Screenings   Food Insecurity: No Food Insecurity (01/14/2024)  Housing: Low Risk  (01/14/2024)  Transportation Needs: No Transportation Needs (01/14/2024)  Utilities: Not At Risk (01/14/2024)  Financial Resource Strain: Low Risk  (08/29/2023)   Received from [facility]  Physical Activity: Insufficiently Active (08/29/2023)   Received from [facility]  Social Connections: Unknown (01/13/2024)  Tobacco Use: Low Risk  (01/13/2024)  Health Literacy: Low Risk  (08/29/2023)   Received from [facility]    Readmission Risk Interventions     No data to display

## 2024-01-19 DIAGNOSIS — I4891 Unspecified atrial fibrillation: Secondary | ICD-10-CM | POA: Diagnosis not present

## 2024-01-19 LAB — GLUCOSE, CAPILLARY
Glucose-Capillary: 152 mg/dL — ABNORMAL HIGH (ref 70–99)
Glucose-Capillary: 198 mg/dL — ABNORMAL HIGH (ref 70–99)

## 2024-01-19 MED ORDER — METOPROLOL TARTRATE 100 MG PO TABS: 100.0000 mg | ORAL_TABLET | Freq: Two times a day (BID) | ORAL | Status: DC

## 2024-01-19 MED ORDER — METOPROLOL TARTRATE 5 MG/5ML IV SOLN
5.0000 mg | INTRAVENOUS | Status: DC | PRN
  Administered 2024-01-19: 5 mg via INTRAVENOUS
  Filled 2024-01-19: qty 5

## 2024-01-19 MED ORDER — METOPROLOL TARTRATE 50 MG PO TABS: 100.0000 mg | ORAL_TABLET | Freq: Two times a day (BID) | ORAL | Status: DC

## 2024-01-19 MED ORDER — METOPROLOL TARTRATE 5 MG/5ML IV SOLN
5.0000 mg | INTRAVENOUS | Status: AC
  Administered 2024-01-19: 5 mg via INTRAVENOUS
  Filled 2024-01-19: qty 5

## 2024-01-19 MED ORDER — METOPROLOL TARTRATE 25 MG PO TABS
25.0000 mg | ORAL_TABLET | Freq: Once | ORAL | Status: AC
  Administered 2024-01-19: 25 mg via ORAL
  Filled 2024-01-19: qty 1

## 2024-01-19 NOTE — Progress Notes (Signed)
  Progress Note  Patient Name: Alan Lin Date of Encounter: 01/19/2024 [facility] Cardiologist: None   Interval Summary   Asked to be moved up in bed.  No chest pain no shortness of breath  Vital Signs Vitals:   01/18/24 2300 01/19/24 0437 01/19/24 1055 01/19/24 1226  BP: 124/76 133/83 119/79   Pulse: 99 72    Resp:  16    Temp:  98.1 F (36.7 C)  (!) 97.5 F (36.4 C)  TempSrc:  Oral  Oral  SpO2:  94%    Weight:      Height:        Intake/Output Summary (Last 24 hours) at 01/19/2024 1433 Last data filed at 01/19/2024 0800 Gross per 24 hour  Intake --  Output 2050 ml  Net -2050 ml      01/13/2024    1:38 AM 11/20/2023    5:51 PM 10/22/2023    5:08 AM  Last 3 Weights  Weight (lbs) 240 lb 240 lb 241 lb 6.5 oz  Weight (kg) 108.863 kg 108.863 kg 109.5 kg      Telemetry/ECG  Atrial fibrillation heart rate between 80 and 133- Personally Reviewed  Physical Exam  GEN: No acute distress.   Neck: No JVD Cardiac: Irregularly irregular no murmurs, rubs, or gallops.  Respiratory: Clear to auscultation bilaterally. GI: Soft, nontender, non-distended  MS: No edema  Assessment & Plan   A-fib/atrial flutter with rapid ventricular response - CHA2DS2-VASc 4 - Continue Eliquis  5 mg twice a day - I am going to increase his metoprolol  to 100 mg twice a day and give him 25 mg now to help with the mild tachycardia. - Consolidated Cardizem  to 360 mg daily.  Okay to DC to skilled nursing facility.  He may have periods of time where tachycardia may occur however he is not in danger and for the most part shows an average heart rate of less than 100 on telemetry currently.  Excellent.     For questions or updates, please contact [facility] Please consult [email or web address] for contact info under       Signed, Oneil Parchment, MD

## 2024-01-19 NOTE — Progress Notes (Signed)
   01/19/24 0000  BiPAP/CPAP/SIPAP  BiPAP/CPAP/SIPAP Pt Type Adult  BiPAP/CPAP/SIPAP DREAMSTATIOND  Mask Type Full face mask  Dentures removed? Not applicable  Mask Size Medium  EPAP 14 cmH2O  FiO2 (%) 21 %  Patient Home Machine No  Patient Home Mask No  Patient Home Tubing No  Auto Titrate No  Device Plugged into RED Power Outlet Yes

## 2024-01-19 NOTE — TOC CM/SW Note (Signed)
 CSW contacted by Myrene in admissions at [facility] requesting patient's CPAP after patient discharged from the hospital. Per Myrene, patient does not have any local family/friends that are able to bring it to the facility and [facility] cannot transport it to the facility. CSW asked if patient used it during his previous rehab stay in May, but it was not confirmed if it was used or not. Myrene asked about having an Gisele retrieve the CPAP from [facility]. CSW asked Myrene if patient had a card with him to pay for that, but Myrene reported patient did not appear to have a card to complete payment. CSW notified TOC supervisor regarding issue.

## 2024-01-19 NOTE — Care Management Important Message (Signed)
 Important Message  Patient Details IM Letter given. Name: BREION NOVACEK MRN: [id number] Date of Birth: 07-25-1955   Important Message Given:  Yes - Medicare IM     Malichi Palardy 01/19/2024, 9:23 AM

## 2024-01-19 NOTE — TOC Transition Note (Signed)
 Transition of Care [facility]) - Discharge Note  Patient Details  Name: Alan Lin MRN: [id number] Date of Birth: [birth year]  Transition of Care [facility]) CM/SW Contact:  Duwaine GORMAN Aran, LCSW Phone Number: 01/19/2024, 2:49 PM  Clinical Narrative: Patient will discharge today to [facility]. Patient will go to room 214B and the number for report is [phone]. Discharge summary, discharge orders, and SNF transfer report faxed to facility in hub. Medical necessity form done; PTAR scheduled. Discharge packet completed. CSW notified patient of transportation being set up. RN updated. TOC signing off.  Final next level of care: Skilled Nursing Facility Barriers to Discharge: Barriers Resolved  Patient Goals and CMS Choice Patient states their goals for this hospitalization and ongoing recovery are:: Rehab CMS Medicare.gov Compare Post Acute Care list provided to:: Patient Choice offered to / list presented to : Patient [place] ownership interest in [facility].provided to:: Patient   Discharge Placement      Patient chooses bed at: [facility] Patient to be transferred to facility by: PTAR Name of family member notified: patient Patient and family notified of of transfer: 01/19/24  Discharge Plan and Services Additional resources added to the After Visit Summary for   Discharge Planning Services: CM Consult     DME Arranged: N/A DME Agency: NA  Social Drivers of Health (SDOH) Interventions SDOH Screenings   Food Insecurity: No Food Insecurity (01/14/2024)  Housing: Low Risk  (01/14/2024)  Transportation Needs: No Transportation Needs (01/14/2024)  Utilities: Not At Risk (01/14/2024)  Financial Resource Strain: Low Risk  (08/29/2023)   Received from [facility]  Physical Activity: Insufficiently Active (08/29/2023)   Received from [facility]  Social Connections: Unknown (01/13/2024)  Tobacco Use: Low Risk  (01/13/2024)  Health Literacy: Low Risk   (08/29/2023)   Received from [facility]   Readmission Risk Interventions     No data to display

## 2024-01-24 ENCOUNTER — Encounter (HOSPITAL_COMMUNITY): Payer: Self-pay

## 2024-01-24 ENCOUNTER — Ambulatory Visit (HOSPITAL_COMMUNITY): Attending: Internal Medicine | Admitting: Internal Medicine

## 2024-01-24 NOTE — Progress Notes (Incomplete)
 Primary Care Physician: Agustina Greig NOVAK, MD Primary Cardiologist: None Electrophysiologist: None  {Click to update primary MD,subspecialty MD or APP then REFRESH:1}   Referring Physician: Patsy Lenis, MD  {Removed [facility], PMH, PSH, ALLERGY, CMED, and SOC :1}   Alan Lin is a 68 y.o. male with a history of OA, T2DM, HTN, HLD, BPH, history of falls, and atrial fibrillation who presents for consultation in the [facility]. Hospital admission 7/26-01/19/2024 for knee pain and right lower extremity pains found to be in Afib with RVR. Started on OAC previously not anticoagulated due to fall risk. Discharged on metoprolol  100 mg BID and diltiazem  360 mg daily. Discharged in Afib. Patient is on Eliquis  for a CHADS2VASC score of 4.  On evaluation today, patient is currently in ***.  Today, he denies symptoms of ***palpitations, chest pain, shortness of breath, orthopnea, PND, lower extremity edema, dizziness, presyncope, syncope, snoring, daytime somnolence, bleeding, or neurologic sequela. The patient is tolerating medications without difficulties and is otherwise without complaint today.    Atrial Fibrillation Risk Factors:  he {Action; does/does not:19097} have symptoms or diagnosis of sleep apnea. he {ACTION; IS/IS [id number]} compliant with CPAP therapy. he {Action; does/does not:19097} have a history of rheumatic fever. he {Action; does/does not:19097} have a history of alcohol use. The patient {Action; does/does not:19097} have a history of early familial atrial fibrillation or other arrhythmias.  he has a BMI of There is no height or weight on file to calculate BMI.. There were no vitals filed for this visit.  Current Outpatient Medications  Medication Sig Dispense Refill   acetaminophen  (TYLENOL ) 325 MG tablet Take 1 tablet (325 mg total) by mouth every 6 (six) hours as needed for mild pain (or Fever >/= 101). (Patient not taking: Reported on  12/13/2023)     apixaban  (ELIQUIS ) 5 MG TABS tablet Take 1 tablet (5 mg total) by mouth 2 (two) times daily.     atorvastatin  (LIPITOR) 10 MG tablet Take 1 tablet by mouth daily.     BIOTIN PO Take 1 tablet by mouth daily.     cyclobenzaprine  (FLEXERIL ) 10 MG tablet Take 10 mg by mouth at bedtime.     diltiazem  (CARDIZEM  CD) 360 MG 24 hr capsule Take 1 capsule (360 mg total) by mouth daily.     Docusate Sodium  (DSS) 100 MG CAPS Take 1 capsule by mouth daily.     DULoxetine  (CYMBALTA ) 60 MG capsule Take 1 capsule by mouth daily.     gabapentin  (NEURONTIN ) 300 MG capsule Take 300-1,500 mg by mouth daily. Take 300mg  (1 capsule) by mouth in the mornings and 1500mg  (5 capsules) in the evening.     GAVILAX 17 GM/SCOOP powder Take 17 g by mouth daily as needed for mild constipation or moderate constipation.     JARDIANCE  10 MG TABS tablet Take 10 mg by mouth daily.     magnesium  30 MG tablet Take 30 mg by mouth in the morning.     methocarbamol  (ROBAXIN ) 500 MG tablet Take 1 tablet (500 mg total) by mouth every 8 (eight) hours as needed for muscle spasms.     metoprolol  tartrate (LOPRESSOR ) 100 MG tablet Take 1 tablet (100 mg total) by mouth 2 (two) times daily.     Multiple Vitamins-Minerals (MULTIVITAMIN WITH MINERALS) tablet Take 1 tablet by mouth daily.     Semaglutide  (RYBELSUS ) 7 MG TABS Take 1 tablet by mouth daily.     senna (SENOKOT) 8.6 MG tablet  Take 1 tablet by mouth daily.     tamsulosin  (FLOMAX ) 0.4 MG CAPS capsule Take 0.4 mg by mouth daily.     traZODone  (DESYREL ) 50 MG tablet Take 50 mg by mouth at bedtime.     No current facility-administered medications for this visit.    Atrial Fibrillation Management history:  Previous antiarrhythmic drugs: none Previous cardioversions: none Previous ablations: none Anticoagulation history: Eliquis    ROS- All systems are reviewed and negative except as per the HPI above.  Physical Exam: There were no vitals taken for this visit.  GEN:  Well nourished, well developed in no acute distress NECK: No JVD; No carotid bruits CARDIAC: {EPRHYTHM:[id number]}, no murmurs, rubs, gallops RESPIRATORY:  Clear to auscultation without rales, wheezing or rhonchi  ABDOMEN: Soft, non-tender, non-distended EXTREMITIES:  No edema; No deformity   EKG today demonstrates ***  Echo 01/13/24 demonstrated  1. Left ventricular ejection fraction, by estimation, is 70 to 75%. Left  ventricular ejection fraction by 2D MOD biplane is 72.6 %. The left  ventricle has hyperdynamic function. The left ventricle has no regional  wall motion abnormalities. There is mild   left ventricular hypertrophy. Left ventricular diastolic parameters are  indeterminate.   2. Right ventricular systolic function is normal. The right ventricular  size is mildly enlarged. Tricuspid regurgitation signal is inadequate for  assessing PA pressure.   3. Moderate pericardial effusion, more adjacent to the RV - the IVC does  not collapse, but there is no RA or RV collapse or exaggerated inflow  variation to suggest tamponade - clinical correlation is advised,  especially given the elevated HR. Moderate  pericardial effusion. The pericardial effusion is circumferential. There  is no evidence of cardiac tamponade.   4. The mitral valve is grossly normal. Trivial mitral valve  regurgitation. No evidence of mitral stenosis. Moderate mitral annular  calcification.   5. The aortic valve is tricuspid. Aortic valve regurgitation is not  visualized. Aortic valve sclerosis is present, with no evidence of aortic  valve stenosis.   6. The inferior vena cava is dilated in size with <50% respiratory  variability, suggesting right atrial pressure of 15 mmHg.   ASSESSMENT & PLAN CHA2DS2-VASc Score = 4  The patient's score is based upon: CHF History: 0 HTN History: 1 Diabetes History: 1 Stroke History: 0 Vascular Disease History: 1 (mild CAD, aortic atherosclerosis) Age Score: 1 Gender  Score: 0   {Confirm score is correct.  If not, click here to update score.  REFRESH note.  :1}    ASSESSMENT AND PLAN: {Select the correct AFib Diagnosis                 :[id number]}  Persistent Afib  DCCV?  Follow up ***   Terra Pac, PA-C  [facility] [address] [place], [place] [address] [phone]

## 2024-02-01 ENCOUNTER — Emergency Department (HOSPITAL_COMMUNITY)

## 2024-02-01 ENCOUNTER — Other Ambulatory Visit: Payer: Self-pay

## 2024-02-01 ENCOUNTER — Inpatient Hospital Stay (HOSPITAL_COMMUNITY)
Admission: EM | Admit: 2024-02-01 | Discharge: 2024-02-06 | DRG: 309 | Disposition: A | Discharge status: Home Health | Attending: Internal Medicine | Admitting: Internal Medicine

## 2024-02-01 DIAGNOSIS — F418 Other specified anxiety disorders: Secondary | ICD-10-CM | POA: Diagnosis not present

## 2024-02-01 DIAGNOSIS — Z7984 Long term (current) use of oral hypoglycemic drugs: Secondary | ICD-10-CM

## 2024-02-01 DIAGNOSIS — I10 Essential (primary) hypertension: Secondary | ICD-10-CM | POA: Diagnosis present

## 2024-02-01 DIAGNOSIS — R296 Repeated falls: Secondary | ICD-10-CM | POA: Diagnosis present

## 2024-02-01 DIAGNOSIS — G4733 Obstructive sleep apnea (adult) (pediatric): Secondary | ICD-10-CM | POA: Diagnosis present

## 2024-02-01 DIAGNOSIS — I4892 Unspecified atrial flutter: Principal | ICD-10-CM | POA: Diagnosis present

## 2024-02-01 DIAGNOSIS — Z7901 Long term (current) use of anticoagulants: Secondary | ICD-10-CM

## 2024-02-01 DIAGNOSIS — F32A Depression, unspecified: Secondary | ICD-10-CM | POA: Diagnosis present

## 2024-02-01 DIAGNOSIS — Z79899 Other long term (current) drug therapy: Secondary | ICD-10-CM

## 2024-02-01 DIAGNOSIS — Z885 Allergy status to narcotic agent status: Secondary | ICD-10-CM

## 2024-02-01 DIAGNOSIS — E66811 Obesity, class 1: Secondary | ICD-10-CM | POA: Diagnosis present

## 2024-02-01 DIAGNOSIS — I358 Other nonrheumatic aortic valve disorders: Secondary | ICD-10-CM | POA: Diagnosis present

## 2024-02-01 DIAGNOSIS — N400 Enlarged prostate without lower urinary tract symptoms: Secondary | ICD-10-CM | POA: Diagnosis present

## 2024-02-01 DIAGNOSIS — F0394 Unspecified dementia, unspecified severity, with anxiety: Secondary | ICD-10-CM | POA: Diagnosis present

## 2024-02-01 DIAGNOSIS — Z888 Allergy status to other drugs, medicaments and biological substances status: Secondary | ICD-10-CM | POA: Diagnosis not present

## 2024-02-01 DIAGNOSIS — E785 Hyperlipidemia, unspecified: Secondary | ICD-10-CM | POA: Diagnosis present

## 2024-02-01 DIAGNOSIS — Z91148 Patient's other noncompliance with medication regimen for other reason: Secondary | ICD-10-CM

## 2024-02-01 DIAGNOSIS — I4891 Unspecified atrial fibrillation: Secondary | ICD-10-CM | POA: Diagnosis not present

## 2024-02-01 DIAGNOSIS — M5030 Other cervical disc degeneration, unspecified cervical region: Secondary | ICD-10-CM | POA: Diagnosis present

## 2024-02-01 DIAGNOSIS — D6869 Other thrombophilia: Secondary | ICD-10-CM | POA: Diagnosis present

## 2024-02-01 DIAGNOSIS — E7800 Pure hypercholesterolemia, unspecified: Secondary | ICD-10-CM | POA: Diagnosis not present

## 2024-02-01 DIAGNOSIS — I480 Paroxysmal atrial fibrillation: Secondary | ICD-10-CM | POA: Diagnosis present

## 2024-02-01 DIAGNOSIS — Z1152 Encounter for screening for COVID-19: Secondary | ICD-10-CM | POA: Diagnosis not present

## 2024-02-01 DIAGNOSIS — Z7401 Bed confinement status: Secondary | ICD-10-CM

## 2024-02-01 DIAGNOSIS — E119 Type 2 diabetes mellitus without complications: Secondary | ICD-10-CM | POA: Diagnosis not present

## 2024-02-01 DIAGNOSIS — I2510 Atherosclerotic heart disease of native coronary artery without angina pectoris: Secondary | ICD-10-CM | POA: Diagnosis present

## 2024-02-01 DIAGNOSIS — Z6834 Body mass index (BMI) 34.0-34.9, adult: Secondary | ICD-10-CM

## 2024-02-01 DIAGNOSIS — E1142 Type 2 diabetes mellitus with diabetic polyneuropathy: Secondary | ICD-10-CM | POA: Diagnosis present

## 2024-02-01 DIAGNOSIS — F0393 Unspecified dementia, unspecified severity, with mood disturbance: Secondary | ICD-10-CM | POA: Diagnosis present

## 2024-02-01 DIAGNOSIS — E1140 Type 2 diabetes mellitus with diabetic neuropathy, unspecified: Secondary | ICD-10-CM | POA: Diagnosis present

## 2024-02-01 DIAGNOSIS — F419 Anxiety disorder, unspecified: Secondary | ICD-10-CM | POA: Diagnosis present

## 2024-02-01 DIAGNOSIS — I2583 Coronary atherosclerosis due to lipid rich plaque: Secondary | ICD-10-CM | POA: Diagnosis not present

## 2024-02-01 DIAGNOSIS — Z820 Family history of epilepsy and other diseases of the nervous system: Secondary | ICD-10-CM

## 2024-02-01 DIAGNOSIS — K5909 Other constipation: Secondary | ICD-10-CM | POA: Diagnosis present

## 2024-02-01 LAB — BASIC METABOLIC PANEL WITH GFR
Anion gap: 11 (ref 5–15)
BUN: 9 mg/dL (ref 8–23)
CO2: 22 mmol/L (ref 22–32)
Calcium: 9 mg/dL (ref 8.9–10.3)
Chloride: 105 mmol/L (ref 98–111)
Creatinine, Ser: 0.57 mg/dL — ABNORMAL LOW (ref 0.61–1.24)
GFR, Estimated: >60 mL/min (ref >60)
Glucose, Bld: 125 mg/dL — ABNORMAL HIGH (ref 70–99)
Potassium: 3.6 mmol/L (ref 3.5–5.1)
Sodium: 138 mmol/L (ref 135–145)

## 2024-02-01 LAB — CBC WITH DIFFERENTIAL/PLATELET
Abs Immature Granulocytes: 0.05 K/uL (ref 0.00–0.07)
Basophils Absolute: 0.1 K/uL (ref 0.0–0.1)
Basophils Relative: 1 %
Eosinophils Absolute: 0.1 K/uL (ref 0.0–0.5)
Eosinophils Relative: 1 %
HCT: 47.6 % (ref 39.0–52.0)
Hemoglobin: 14.1 g/dL (ref 13.0–17.0)
Immature Granulocytes: 1 %
Lymphocytes Relative: 18 %
Lymphs Abs: 1.6 K/uL (ref 0.7–4.0)
MCH: 25.2 pg — ABNORMAL LOW (ref 26.0–34.0)
MCHC: 29.6 g/dL — ABNORMAL LOW (ref 30.0–36.0)
MCV: 85 fL (ref 80.0–100.0)
Monocytes Absolute: 0.9 K/uL (ref 0.1–1.0)
Monocytes Relative: 10 %
Neutro Abs: 6.3 K/uL (ref 1.7–7.7)
Neutrophils Relative %: 69 %
Platelets: 288 K/uL (ref 150–400)
RBC: 5.6 MIL/uL (ref 4.22–5.81)
RDW: 14.5 % (ref 11.5–15.5)
WBC: 9.1 K/uL (ref 4.0–10.5)
nRBC: 0 % (ref 0.0–0.2)

## 2024-02-01 LAB — RESP PANEL BY RT-PCR (RSV, FLU A&B, COVID)  RVPGX2
Influenza A by PCR: NEGATIVE
Influenza B by PCR: NEGATIVE
Resp Syncytial Virus by PCR: NEGATIVE
SARS Coronavirus 2 by RT PCR: NEGATIVE

## 2024-02-01 LAB — I-STAT CG4 LACTIC ACID, ED: Lactic Acid, Venous: 1.1 mmol/L (ref 0.5–1.9)

## 2024-02-01 LAB — TROPONIN I (HIGH SENSITIVITY)
Troponin I (High Sensitivity): 5 ng/L (ref <18)
Troponin I (High Sensitivity): 5 ng/L (ref <18)

## 2024-02-01 LAB — BRAIN NATRIURETIC PEPTIDE: B Natriuretic Peptide: 31.9 pg/mL (ref 0.0–100.0)

## 2024-02-01 MED ORDER — METOPROLOL TARTRATE 5 MG/5ML IV SOLN
5.0000 mg | Freq: Once | INTRAVENOUS | Status: AC
  Administered 2024-02-01: 5 mg via INTRAVENOUS
  Filled 2024-02-01: qty 5

## 2024-02-01 MED ORDER — ATORVASTATIN CALCIUM 10 MG PO TABS
10.0000 mg | ORAL_TABLET | Freq: Every day | ORAL | Status: DC
  Administered 2024-02-02 – 2024-02-06 (×5): 10 mg via ORAL
  Filled 2024-02-01 (×5): qty 1

## 2024-02-01 MED ORDER — GABAPENTIN 300 MG PO CAPS
300.0000 mg | ORAL_CAPSULE | Freq: Every day | ORAL | Status: DC
  Administered 2024-02-02 – 2024-02-06 (×5): 300 mg via ORAL
  Filled 2024-02-01 (×5): qty 1

## 2024-02-01 MED ORDER — DULOXETINE HCL 60 MG PO CPEP
60.0000 mg | ORAL_CAPSULE | Freq: Every day | ORAL | Status: DC
  Administered 2024-02-02 – 2024-02-06 (×5): 60 mg via ORAL
  Filled 2024-02-01 (×3): qty 1
  Filled 2024-02-01: qty 2
  Filled 2024-02-01: qty 1

## 2024-02-01 MED ORDER — DOCUSATE SODIUM 100 MG PO CAPS
100.0000 mg | ORAL_CAPSULE | Freq: Every day | ORAL | Status: DC
  Administered 2024-02-02 – 2024-02-05 (×2): 100 mg via ORAL
  Filled 2024-02-01 (×3): qty 1

## 2024-02-01 MED ORDER — IOHEXOL 300 MG/ML  SOLN: 80.0000 mL | Freq: Once | INTRAMUSCULAR | Status: DC | PRN

## 2024-02-01 MED ORDER — DILTIAZEM HCL-DEXTROSE 125-5 MG/125ML-% IV SOLN (PREMIX)
5.0000 mg/h | INTRAVENOUS | Status: DC
  Administered 2024-02-01: 5 mg/h via INTRAVENOUS
  Administered 2024-02-01: 15 mg/h via INTRAVENOUS
  Administered 2024-02-01: 10 mg/h via INTRAVENOUS
  Administered 2024-02-02: 5 mg/h via INTRAVENOUS
  Administered 2024-02-02 – 2024-02-03 (×4): 10 mg/h via INTRAVENOUS
  Administered 2024-02-04 – 2024-02-05 (×2): 7.5 mg/h via INTRAVENOUS
  Filled 2024-02-01 (×7): qty 125

## 2024-02-01 MED ORDER — SODIUM CHLORIDE 0.9 % IV BOLUS
500.0000 mL | Freq: Once | INTRAVENOUS | Status: AC
  Administered 2024-02-01: 500 mL via INTRAVENOUS

## 2024-02-01 MED ORDER — SENNA 8.6 MG PO TABS
1.0000 | ORAL_TABLET | Freq: Every day | ORAL | Status: DC
  Administered 2024-02-02 – 2024-02-05 (×2): 8.6 mg via ORAL
  Filled 2024-02-01 (×4): qty 1

## 2024-02-01 MED ORDER — IOHEXOL 350 MG/ML SOLN
75.0000 mL | Freq: Once | INTRAVENOUS | Status: AC | PRN
  Administered 2024-02-01: 75 mL via INTRAVENOUS

## 2024-02-01 MED ORDER — TRAZODONE HCL 50 MG PO TABS
50.0000 mg | ORAL_TABLET | Freq: Every day | ORAL | Status: DC
  Administered 2024-02-01 – 2024-02-05 (×5): 50 mg via ORAL
  Filled 2024-02-01 (×5): qty 1

## 2024-02-01 MED ORDER — INSULIN ASPART 100 UNIT/ML IJ SOLN
0.0000 [IU] | INTRAMUSCULAR | Status: DC
  Administered 2024-02-02: 3 [IU] via SUBCUTANEOUS
  Administered 2024-02-02: 1 [IU] via SUBCUTANEOUS
  Administered 2024-02-02: 2 [IU] via SUBCUTANEOUS
  Administered 2024-02-02: 3 [IU] via SUBCUTANEOUS
  Administered 2024-02-02 – 2024-02-03 (×2): 2 [IU] via SUBCUTANEOUS
  Administered 2024-02-03: 1 [IU] via SUBCUTANEOUS
  Administered 2024-02-03 (×2): 2 [IU] via SUBCUTANEOUS
  Filled 2024-02-01: qty 0.09

## 2024-02-01 MED ORDER — TAMSULOSIN HCL 0.4 MG PO CAPS
0.4000 mg | ORAL_CAPSULE | Freq: Every day | ORAL | Status: DC
  Administered 2024-02-02 – 2024-02-06 (×5): 0.4 mg via ORAL
  Filled 2024-02-01 (×5): qty 1

## 2024-02-01 MED ORDER — HEPARIN BOLUS VIA INFUSION
4000.0000 [IU] | Freq: Once | INTRAVENOUS | Status: AC
  Administered 2024-02-02: 4000 [IU] via INTRAVENOUS
  Filled 2024-02-01: qty 4000

## 2024-02-01 MED ORDER — GABAPENTIN 400 MG PO CAPS
1500.0000 mg | ORAL_CAPSULE | Freq: Every day | ORAL | Status: DC
  Administered 2024-02-01 – 2024-02-05 (×5): 1500 mg via ORAL
  Filled 2024-02-01 (×5): qty 1

## 2024-02-01 MED ORDER — HEPARIN (PORCINE) 25000 UT/250ML-% IV SOLN
1200.0000 [IU]/h | INTRAVENOUS | Status: DC
  Administered 2024-02-01: 1200 [IU]/h via INTRAVENOUS
  Filled 2024-02-01: qty 250

## 2024-02-01 MED ORDER — DILTIAZEM HCL 25 MG/5ML IV SOLN
10.0000 mg | Freq: Once | INTRAVENOUS | Status: AC
  Administered 2024-02-01: 10 mg via INTRAVENOUS
  Filled 2024-02-01: qty 5

## 2024-02-01 MED ORDER — METOPROLOL TARTRATE 50 MG PO TABS
100.0000 mg | ORAL_TABLET | Freq: Two times a day (BID) | ORAL | Status: DC
  Administered 2024-02-01 – 2024-02-06 (×10): 100 mg via ORAL
  Filled 2024-02-01: qty 2
  Filled 2024-02-01: qty 4
  Filled 2024-02-01 (×2): qty 2
  Filled 2024-02-01: qty 4
  Filled 2024-02-01 (×5): qty 2

## 2024-02-01 MED ORDER — GABAPENTIN 300 MG PO CAPS: 300.0000 mg | ORAL_CAPSULE | Freq: Every day | ORAL | Status: DC

## 2024-02-01 NOTE — ED Notes (Signed)
 Consulted with pharmacy due to fact patient was supposed to be on Eliquis  and hadn't been started.  Based on that information obtained during the conversation between pharmacy and the physician, no PT or PTT will be required prior to the start of heparin .  Charge RN is aware.

## 2024-02-01 NOTE — H&P (Signed)
 History and Physical    Alan Lin FMW:[id number] DOB: 03-04-1956 DOA: 02/01/2024  PCP: Agustina Greig NOVAK, MD  Chief Complaint: Tachycardia  HPI: Alan Lin is a 68 y.o. male with medical history significant of A-fib/flutter, osteoarthritis of both knees, type 2 diabetes with peripheral neuropathy, hypertension, hyperlipidemia, BPH, cervical and lumbar lumbar degenerative disc disease, history of falls, OSA on CPAP, anxiety/depression.  Recent hospital admission 7/26-8/1 for persistent atrial flutter with RVR.  He was previously not on anticoagulation due to his fall risk but decision was made to initiate anticoagulation during this hospitalization.  Cardiology was consulted during this hospitalization and he was discharged on Eliquis , Cardizem  360 mg daily, and metoprolol  100 mg twice daily.   Patient presents to the ED today from [facility] for evaluation of tachycardia/heart palpitations since after he did physical therapy this morning.  He was tachycardic to 150 and was given 700 mL IV fluids by EMS.  Heart rate in the 140s on arrival to the ED and EKG concerning for atrial flutter with RVR.  Not hypotensive or hypoxic.  Afebrile.  Labs showing no leukocytosis, potassium within normal range, creatinine 0.57, troponin negative x 2, BNP normal, COVID/influenza/RSV PCR negative, lactic acid normal, UA pending.  CT angiogram chest negative for PE.  Showing trace bilateral pleural effusions with bibasilar atelectasis and no evidence of pneumonia or pulmonary edema.  Patient was given IV Cardizem  10 mg, IV metoprolol  5 mg x 2, and 500 mL normal saline.  Tachycardia did not improve so EDP spoke to on-call cardiologist and patient was started on Cardizem  drip.  TRH called to admit.  Patient states he has been getting physical therapy at his nursing facility and has had some lightheadedness and generalized weakness.  However, he denies any palpitations.  He has no other complaints.  Denies cough,  shortness of breath, chest pain, nausea, vomiting, or abdominal pain.  Review of Systems:  Review of Systems  All other systems reviewed and are negative.   Past Medical History:  Diagnosis Date   Arthritis    Diabetes mellitus without complication (HCC)    Hypertension    Mild CAD    Paroxysmal atrial fibrillation (HCC)    PSVT (paroxysmal supraventricular tachycardia) (HCC)    Sleep apnea     No past surgical history on file.   reports that he has never smoked. He has never used smokeless tobacco. He reports that he does not drink alcohol and does not use drugs.  Allergies  Allergen Reactions   Metformin Diarrhea and Nausea And Vomiting   Oxycodone Other (See Comments)    Constipation   Oxycodone-Acetaminophen      Constipation   Lisinopril Cough   Morphine Itching    Other reaction(s): Other (See Comments)  Constipation   Morphine And Codeine Itching    Family History  Problem Relation Age of Onset   Dementia Mother    Alzheimer's disease Mother    Kidney Stones Father     Prior to Admission medications   Medication Sig Start Date End Date Taking? Authorizing Provider  acetaminophen  (TYLENOL ) 325 MG tablet Take 1 tablet (325 mg total) by mouth every 6 (six) hours as needed for mild pain (or Fever >/= 101). Patient not taking: Reported on 12/13/2023 07/28/22   Elgergawy, Brayton RAMAN, MD  apixaban  (ELIQUIS ) 5 MG TABS tablet Take 1 tablet (5 mg total) by mouth 2 (two) times daily. 01/18/24   Patsy Alm, MD  atorvastatin  (LIPITOR) 10 MG tablet Take  1 tablet by mouth daily. 10/12/23 10/06/24  [provider]  BIOTIN PO Take 1 tablet by mouth daily.    [provider]  cyclobenzaprine  (FLEXERIL ) 10 MG tablet Take 10 mg by mouth at bedtime.    [provider]  diltiazem  (CARDIZEM  CD) 360 MG 24 hr capsule Take 1 capsule (360 mg total) by mouth daily. 01/19/24   Patsy Lenis, MD  Docusate Sodium  (DSS) 100 MG CAPS Take 1 capsule by mouth daily.  10/12/23 10/11/24  [provider]  DULoxetine  (CYMBALTA ) 60 MG capsule Take 1 capsule by mouth daily. 10/12/23   [provider]  gabapentin  (NEURONTIN ) 300 MG capsule Take 300-1,500 mg by mouth daily. Take 300mg  (1 capsule) by mouth in the mornings and 1500mg  (5 capsules) in the evening. 08/03/16   [provider]  GAVILAX 17 GM/SCOOP powder Take 17 g by mouth daily as needed for mild constipation or moderate constipation. 10/01/23   [provider]  JARDIANCE  10 MG TABS tablet Take 10 mg by mouth daily. 08/09/22   [provider]  magnesium  30 MG tablet Take 30 mg by mouth in the morning.    [provider]  methocarbamol  (ROBAXIN ) 500 MG tablet Take 1 tablet (500 mg total) by mouth every 8 (eight) hours as needed for muscle spasms. 01/18/24 01/07/25  Patsy Lenis, MD  metoprolol  tartrate (LOPRESSOR ) 100 MG tablet Take 1 tablet (100 mg total) by mouth 2 (two) times daily. 01/19/24   Patsy Lenis, MD  Multiple Vitamins-Minerals (MULTIVITAMIN WITH MINERALS) tablet Take 1 tablet by mouth daily.    [provider]  Semaglutide  (RYBELSUS ) 7 MG TABS Take 1 tablet by mouth daily.    [provider]  senna (SENOKOT) 8.6 MG tablet Take 1 tablet by mouth daily. 10/12/23 10/11/24  [provider]  tamsulosin  (FLOMAX ) 0.4 MG CAPS capsule Take 0.4 mg by mouth daily. 09/28/22   [provider]  traZODone  (DESYREL ) 50 MG tablet Take 50 mg by mouth at bedtime. 10/12/23 08/28/24  [provider]    Physical Exam: Vitals:   02/01/24 1830 02/01/24 1903 02/01/24 1915 02/01/24 1945  BP: (!) 141/90  132/86 (!) 129/92  Pulse: (!) 141  (!) 139 (!) 142  Resp: (!) 21  13 13   Temp:  99.1 F (37.3 C)    TempSrc:  Oral    SpO2: 100%  96% 96%    Physical Exam Vitals reviewed.  Constitutional:      General: He is not in acute distress. HENT:     Head: Normocephalic and atraumatic.  Eyes:     Extraocular Movements:  Extraocular movements intact.  Cardiovascular:     Rate and Rhythm: Regular rhythm. Tachycardia present.     Pulses: Normal pulses.  Pulmonary:     Effort: Pulmonary effort is normal. No respiratory distress.     Breath sounds: No wheezing, rhonchi or rales.  Abdominal:     General: Bowel sounds are normal.     Palpations: Abdomen is soft.     Tenderness: There is no abdominal tenderness. There is no guarding.  Musculoskeletal:     Cervical back: Normal range of motion.     Right lower leg: No edema.     Left lower leg: No edema.  Skin:    General: Skin is warm and dry.  Neurological:     General: No focal deficit present.     Mental Status: He is alert and oriented to person, place, and time.  Labs on Admission: I have personally reviewed following labs and imaging studies  CBC: Recent Labs  Lab 02/01/24 1535  WBC 9.1  NEUTROABS 6.3  HGB 14.1  HCT 47.6  MCV 85.0  PLT 288   Basic Metabolic Panel: Recent Labs  Lab 02/01/24 1535  NA 138  K 3.6  CL 105  CO2 22  GLUCOSE 125*  BUN 9  CREATININE 0.57*  CALCIUM  9.0   GFR: CrCl cannot be calculated (Unknown ideal weight.). Liver Function Tests: No results for input(s): AST, ALT, ALKPHOS, BILITOT, PROT, ALBUMIN in the last 168 hours. No results for input(s): LIPASE, AMYLASE in the last 168 hours. No results for input(s): AMMONIA in the last 168 hours. Coagulation Profile: No results for input(s): INR, PROTIME in the last 168 hours. Cardiac Enzymes: No results for input(s): CKTOTAL, CKMB, CKMBINDEX, TROPONINI in the last 168 hours. BNP (last 3 results) No results for input(s): PROBNP in the last 8760 hours. HbA1C: No results for input(s): HGBA1C in the last 72 hours. CBG: No results for input(s): GLUCAP in the last 168 hours. Lipid Profile: No results for input(s): CHOL, HDL, LDLCALC, TRIG, CHOLHDL, LDLDIRECT in the last 72 hours. Thyroid  Function Tests: No  results for input(s): TSH, T4TOTAL, FREET4, T3FREE, THYROIDAB in the last 72 hours. Anemia Panel: No results for input(s): VITAMINB12, FOLATE, FERRITIN, TIBC, IRON, RETICCTPCT in the last 72 hours. Urine analysis:    Component Value Date/Time   COLORURINE YELLOW 01/15/2024 0700   APPEARANCEUR HAZY (A) 01/15/2024 0700   LABSPEC 1.029 01/15/2024 0700   PHURINE 5.0 01/15/2024 0700   GLUCOSEU >=500 (A) 01/15/2024 0700   HGBUR NEGATIVE 01/15/2024 0700   BILIRUBINUR NEGATIVE 01/15/2024 0700   KETONESUR NEGATIVE 01/15/2024 0700   PROTEINUR NEGATIVE 01/15/2024 0700   NITRITE NEGATIVE 01/15/2024 0700   LEUKOCYTESUR NEGATIVE 01/15/2024 0700    Radiological Exams on Admission: CT Angio Chest Pulmonary Embolism (PE) W or WO Contrast Result Date: 02/01/2024 CLINICAL DATA:  tachycardia, sob EXAM: CT ANGIOGRAPHY CHEST WITH CONTRAST TECHNIQUE: Multidetector CT imaging of the chest was performed using the standard protocol during bolus administration of intravenous contrast. Multiplanar CT image reconstructions and MIPs were obtained to evaluate the vascular anatomy. RADIATION DOSE REDUCTION: This exam was performed according to the departmental dose-optimization program which includes automated exposure control, adjustment of the mA and/or kV according to patient size and/or use of iterative reconstruction technique. CONTRAST:  75mL OMNIPAQUE  IOHEXOL  350 MG/ML SOLN COMPARISON:  February 01, 2024, January 13, 2024 FINDINGS: Pulmonary Embolism: No pulmonary embolism. Cardiovascular: Borderline cardiomegaly. Unchanged small pericardial effusion.Dense multi-vessel coronary atherosclerosis.No aortic aneurysm. Diffuse aortic atherosclerosis. Mediastinum/Nodes: No mediastinal mass. No mediastinal, hilar, or axillary lymphadenopathy. Lungs/Pleura: The midline trachea and bronchi are patent. Trace bilateral pleural effusions. Posterior bibasilar dependent atelectasis. Elevation of the right  hemidiaphragm. No focal airspace consolidation or pneumothorax. Musculoskeletal: No acute fracture or destructive bone lesion. Multiple, healed remote left-sided rib fractures. Multilevel degenerative disc disease of the spine. Thoracic DISH. Osteopenia. Small volume symmetric bilateral gynecomastia. Upper Abdomen: No acute abnormality in the partially visualized upper abdomen. Small nonobstructive bilateral nephrolithiasis. Review of the MIP images confirms the above findings. IMPRESSION: 1. Trace bilateral pleural effusions with bibasilar atelectasis. No pneumonia or pulmonary edema. 2. No pulmonary embolism. 3. Nonobstructive small bilateral nephrolithiasis. No visualized nephrolithiasis. Aortic Atherosclerosis (ICD10-I70.0). Electronically Signed   By: Rogelia Myers M.D.   On: 02/01/2024 20:08   DG Chest 1 View Result Date: 02/01/2024 CLINICAL DATA:  Shortness of breath.  Tachycardia. EXAM:  CHEST  1 VIEW COMPARISON:  01/13/2024 FINDINGS: The left lung base and inferior heart are not included on today's image. The included lungs are clear and the cardiac silhouette remains borderline enlarged. Aortic arch calcifications. Mild left glenohumeral degenerative changes. Cervical spine fixation hardware. IMPRESSION: No acute abnormality. The left lung base and inferior heart are not included on today's image. Electronically Signed   By: Elspeth Bathe M.D.   On: 02/01/2024 16:47    Assessment and Plan  Atrial flutter with RVR CTA chest negative for PE.  He was given IV Cardizem  10 mg, IV metoprolol  5 mg x 2, and IV fluids with no improvement of his heart rate.  Patient was started on Cardizem  drip in the ED.  He is now maxed out on Cardizem  drip and rate remains in the 140s.  Blood pressure stable.  Per pharmacy med rec, patient was receiving dronedarone  but was never started on Eliquis  and oral Cardizem  at his nursing facility for unknown reason.  Hold dronedarone  as he was not anticoagulated.  Heparin  drip  started and continue Cardizem  drip and home p.o. metoprolol .  Potassium is within normal range, check magnesium  level.  Discussed case with on-call cardiologist Dr. Vonda and he recommends keeping the patient n.p.o. after midnight for TEE and cardioversion in the morning.  Patient will be [facility] to [facility] and cardiology service will consult in the morning.  Type 2 diabetes with peripheral neuropathy Hemoglobin A1c 6.7 on 01/18/2024.  Placed on sensitive sliding scale insulin .  Continue gabapentin .  Hypertension Currently normotensive.  Continue oral metoprolol  and Cardizem  drip.  Hold amlodipine , losartan, and chlorthalidone at this time to avoid hypotension.  Hyperlipidemia Continue Lipitor.  BPH Continue Flomax .  OSA Continue nightly CPAP.  Anxiety/depression Continue Cymbalta  and trazodone .  Chronic constipation Continue home medications.  DVT prophylaxis: IV heparin  gtt Code Status: Full Code (discussed with the patient) Family Communication: No family available at this time. Consults called: Cardiology Level of care: Progressive Care Unit Admission status: It is my clinical opinion that admission to INPATIENT is reasonable and necessary because of the expectation that this patient will require hospital care that crosses at least 2 midnights to treat this condition based on the medical complexity of the problems presented.  Given the aforementioned information, the predictability of an adverse outcome is felt to be significant.  Editha Ram MD [facility]  If 7PM-7AM, please contact night-coverage [email or web address]  02/01/2024, 8:51 PM

## 2024-02-01 NOTE — ED Triage Notes (Signed)
 Pt BIBA from [facility] for racing heart. Did exertional PT this morning and felt like the palpitations were getting worse. 18ga LAC 700cc NS. A&O x4. Pt states he drinks very little during the day  136/84 HR 150 96% RA Cbg 145

## 2024-02-01 NOTE — ED Provider Notes (Signed)
 [place] EMERGENCY DEPARTMENT AT [facility] Provider Note   CSN: [id number] Arrival date & time: 02/01/24  1456     Patient presents with: Tachycardia   Alan Lin is a 68 y.o. male.   68 year old male presents for evaluation of tachycardia.  Was sent from nursing home.  He states he feels fatigued and generally weak but otherwise really has no complaints.  States he was under because his heart was racing.  He did some physical therapy this morning.  He is not feeling palpitations chest pain or shortness of breath.  Denies any other symptoms or concerns at this time.  On review of chart he has a history of atrial fibrillation is on Eliquis         Prior to Admission medications   Medication Sig Start Date End Date Taking? Authorizing Provider  acetaminophen  (TYLENOL ) 325 MG tablet Take 1 tablet (325 mg total) by mouth every 6 (six) hours as needed for mild pain (or Fever >/= 101). Patient not taking: Reported on 12/13/2023 07/28/22   Elgergawy, Brayton RAMAN, MD  apixaban  (ELIQUIS ) 5 MG TABS tablet Take 1 tablet (5 mg total) by mouth 2 (two) times daily. 01/18/24   Patsy Alm, MD  atorvastatin  (LIPITOR) 10 MG tablet Take 1 tablet by mouth daily. 10/12/23 10/06/24  [provider]  BIOTIN PO Take 1 tablet by mouth daily.    [provider]  cyclobenzaprine  (FLEXERIL ) 10 MG tablet Take 10 mg by mouth at bedtime.    [provider]  diltiazem  (CARDIZEM  CD) 360 MG 24 hr capsule Take 1 capsule (360 mg total) by mouth daily. 01/19/24   Patsy Alm, MD  Docusate Sodium  (DSS) 100 MG CAPS Take 1 capsule by mouth daily. 10/12/23 10/11/24  [provider]  DULoxetine  (CYMBALTA ) 60 MG capsule Take 1 capsule by mouth daily. 10/12/23   [provider]  gabapentin  (NEURONTIN ) 300 MG capsule Take 300-1,500 mg by mouth daily. Take 300mg  (1 capsule) by mouth in the mornings and 1500mg  (5 capsules) in the evening. 08/03/16   [provider]  GAVILAX 17 GM/SCOOP powder Take 17 g by mouth daily as needed for mild constipation or moderate constipation. 10/01/23   [provider]  JARDIANCE  10 MG TABS tablet Take 10 mg by mouth daily. 08/09/22   [provider]  magnesium  30 MG tablet Take 30 mg by mouth in the morning.    [provider]  methocarbamol  (ROBAXIN ) 500 MG tablet Take 1 tablet (500 mg total) by mouth every 8 (eight) hours as needed for muscle spasms. 01/18/24 01/07/25  Patsy Alm, MD  metoprolol  tartrate (LOPRESSOR ) 100 MG tablet Take 1 tablet (100 mg total) by mouth 2 (two) times daily. 01/19/24   Patsy Alm, MD  Multiple Vitamins-Minerals (MULTIVITAMIN WITH MINERALS) tablet Take 1 tablet by mouth daily.    [provider]  Semaglutide  (RYBELSUS ) 7 MG TABS Take 1 tablet by mouth daily.    [provider]  senna (SENOKOT) 8.6 MG tablet Take 1 tablet by mouth daily. 10/12/23 10/11/24  [provider]  tamsulosin  (FLOMAX ) 0.4 MG CAPS capsule Take 0.4 mg by mouth daily. 09/28/22   [provider]  traZODone  (DESYREL ) 50 MG tablet Take 50 mg by mouth at bedtime. 10/12/23 08/28/24  [provider]    Allergies: Metformin, Oxycodone, Oxycodone-acetaminophen , Lisinopril, Morphine, and Morphine and codeine    Review of Systems  Constitutional:  Positive for fatigue. Negative for chills and fever.  HENT:  Negative for ear pain and sore throat.   Eyes:  Negative for pain and visual disturbance.  Respiratory:  Negative for cough and shortness of breath.   Cardiovascular:  Negative for chest pain and palpitations.  Gastrointestinal:  Negative for abdominal pain and vomiting.  Genitourinary:  Negative for dysuria and hematuria.  Musculoskeletal:  Negative for arthralgias and back pain.  Skin:  Negative for color change and rash.  Neurological:  Positive for weakness. Negative for seizures and syncope.  All other systems reviewed and are negative.   Updated  Vital Signs BP (!) 129/92   Pulse (!) 142   Temp 99.1 F (37.3 C) (Oral)   Resp 13   SpO2 96%   Physical Exam Vitals and nursing note reviewed.  Constitutional:      General: He is not in acute distress.    Appearance: Normal appearance. He is well-developed. He is not ill-appearing.     Comments: Chronically ill-appearing  HENT:     Head: Normocephalic and atraumatic.  Eyes:     Conjunctiva/sclera: Conjunctivae normal.  Cardiovascular:     Rate and Rhythm: Regular rhythm. Tachycardia present.     Pulses: Normal pulses.     Heart sounds: Normal heart sounds. No murmur heard. Pulmonary:     Effort: Pulmonary effort is normal. No respiratory distress.     Breath sounds: Normal breath sounds. No stridor. No wheezing or rhonchi.  Abdominal:     General: There is no distension.     Palpations: Abdomen is soft. There is no mass.     Tenderness: There is no abdominal tenderness.     Hernia: No hernia is present.  Musculoskeletal:        General: No swelling.     Cervical back: Neck supple.  Skin:    General: Skin is warm and dry.     Capillary Refill: Capillary refill takes less than 2 seconds.  Neurological:     Mental Status: He is alert.  Psychiatric:        Mood and Affect: Mood normal.     (all labs ordered are listed, but only abnormal results are displayed) Labs Reviewed  BASIC METABOLIC PANEL WITH GFR - Abnormal; Notable for the following components:      Result Value   Glucose, Bld 125 (*)    Creatinine, Ser 0.57 (*)    All other components within normal limits  CBC WITH DIFFERENTIAL/PLATELET - Abnormal; Notable for the following components:   MCH 25.2 (*)    MCHC 29.6 (*)    All other components within normal limits  RESP PANEL BY RT-PCR (RSV, FLU A&B, COVID)  RVPGX2  BRAIN NATRIURETIC PEPTIDE  URINALYSIS, ROUTINE W REFLEX MICROSCOPIC  I-STAT CG4 LACTIC ACID, ED  TROPONIN I (HIGH SENSITIVITY)  TROPONIN I (HIGH SENSITIVITY)    EKG: EKG  Interpretation Date/Time:  Thursday February 01 2024 18:18:09 EDT Ventricular Rate:  141 PR Interval:  88 QRS Duration:  102 QT Interval:  338 QTC Calculation: 518 R Axis:   74  Text Interpretation: Junctional tachycardia Low voltage, precordial leads Borderline repolarization abnormality Prolonged QT interval Confirmed by Gennaro Bouchard ([id number]) on 02/01/2024 6:23:37 PM  Radiology: CT Angio Chest Pulmonary Embolism (PE) W or WO Contrast Result Date: 02/01/2024 CLINICAL DATA:  tachycardia, sob EXAM: CT ANGIOGRAPHY CHEST WITH CONTRAST TECHNIQUE: Multidetector CT imaging of the chest was performed using the standard protocol during bolus administration of intravenous contrast. Multiplanar CT image reconstructions and MIPs were obtained to evaluate the  vascular anatomy. RADIATION DOSE REDUCTION: This exam was performed according to the departmental dose-optimization program which includes automated exposure control, adjustment of the mA and/or kV according to patient size and/or use of iterative reconstruction technique. CONTRAST:  75mL OMNIPAQUE  IOHEXOL  350 MG/ML SOLN COMPARISON:  February 01, 2024, January 13, 2024 FINDINGS: Pulmonary Embolism: No pulmonary embolism. Cardiovascular: Borderline cardiomegaly. Unchanged small pericardial effusion.Dense multi-vessel coronary atherosclerosis.No aortic aneurysm. Diffuse aortic atherosclerosis. Mediastinum/Nodes: No mediastinal mass. No mediastinal, hilar, or axillary lymphadenopathy. Lungs/Pleura: The midline trachea and bronchi are patent. Trace bilateral pleural effusions. Posterior bibasilar dependent atelectasis. Elevation of the right hemidiaphragm. No focal airspace consolidation or pneumothorax. Musculoskeletal: No acute fracture or destructive bone lesion. Multiple, healed remote left-sided rib fractures. Multilevel degenerative disc disease of the spine. Thoracic DISH. Osteopenia. Small volume symmetric bilateral gynecomastia. Upper Abdomen: No acute  abnormality in the partially visualized upper abdomen. Small nonobstructive bilateral nephrolithiasis. Review of the MIP images confirms the above findings. IMPRESSION: 1. Trace bilateral pleural effusions with bibasilar atelectasis. No pneumonia or pulmonary edema. 2. No pulmonary embolism. 3. Nonobstructive small bilateral nephrolithiasis. No visualized nephrolithiasis. Aortic Atherosclerosis (ICD10-I70.0). Electronically Signed   By: Rogelia Myers M.D.   On: 02/01/2024 20:08   DG Chest 1 View Result Date: 02/01/2024 CLINICAL DATA:  Shortness of breath.  Tachycardia. EXAM: CHEST  1 VIEW COMPARISON:  01/13/2024 FINDINGS: The left lung base and inferior heart are not included on today's image. The included lungs are clear and the cardiac silhouette remains borderline enlarged. Aortic arch calcifications. Mild left glenohumeral degenerative changes. Cervical spine fixation hardware. IMPRESSION: No acute abnormality. The left lung base and inferior heart are not included on today's image. Electronically Signed   By: Elspeth Bathe M.D.   On: 02/01/2024 16:47     Procedures   Medications Ordered in the ED  diltiazem  (CARDIZEM ) 125 mg in dextrose  5% 125 mL (1 mg/mL) infusion (5 mg/hr Intravenous New Bag/Given 02/01/24 2012)  diltiazem  (CARDIZEM ) injection 10 mg (10 mg Intravenous Given 02/01/24 1557)  sodium chloride  0.9 % bolus 500 mL (0 mLs Intravenous Stopped 02/01/24 1638)  metoprolol  tartrate (LOPRESSOR ) injection 5 mg (5 mg Intravenous Given 02/01/24 1802)  iohexol  (OMNIPAQUE ) 350 MG/ML injection 75 mL (75 mLs Intravenous Contrast Given 02/01/24 1842)  metoprolol  tartrate (LOPRESSOR ) injection 5 mg (5 mg Intravenous Given 02/01/24 1855)                                    Medical Decision Making Social determinants of health: lives in a nursing home, hx of dementia  Cardiac monitor interpretation: aflutter/sinus tachycardia, rate 140s   Patient with rapid heart rate.  Heart rate sustained in the  140s.  Patient initially fatigued and lightheaded but this improved.  Vitals otherwise stable.  Gave him multiple doses of metoprolol  as well as Cardizem  without change in his heart rate.  Will start him on Cardizem  drip.  Discussed with cardiology on-call, Dr. Kate who recommended continuing with admission to hospitalist service and they will likely keep patient n.p.o. for cardioversion.  Lab workup otherwise fairly unremarkable and CT PE study negative discussion with Dr. Alfornia, hospitalist and patient will be admitted for further workup and management.  He is agreeable to plan.  Problems Addressed: Atrial flutter with rapid ventricular response (HCC): acute illness or injury that poses a threat to life or bodily functions  Amount and/or Complexity of Data Reviewed Independent Historian: EMS  Details: EMS also provide history.  Patient has a history of dementia and is a poor historian External Data Reviewed: notes.    Details: Prior records reviewed and patient had recent admission for A-fib in the past Labs: ordered. Decision-making details documented in ED Course.    Details: Ordered and reviewed by me and unremarkable Radiology: ordered and independent interpretation performed. Decision-making details documented in ED Course.    Details: Chest x-ray: Ordered and interpreted by me independently of radiology and shows no acute abnormality in ECG/medicine tests: ordered and independent interpretation performed. Decision-making details documented in ED Course.    Details: EKG interpreted by me in the absence of cardiology and shows atrial tachycardia for his atrial flutter with rapid ventricular response and nonspecific changes Discussion of management or test interpretation with external provider(s): Dr. Kate- cardiology - I spoke to him on the phone regarding the patient's case he recommended Cardizem  drip and admission to the hospital service and they will see the patient in  consult  Dr. Alfornia -.  I spoke with her on the phone with the patient and she states the patient to her service for further workup and management  Risk OTC drugs. Prescription drug management. Drug therapy requiring intensive monitoring for toxicity. Decision regarding hospitalization. Diagnosis or treatment significantly limited by social determinants of health. Risk Details: CRITICAL CARE Performed by: Duwaine LITTIE Fusi   Total critical care time: 37 minutes  Critical care time was exclusive of separately billable procedures and treating other patients.  Critical care was necessary to treat or prevent imminent or life-threatening deterioration.  Critical care was time spent personally by me on the following activities: development of treatment plan with patient and/or surrogate as well as nursing, discussions with consultants, evaluation of patient's response to treatment, examination of patient, obtaining history from patient or surrogate, ordering and performing treatments and interventions, ordering and review of laboratory studies, ordering and review of radiographic studies, pulse oximetry and re-evaluation of patient's condition.   Critical Care Total time providing critical care: 37 minutes    Final diagnoses:  Atrial flutter with rapid ventricular response [facility])    ED Discharge Orders     None          Fusi Duwaine LITTIE, DO 02/01/24 2045

## 2024-02-01 NOTE — Progress Notes (Signed)
 PHARMACY - ANTICOAGULATION CONSULT NOTE  Pharmacy Consult for Heparin  Indication: atrial fibrillation  Allergies  Allergen Reactions   Metformin Diarrhea and Nausea And Vomiting   Oxycodone Other (See Comments)    Constipation   Oxycodone-Acetaminophen      Constipation   Lisinopril Cough   Morphine Itching    Other reaction(s): Other (See Comments)  Constipation   Morphine And Codeine Itching    Patient Measurements:    From last admit 01/13/24: Ht 70in wt 108.9 Heparin  dosing wt: 95kg  Vital Signs: Temp: 98.7 F (37.1 C) (08/14 2200) Temp Source: Oral (08/14 2200) BP: 122/78 (08/14 2200) Pulse Rate: 143 (08/14 2200)  Labs: Recent Labs    02/01/24 1535 02/01/24 1800  HGB 14.1  --   HCT 47.6  --   PLT 288  --   CREATININE 0.57*  --   TROPONINIHS 5 5    CrCl cannot be calculated (Unknown ideal weight.).   Medical History: Past Medical History:  Diagnosis Date   Arthritis    Diabetes mellitus without complication (HCC)    Hypertension    Mild CAD    Paroxysmal atrial fibrillation (HCC)    PSVT (paroxysmal supraventricular tachycardia) (HCC)    Sleep apnea     Medications:  Infusions:   diltiazem  (CARDIZEM ) infusion 15 mg/hr (02/01/24 2148)    Assessment: 68 yo M with Afib.  Recently hospitalized for same (01/13/24>>01/19/24).  He was started on Eliquis  during this admission per cardiology which he has not been receiving at [facility].   Pharmacy consulted to dose IV heparin  Baseline Hg, plt WNL.  Baseline Scr <0.8.  Goal of Therapy:  Heparin  level 0.3-0.7 units/ml Monitor platelets by anticoagulation protocol: Yes   Plan:  Heparin  bolus 4000 units IV x1 Initiate Heparin  gtt at 1200 units/hr Check heparin  level 8h after gtt initiated Daily heparin  level & CBC while on heparin  F/U plan re: long-term anticoagulation with DOAC  Rosaline Millet, PharmD 02/01/2024,11:04 PM

## 2024-02-02 ENCOUNTER — Encounter (HOSPITAL_COMMUNITY): Payer: Self-pay | Admitting: Internal Medicine

## 2024-02-02 DIAGNOSIS — I2510 Atherosclerotic heart disease of native coronary artery without angina pectoris: Secondary | ICD-10-CM | POA: Diagnosis not present

## 2024-02-02 DIAGNOSIS — E7800 Pure hypercholesterolemia, unspecified: Secondary | ICD-10-CM

## 2024-02-02 DIAGNOSIS — I2583 Coronary atherosclerosis due to lipid rich plaque: Secondary | ICD-10-CM

## 2024-02-02 DIAGNOSIS — I4892 Unspecified atrial flutter: Secondary | ICD-10-CM | POA: Diagnosis not present

## 2024-02-02 LAB — BASIC METABOLIC PANEL WITH GFR
Anion gap: 8 (ref 5–15)
BUN: 10 mg/dL (ref 8–23)
CO2: 25 mmol/L (ref 22–32)
Calcium: 8.2 mg/dL — ABNORMAL LOW (ref 8.9–10.3)
Chloride: 103 mmol/L (ref 98–111)
Creatinine, Ser: 0.63 mg/dL (ref 0.61–1.24)
GFR, Estimated: >60 mL/min (ref >60)
Glucose, Bld: 149 mg/dL — ABNORMAL HIGH (ref 70–99)
Potassium: 3.2 mmol/L — ABNORMAL LOW (ref 3.5–5.1)
Sodium: 136 mmol/L (ref 135–145)

## 2024-02-02 LAB — CBC
HCT: 41.9 % (ref 39.0–52.0)
Hemoglobin: 13.2 g/dL (ref 13.0–17.0)
MCH: 26 pg (ref 26.0–34.0)
MCHC: 31.5 g/dL (ref 30.0–36.0)
MCV: 82.6 fL (ref 80.0–100.0)
Platelets: 337 K/uL (ref 150–400)
RBC: 5.07 MIL/uL (ref 4.22–5.81)
RDW: 14.6 % (ref 11.5–15.5)
WBC: 10.8 K/uL — ABNORMAL HIGH (ref 4.0–10.5)
nRBC: 0 % (ref 0.0–0.2)

## 2024-02-02 LAB — URINALYSIS, ROUTINE W REFLEX MICROSCOPIC
Bacteria, UA: NONE SEEN
Bilirubin Urine: NEGATIVE
Glucose, UA: >=500 mg/dL — AB
Hgb urine dipstick: NEGATIVE
Ketones, ur: 5 mg/dL — AB
Leukocytes,Ua: NEGATIVE
Nitrite: NEGATIVE
Protein, ur: NEGATIVE mg/dL
Specific Gravity, Urine: >1.046 — ABNORMAL HIGH (ref 1.005–1.030)
pH: 6 (ref 5.0–8.0)

## 2024-02-02 LAB — GLUCOSE, CAPILLARY
Glucose-Capillary: 153 mg/dL — ABNORMAL HIGH (ref 70–99)
Glucose-Capillary: 154 mg/dL — ABNORMAL HIGH (ref 70–99)
Glucose-Capillary: 172 mg/dL — ABNORMAL HIGH (ref 70–99)
Glucose-Capillary: 187 mg/dL — ABNORMAL HIGH (ref 70–99)

## 2024-02-02 LAB — C DIFFICILE QUICK SCREEN W PCR REFLEX
C Diff antigen: NEGATIVE
C Diff interpretation: NOT DETECTED
C Diff toxin: NEGATIVE

## 2024-02-02 LAB — HEPARIN LEVEL (UNFRACTIONATED)
Heparin Unfractionated: 0.89 [IU]/mL — ABNORMAL HIGH (ref 0.30–0.70)
Heparin Unfractionated: 0.33 [IU]/mL (ref 0.30–0.70)

## 2024-02-02 LAB — CBG MONITORING, ED
Glucose-Capillary: 102 mg/dL — ABNORMAL HIGH (ref 70–99)
Glucose-Capillary: 142 mg/dL — ABNORMAL HIGH (ref 70–99)
Glucose-Capillary: 157 mg/dL — ABNORMAL HIGH (ref 70–99)
Glucose-Capillary: 221 mg/dL — ABNORMAL HIGH (ref 70–99)

## 2024-02-02 LAB — MAGNESIUM: Magnesium: 1.7 mg/dL (ref 1.7–2.4)

## 2024-02-02 MED ORDER — POTASSIUM CHLORIDE CRYS ER 20 MEQ PO TBCR
40.0000 meq | EXTENDED_RELEASE_TABLET | Freq: Once | ORAL | Status: AC
  Administered 2024-02-02: 40 meq via ORAL
  Filled 2024-02-02: qty 2

## 2024-02-02 MED ORDER — APIXABAN 2.5 MG PO TABS
5.0000 mg | ORAL_TABLET | ORAL | Status: AC
  Administered 2024-02-02: 5 mg via ORAL
  Filled 2024-02-02: qty 2

## 2024-02-02 MED ORDER — APIXABAN 5 MG PO TABS
5.0000 mg | ORAL_TABLET | Freq: Two times a day (BID) | ORAL | Status: DC
  Administered 2024-02-02 – 2024-02-06 (×8): 5 mg via ORAL
  Filled 2024-02-02 (×7): qty 1

## 2024-02-02 NOTE — Assessment & Plan Note (Signed)
-   Follow-up for final discharge plans - Previously on metoprolol  and Cardizem

## 2024-02-02 NOTE — Assessment & Plan Note (Signed)
 Continue Cymbalta  and trazodone 

## 2024-02-02 NOTE — Progress Notes (Signed)
 PHARMACY - ANTICOAGULATION CONSULT NOTE  Pharmacy Consult for Eliquis  Indication: atrial fibrillation  Allergies  Allergen Reactions   Metformin Diarrhea and Nausea And Vomiting   Oxycodone Other (See Comments)    Constipation   Oxycodone-Acetaminophen      Constipation   Lisinopril Cough   Morphine Itching    Other reaction(s): Other (See Comments)  Constipation   Morphine And Codeine Itching    Patient Measurements:    Vital Signs: Temp: 97.7 F (36.5 C) (08/15 0754) Temp Source: Oral (08/15 0754) BP: 125/87 (08/15 0754) Pulse Rate: 102 (08/15 0754)  Labs: Recent Labs    02/01/24 1535 02/01/24 1800 02/02/24 0200 02/02/24 0531 02/02/24 0535 02/02/24 0646  HGB 14.1  --   --  13.2  --   --   HCT 47.6  --   --  41.9  --   --   PLT 288  --   --  337  --   --   HEPARINUNFRC  --   --   --   --  0.89* 0.33  CREATININE 0.57*  --  0.63  --   --   --   TROPONINIHS 5 5  --   --   --   --     CrCl cannot be calculated (Unknown ideal weight.).   Medical History: Past Medical History:  Diagnosis Date   Arthritis    Diabetes mellitus without complication (HCC)    Hypertension    Mild CAD    Paroxysmal atrial fibrillation (HCC)    PSVT (paroxysmal supraventricular tachycardia) (HCC)    Sleep apnea     Assessment: AC/Heme: Note he was started on Eliquis  per cards during recent admit (discharged 8/1) however Eliquis  & cardizem  were never started at [facility] (did he refuse to take? H/o refusing anticoagulation)  - 8/15 Cards note says he is agreeable to anticoagulation - he manages his own medications at [facility], would benefit from mail order set up through [facility] pharmacy  - [id number]  8/15: IV heparin >Eliquis , Hgb/Plts WNL. Weight and Scr/CrCl ok for afib dosing.  Goal of Therapy:  Therapeutic oral anticoagulation  Plan:  D/c IV heparin  Eliquis  5mg  BID - plan for TEE-DCCV Monday    Ashantia Amaral Karoline Marina, PharmD, [facility] Clinical Staff  Pharmacist  Marina Salines Stillinger 02/02/2024,10:33 AM

## 2024-02-02 NOTE — Progress Notes (Addendum)
 PHARMACY - ANTICOAGULATION CONSULT NOTE  Pharmacy Consult for Heparin  Indication: atrial fibrillation  Allergies  Allergen Reactions   Metformin Diarrhea and Nausea And Vomiting   Oxycodone Other (See Comments)    Constipation   Oxycodone-Acetaminophen      Constipation   Lisinopril Cough   Morphine Itching    Other reaction(s): Other (See Comments)  Constipation   Morphine And Codeine Itching    Patient Measurements:    From last admit 01/13/24: Ht 70in wt 108.9 Heparin  dosing wt: 95kg  Vital Signs: Temp: 98.2 F (36.8 C) (08/15 0627) Temp Source: Oral (08/15 0627) BP: 97/62 (08/15 0430) Pulse Rate: 95 (08/15 0430)  Labs: Recent Labs    02/01/24 1535 02/01/24 1800 02/02/24 0200 02/02/24 0531 02/02/24 0535  HGB 14.1  --   --  13.2  --   HCT 47.6  --   --  41.9  --   PLT 288  --   --  337  --   HEPARINUNFRC  --   --   --   --  0.89*  CREATININE 0.57*  --  0.63  --   --   TROPONINIHS 5 5  --   --   --     CrCl cannot be calculated (Unknown ideal weight.).   Medical History: Past Medical History:  Diagnosis Date   Arthritis    Diabetes mellitus without complication (HCC)    Hypertension    Mild CAD    Paroxysmal atrial fibrillation (HCC)    PSVT (paroxysmal supraventricular tachycardia) (HCC)    Sleep apnea     Medications:  Infusions:   diltiazem  (CARDIZEM ) infusion 10 mg/hr (02/02/24 9366)   heparin  1,200 Units/hr (02/01/24 2359)    Assessment: 68 yo M with Afib.  Recently hospitalized for same (01/13/24>>01/19/24).  He was started on Eliquis  during this admission per cardiology which he has not been receiving at [facility]. Pharmacy consulted to dose IV heparin   Today, 02/02/24: Initial heparin  level 0.89--supratherapeutic on heparin  1200 units/hr Confirmed with RN that heparin  level was drawn from the same arm that heparin  is running in (L arm). Repeat level ordered, to be drawn from the opposite arm of heparin  infusion. Repeat Heparin  level resulted as  0.33--therapeutic on heparin  1200 units/hr Hgb 13.2, plts 337--stable No s/sx of bleeding reported   Goal of Therapy:  Heparin  level 0.3-0.7 units/ml Monitor platelets by anticoagulation protocol: Yes   Plan:  Continue heparin  gtt @1200  units/hr Check confirmatory heparin  level in 8hrs Daily heparin  level & CBC while on heparin  F/U plan re: long-term anticoagulation with DOAC   Lacinda Moats, PharmD Clinical Pharmacist  8/15/20256:36 AM

## 2024-02-02 NOTE — Hospital Course (Signed)
 Alan Lin is a 68 year old male with PMH PAF, PSVT, nonobstructive CAD, HTN, HLD, osteoarthritis, DM II, peripheral neuropathy, BPH, cervical/lumbar DDD, OSA on CPAP, depression/anxiety. Recent hospital admission 7/26-8/1 for persistent atrial flutter with RVR.  He was previously not on anticoagulation due to his fall risk but decision was made to initiate anticoagulation during that hospitalization.  Cardiology was consulted too and he was discharged on Eliquis , Cardizem  360 mg daily, and metoprolol  100 mg twice daily.   Now presents for this hospitalization from [facility] for evaluation of tachycardia and some heart palpitations during physical therapy evaluation. He was noted to be in a flutter with RVR, rate 140s on arrival. Appears that he has not been on Eliquis  since discharge from prior hospitalization; reasons unknown. He was started on a Cardizem  drip and admitted for cardiology evaluation as well. He was able to undergo TEE and cardioversion on 02/05/2024 with conversion back to NSR.

## 2024-02-02 NOTE — Consult Note (Addendum)
 Cardiology Consultation   Patient ID: Alan Lin MRN: [id number]; DOB: Aug 18, 1955  Admit date: 02/01/2024 Date of Consult: 02/02/2024  PCP:  Agustina Greig NOVAK, MD   [facility] Providers Cardiologist:  [facility]     Patient Profile: Alan Lin is a 68 y.o. 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 Patsy.  History of Present Illness: Mr. Bartolomei has been followed by Dr. Marcela at [facility] and has seen Dr. Francyne through local ED referral.  In 2019 he underwent stress echocardiogram (treadmill) that showed findings suggestive of at least moderate probability of clinically significant coronary artery disease with greater than 1 mm ST depression in inferior and lateral leads during exercise and recovery.  This led to heart catheterization which showed nonobstructive CAD including 40% stenosis in the proximal RCA and 30% stenosis in OM1 treated medically.  He was diagnosed with atrial fibrillation in 07/2022 during an admission for multiple falls with question of syncope in the setting of gastroenteritis.  He was started on IV amiodarone  and spontaneously converted back to NSR.  He was placed on Eliquis  and transition to oral amiodarone .  Event monitor was planned with [facility] but not completed.  Nuclear stress test 08/2022 showed small size reversible mild defect in the mid to apical lateral, inferior distribution.  He was seen by Dr. Francyne in follow-up and was noted to be in NSR.  He was maintained on Eliquis  and amiodarone , but amiodarone  felt likely not the best long-term option given his age.  Amiodarone  was discontinued and heart monitor planned after washout.  There is also mention that Eliquis  was also discontinued.   Follow-up heart monitor showed greater than 3000 brief episodes of SVT, noted probable A-fib in 12 days.  Due to frequent falls, discussion of Eliquis  resumption was pursued.  Amiodarone  was also discussed but felt likely not the best option given his subclinical hypothyroidism.  He was subsequently started on Multaq  01/2023 which decreased burden to 2% SVT.  He is continue to have ground-level mechanical falls, last admitted 10/2023 in the setting of fall and sepsis due to acute cystitis.  He was recently hospitalized 01/13/2024 with knee pain.  He is primarily bedbound at this point and resides in a SNF.  EKG felt consistent with atrial flutter that was unresponsive to IV metoprolol .  He was admitted and rate control attempted.  TEE-DCCV was discussed but the patient declined anticoagulation.  Therefore rate control was pursued and he was subsequently discharged back to SNF.  It appears he was discharged on 5 mg Eliquis  twice daily, 360 mg Cardizem  daily, and 100 mg Lopressor  twice daily.  Echocardiogram during that admission 01/13/2024 showed an LVEF 70-75%, no RWMA, normal RV function, moderate pericardial effusion without evidence of cardiac tamponade, and moderate MAC.  He was BIP to [facility] ED for heart racing yesterday 02/01/24.  Telemetry felt consistent with atrial flutter versus sinus tachycardia with rates in the 140s. Cardizem  gtt started and now running at 10 mg/hr along with heparin  gtt.   During my exam, he is generally asymptomatic. He states he has not fallen since the beginning of May. He is able to ambulate about 170 ft and is working with PT at [facility]. He manages his own medications but  does not think he was taking cardizem  or eliquis  after recent discharge. Hasn't been able to get mail order set up.    Past Medical History:  Diagnosis Date   Arthritis    Diabetes mellitus without complication (HCC)    Hypertension    Mild CAD    Paroxysmal atrial fibrillation (HCC)    PSVT  (paroxysmal supraventricular tachycardia) (HCC)    Sleep apnea     No past surgical history on file.   Home Medications:  Prior to Admission medications   Medication Sig Start Date End Date Taking? Authorizing Provider  amLODipine  (NORVASC ) 10 MG tablet Take 10 mg by mouth daily.   Yes [provider]  atorvastatin  (LIPITOR) 10 MG tablet Take 1 tablet by mouth daily. 10/12/23 10/06/24 Yes [provider]  BIOTIN PO Take 1 tablet by mouth daily.   Yes [provider]  chlorthalidone (HYGROTON) 25 MG tablet Take 25 mg by mouth daily.   Yes [provider]  cyclobenzaprine  (FLEXERIL ) 10 MG tablet Take 10 mg by mouth at bedtime.   Yes [provider]  Docusate Sodium  (DSS) 100 MG CAPS Take 1 capsule by mouth daily. 10/12/23 10/11/24 Yes [provider]  dronedarone  (MULTAQ ) 400 MG tablet Take 400 mg by mouth 2 (two) times daily with a meal.   Yes [provider]  DULoxetine  (CYMBALTA ) 60 MG capsule Take 1 capsule by mouth daily. 10/12/23  Yes [provider]  gabapentin  (NEURONTIN ) 300 MG capsule Take 300-1,500 mg by mouth daily. Take 300mg  (1 capsule) by mouth in the mornings and 1500mg  (5 capsules) in the evening. 08/03/16  Yes [provider]  GAVILAX 17 GM/SCOOP powder Take 17 g by mouth daily as needed for mild constipation or moderate constipation. 10/01/23  Yes [provider]  JARDIANCE  10 MG TABS tablet Take 10 mg by mouth daily. 08/09/22  Yes [provider]  losartan (COZAAR) 50 MG tablet Take 50 mg by mouth daily. 01/23/24  Yes [provider]  magnesium  30 MG tablet Take 30 mg by mouth in the morning.   Yes [provider]  methocarbamol  (ROBAXIN ) 500 MG tablet Take 1 tablet (500 mg total) by mouth every 8 (eight) hours as needed for muscle spasms. 01/18/24 01/07/25 Yes Patsy Lenis, MD  metoprolol  tartrate (LOPRESSOR ) 100 MG tablet Take 1 tablet (100 mg total) by mouth 2 (two)  times daily. 01/19/24  Yes Patsy Lenis, MD  Multiple Vitamins-Minerals (MULTIVITAMIN WITH MINERALS) tablet Take 1 tablet by mouth daily.   Yes [provider]  Semaglutide  (RYBELSUS ) 7 MG TABS Take 1 tablet by mouth daily.   Yes [provider]  senna (SENOKOT) 8.6 MG tablet Take 1 tablet by mouth daily. 10/12/23 10/11/24 Yes [provider]  tamsulosin  (FLOMAX ) 0.4 MG CAPS capsule Take 0.4 mg by mouth daily. 09/28/22  Yes [provider]  traZODone  (DESYREL ) 50 MG tablet Take 50 mg by mouth at bedtime. 10/12/23 08/28/24 Yes [provider]  acetaminophen  (TYLENOL ) 325 MG tablet Take 1 tablet (325 mg total) by mouth every 6 (six) hours as needed for mild pain (or Fever >/= 101). Patient not taking: Reported on 12/13/2023 07/28/22   Elgergawy, Brayton RAMAN, MD  apixaban  (ELIQUIS ) 5 MG TABS tablet Take 1 tablet (5 mg total) by mouth 2 (two) times daily. 01/18/24   Patsy Lenis, MD  diltiazem  (CARDIZEM  CD) 360 MG 24 hr capsule Take 1 capsule (360 mg total) by mouth daily. 01/19/24   Patsy Lenis,  MD    Scheduled Meds:  atorvastatin   10 mg Oral Daily   docusate sodium   100 mg Oral Daily   DULoxetine   60 mg Oral Daily   gabapentin   300 mg Oral Daily   And   gabapentin   1,500 mg Oral QHS   insulin  aspart  0-9 Units Subcutaneous Q4H   metoprolol  tartrate  100 mg Oral BID   senna  1 tablet Oral Daily   tamsulosin   0.4 mg Oral Daily   traZODone   50 mg Oral QHS   Continuous Infusions:  diltiazem  (CARDIZEM ) infusion 10 mg/hr (02/02/24 9366)   heparin  1,200 Units/hr (02/01/24 2359)   PRN Meds:   Allergies:    Allergies  Allergen Reactions   Metformin Diarrhea and Nausea And Vomiting   Oxycodone Other (See Comments)    Constipation   Oxycodone-Acetaminophen      Constipation   Lisinopril Cough   Morphine Itching    Other reaction(s): Other (See Comments)  Constipation   Morphine And Codeine Itching    Social History:   Social History    Socioeconomic History   Marital status: Single    Spouse name: Not on file   Number of children: Not on file   Years of education: Not on file   Highest education level: Not on file  Occupational History   Not on file  Tobacco Use   Smoking status: Never   Smokeless tobacco: Never  Substance and Sexual Activity   Alcohol use: Never   Drug use: Never   Sexual activity: Not on file  Other Topics Concern   Not on file  Social History Narrative   Are you right handed or left handed? Right Handed    Are you currently employed ? No    What is your current occupation? Retired    Do you live at home alone? yes   Who lives with you? No one    What type of home do you live in: 1 story or 2 story? One story home       Social Drivers of Health   Financial Resource Strain: Low Risk  (08/29/2023)   [facility]   Overall Financial Resource Strain (CARDIA)    Difficulty of Paying Living Expenses: Not hard at all  Food Insecurity: No Food Insecurity (01/14/2024)   Hunger Vital Sign    Worried About Running Out of Food in the Last Year: Never true    Ran Out of Food in the Last Year: Never true  Transportation Needs: No Transportation Needs (01/14/2024)   PRAPARE - Administrator, Civil Service (Medical): No    Lack of Transportation (Non-Medical): No  Physical Activity: Insufficiently Active (08/29/2023)   Received from [facility]   Exercise Vital Sign    On average, how many days per week do you engage in moderate to strenuous exercise (like a brisk walk)?: 3 days    On average, how many minutes do you engage in exercise at this level?: 30 min  Stress: Not on file  Social Connections: Unknown (01/13/2024)   Social Connection and Isolation Panel    Frequency of Communication with Friends and Family: Three times a week    Frequency of Social Gatherings with Friends and Family: Three times a week    Attends Religious Services: 1 to 4 times per year     Active Member of Clubs or Organizations: Patient declined    Attends Banker Meetings: 1 to  4 times per year    Marital Status: Patient declined  Intimate Partner Violence: Not At Risk (01/14/2024)   Humiliation, Afraid, Rape, and Kick questionnaire    Fear of Current or Ex-Partner: No    Emotionally Abused: No    Physically Abused: No    Sexually Abused: No    Family History:    Family History  Problem Relation Age of Onset   Dementia Mother    Alzheimer's disease Mother    Kidney Stones Father      ROS:  Please see the history of present illness.   All other ROS reviewed and negative.     Physical Exam/Data: Vitals:   02/02/24 0430 02/02/24 0627 02/02/24 0730 02/02/24 0754  BP: 97/62  125/87 125/87  Pulse: 95  90 (!) 102  Resp: 20  20 16   Temp:  98.2 F (36.8 C)  97.7 F (36.5 C)  TempSrc:  Oral  Oral  SpO2: 94%  96% 94%    Intake/Output Summary (Last 24 hours) at 02/02/2024 0913 Last data filed at 02/01/2024 1638 Gross per 24 hour  Intake 500 ml  Output --  Net 500 ml      01/13/2024    1:38 AM 11/20/2023    5:51 PM 10/22/2023    5:08 AM  Last 3 Weights  Weight (lbs) 240 lb 240 lb 241 lb 6.5 oz  Weight (kg) 108.863 kg 108.863 kg 109.5 kg     There is no height or weight on file to calculate BMI.  General: obese male in NAD Neck: no JVD Cardiac:  irregular rhythm, tachycardic rate Lungs:  respirations unlabored  Abd: soft, nontender, no hepatomegaly  Ext: no edema Musculoskeletal:  No deformities, BUE and BLE strength normal and equal Skin: warm and dry  Neuro:  CNs 2-12 intact, no focal abnormalities noted Psych:  Normal affect   EKG:  The EKG was personally reviewed and demonstrates:  atrial flutter with VR 141, appears 2:1 Telemetry:  Telemetry was personally reviewed and demonstrates:  atrial flutter with rates in the Afib with rates now in the 100-110s  Relevant CV Studies:  Echo 01/13/24:  1. Left ventricular ejection fraction, by  estimation, is 70 to 75%. Left  ventricular ejection fraction by 2D MOD biplane is 72.6 %. The left  ventricle has hyperdynamic function. The left ventricle has no regional  wall motion abnormalities. There is mild   left ventricular hypertrophy. Left ventricular diastolic parameters are  indeterminate.   2. Right ventricular systolic function is normal. The right ventricular  size is mildly enlarged. Tricuspid regurgitation signal is inadequate for  assessing PA pressure.   3. Moderate pericardial effusion, more adjacent to the RV - the IVC does  not collapse, but there is no RA or RV collapse or exaggerated inflow  variation to suggest tamponade - clinical correlation is advised,  especially given the elevated HR. Moderate  pericardial effusion. The pericardial effusion is circumferential. There  is no evidence of cardiac tamponade.   4. The mitral valve is grossly normal. Trivial mitral valve  regurgitation. No evidence of mitral stenosis. Moderate mitral annular  calcification.   5. The aortic valve is tricuspid. Aortic valve regurgitation is not  visualized. Aortic valve sclerosis is present, with no evidence of aortic  valve stenosis.   6. The inferior vena cava is dilated in size with <50% respiratory  variability, suggesting right atrial pressure of 15 mmHg.   Laboratory Data: High Sensitivity Troponin:   Recent Labs  Lab 01/13/24 0305 02/01/24 1535 02/01/24 1800  TROPONINIHS 7 5 5      Chemistry Recent Labs  Lab 02/01/24 1535 02/02/24 0200  NA 138 136  K 3.6 3.2*  CL 105 103  CO2 22 25  GLUCOSE 125* 149*  BUN 9 10  CREATININE 0.57* 0.63  CALCIUM  9.0 8.2*  MG  --  1.7  GFRNONAA >60 >60  ANIONGAP 11 8    No results for input(s): PROT, ALBUMIN, AST, ALT, ALKPHOS, BILITOT in the last 168 hours. Lipids No results for input(s): CHOL, TRIG, HDL, LABVLDL, LDLCALC, CHOLHDL in the last 168 hours.  Hematology Recent Labs  Lab 02/01/24 1535  02/02/24 0531  WBC 9.1 10.8*  RBC 5.60 5.07  HGB 14.1 13.2  HCT 47.6 41.9  MCV 85.0 82.6  MCH 25.2* 26.0  MCHC 29.6* 31.5  RDW 14.5 14.6  PLT 288 337   Thyroid  No results for input(s): TSH, FREET4 in the last 168 hours.  BNP Recent Labs  Lab 02/01/24 1535  BNP 31.9    DDimer No results for input(s): DDIMER in the last 168 hours.  Radiology/Studies:  CT Angio Chest Pulmonary Embolism (PE) W or WO Contrast Result Date: 02/01/2024 CLINICAL DATA:  tachycardia, sob EXAM: CT ANGIOGRAPHY CHEST WITH CONTRAST TECHNIQUE: Multidetector CT imaging of the chest was performed using the standard protocol during bolus administration of intravenous contrast. Multiplanar CT image reconstructions and MIPs were obtained to evaluate the vascular anatomy. RADIATION DOSE REDUCTION: This exam was performed according to the departmental dose-optimization program which includes automated exposure control, adjustment of the mA and/or kV according to patient size and/or use of iterative reconstruction technique. CONTRAST:  75mL OMNIPAQUE  IOHEXOL  350 MG/ML SOLN COMPARISON:  February 01, 2024, January 13, 2024 FINDINGS: Pulmonary Embolism: No pulmonary embolism. Cardiovascular: Borderline cardiomegaly. Unchanged small pericardial effusion.Dense multi-vessel coronary atherosclerosis.No aortic aneurysm. Diffuse aortic atherosclerosis. Mediastinum/Nodes: No mediastinal mass. No mediastinal, hilar, or axillary lymphadenopathy. Lungs/Pleura: The midline trachea and bronchi are patent. Trace bilateral pleural effusions. Posterior bibasilar dependent atelectasis. Elevation of the right hemidiaphragm. No focal airspace consolidation or pneumothorax. Musculoskeletal: No acute fracture or destructive bone lesion. Multiple, healed remote left-sided rib fractures. Multilevel degenerative disc disease of the spine. Thoracic DISH. Osteopenia. Small volume symmetric bilateral gynecomastia. Upper Abdomen: No acute abnormality in the  partially visualized upper abdomen. Small nonobstructive bilateral nephrolithiasis. Review of the MIP images confirms the above findings. IMPRESSION: 1. Trace bilateral pleural effusions with bibasilar atelectasis. No pneumonia or pulmonary edema. 2. No pulmonary embolism. 3. Nonobstructive small bilateral nephrolithiasis. No visualized nephrolithiasis. Aortic Atherosclerosis (ICD10-I70.0). Electronically Signed   By: Rogelia Myers M.D.   On: 02/01/2024 20:08   DG Chest 1 View Result Date: 02/01/2024 CLINICAL DATA:  Shortness of breath.  Tachycardia. EXAM: CHEST  1 VIEW COMPARISON:  01/13/2024 FINDINGS: The left lung base and inferior heart are not included on today's image. The included lungs are clear and the cardiac silhouette remains borderline enlarged. Aortic arch calcifications. Mild left glenohumeral degenerative changes. Cervical spine fixation hardware. IMPRESSION: No acute abnormality. The left lung base and inferior heart are not included on today's image. Electronically Signed   By: Elspeth Bathe M.D.   On: 02/01/2024 16:47     Assessment and Plan:  Atrial flutter with RVR Hx of SVT Chronic anticoagulation Frequent falls, now bedbound - has previously been on multaq  - amiodarone  felt likely not the best option due to thyroid  disease - recently admitted with RVR and there was question regarding compliance with  OAC, so TEE-DCCV was not pursued - pt was discharged on eliquis , compliance again not clear - he is agreeable to anticoagulation - he manages his own medications at [facility], would benefit from mail order set up through [facility] pharmacy - not rate controlled likely due to not taking cardizem  - currently running on 10 mg/hr cardizem  gtt with borderline BP  and rates in the 100s - consider transitioning to PO dosing tomorrow pending BP - will transition heparin  to eliquis  5 mg BID today - thankfully he is generally asymptomatic with RVR - plan for TEE-DCCV  Monday   Nonobstructive CAD - no chest pain - no antiplatelet due to eliquis     Informed Consent   Shared Decision Making/Informed Consent   The risks [stroke, cardiac arrhythmias rarely resulting in the need for a temporary or permanent pacemaker, skin irritation or burns, esophageal damage, perforation (1:10,000 risk), bleeding, pharyngeal hematoma as well as other potential complications associated with conscious sedation including aspiration, arrhythmia, respiratory failure and death], benefits (treatment guidance, restoration of normal sinus rhythm, diagnostic support) and alternatives of a transesophageal echocardiogram guided cardioversion were discussed in detail with Mr. Jaspers and he is willing to proceed.     Risk Assessment/Risk Scores:       CHA2DS2-VASc Score = 4   This indicates a 4.8% annual risk of stroke. The patient's score is based upon: CHF History: 0 HTN History: 1 Diabetes History: 1 Stroke History: 0 Vascular Disease History: 1 (mild CAD, aortic atherosclerosis) Age Score: 1 Gender Score: 0      For questions or updates, please contact [facility] Please consult [email or web address] for contact info under    Signed, Jon Nat Hails, PA  02/02/2024 9:13 AM

## 2024-02-02 NOTE — ED Notes (Signed)
 RN attempted to call 4E no answer green man available RN to transport patient

## 2024-02-02 NOTE — Assessment & Plan Note (Signed)
-   Continue Cymbalta  and trazodone

## 2024-02-02 NOTE — Assessment & Plan Note (Signed)
-  Continue gabapentin and Cymbalta

## 2024-02-02 NOTE — Plan of Care (Signed)

## 2024-02-02 NOTE — Assessment & Plan Note (Signed)
-   On Jardiance  and semaglutide  - SSI while inpatient - Last A1c 6.7% on 01/18/2024

## 2024-02-02 NOTE — Assessment & Plan Note (Signed)
-   On Lipitor at home 

## 2024-02-02 NOTE — ED Notes (Addendum)
 Patient has requested that any potassium administered be the powdered form.  He did take the tablets but prefers the powder.

## 2024-02-02 NOTE — Discharge Instructions (Signed)

## 2024-02-02 NOTE — Progress Notes (Signed)
 Progress Note    Alan Lin   FMW:[id number]  DOB: 16-Jul-1955  DOA: 02/01/2024     1 PCP: Alan Greig NOVAK, MD  Initial CC: elevated HR  Hospital Course: Alan Lin is a 68 year old male with PMH PAF, PSVT, nonobstructive CAD, HTN, HLD, osteoarthritis, DM II, peripheral neuropathy, BPH, cervical/lumbar DDD, OSA on CPAP, depression/anxiety. Recent hospital admission 7/26-8/1 for persistent atrial flutter with RVR.  He was previously not on anticoagulation due to his fall risk but decision was made to initiate anticoagulation during that hospitalization.  Cardiology was consulted too and he was discharged on Eliquis , Cardizem  360 mg daily, and metoprolol  100 mg twice daily.   Now presents for this hospitalization from [facility] for evaluation of tachycardia and some heart palpitations during physical therapy evaluation. He was noted to be in a flutter with RVR, rate 140s on arrival. Appears that he has not been on Eliquis  since discharge from prior hospitalization; reasons unknown. He was started on a Cardizem  drip and admitted for cardiology evaluation as well.  Interval History:  Resting in bed comfortable this morning.  Slightly poor historian.  Denies any chest pain, shortness of breath, palpitations.  Remains in A-fib with mild RVR (rate better now).   Assessment and Plan: * Atrial flutter with rapid ventricular response (HCC) - CTA chest negative for PE - Some suspected noncompliance with meds, unclear reasons - Previously discharged on Lopressor  and Cardizem  - Cardiology again following, appreciate assistance - Due to gap in Eliquis , tentative plan is for TEE with cardioversion on Monday -Will need to ensure medication compliance at discharge and ability to administer effectively.  May also need home health RN temporarily  Diabetic neuropathy (HCC) - Continue gabapentin  and Cymbalta   Depressive disorder Continue Cymbalta  and trazodone   BPH (benign prostatic  hyperplasia) - Continue Flomax   OSA (obstructive sleep apnea) - Continue CPAP at bedtime  Essential hypertension - Follow-up for final discharge plans - Previously on metoprolol  and Cardizem   Hyperlipidemia - On Lipitor at home  Anxiety - Continue Cymbalta  and trazodone   Type 2 diabetes mellitus (HCC) - On Jardiance  and semaglutide  - SSI while inpatient - Last A1c 6.7% on 01/18/2024    Old records reviewed in assessment of this patient  Antimicrobials:   DVT prophylaxis:   apixaban  (ELIQUIS ) tablet 5 mg   Code Status:   Code Status: Full Code  Mobility Assessment (Last 72 Hours)     Mobility Assessment     Row Name 02/02/24 1249           Does the patient have exclusion criteria? Yes - Bedfast (Level 1) - Select exclusion criteria in next row       Mobility Assessment Exclusion Criteria Order for bedrest (clairify order)          Barriers to discharge: none Disposition Plan:  Home  HH orders placed: TBD Status is: Inpt  Objective: Blood pressure 99/61, pulse (!) 173, temperature 98.1 F (36.7 C), temperature source Oral, resp. rate 19, height 5' 10 (1.778 m), weight 108.9 kg, SpO2 91%.  Examination:  Physical Exam Constitutional:      General: He is not in acute distress.    Appearance: Normal appearance.  HENT:     Head: Normocephalic and atraumatic.     Mouth/Throat:     Mouth: Mucous membranes are moist.  Eyes:     Extraocular Movements: Extraocular movements intact.  Cardiovascular:     Rate and Rhythm: Tachycardia present. Rhythm irregular.  Pulmonary:  Effort: Pulmonary effort is normal. No respiratory distress.     Breath sounds: Normal breath sounds. No wheezing.  Abdominal:     General: Bowel sounds are normal. There is no distension.     Palpations: Abdomen is soft.     Tenderness: There is no abdominal tenderness.  Musculoskeletal:        General: Normal range of motion.     Cervical back: Normal range of motion and neck  supple.  Skin:    General: Skin is warm and dry.  Neurological:     General: No focal deficit present.     Mental Status: He is alert.  Psychiatric:        Mood and Affect: Mood normal.        Behavior: Behavior normal.      Consultants:  Cardiology  Procedures:    Data Reviewed: Results for orders placed or performed during the hospital encounter of 02/01/24 (from the past 24 hours)  Troponin I (High Sensitivity)     Status: None   Collection Time: 02/01/24  3:35 PM  Result Value Ref Range   Troponin I (High Sensitivity) 5 <18 ng/L  Brain natriuretic peptide     Status: None   Collection Time: 02/01/24  3:35 PM  Result Value Ref Range   B Natriuretic Peptide 31.9 0.0 - 100.0 pg/mL  Basic metabolic panel     Status: Abnormal   Collection Time: 02/01/24  3:35 PM  Result Value Ref Range   Sodium 138 135 - 145 mmol/L   Potassium 3.6 3.5 - 5.1 mmol/L   Chloride 105 98 - 111 mmol/L   CO2 22 22 - 32 mmol/L   Glucose, Bld 125 (H) 70 - 99 mg/dL   BUN 9 8 - 23 mg/dL   Creatinine, Ser 9.42 (L) 0.61 - 1.24 mg/dL   Calcium  9.0 8.9 - 10.3 mg/dL   GFR, Estimated >39 >39 mL/min   Anion gap 11 5 - 15  CBC with Differential     Status: Abnormal   Collection Time: 02/01/24  3:35 PM  Result Value Ref Range   WBC 9.1 4.0 - 10.5 K/uL   RBC 5.60 4.22 - 5.81 MIL/uL   Hemoglobin 14.1 13.0 - 17.0 g/dL   HCT 52.3 60.9 - 47.9 %   MCV 85.0 80.0 - 100.0 fL   MCH 25.2 (L) 26.0 - 34.0 pg   MCHC 29.6 (L) 30.0 - 36.0 g/dL   RDW 85.4 88.4 - 84.4 %   Platelets 288 150 - 400 K/uL   nRBC 0.0 0.0 - 0.2 %   Neutrophils Relative % 69 %   Neutro Abs 6.3 1.7 - 7.7 K/uL   Lymphocytes Relative 18 %   Lymphs Abs 1.6 0.7 - 4.0 K/uL   Monocytes Relative 10 %   Monocytes Absolute 0.9 0.1 - 1.0 K/uL   Eosinophils Relative 1 %   Eosinophils Absolute 0.1 0.0 - 0.5 K/uL   Basophils Relative 1 %   Basophils Absolute 0.1 0.0 - 0.1 K/uL   Immature Granulocytes 1 %   Abs Immature Granulocytes 0.05 0.00 -  0.07 K/uL  Resp panel by RT-PCR (RSV, Flu A&B, Covid) Anterior Nasal Swab     Status: None   Collection Time: 02/01/24  4:03 PM   Specimen: Anterior Nasal Swab  Result Value Ref Range   SARS Coronavirus 2 by RT PCR NEGATIVE NEGATIVE   Influenza A by PCR NEGATIVE NEGATIVE   Influenza B by PCR NEGATIVE NEGATIVE  Resp Syncytial Virus by PCR NEGATIVE NEGATIVE  Troponin I (High Sensitivity)     Status: None   Collection Time: 02/01/24  6:00 PM  Result Value Ref Range   Troponin I (High Sensitivity) 5 <18 ng/L  I-Stat Lactic Acid     Status: None   Collection Time: 02/01/24  6:10 PM  Result Value Ref Range   Lactic Acid, Venous 1.1 0.5 - 1.9 mmol/L  CBG monitoring, ED     Status: Abnormal   Collection Time: 02/02/24 12:08 AM  Result Value Ref Range   Glucose-Capillary 157 (H) 70 - 99 mg/dL   Comment 1 Document in Chart   C Difficile Quick Screen w PCR reflex     Status: None   Collection Time: 02/02/24 12:33 AM   Specimen: STOOL  Result Value Ref Range   C Diff antigen NEGATIVE NEGATIVE   C Diff toxin NEGATIVE NEGATIVE   C Diff interpretation No C. difficile detected.   Basic metabolic panel     Status: Abnormal   Collection Time: 02/02/24  2:00 AM  Result Value Ref Range   Sodium 136 135 - 145 mmol/L   Potassium 3.2 (L) 3.5 - 5.1 mmol/L   Chloride 103 98 - 111 mmol/L   CO2 25 22 - 32 mmol/L   Glucose, Bld 149 (H) 70 - 99 mg/dL   BUN 10 8 - 23 mg/dL   Creatinine, Ser 9.36 0.61 - 1.24 mg/dL   Calcium  8.2 (L) 8.9 - 10.3 mg/dL   GFR, Estimated >39 >39 mL/min   Anion gap 8 5 - 15  Magnesium      Status: None   Collection Time: 02/02/24  2:00 AM  Result Value Ref Range   Magnesium  1.7 1.7 - 2.4 mg/dL  CBG monitoring, ED     Status: Abnormal   Collection Time: 02/02/24  3:55 AM  Result Value Ref Range   Glucose-Capillary 102 (H) 70 - 99 mg/dL  CBC     Status: Abnormal   Collection Time: 02/02/24  5:31 AM  Result Value Ref Range   WBC 10.8 (H) 4.0 - 10.5 K/uL   RBC 5.07 4.22  - 5.81 MIL/uL   Hemoglobin 13.2 13.0 - 17.0 g/dL   HCT 58.0 60.9 - 47.9 %   MCV 82.6 80.0 - 100.0 fL   MCH 26.0 26.0 - 34.0 pg   MCHC 31.5 30.0 - 36.0 g/dL   RDW 85.3 88.4 - 84.4 %   Platelets 337 150 - 400 K/uL   nRBC 0.0 0.0 - 0.2 %  Heparin  level (unfractionated)     Status: Abnormal   Collection Time: 02/02/24  5:35 AM  Result Value Ref Range   Heparin  Unfractionated 0.89 (H) 0.30 - 0.70 IU/mL  Heparin  level (unfractionated)     Status: None   Collection Time: 02/02/24  6:46 AM  Result Value Ref Range   Heparin  Unfractionated 0.33 0.30 - 0.70 IU/mL  Urinalysis, Routine w reflex microscopic -Urine, Clean Catch     Status: Abnormal   Collection Time: 02/02/24  6:50 AM  Result Value Ref Range   Color, Urine YELLOW YELLOW   APPearance CLEAR CLEAR   Specific Gravity, Urine >1.046 (H) 1.005 - 1.030   pH 6.0 5.0 - 8.0   Glucose, UA >=500 (A) NEGATIVE mg/dL   Hgb urine dipstick NEGATIVE NEGATIVE   Bilirubin Urine NEGATIVE NEGATIVE   Ketones, ur 5 (A) NEGATIVE mg/dL   Protein, ur NEGATIVE NEGATIVE mg/dL   Nitrite NEGATIVE NEGATIVE  Leukocytes,Ua NEGATIVE NEGATIVE   RBC / HPF 0-5 0 - 5 RBC/hpf   WBC, UA 0-5 0 - 5 WBC/hpf   Bacteria, UA NONE SEEN NONE SEEN   Squamous Epithelial / HPF 0-5 0 - 5 /HPF   Hyaline Casts, UA PRESENT    Sperm, UA PRESENT   CBG monitoring, ED     Status: Abnormal   Collection Time: 02/02/24  7:46 AM  Result Value Ref Range   Glucose-Capillary 142 (H) 70 - 99 mg/dL  CBG monitoring, ED     Status: Abnormal   Collection Time: 02/02/24 12:02 PM  Result Value Ref Range   Glucose-Capillary 221 (H) 70 - 99 mg/dL  Glucose, capillary     Status: Abnormal   Collection Time: 02/02/24 12:53 PM  Result Value Ref Range   Glucose-Capillary 187 (H) 70 - 99 mg/dL    I have reviewed pertinent nursing notes, vitals, labs, and images as necessary. I have ordered labwork to follow up on as indicated.  I have reviewed the last notes from staff over past 24 hours. I  have discussed patient's care plan and test results with nursing staff, CM/SW, and other staff as appropriate.  Time spent: Greater than 50% of the 55 minute visit was spent in counseling/coordination of care for the patient as laid out in the A&P.   LOS: 1 day   Alm Apo, MD [facility] 02/02/2024, 3:31 PM

## 2024-02-02 NOTE — Assessment & Plan Note (Signed)
Continue CPAP at bedtime 

## 2024-02-02 NOTE — Assessment & Plan Note (Addendum)
-   CTA chest negative for PE - Some suspected noncompliance with meds, unclear reasons; he went to SNF so unclear why Eliquis  was not continued at previous discharge - Previously discharged on Lopressor  and Cardizem  - Cardiology again following, appreciate assistance -Will need to ensure medication compliance at discharge and ability to administer effectively.  If going back to SNF, this should be easier to accomplish at least in the short-term; may still need home health RN upon discharge from SNF to ensure compliance - s/p TEE DCCV on 8/18 with conversion to NSR

## 2024-02-02 NOTE — Assessment & Plan Note (Signed)
-   Continue Flomax

## 2024-02-02 NOTE — Progress Notes (Signed)
   02/02/24 2250  BiPAP/CPAP/SIPAP  $ Non-Invasive Home Ventilator  Initial  $ Face Mask Medium Yes  BiPAP/CPAP/SIPAP Pt Type Adult  BiPAP/CPAP/SIPAP Resmed  Mask Type Full face mask  Dentures removed? Not applicable  Mask Size Medium  Respiratory Rate 18 breaths/min  EPAP 14 cmH2O  FiO2 (%) 21 %  Patient Home Machine No  Patient Home Mask No  Patient Home Tubing No  Auto Titrate No  Nasal massage performed Yes  CPAP/SIPAP surface wiped down Yes  Device Plugged into RED Power Outlet Yes  BiPAP/CPAP /SiPAP Vitals  Pulse Rate 73  Resp 18  SpO2 93 %

## 2024-02-03 DIAGNOSIS — I4892 Unspecified atrial flutter: Secondary | ICD-10-CM | POA: Diagnosis not present

## 2024-02-03 LAB — GLUCOSE, CAPILLARY
Glucose-Capillary: 150 mg/dL — ABNORMAL HIGH (ref 70–99)
Glucose-Capillary: 151 mg/dL — ABNORMAL HIGH (ref 70–99)
Glucose-Capillary: 174 mg/dL — ABNORMAL HIGH (ref 70–99)
Glucose-Capillary: 163 mg/dL — ABNORMAL HIGH (ref 70–99)
Glucose-Capillary: 146 mg/dL — ABNORMAL HIGH (ref 70–99)

## 2024-02-03 MED ORDER — ACETAMINOPHEN 325 MG PO TABS
650.0000 mg | ORAL_TABLET | ORAL | Status: DC | PRN
  Administered 2024-02-03 – 2024-02-04 (×3): 650 mg via ORAL
  Filled 2024-02-03 (×3): qty 2

## 2024-02-03 MED ORDER — INSULIN ASPART 100 UNIT/ML IJ SOLN
0.0000 [IU] | Freq: Three times a day (TID) | INTRAMUSCULAR | Status: DC
  Administered 2024-02-03: 1 [IU] via SUBCUTANEOUS
  Administered 2024-02-03 – 2024-02-04 (×4): 2 [IU] via SUBCUTANEOUS
  Administered 2024-02-04: 1 [IU] via SUBCUTANEOUS
  Administered 2024-02-05: 2 [IU] via SUBCUTANEOUS
  Administered 2024-02-05: 1 [IU] via SUBCUTANEOUS
  Administered 2024-02-05 (×2): 2 [IU] via SUBCUTANEOUS
  Administered 2024-02-06: 1 [IU] via SUBCUTANEOUS
  Administered 2024-02-06: 2 [IU] via SUBCUTANEOUS

## 2024-02-03 NOTE — Progress Notes (Signed)
   02/03/24 2225  BiPAP/CPAP/SIPAP  Reason BIPAP/CPAP not in use Non-compliant (PATIENT STATED HE WAS NOT GOING TO WEAR TONIGHT.)

## 2024-02-03 NOTE — Progress Notes (Addendum)
 Progress Note  Patient Name: Alan Lin Date of Encounter: 02/03/2024  Primary Cardiologist: None   Subjective   Today, he reports that he feels good. He has no awareness of his high heart rates.  Inpatient Medications    Scheduled Meds:  apixaban   5 mg Oral BID   atorvastatin   10 mg Oral Daily   docusate sodium   100 mg Oral Daily   DULoxetine   60 mg Oral Daily   gabapentin   300 mg Oral Daily   And   gabapentin   1,500 mg Oral QHS   insulin  aspart  0-9 Units Subcutaneous Q4H   metoprolol  tartrate  100 mg Oral BID   senna  1 tablet Oral Daily   tamsulosin   0.4 mg Oral Daily   traZODone   50 mg Oral QHS   Continuous Infusions:  diltiazem  (CARDIZEM ) infusion 15 mg/hr (02/03/24 0558)   PRN Meds:    Vital Signs    Vitals:   02/02/24 2050 02/02/24 2250 02/03/24 0003 02/03/24 0444  BP: 118/75  106/61 103/82  Pulse: (!) 106 73 (!) 107   Resp: 20 18 20 20   Temp: 99.8 F (37.7 C) 98.9 F (37.2 C) 98 F (36.7 C) (!) 97.3 F (36.3 C)  TempSrc: Oral Oral  Oral  SpO2: 94% 93% 96% 95%  Weight:      Height:        Intake/Output Summary (Last 24 hours) at 02/03/2024 0803 Last data filed at 02/03/2024 0404 Gross per 24 hour  Intake 918.55 ml  Output --  Net 918.55 ml   Filed Weights   02/02/24 1302  Weight: 108.9 kg    Telemetry    Atrial flutter. Rates largely fixed at about 140 bpm. - Personally Reviewed  ECG    2:1 atrial flutter - Personally Reviewed  Physical Exam   GEN: No acute distress.   Cardiac: Tachy, regular rhythm, no murmurs, rubs, or gallops.  Respiratory: Clear to auscultation bilaterally. Neuro: somnolent but arousable; slow to answer questions, falls asleep MS: No edema; No deformity. Psych: Normal affect   Labs    Chemistry Recent Labs  Lab 02/01/24 1535 02/02/24 0200  NA 138 136  K 3.6 3.2*  CL 105 103  CO2 22 25  GLUCOSE 125* 149*  BUN 9 10  CREATININE 0.57* 0.63  CALCIUM  9.0 8.2*  GFRNONAA >60 >60  ANIONGAP 11 8      Hematology Recent Labs  Lab 02/01/24 1535 02/02/24 0531  WBC 9.1 10.8*  RBC 5.60 5.07  HGB 14.1 13.2  HCT 47.6 41.9  MCV 85.0 82.6  MCH 25.2* 26.0  MCHC 29.6* 31.5  RDW 14.5 14.6  PLT 288 337    Cardiac EnzymesNo results for input(s): TROPONINI in the last 168 hours. No results for input(s): TROPIPOC in the last 168 hours.   BNP Recent Labs  Lab 02/01/24 1535  BNP 31.9     DDimer No results for input(s): DDIMER in the last 168 hours.   Summary of Pertinent studies    TTE: 01/13/24 -- EF 70-75%, moderate pericardial effusion without evidence of tamponade  Cardiac cath: 2019 - nonobstructive disease   Labs: reviewed  Patient Profile     68 y.o. male with a history of hypertension, hyperlipidemia, PAF, nonobstructive coronary disease by heart catheterization in 2019, diabetes, morbid obesity, normal pressure hydrocephalus, frequent falls, OSA on CPAP admitted with atrial flutter with rapid ventricular rates.  Assessment & Plan    Atrial flutter Fixed 2:1 conduction It  may be difficult to achieve rate control Pt is scheduled for TEE cardioversion monday  Secondary hypercoagulable state CHA2DS2-VASc score is 4 Now in agreement to take anticoagulation Continue eliquis  5mg  PO BID  CAD Non-obstructive CAD by cath in 2019 No anginal symptoms No ASA due to DOAC Continue atorvastatin  10mg        For questions or updates, please contact [facility] Please consult [email or web address] for contact info under Cardiology/STEMI.      Signed, Eulas FORBES F[place], MD 02/03/2024, 8:03 AM

## 2024-02-03 NOTE — Progress Notes (Signed)
 Progress Note    Alan Lin   FMW:[id number]  DOB: [birth year]  DOA: 02/01/2024     2 PCP: Agustina Greig NOVAK, MD  Initial CC: elevated HR  Hospital Course: Alan Lin is a 68 year old male with PMH PAF, PSVT, nonobstructive CAD, HTN, HLD, osteoarthritis, DM II, peripheral neuropathy, BPH, cervical/lumbar DDD, OSA on CPAP, depression/anxiety. Recent hospital admission 7/26-8/1 for persistent atrial flutter with RVR.  He was previously not on anticoagulation due to his fall risk but decision was made to initiate anticoagulation during that hospitalization.  Cardiology was consulted too and he was discharged on Eliquis , Cardizem  360 mg daily, and metoprolol  100 mg twice daily.   Now presents for this hospitalization from [facility] for evaluation of tachycardia and some heart palpitations during physical therapy evaluation. He was noted to be in a flutter with RVR, rate 140s on arrival. Appears that he has not been on Eliquis  since discharge from prior hospitalization; reasons unknown. He was started on a Cardizem  drip and admitted for cardiology evaluation as well.  Interval History:  No events overnight.  Denies any chest pain or shortness of breath.  No palpitations.  Remains rhythm unaware.  Still in A-fib with mild RVR heart rate in the low 100s.   Assessment and Plan: * Atrial flutter with rapid ventricular response (HCC) - CTA chest negative for PE - Some suspected noncompliance with meds, unclear reasons; he went to SNF so unclear why Eliquis  was not continued at previous discharge - Previously discharged on Lopressor  and Cardizem  - Cardiology again following, appreciate assistance - Due to gap in Eliquis , tentative plan is for TEE with cardioversion on Monday -Will need to ensure medication compliance at discharge and ability to administer effectively.  If going back to SNF, this should be easier to accomplish at least in the short-term; may still need home health RN upon  discharge from SNF to ensure compliance  Diabetic neuropathy (HCC) - Continue gabapentin  and Cymbalta   Depressive disorder Continue Cymbalta  and trazodone   BPH (benign prostatic hyperplasia) - Continue Flomax   OSA (obstructive sleep apnea) - Continue CPAP at bedtime  Essential hypertension - Follow-up for final discharge plans - Previously on metoprolol  and Cardizem   Hyperlipidemia - On Lipitor at home  Anxiety - Continue Cymbalta  and trazodone   Type 2 diabetes mellitus (HCC) - On Jardiance  and semaglutide  - SSI while inpatient - Last A1c 6.7% on 01/18/2024    Old records reviewed in assessment of this patient  Antimicrobials:   DVT prophylaxis:   apixaban  (ELIQUIS ) tablet 5 mg   Code Status:   Code Status: Full Code  Mobility Assessment (Last 72 Hours)     Mobility Assessment     Row Name 02/03/24 0735 02/02/24 2100 02/02/24 1249       Does the patient have exclusion criteria? Yes - Bedfast (Level 1) - Select exclusion criteria in next row Yes - Bedfast (Level 1) - Select exclusion criteria in next row Yes - Bedfast (Level 1) - Select exclusion criteria in next row     Mobility Assessment Exclusion Criteria Order for bedrest (clairify order) -- Order for bedrest (clairify order)     What is the highest level of mobility based on the mobility assessment? -- Level 2 (Chairfast) - Balance while sitting on edge of bed and cannot stand --     Is the above level different from baseline mobility prior to current illness? -- Yes - Recommend PT order --        Barriers  to discharge: none Disposition Plan:  SNF HH orders placed: TBD Status is: Inpt  Objective: Blood pressure 118/78, pulse 91, temperature 97.8 F (36.6 C), temperature source Oral, resp. rate 19, height 5' 10 (1.778 m), weight 108.9 kg, SpO2 94%.  Examination:  Physical Exam Constitutional:      General: He is not in acute distress.    Appearance: Normal appearance.  HENT:     Head:  Normocephalic and atraumatic.     Mouth/Throat:     Mouth: Mucous membranes are moist.  Eyes:     Extraocular Movements: Extraocular movements intact.  Cardiovascular:     Rate and Rhythm: Tachycardia present. Rhythm irregular.  Pulmonary:     Effort: Pulmonary effort is normal. No respiratory distress.     Breath sounds: Normal breath sounds. No wheezing.  Abdominal:     General: Bowel sounds are normal. There is no distension.     Palpations: Abdomen is soft.     Tenderness: There is no abdominal tenderness.  Musculoskeletal:        General: Normal range of motion.     Cervical back: Normal range of motion and neck supple.  Skin:    General: Skin is warm and dry.  Neurological:     General: No focal deficit present.     Mental Status: He is alert.  Psychiatric:        Mood and Affect: Mood normal.        Behavior: Behavior normal.      Consultants:  Cardiology  Procedures:    Data Reviewed: Results for orders placed or performed during the hospital encounter of 02/01/24 (from the past 24 hours)  Glucose, capillary     Status: Abnormal   Collection Time: 02/02/24  4:15 PM  Result Value Ref Range   Glucose-Capillary 172 (H) 70 - 99 mg/dL  Glucose, capillary     Status: Abnormal   Collection Time: 02/02/24  8:47 PM  Result Value Ref Range   Glucose-Capillary 154 (H) 70 - 99 mg/dL  Glucose, capillary     Status: Abnormal   Collection Time: 02/02/24 11:59 PM  Result Value Ref Range   Glucose-Capillary 153 (H) 70 - 99 mg/dL  Glucose, capillary     Status: Abnormal   Collection Time: 02/03/24  4:39 AM  Result Value Ref Range   Glucose-Capillary 150 (H) 70 - 99 mg/dL  Glucose, capillary     Status: Abnormal   Collection Time: 02/03/24  7:22 AM  Result Value Ref Range   Glucose-Capillary 151 (H) 70 - 99 mg/dL  Glucose, capillary     Status: Abnormal   Collection Time: 02/03/24 12:01 PM  Result Value Ref Range   Glucose-Capillary 174 (H) 70 - 99 mg/dL    I have  reviewed pertinent nursing notes, vitals, labs, and images as necessary. I have ordered labwork to follow up on as indicated.  I have reviewed the last notes from staff over past 24 hours. I have discussed patient's care plan and test results with nursing staff, CM/SW, and other staff as appropriate.  Time spent: Greater than 50% of the 55 minute visit was spent in counseling/coordination of care for the patient as laid out in the A&P.   LOS: 2 days   Alm Apo, MD [facility] 02/03/2024, 2:07 PM

## 2024-02-03 NOTE — Plan of Care (Signed)

## 2024-02-04 DIAGNOSIS — I4892 Unspecified atrial flutter: Secondary | ICD-10-CM | POA: Diagnosis not present

## 2024-02-04 LAB — BASIC METABOLIC PANEL WITH GFR
Anion gap: 10 (ref 5–15)
BUN: 12 mg/dL (ref 8–23)
CO2: 23 mmol/L (ref 22–32)
Calcium: 8.8 mg/dL — ABNORMAL LOW (ref 8.9–10.3)
Chloride: 104 mmol/L (ref 98–111)
Creatinine, Ser: 0.59 mg/dL — ABNORMAL LOW (ref 0.61–1.24)
GFR, Estimated: >60 mL/min (ref >60)
Glucose, Bld: 148 mg/dL — ABNORMAL HIGH (ref 70–99)
Potassium: 3.2 mmol/L — ABNORMAL LOW (ref 3.5–5.1)
Sodium: 137 mmol/L (ref 135–145)

## 2024-02-04 LAB — LIPID PANEL
Cholesterol: 93 mg/dL (ref 0–200)
HDL: 26 mg/dL — ABNORMAL LOW (ref >40)
LDL Cholesterol: 49 mg/dL (ref 0–99)
Total CHOL/HDL Ratio: 3.6 ratio
Triglycerides: 90 mg/dL (ref <150)
VLDL: 18 mg/dL (ref 0–40)

## 2024-02-04 LAB — GLUCOSE, CAPILLARY
Glucose-Capillary: 155 mg/dL — ABNORMAL HIGH (ref 70–99)
Glucose-Capillary: 151 mg/dL — ABNORMAL HIGH (ref 70–99)
Glucose-Capillary: 162 mg/dL — ABNORMAL HIGH (ref 70–99)
Glucose-Capillary: 136 mg/dL — ABNORMAL HIGH (ref 70–99)

## 2024-02-04 LAB — MAGNESIUM: Magnesium: 1.6 mg/dL — ABNORMAL LOW (ref 1.7–2.4)

## 2024-02-04 MED ORDER — MAGNESIUM SULFATE 2 GM/50ML IV SOLN
2.0000 g | Freq: Once | INTRAVENOUS | Status: AC
  Administered 2024-02-04: 2 g via INTRAVENOUS
  Filled 2024-02-04: qty 50

## 2024-02-04 MED ORDER — POTASSIUM CHLORIDE CRYS ER 20 MEQ PO TBCR
40.0000 meq | EXTENDED_RELEASE_TABLET | Freq: Once | ORAL | Status: AC
  Administered 2024-02-04: 40 meq via ORAL
  Filled 2024-02-04: qty 2

## 2024-02-04 NOTE — Plan of Care (Signed)

## 2024-02-04 NOTE — Progress Notes (Signed)
 Progress Note  Patient Name: Alan Lin Date of Encounter: 02/04/2024  Primary Cardiologist: None   Subjective   Today, he reports that he feels good. He has no awareness of his high heart rates.  Inpatient Medications    Scheduled Meds:  apixaban   5 mg Oral BID   atorvastatin   10 mg Oral Daily   docusate sodium   100 mg Oral Daily   DULoxetine   60 mg Oral Daily   gabapentin   300 mg Oral Daily   And   gabapentin   1,500 mg Oral QHS   insulin  aspart  0-9 Units Subcutaneous TID AC & HS   metoprolol  tartrate  100 mg Oral BID   potassium chloride   40 mEq Oral Once   senna  1 tablet Oral Daily   tamsulosin   0.4 mg Oral Daily   traZODone   50 mg Oral QHS   Continuous Infusions:  diltiazem  (CARDIZEM ) infusion 7.5 mg/hr (02/04/24 0134)   magnesium  sulfate bolus IVPB     PRN Meds: acetaminophen    Vital Signs    Vitals:   02/03/24 0444 02/03/24 1340 02/03/24 2045 02/04/24 0444  BP: 103/82 118/78 100/74 104/69  Pulse:  91 64 67  Resp: 20 19 16 15   Temp: (!) 97.3 F (36.3 C) 97.8 F (36.6 C) 98.6 F (37 C) 100 F (37.8 C)  TempSrc: Oral Oral Oral Oral  SpO2: 95% 94% 94% 95%  Weight:      Height:        Intake/Output Summary (Last 24 hours) at 02/04/2024 0746 Last data filed at 02/04/2024 0716 Gross per 24 hour  Intake 732.52 ml  Output 1750 ml  Net -1017.48 ml   Filed Weights   02/02/24 1302  Weight: 108.9 kg    Telemetry    Atrial flutter. Rates largely fixed at about 140 bpm. - Personally Reviewed  ECG    2:1 atrial flutter - Personally Reviewed  Physical Exam   GEN: No acute distress.   Cardiac: Tachy, regular rhythm, no murmurs, rubs, or gallops.  Respiratory: Clear to auscultation bilaterally. Neuro: somnolent but arousable; slow to answer questions, falls asleep MS: No edema; No deformity. Psych: Normal affect   Labs    Chemistry Recent Labs  Lab 02/01/24 1535 02/02/24 0200 02/04/24 0520  NA 138 136 137  K 3.6 3.2* 3.2*  CL 105  103 104  CO2 22 25 23   GLUCOSE 125* 149* 148*  BUN 9 10 12   CREATININE 0.57* 0.63 0.59*  CALCIUM  9.0 8.2* 8.8*  GFRNONAA >60 >60 >60  ANIONGAP 11 8 10      Hematology Recent Labs  Lab 02/01/24 1535 02/02/24 0531  WBC 9.1 10.8*  RBC 5.60 5.07  HGB 14.1 13.2  HCT 47.6 41.9  MCV 85.0 82.6  MCH 25.2* 26.0  MCHC 29.6* 31.5  RDW 14.5 14.6  PLT 288 337    Cardiac EnzymesNo results for input(s): TROPONINI in the last 168 hours. No results for input(s): TROPIPOC in the last 168 hours.   BNP Recent Labs  Lab 02/01/24 1535  BNP 31.9     DDimer No results for input(s): DDIMER in the last 168 hours.   Summary of Pertinent studies    TTE: 01/13/24 -- EF 70-75%, moderate pericardial effusion without evidence of tamponade  Cardiac cath: 2019 - nonobstructive disease   Labs: reviewed  Patient Profile     68 y.o. male with a history of hypertension, hyperlipidemia, PAF, nonobstructive coronary disease by heart catheterization in 2019, diabetes,  morbid obesity, normal pressure hydrocephalus, frequent falls, OSA on CPAP admitted with atrial flutter with rapid ventricular rates.  Assessment & Plan    Atrial flutter Fixed 2:1 conduction It may be difficult to achieve rate control Pt is scheduled for TEE cardioversion Tomorrow NPO after midnight  Secondary hypercoagulable state CHA2DS2-VASc score is 4 Now in agreement to take anticoagulation Continue eliquis  5mg  PO BID  CAD Non-obstructive CAD by cath in 2019 No anginal symptoms No ASA due to DOAC On atorvastatin  10mg  Will check FLP tomorrow      For questions or updates, please contact [facility] Please consult [email or web address] for contact info under Cardiology/STEMI.      Signed, Eulas FORBES Furbish, MD 02/04/2024, 7:46 AM

## 2024-02-04 NOTE — Progress Notes (Signed)
 Progress Note    Alan Lin   FMW:[id number]  DOB: [birth year]  DOA: 02/01/2024     3 PCP: Agustina Greig NOVAK, MD  Initial CC: elevated HR  Hospital Course: Alan Lin is a 68 year old male with PMH PAF, PSVT, nonobstructive CAD, HTN, HLD, osteoarthritis, DM II, peripheral neuropathy, BPH, cervical/lumbar DDD, OSA on CPAP, depression/anxiety. Recent hospital admission 7/26-8/1 for persistent atrial flutter with RVR.  He was previously not on anticoagulation due to his fall risk but decision was made to initiate anticoagulation during that hospitalization.  Cardiology was consulted too and he was discharged on Eliquis , Cardizem  360 mg daily, and metoprolol  100 mg twice daily.   Now presents for this hospitalization from [facility] for evaluation of tachycardia and some heart palpitations during physical therapy evaluation. He was noted to be in a flutter with RVR, rate 140s on arrival. Appears that he has not been on Eliquis  since discharge from prior hospitalization; reasons unknown. He was started on a Cardizem  drip and admitted for cardiology evaluation as well.  Interval History:  Still feeling okay.  No issues overnight.  Remains rhythm unaware.  Denies any chest pain.  Assessment and Plan: * Atrial flutter with rapid ventricular response (HCC) - CTA chest negative for PE - Some suspected noncompliance with meds, unclear reasons; he went to SNF so unclear why Eliquis  was not continued at previous discharge - Previously discharged on Lopressor  and Cardizem  - Cardiology again following, appreciate assistance - Due to gap in Eliquis , tentative plan is for TEE with cardioversion on Monday -Will need to ensure medication compliance at discharge and ability to administer effectively.  If going back to SNF, this should be easier to accomplish at least in the short-term; may still need home health RN upon discharge from SNF to ensure compliance  Diabetic neuropathy (HCC) - Continue  gabapentin  and Cymbalta   Depressive disorder Continue Cymbalta  and trazodone   BPH (benign prostatic hyperplasia) - Continue Flomax   OSA (obstructive sleep apnea) - Continue CPAP at bedtime  Essential hypertension - Follow-up for final discharge plans - Previously on metoprolol  and Cardizem   Hyperlipidemia - On Lipitor at home  Anxiety - Continue Cymbalta  and trazodone   Type 2 diabetes mellitus (HCC) - On Jardiance  and semaglutide  - SSI while inpatient - Last A1c 6.7% on 01/18/2024    Old records reviewed in assessment of this patient  Antimicrobials:   DVT prophylaxis:   apixaban  (ELIQUIS ) tablet 5 mg   Code Status:   Code Status: Full Code  Mobility Assessment (Last 72 Hours)     Mobility Assessment     Row Name 02/04/24 0836 02/04/24 0835 02/03/24 2100 02/03/24 0735 02/02/24 2100   Does the patient have exclusion criteria? Yes - Bedfast (Level 1) - Select exclusion criteria in next row Yes - Bedfast (Level 1) - Select exclusion criteria in next row Yes - Bedfast (Level 1) - Select exclusion criteria in next row Yes - Bedfast (Level 1) - Select exclusion criteria in next row Yes - Bedfast (Level 1) - Select exclusion criteria in next row   Mobility Assessment Exclusion Criteria -- Order for bedrest (clairify order) -- Order for bedrest (clairify order) --   What is the highest level of mobility based on the mobility assessment? -- -- Level 2 (Chairfast) - Balance while sitting on edge of bed and cannot stand -- Level 2 (Chairfast) - Balance while sitting on edge of bed and cannot stand   Is the above level different from baseline mobility prior  to current illness? -- -- Yes - Recommend PT order -- Yes - Recommend PT order    Row Name 02/02/24 1249           Does the patient have exclusion criteria? Yes - Bedfast (Level 1) - Select exclusion criteria in next row       Mobility Assessment Exclusion Criteria Order for bedrest (clairify order)          Barriers  to discharge: none Disposition Plan:  SNF HH orders placed: TBD Status is: Inpt  Objective: Blood pressure 104/69, pulse 67, temperature 100 F (37.8 C), temperature source Oral, resp. rate 15, height 5' 10 (1.778 m), weight 108.9 kg, SpO2 95%.  Examination:  Physical Exam Constitutional:      General: He is not in acute distress.    Appearance: Normal appearance.  HENT:     Head: Normocephalic and atraumatic.     Mouth/Throat:     Mouth: Mucous membranes are moist.  Eyes:     Extraocular Movements: Extraocular movements intact.  Cardiovascular:     Rate and Rhythm: Tachycardia present. Rhythm irregular.  Pulmonary:     Effort: Pulmonary effort is normal. No respiratory distress.     Breath sounds: Normal breath sounds. No wheezing.  Abdominal:     General: Bowel sounds are normal. There is no distension.     Palpations: Abdomen is soft.     Tenderness: There is no abdominal tenderness.  Musculoskeletal:        General: Normal range of motion.     Cervical back: Normal range of motion and neck supple.  Skin:    General: Skin is warm and dry.  Neurological:     General: No focal deficit present.     Mental Status: He is alert.  Psychiatric:        Mood and Affect: Mood normal.        Behavior: Behavior normal.      Consultants:  Cardiology  Procedures:    Data Reviewed: Results for orders placed or performed during the hospital encounter of 02/01/24 (from the past 24 hours)  Glucose, capillary     Status: Abnormal   Collection Time: 02/03/24  4:07 PM  Result Value Ref Range   Glucose-Capillary 163 (H) 70 - 99 mg/dL  Glucose, capillary     Status: Abnormal   Collection Time: 02/03/24  9:43 PM  Result Value Ref Range   Glucose-Capillary 146 (H) 70 - 99 mg/dL  Basic metabolic panel with GFR     Status: Abnormal   Collection Time: 02/04/24  5:20 AM  Result Value Ref Range   Sodium 137 135 - 145 mmol/L   Potassium 3.2 (L) 3.5 - 5.1 mmol/L   Chloride 104 98 -  111 mmol/L   CO2 23 22 - 32 mmol/L   Glucose, Bld 148 (H) 70 - 99 mg/dL   BUN 12 8 - 23 mg/dL   Creatinine, Ser 9.40 (L) 0.61 - 1.24 mg/dL   Calcium  8.8 (L) 8.9 - 10.3 mg/dL   GFR, Estimated >39 >39 mL/min   Anion gap 10 5 - 15  Magnesium      Status: Abnormal   Collection Time: 02/04/24  5:20 AM  Result Value Ref Range   Magnesium  1.6 (L) 1.7 - 2.4 mg/dL  Lipid panel     Status: Abnormal   Collection Time: 02/04/24  5:20 AM  Result Value Ref Range   Cholesterol 93 0 - 200 mg/dL   Triglycerides 90 <  150 mg/dL   HDL 26 (L) >59 mg/dL   Total CHOL/HDL Ratio 3.6 RATIO   VLDL 18 0 - 40 mg/dL   LDL Cholesterol 49 0 - 99 mg/dL  Glucose, capillary     Status: Abnormal   Collection Time: 02/04/24  7:51 AM  Result Value Ref Range   Glucose-Capillary 155 (H) 70 - 99 mg/dL  Glucose, capillary     Status: Abnormal   Collection Time: 02/04/24 12:10 PM  Result Value Ref Range   Glucose-Capillary 151 (H) 70 - 99 mg/dL    I have reviewed pertinent nursing notes, vitals, labs, and images as necessary. I have ordered labwork to follow up on as indicated.  I have reviewed the last notes from staff over past 24 hours. I have discussed patient's care plan and test results with nursing staff, CM/SW, and other staff as appropriate.  Time spent: Greater than 50% of the 55 minute visit was spent in counseling/coordination of care for the patient as laid out in the A&P.   LOS: 3 days   Alm Apo, MD [facility] 02/04/2024, 1:08 PM

## 2024-02-04 NOTE — H&P (View-Only) (Signed)
 Progress Note  Patient Name: Alan Lin Date of Encounter: 02/04/2024  Primary Cardiologist: None   Subjective   Today, he reports that he feels good. He has no awareness of his high heart rates.  Inpatient Medications    Scheduled Meds:  apixaban   5 mg Oral BID   atorvastatin   10 mg Oral Daily   docusate sodium   100 mg Oral Daily   DULoxetine   60 mg Oral Daily   gabapentin   300 mg Oral Daily   And   gabapentin   1,500 mg Oral QHS   insulin  aspart  0-9 Units Subcutaneous TID AC & HS   metoprolol  tartrate  100 mg Oral BID   potassium chloride   40 mEq Oral Once   senna  1 tablet Oral Daily   tamsulosin   0.4 mg Oral Daily   traZODone   50 mg Oral QHS   Continuous Infusions:  diltiazem  (CARDIZEM ) infusion 7.5 mg/hr (02/04/24 0134)   magnesium  sulfate bolus IVPB     PRN Meds: acetaminophen    Vital Signs    Vitals:   02/03/24 0444 02/03/24 1340 02/03/24 2045 02/04/24 0444  BP: 103/82 118/78 100/74 104/69  Pulse:  91 64 67  Resp: 20 19 16 15   Temp: (!) 97.3 F (36.3 C) 97.8 F (36.6 C) 98.6 F (37 C) 100 F (37.8 C)  TempSrc: Oral Oral Oral Oral  SpO2: 95% 94% 94% 95%  Weight:      Height:        Intake/Output Summary (Last 24 hours) at 02/04/2024 0746 Last data filed at 02/04/2024 0716 Gross per 24 hour  Intake 732.52 ml  Output 1750 ml  Net -1017.48 ml   Filed Weights   02/02/24 1302  Weight: 108.9 kg    Telemetry    Atrial flutter. Rates largely fixed at about 140 bpm. - Personally Reviewed  ECG    2:1 atrial flutter - Personally Reviewed  Physical Exam   GEN: No acute distress.   Cardiac: Tachy, regular rhythm, no murmurs, rubs, or gallops.  Respiratory: Clear to auscultation bilaterally. Neuro: somnolent but arousable; slow to answer questions, falls asleep MS: No edema; No deformity. Psych: Normal affect   Labs    Chemistry Recent Labs  Lab 02/01/24 1535 02/02/24 0200 02/04/24 0520  NA 138 136 137  K 3.6 3.2* 3.2*  CL 105  103 104  CO2 22 25 23   GLUCOSE 125* 149* 148*  BUN 9 10 12   CREATININE 0.57* 0.63 0.59*  CALCIUM  9.0 8.2* 8.8*  GFRNONAA >60 >60 >60  ANIONGAP 11 8 10      Hematology Recent Labs  Lab 02/01/24 1535 02/02/24 0531  WBC 9.1 10.8*  RBC 5.60 5.07  HGB 14.1 13.2  HCT 47.6 41.9  MCV 85.0 82.6  MCH 25.2* 26.0  MCHC 29.6* 31.5  RDW 14.5 14.6  PLT 288 337    Cardiac EnzymesNo results for input(s): TROPONINI in the last 168 hours. No results for input(s): TROPIPOC in the last 168 hours.   BNP Recent Labs  Lab 02/01/24 1535  BNP 31.9     DDimer No results for input(s): DDIMER in the last 168 hours.   Summary of Pertinent studies    TTE: 01/13/24 -- EF 70-75%, moderate pericardial effusion without evidence of tamponade  Cardiac cath: 2019 - nonobstructive disease   Labs: reviewed  Patient Profile     68 y.o. male with a history of hypertension, hyperlipidemia, PAF, nonobstructive coronary disease by heart catheterization in 2019, diabetes,  morbid obesity, normal pressure hydrocephalus, frequent falls, OSA on CPAP admitted with atrial flutter with rapid ventricular rates.  Assessment & Plan    Atrial flutter Fixed 2:1 conduction It may be difficult to achieve rate control Pt is scheduled for TEE cardioversion Tomorrow NPO after midnight  Secondary hypercoagulable state CHA2DS2-VASc score is 4 Now in agreement to take anticoagulation Continue eliquis  5mg  PO BID  CAD Non-obstructive CAD by cath in 2019 No anginal symptoms No ASA due to DOAC On atorvastatin  10mg  Will check FLP tomorrow      For questions or updates, please contact [facility] Please consult [email or web address] for contact info under Cardiology/STEMI.      Signed, Eulas FORBES Furbish, MD 02/04/2024, 7:46 AM

## 2024-02-05 ENCOUNTER — Inpatient Hospital Stay (HOSPITAL_COMMUNITY): Admitting: Anesthesiology

## 2024-02-05 ENCOUNTER — Encounter (HOSPITAL_COMMUNITY): Payer: Self-pay | Admitting: Cardiovascular Disease

## 2024-02-05 ENCOUNTER — Inpatient Hospital Stay (HOSPITAL_COMMUNITY)

## 2024-02-05 ENCOUNTER — Encounter (HOSPITAL_COMMUNITY)
Admission: EM | Disposition: A | Discharge status: Home Health | Payer: Self-pay | Source: Home / Self Care | Attending: Internal Medicine

## 2024-02-05 DIAGNOSIS — I10 Essential (primary) hypertension: Secondary | ICD-10-CM | POA: Diagnosis not present

## 2024-02-05 DIAGNOSIS — I4892 Unspecified atrial flutter: Secondary | ICD-10-CM | POA: Diagnosis not present

## 2024-02-05 DIAGNOSIS — F418 Other specified anxiety disorders: Secondary | ICD-10-CM | POA: Diagnosis not present

## 2024-02-05 DIAGNOSIS — I4891 Unspecified atrial fibrillation: Secondary | ICD-10-CM | POA: Diagnosis not present

## 2024-02-05 DIAGNOSIS — E119 Type 2 diabetes mellitus without complications: Secondary | ICD-10-CM | POA: Diagnosis not present

## 2024-02-05 HISTORY — PX: TRANSESOPHAGEAL ECHOCARDIOGRAM (CATH LAB): EP1270

## 2024-02-05 HISTORY — PX: CARDIOVERSION: EP1203

## 2024-02-05 LAB — GLUCOSE, CAPILLARY
Glucose-Capillary: 149 mg/dL — ABNORMAL HIGH (ref 70–99)
Glucose-Capillary: 156 mg/dL — ABNORMAL HIGH (ref 70–99)
Glucose-Capillary: 183 mg/dL — ABNORMAL HIGH (ref 70–99)
Glucose-Capillary: 156 mg/dL — ABNORMAL HIGH (ref 70–99)

## 2024-02-05 LAB — BASIC METABOLIC PANEL WITH GFR
Anion gap: 9 (ref 5–15)
BUN: 16 mg/dL (ref 8–23)
CO2: 23 mmol/L (ref 22–32)
Calcium: 8.6 mg/dL — ABNORMAL LOW (ref 8.9–10.3)
Chloride: 102 mmol/L (ref 98–111)
Creatinine, Ser: 0.68 mg/dL (ref 0.61–1.24)
GFR, Estimated: >60 mL/min (ref >60)
Glucose, Bld: 171 mg/dL — ABNORMAL HIGH (ref 70–99)
Potassium: 3.6 mmol/L (ref 3.5–5.1)
Sodium: 134 mmol/L — ABNORMAL LOW (ref 135–145)

## 2024-02-05 LAB — ECHO TEE

## 2024-02-05 LAB — MAGNESIUM: Magnesium: 1.7 mg/dL (ref 1.7–2.4)

## 2024-02-05 SURGERY — TRANSESOPHAGEAL ECHOCARDIOGRAM (TEE) (CATHLAB)
Anesthesia: Monitor Anesthesia Care

## 2024-02-05 MED ORDER — ONDANSETRON HCL 4 MG/2ML IJ SOLN: 4.0000 mg | Freq: Four times a day (QID) | INTRAMUSCULAR | Status: DC | PRN

## 2024-02-05 MED ORDER — SODIUM CHLORIDE 0.9 % IV SOLN: INTRAVENOUS | Status: DC

## 2024-02-05 MED ORDER — APIXABAN 5 MG PO TABS
ORAL_TABLET | ORAL | Status: AC
  Filled 2024-02-05: qty 1

## 2024-02-05 MED ORDER — PROPOFOL 500 MG/50ML IV EMUL
INTRAVENOUS | Status: DC | PRN
  Administered 2024-02-05: 25 mg via INTRAVENOUS
  Administered 2024-02-05: 125 ug/kg/min via INTRAVENOUS
  Administered 2024-02-05: 20 mg via INTRAVENOUS

## 2024-02-05 SURGICAL SUPPLY — 1 items: PAD DEFIB RADIO PHYSIO CONN (PAD) ×1 IMPLANT

## 2024-02-05 NOTE — Evaluation (Signed)
 Physical Therapy Evaluation Patient Details Name: Alan Lin MRN: [id number] DOB: 09-Aug-1955 Today's Date: 02/05/2024  History of Present Illness  Alan Lin is a 68 y.o. male admitted with a flutter with RVR; s/p cardioversion 02/05/24. PMH: osteoarthritis, type 2 diabetes, hyperlipidemia, hypomagnesemia, hypophosphatemia, hypertension, atrial fibrillation, BPH, degeneration of lumbar intervertebral disc, cervical DDD, cervical radiculitis, history of falls, obstructive sleep apnea, anxiety, history of sepsis, history of syncope, paroxysmal atrial fibrillation  Clinical Impression  Alan admitted with above diagnosis. Alan from [facility] [place], reports has private care CNAs 9-1 and 4-8 who assist with all self care tasks, Alan reports remains in bed with diaper on when alone, using RW to ambulate up to 170 ft with Alan, using w/c to mobilize around facility, reports active in facility gym. Alan with varying reports stating he ambulates to restroom with RW independently but later reports he doesn't leave bed unless CNA is present. Alan needing increased time with cues for all mobility tasks, slow to answer questions, decreased initiation. Alan comes to sitting EOB with increased effort and time, min A to scoot forward to bedside. Alan powers to stand with mod A+2 for 5 reps, limited standing tolerance to 10 sec at best. Alan reports R ankle pain limiting, reports fell 2 times in one day in May and sprained it. Alan reports PCA can assist him at this level; recommend return to [facility] [place] with HHPT and PCAs vs SNF if facility unable to provide this level of assistance. Alan currently with functional limitations due to the deficits listed below (see Alan Problem List). Alan will benefit from acute skilled Alan to increase their independence and safety with mobility to allow discharge.           If plan is discharge home, recommend the following: A lot of help with bathing/dressing/bathroom;Assist for  transportation;Two people to help with walking and/or transfers;Assistance with cooking/housework;Help with stairs or ramp for entrance   Can travel by private vehicle   No    Equipment Recommendations None recommended by Alan  Recommendations for Other Services       Functional Status Assessment Patient has had a recent decline in their functional status and demonstrates the ability to make significant improvements in function in a reasonable and predictable amount of time.     Precautions / Restrictions Precautions Precautions: Fall Precaution/Restrictions Comments: monitor HR Restrictions Weight Bearing Restrictions Per Provider Order: No      Mobility  Bed Mobility Overal bed mobility: Needs Assistance Bed Mobility: Supine to Sit     Supine to sit: Min assist, HOB elevated, Used rails     General bed mobility comments: significantly increased time, min A to upright trunk while using bedrails and elevated HOB, slow to scoot to sitting EOB with increased effort    Transfers Overall transfer level: Needs assistance Equipment used: Rolling walker (2 wheels) Transfers: Sit to/from Stand Sit to Stand: Mod assist, +2 physical assistance, From elevated surface           General transfer comment: decreased initiation, mod A+2 to power up to stand for 5 reps from elevated bed, limited standing tolerance to 10 seconds at best, Alan reporting R ankle pain limiting    Ambulation/Gait                  Stairs            Wheelchair Mobility     Tilt Bed    Modified Rankin (Stroke Patients Only)  Balance Overall balance assessment: Needs assistance Sitting-balance support: Feet supported, Bilateral upper extremity supported Sitting balance-Leahy Scale: Fair     Standing balance support: Reliant on assistive device for balance, During functional activity, Bilateral upper extremity supported Standing balance-Leahy Scale: Poor                                Pertinent Vitals/Pain Pain Assessment Pain Assessment: Faces Faces Pain Scale: Hurts even more Pain Location: R ankle Pain Descriptors / Indicators: Sore, Grimacing, Guarding Pain Intervention(s): Limited activity within patient's tolerance, Monitored during session, Repositioned    Home Living Family/patient expects to be discharged to:: Assisted living                 Home Equipment: Agricultural consultant (2 wheels);Wheelchair - manual;Cane - single point;Grab bars - toilet;Grab bars - tub/shower Additional Comments: Occupational psychologist    Prior Function Prior Level of Function : Needs assist       Physical Assist : Mobility (physical);ADLs (physical)     Mobility Comments: Alan with varying reports, states went to rehab after last hospitalization and had progressed to walking up to 170 ft with RW with therapy, propelling self in w/c for facility distances ADLs Comments: Alan reports has CNA 9-1 and 4-8 who assist with all self care, remains in bed when no one present with diaper on     Extremity/Trunk Assessment   Upper Extremity Assessment Upper Extremity Assessment: Defer to OT evaluation    Lower Extremity Assessment Lower Extremity Assessment: Generalized weakness (AROM WFL, strength grossly 3+/5, denies numbness/tingling throughout)       Communication   Communication Communication: No apparent difficulties    Cognition Arousal: Alert Behavior During Therapy: WFL for tasks assessed/performed   Alan - Cognitive impairments: No family/caregiver present to determine baseline                       Alan - Cognition Comments: Alan with decreased intiation, looks around room for current date to report correctly, slow to respond to commands, pleasant Following commands: Impaired Following commands impaired: Follows one step commands with increased time     Cueing Cueing Techniques: Verbal cues, Gestural cues, Tactile cues, Visual cues      General Comments General comments (skin integrity, edema, etc.): HR 101 max noted with mobility, 90s at rest    Exercises     Assessment/Plan    Alan Assessment Patient needs continued Alan services  Alan Problem List Decreased strength;Decreased range of motion;Decreased activity tolerance;Decreased balance;Decreased mobility;Decreased knowledge of use of DME;Decreased safety awareness;Decreased knowledge of precautions;Cardiopulmonary status limiting activity;Pain;Obesity;Decreased cognition       Alan Treatment Interventions DME instruction;Gait training;Functional mobility training;Therapeutic activities;Therapeutic exercise;Balance training;Patient/family education;Wheelchair mobility training    Alan Goals (Current goals can be found in the Care Plan section)  Acute Rehab Alan Goals Patient Stated Goal: return home today, hopefully Alan Lin: With patient Time For Goal Achievement: 02/19/24 Potential to Achieve Goals: Good    Frequency Min 2X/week     Co-evaluation Alan/OT/SLP Co-Evaluation/Treatment: Yes Reason for Co-Treatment: Complexity of the patient's impairments (multi-system involvement);For patient/therapist safety;To address functional/ADL transfers Alan goals addressed during session: Mobility/safety with mobility;Balance;Proper use of DME OT goals addressed during session: ADL's and self-care       AM-PAC Alan 6 Clicks Mobility  Outcome Measure Help needed turning from your back to your side while in a flat bed without  using bedrails?: A Lot Help needed moving from lying on your back to sitting on the side of a flat bed without using bedrails?: A Lot Help needed moving to and from a bed to a chair (including a wheelchair)?: Total Help needed standing up from a chair using your arms (e.g., wheelchair or bedside chair)?: A Lot Help needed to walk in hospital room?: Total Help needed climbing 3-5 steps with a railing? : Total 6 Click Score: 9    End of Session  Equipment Utilized During Treatment: Gait belt Activity Tolerance: Patient tolerated treatment well;Patient limited by pain Patient left: in bed;with call bell/phone within reach;with bed alarm set Nurse Communication: Mobility status Alan Visit Diagnosis: Muscle weakness (generalized) (M62.81);Difficulty in walking, not elsewhere classified (R26.2) Pain - Right/Left: Right Pain - part of body: Ankle and joints of foot    Time: 1355-1426 Alan Time Calculation (min) (ACUTE ONLY): 31 min   Charges:   Alan Evaluation $Alan Eval Moderate Complexity: 1 Mod   Alan General Charges $$ ACUTE Alan VISIT: 1 Visit         Tori Shivansh Hardaway Alan, DPT 02/05/24, 2:42 PM

## 2024-02-05 NOTE — Anesthesia Preprocedure Evaluation (Signed)
 Anesthesia Evaluation  Patient identified by MRN, date of birth, ID band Patient awake    Reviewed: Allergy & Precautions, H&P , NPO status , Patient's Chart, lab work & pertinent test results  Airway Mallampati: II   Neck ROM: full    Dental   Pulmonary sleep apnea    breath sounds clear to auscultation       Cardiovascular hypertension, + dysrhythmias Atrial Fibrillation  Rhythm:regular Rate:Normal     Neuro/Psych  PSYCHIATRIC DISORDERS Anxiety Depression       GI/Hepatic   Endo/Other  diabetes, Type 2    Renal/GU      Musculoskeletal  (+) Arthritis ,    Abdominal   Peds  Hematology   Anesthesia Other Findings   Reproductive/Obstetrics                              Anesthesia Physical Anesthesia Plan  ASA: 3  Anesthesia Plan: MAC   Post-op Pain Management:    Induction: Intravenous  PONV Risk Score and Plan: 1 and Propofol  infusion and Treatment may vary due to age or medical condition  Airway Management Planned: Nasal Cannula  Additional Equipment:   Intra-op Plan:   Post-operative Plan:   Informed Consent: I have reviewed the patients History and Physical, chart, labs and discussed the procedure including the risks, benefits and alternatives for the proposed anesthesia with the patient or authorized representative who has indicated his/her understanding and acceptance.     Dental advisory given  Plan Discussed with: CRNA, Anesthesiologist and Surgeon  Anesthesia Plan Comments:         Anesthesia Quick Evaluation

## 2024-02-05 NOTE — Progress Notes (Signed)
 Progress Note    Alan Lin   FMW:[id number]  DOB: August 10, 1955  DOA: 02/01/2024     4 PCP: Alan Greig NOVAK, MD  Initial CC: elevated HR  Hospital Course: Alan Lin is a 68 year old male with PMH PAF, PSVT, nonobstructive CAD, HTN, HLD, osteoarthritis, DM II, peripheral neuropathy, BPH, cervical/lumbar DDD, OSA on CPAP, depression/anxiety. Recent hospital admission 7/26-8/1 for persistent atrial flutter with RVR.  He was previously not on anticoagulation due to his fall risk but decision was made to initiate anticoagulation during that hospitalization.  Cardiology was consulted too and he was discharged on Eliquis , Cardizem  360 mg daily, and metoprolol  100 mg twice daily.   Now presents for this hospitalization from [facility] for evaluation of tachycardia and some heart palpitations during physical therapy evaluation. He was noted to be in a flutter with RVR, rate 140s on arrival. Appears that he has not been on Eliquis  since discharge from prior hospitalization; reasons unknown. He was started on a Cardizem  drip and admitted for cardiology evaluation as well. He was able to undergo TEE and cardioversion on 02/05/2024 with conversion back to NSR.  Interval History:  Underwent cardioversion today at [facility].  Successful conversion back to sinus.  Seen in his room after returning. PT to see.   Assessment and Plan: * Atrial flutter with rapid ventricular response (HCC) - CTA chest negative for PE - Some suspected noncompliance with meds, unclear reasons; he went to SNF so unclear why Eliquis  was not continued at previous discharge - Previously discharged on Lopressor  and Cardizem  - Cardiology again following, appreciate assistance -Will need to ensure medication compliance at discharge and ability to administer effectively.  If going back to SNF, this should be easier to accomplish at least in the short-term; may still need home health RN upon discharge from SNF to ensure  compliance - s/p TEE DCCV on 8/18 with conversion to NSR  Diabetic neuropathy (HCC) - Continue gabapentin  and Cymbalta   Depressive disorder Continue Cymbalta  and trazodone   BPH (benign prostatic hyperplasia) - Continue Flomax   OSA (obstructive sleep apnea) - Continue CPAP at bedtime  Essential hypertension - Follow-up for final discharge plans - Previously on metoprolol  and Cardizem   Hyperlipidemia - On Lipitor at home  Anxiety - Continue Cymbalta  and trazodone   Type 2 diabetes mellitus (HCC) - On Jardiance  and semaglutide  - SSI while inpatient - Last A1c 6.7% on 01/18/2024    Old records reviewed in assessment of this patient  Antimicrobials:   DVT prophylaxis:   apixaban  (ELIQUIS ) tablet 5 mg  apixaban  (ELIQUIS ) 5 MG tablet   Code Status:   Code Status: Full Code  Mobility Assessment (Last 72 Hours)     Mobility Assessment     Row Name 02/05/24 1200 02/04/24 2100 02/04/24 0836 02/04/24 0835 02/03/24 2100   Does the patient have exclusion criteria? No - Perform mobility assessment Yes - Bedfast (Level 1) - Select exclusion criteria in next row Yes - Bedfast (Level 1) - Select exclusion criteria in next row Yes - Bedfast (Level 1) - Select exclusion criteria in next row Yes - Bedfast (Level 1) - Select exclusion criteria in next row   Mobility Assessment Exclusion Criteria -- -- -- Order for bedrest (clairify order) --   What is the highest level of mobility based on the mobility assessment? Level 2 (Chairfast) - Balance while sitting on edge of bed and cannot stand Level 2 (Chairfast) - Balance while sitting on edge of bed and cannot stand -- -- Level  2 (Chairfast) - Balance while sitting on edge of bed and cannot stand   Is the above level different from baseline mobility prior to current illness? Yes - Recommend PT order Yes - Recommend PT order -- -- Yes - Recommend PT order    Row Name 02/03/24 0735 02/02/24 2100         Does the patient have exclusion  criteria? Yes - Bedfast (Level 1) - Select exclusion criteria in next row Yes - Bedfast (Level 1) - Select exclusion criteria in next row      Mobility Assessment Exclusion Criteria Order for bedrest (clairify order) --      What is the highest level of mobility based on the mobility assessment? -- Level 2 (Chairfast) - Balance while sitting on edge of bed and cannot stand      Is the above level different from baseline mobility prior to current illness? -- Yes - Recommend PT order         Barriers to discharge: none Disposition Plan:  SNF HH orders placed: TBD Status is: Inpt  Objective: Blood pressure 119/79, pulse 81, temperature (!) 97.4 F (36.3 C), temperature source Oral, resp. rate 16, height 5' 10 (1.778 m), weight 108.9 kg, SpO2 97%.  Examination:  Physical Exam Constitutional:      General: He is not in acute distress.    Appearance: Normal appearance.  HENT:     Head: Normocephalic and atraumatic.     Mouth/Throat:     Mouth: Mucous membranes are moist.  Eyes:     Extraocular Movements: Extraocular movements intact.  Cardiovascular:     Rate and Rhythm: Normal rate and regular rhythm.  Pulmonary:     Effort: Pulmonary effort is normal. No respiratory distress.     Breath sounds: Normal breath sounds. No wheezing.  Abdominal:     General: Bowel sounds are normal. There is no distension.     Palpations: Abdomen is soft.     Tenderness: There is no abdominal tenderness.  Musculoskeletal:        General: Normal range of motion.     Cervical back: Normal range of motion and neck supple.  Skin:    General: Skin is warm and dry.  Neurological:     General: No focal deficit present.     Mental Status: He is alert.  Psychiatric:        Mood and Affect: Mood normal.        Behavior: Behavior normal.      Consultants:  Cardiology  Procedures:  02/05/2024: TEE DCCV  Data Reviewed: Results for orders placed or performed during the hospital encounter of 02/01/24  (from the past 24 hours)  Glucose, capillary     Status: Abnormal   Collection Time: 02/04/24  4:38 PM  Result Value Ref Range   Glucose-Capillary 162 (H) 70 - 99 mg/dL  Glucose, capillary     Status: Abnormal   Collection Time: 02/04/24  9:08 PM  Result Value Ref Range   Glucose-Capillary 136 (H) 70 - 99 mg/dL  Basic metabolic panel with GFR     Status: Abnormal   Collection Time: 02/05/24  4:36 AM  Result Value Ref Range   Sodium 134 (L) 135 - 145 mmol/L   Potassium 3.6 3.5 - 5.1 mmol/L   Chloride 102 98 - 111 mmol/L   CO2 23 22 - 32 mmol/L   Glucose, Bld 171 (H) 70 - 99 mg/dL   BUN 16 8 - 23 mg/dL  Creatinine, Ser 0.68 0.61 - 1.24 mg/dL   Calcium  8.6 (L) 8.9 - 10.3 mg/dL   GFR, Estimated >39 >39 mL/min   Anion gap 9 5 - 15  Magnesium      Status: None   Collection Time: 02/05/24  4:36 AM  Result Value Ref Range   Magnesium  1.7 1.7 - 2.4 mg/dL  Glucose, capillary     Status: Abnormal   Collection Time: 02/05/24  7:49 AM  Result Value Ref Range   Glucose-Capillary 149 (H) 70 - 99 mg/dL  Glucose, capillary     Status: Abnormal   Collection Time: 02/05/24 12:10 PM  Result Value Ref Range   Glucose-Capillary 156 (H) 70 - 99 mg/dL    I have reviewed pertinent nursing notes, vitals, labs, and images as necessary. I have ordered labwork to follow up on as indicated.  I have reviewed the last notes from staff over past 24 hours. I have discussed patient's care plan and test results with nursing staff, CM/SW, and other staff as appropriate.  Time spent: Greater than 50% of the 55 minute visit was spent in counseling/coordination of care for the patient as laid out in the A&P.   LOS: 4 days   Alm Apo, MD [facility] 02/05/2024, 2:03 PM

## 2024-02-05 NOTE — Transfer of Care (Signed)
 Immediate Anesthesia Transfer of Care Note  Patient: SUHAS ESTIS  Procedure(s) Performed: TRANSESOPHAGEAL ECHOCARDIOGRAM CARDIOVERSION  Patient Location: Cath Lab  Anesthesia Type:MAC  Level of Consciousness: drowsy and patient cooperative  Airway & Oxygen  Therapy: Patient Spontanous Breathing and Patient connected to nasal cannula oxygen   Post-op Assessment: Report given to RN and Post -op Vital signs reviewed and stable  Post vital signs: Reviewed and stable  Last Vitals:  Vitals Value Taken Time  BP 90/66 02/05/2024 1003  Temp 37 C 02/05/2024 1003  Pulse 65 02/05/2024 1003  Resp 14 02/05/2024 1003  SpO2 92% 02/05/2024 1003    Last Pain:  Vitals:   02/05/24 0915  TempSrc: Temporal  PainSc: 2          Complications: No notable events documented.

## 2024-02-05 NOTE — Op Note (Signed)
 INDICATIONS: Atrial flutter, precardioversion  PROCEDURE:   Informed consent was obtained prior to the procedure. The risks, benefits and alternatives for the procedure were discussed and the patient comprehended these risks.  Risks include, but are not limited to, cough, sore throat, vomiting, nausea, somnolence, esophageal and stomach trauma or perforation, bleeding, low blood pressure, aspiration, pneumonia, infection, trauma to the teeth and death.    After a procedural time-out, the oropharynx was anesthetized with 20% benzocaine spray.   During this procedure the patient was administered IV propofol  by Anesthesiology, Dr. Maryclare.  The transesophageal probe was inserted in the esophagus and stomach without difficulty and multiple views were obtained.  The patient was kept under observation until the patient left the procedure room.  The patient left the procedure room in stable condition.   Agitated microbubble saline contrast was administered.  COMPLICATIONS:    There were no immediate complications.  FINDINGS:  Severely dilated left atrium. Normal left ventricular systolic function. Aortic valve sclerosis, trivial MR.  RECOMMENDATIONS:     Proceed with cardioversion  Time Spent Directly with the Patient:  30 minutes   Alan Lin 02/05/2024, 10:00 AM

## 2024-02-05 NOTE — Evaluation (Addendum)
 Occupational Therapy Evaluation Patient Details Name: Alan Lin MRN: [id number] DOB: Nov 22, 1955 Today's Date: 02/05/2024   History of Present Illness   Alan Lin is a 68 y.o. male admitted with a flutter with RVR; s/p cardioversion 02/05/24. PMH: osteoarthritis, type 2 diabetes, hyperlipidemia, hypomagnesemia, hypophosphatemia, hypertension, atrial fibrillation, BPH, degeneration of lumbar intervertebral disc, cervical DDD, cervical radiculitis, history of falls, obstructive sleep apnea, anxiety, history of sepsis, history of syncope, paroxysmal atrial fibrillation     Clinical Impressions Pt seen for OT/PT co-evaluation. Pt from [facility] with private duty caregivers who assist for all self-care tasks from 9a-1p & 4p-8p. Pt states he remains in bed with diaper on when PCAs are not present, but also provides conflicting information and states he is able to ambulate to / from bathroom using RW mod independently. Pt presents with generalized weakness, decreased functional activity tolerance, impaired cognition and decreased task initiation. Pt requires excessive time to initiate all mobility efforts, slow to process/answer questions and requires cues for task sequencing. MOD A +2 for functional STS transfers using RW, poor standing tolerance to less than 10 seconds that decreases with pt fatigue, and requires MAX A for pericare in standing. Anticipate pt will require MAX - TOTAL A for LB ADLs.   Pt reports that his hired PCAs can provide necessary levels of assist; recommending pt return to [facility] with PCA support. If staff cannot provide this level of assistance, pt will require STR. Due to cognitive deficits, pt will require assist for higher level IADLs such as med and financial mgmt. Pt would benefit from skilled OT services to address noted impairments and functional limitations (see below for any additional details) in order to maximize safety and independence while  minimizing falls risk and caregiver burden. Anticipate the need for follow up OT services upon acute hospital DC.      If plan is discharge home, recommend the following:   Two people to help with walking and/or transfers;A lot of help with bathing/dressing/bathroom;Direct supervision/assist for medications management;Direct supervision/assist for financial management;Assist for transportation;Help with stairs or ramp for entrance;Supervision due to cognitive status     Functional Status Assessment   Patient has had a recent decline in their functional status and demonstrates the ability to make significant improvements in function in a reasonable and predictable amount of time.     Equipment Recommendations   None recommended by OT      Precautions/Restrictions   Precautions Precautions: Fall Recall of Precautions/Restrictions: Impaired Precaution/Restrictions Comments: monitor HR Restrictions Weight Bearing Restrictions Per Provider Order: No     Mobility Bed Mobility Overal bed mobility: Needs Assistance Bed Mobility: Supine to Sit     Supine to sit: Min assist, HOB elevated, Used rails Sit to supine: Max assist, Used rails   General bed mobility comments: significantly increased time, min A to upright trunk while using bedrails and elevated HOB, slow to scoot to sitting EOB with increased effort    Transfers Overall transfer level: Needs assistance Equipment used: Rolling walker (2 wheels) Transfers: Sit to/from Stand Sit to Stand: Mod assist, +2 physical assistance, From elevated surface           General transfer comment: decreased initiation, mod A+2 to power up to stand for 5 reps from elevated bed, limited standing tolerance to 10 seconds at best, pt reporting R ankle pain limiting      Balance Overall balance assessment: Needs assistance Sitting-balance support: Feet supported, Bilateral upper extremity supported Sitting balance-Leahy  Scale:  Fair     Standing balance support: Reliant on assistive device for balance, During functional activity, Bilateral upper extremity supported Standing balance-Leahy Scale: Poor Standing balance comment: STS x5 throughout session                           ADL either performed or assessed with clinical judgement   ADL Overall ADL's : Needs assistance/impaired Eating/Feeding: Set up;Bed level   Grooming: Sitting;Supervision/safety   Upper Body Bathing: Minimal assistance;Sitting   Lower Body Bathing: Maximal assistance;Bed level   Upper Body Dressing : Minimal assistance;Bed level   Lower Body Dressing: Total assistance;Bed level       Toileting- Clothing Manipulation and Hygiene: Maximal assistance;Sit to/from stand Toileting - Clothing Manipulation Details (indicate cue type and reason): pt with limited standing tolerance, noted to have small BM smear on bed pad; stood x2 attempts for pericare with MAX A +2     Functional mobility during ADLs: Maximal assistance;+2 for physical assistance;+2 for safety/equipment;Rolling walker (2 wheels);Cueing for sequencing;Cueing for safety General ADL Comments: Poor standing tolerance limits gait or ADL progression on eval. Pt with less than 10 seconds tolerance     Vision Baseline Vision/History: 0 No visual deficits Ability to See in Adequate Light: 0 Adequate Patient Visual Report: No change from baseline              Pertinent Vitals/Pain Pain Assessment Pain Assessment: Faces Faces Pain Scale: Hurts even more Pain Location: R ankle Pain Descriptors / Indicators: Sore, Grimacing, Guarding Pain Intervention(s): Limited activity within patient's tolerance, Monitored during session, Repositioned     Extremity/Trunk Assessment Upper Extremity Assessment Upper Extremity Assessment: Generalized weakness;Right hand dominant   Lower Extremity Assessment Lower Extremity Assessment: Generalized weakness (AROM WFL, strength  grossly 3+/5, denies numbness/tingling throughout)   Cervical / Trunk Assessment Cervical / Trunk Assessment: Normal   Communication Communication Communication: No apparent difficulties   Cognition Arousal: Alert Behavior During Therapy: WFL for tasks assessed/performed Cognition: History of cognitive impairments             OT - Cognition Comments: decreased insight and judgement into deficits and limited STM. provides conflicting info about PLOF.                 Following commands: Impaired Following commands impaired: Follows one step commands with increased time     Cueing  General Comments   Cueing Techniques: Verbal cues;Gestural cues;Tactile cues;Visual cues  HR up to 101 bpm max with mobility, 90s at rest. IV placed in R hand noted to spontanosly fall out with pt moving hand to RW, pressure applied and RN called into room to address.   Exercises     Shoulder Instructions      Home Living Family/patient expects to be discharged to:: Assisted living                             Home Equipment: Rolling Walker (2 wheels);Wheelchair - manual;Cane - single point;Grab bars - toilet;Grab bars - tub/shower   Additional Comments: [facility]      Prior Functioning/Environment Prior Level of Function : Needs assist       Physical Assist : Mobility (physical);ADLs (physical) Mobility (physical): Bed mobility;Transfers;Gait;Stairs ADLs (physical): Bathing;Dressing;Toileting;IADLs Mobility Comments: pt with varying reports, states went to rehab after last hospitalization and had progressed to walking up to 170 ft with RW with therapy, propelling self  in w/c for facility distances ADLs Comments: pt reports has CNA 9-1 and 4-8 who assist with all self care, remains in bed when no one present with diaper on, several minutes prior stated he could walk t/f bathroom    OT Problem List: Decreased strength;Decreased activity tolerance;Impaired balance  (sitting and/or standing);Decreased cognition;Decreased safety awareness;Decreased knowledge of use of DME or AE;Decreased knowledge of precautions;Cardiopulmonary status limiting activity;Obesity;Pain;Increased edema   OT Treatment/Interventions: Self-care/ADL training;Therapeutic exercise;Energy conservation;DME and/or AE instruction;Therapeutic activities;Modalities;Cognitive remediation/compensation;Patient/family education;Balance training      OT Goals(Current goals can be found in the care plan section)   Acute Rehab OT Goals OT Goal Formulation: With patient Time For Goal Achievement: 02/19/24 Potential to Achieve Goals: Fair   OT Frequency:  Min 2X/week    Co-evaluation PT/OT/SLP Co-Evaluation/Treatment: Yes Reason for Co-Treatment: Complexity of the patient's impairments (multi-system involvement);For patient/therapist safety;To address functional/ADL transfers PT goals addressed during session: Mobility/safety with mobility;Balance;Proper use of DME OT goals addressed during session: ADL's and self-care      AM-PAC OT 6 Clicks Daily Activity     Outcome Measure Help from another person eating meals?: A Little Help from another person taking care of personal grooming?: A Little Help from another person toileting, which includes using toliet, bedpan, or urinal?: A Lot Help from another person bathing (including washing, rinsing, drying)?: A Lot Help from another person to put on and taking off regular upper body clothing?: A Little Help from another person to put on and taking off regular lower body clothing?: A Lot 6 Click Score: 15   End of Session Equipment Utilized During Treatment: Gait belt;Rolling walker (2 wheels) Nurse Communication: Mobility status;Other (comment) (IV dislodged with bleeding)  Activity Tolerance: Patient limited by pain Patient left: in bed;with call bell/phone within reach;with bed alarm set  OT Visit Diagnosis: Unsteadiness on feet  (R26.81);History of falling (Z91.81);Muscle weakness (generalized) (M62.81);Pain;Cognitive communication deficit (R41.841) Symptoms and signs involving cognitive functions: Other cerebrovascular disease Pain - Right/Left: Right Pain - part of body: Knee;Ankle and joints of foot;Leg                Time: 1355-1426 OT Time Calculation (min): 31 min Charges:  OT General Charges $OT Visit: 1 Visit OT Evaluation $OT Eval Moderate Complexity: 1 Mod  Ayren Zumbro L. Taia Bramlett, OTR/L  02/05/24, 2:55 PM

## 2024-02-05 NOTE — Progress Notes (Signed)
   02/04/24 2243  BiPAP/CPAP/SIPAP  BiPAP/CPAP/SIPAP Pt Type Adult  BiPAP/CPAP/SIPAP Resmed  Mask Type Full face mask  Dentures removed? Not applicable  Mask Size Medium  EPAP 14 cmH2O  FiO2 (%) 21 %  Patient Home Machine Yes  Safety Check Completed by RT for Home Unit Yes, no issues noted  Patient Home Mask Yes  Patient Home Tubing Yes  Auto Titrate Yes  Device Plugged into RED Power Outlet Yes

## 2024-02-05 NOTE — Interval H&P Note (Signed)
 History and Physical Interval Note:  02/05/2024 8:47 AM  Alan Lin  has presented today for surgery, with the diagnosis of Afib.  The various methods of treatment have been discussed with the patient and family. After consideration of risks, benefits and other options for treatment, the patient has consented to  Procedure(s): TRANSESOPHAGEAL ECHOCARDIOGRAM (N/A) CARDIOVERSION (N/A) as a surgical intervention.  The patient's history has been reviewed, patient examined, no change in status, stable for surgery.  I have reviewed the patient's chart and labs.  Questions were answered to the patient's satisfaction.     [name]

## 2024-02-05 NOTE — Op Note (Signed)
 Procedure: Electrical Cardioversion Indications:  Atrial Flutter  Procedure Details:  Consent: Risks of procedure as well as the alternatives and risks of each were explained to the (patient/caregiver).  Consent for procedure obtained.  Time Out: Verified patient identification, verified procedure, site/side was marked, verified correct patient position, special equipment/implants available, medications/allergies/relevent history reviewed, required imaging and test results available.  Performed  Patient placed on cardiac monitor, pulse oximetry, supplemental oxygen  as necessary.  Sedation given: IV propofol , Dr. Maryclare Pacer pads placed anterior and posterior chest.  Cardioverted 1 time(s).  Cardioversion with synchronized biphasic 200J shock.  Evaluation: Findings: Post procedure EKG shows: NSR Complications: None Patient did tolerate procedure well.  Time Spent Directly with the Patient:  30 minutes   Alan Lin 02/05/2024, 10:02 AM

## 2024-02-05 NOTE — TOC Initial Note (Addendum)
 Transition of Care [facility]) - Initial/Assessment Note    Patient Details  Name: Alan Lin MRN: [id number] Date of Birth: [birth year]  Transition of Care [facility]) CM/SW Contact:    Bascom Service, RN Phone Number: 02/05/2024, 12:49 PM  Clinical Narrative:  [facility]-has w/c,rw,CPAP,private duty care. Left vm w/[facility] Indep living rep Melanie/Kirsten.Await PT eval.  -3:34p    Patient is checking with the facility since the cost will be drastically different from Indep Living vs ALF level. He alreay uses his private duty aides for custodial level care-they can remind him to take meds.The facility has HHPT/OT already there. Patient has own transport home. MD to f/u in am.           Expected Discharge Plan:  (TBD) Barriers to Discharge: Continued Medical Work up   Patient Goals and CMS Choice Patient states their goals for this hospitalization and ongoing recovery are:: [facility] CMS Medicare.gov Compare Post Acute Care list provided to:: Patient Choice offered to / list presented to : Patient [place] ownership interest in [facility].provided to:: Patient    Expected Discharge Plan and Services   Discharge Planning Services: CM Consult Post Acute Care Choice: Resumption of Svcs/PTA Provider Living arrangements for the past 2 months: Independent Living Facility                                      Prior Living Arrangements/Services Living arrangements for the past 2 months: Independent Living Facility Lives with:: Self              Current home services: DME (w/c,rw,cpap)    Activities of Daily Living   ADL Screening (condition at time of admission) Independently performs ADLs?: No Does the patient have a NEW difficulty with bathing/dressing/toileting/self-feeding that is expected to last >3 days?: No Does the patient have a NEW difficulty with getting in/out of bed, walking, or climbing stairs that is  expected to last >3 days?: No Does the patient have a NEW difficulty with communication that is expected to last >3 days?: No Is the patient deaf or have difficulty hearing?: No Does the patient have difficulty seeing, even when wearing glasses/contacts?: No Does the patient have difficulty concentrating, remembering, or making decisions?: No  Permission Sought/Granted                  Emotional Assessment              Admission diagnosis:  Atrial flutter with rapid ventricular response (HCC) [I48.92] Patient Active Problem List   Diagnosis Date Noted   Atrial flutter with rapid ventricular response (HCC) 02/01/2024   Type 2 diabetes mellitus (HCC) 02/01/2024   Obesity, Class I, BMI 30-34.9 01/17/2024   Atrial fibrillation with RVR (HCC) 01/13/2024   Diabetic neuropathy (HCC) 11/30/2023   Generalized weakness 10/20/2023   Paroxysmal atrial fibrillation (HCC) 10/20/2023   BPH (benign prostatic hyperplasia) 10/20/2023   Hyperlipidemia 07/21/2022   Essential hypertension 07/21/2022   OSA (obstructive sleep apnea) 07/21/2022   Falls 07/21/2022   Degeneration of lumbar intervertebral disc 11/01/2019   DDD (degenerative disc disease), cervical 10/28/2019   Cervical radiculitis 09/20/2019   Displacement 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 09/20/2019   Anxiety 07/16/2019   Right hip pain 07/30/2018   Primary osteoarthritis of both knees 07/30/2018   Pain in buttock 07/30/2018   Pain in joint, shoulder region  11/29/2016   Type 2 diabetes mellitus with hyperglycemia (HCC) 03/15/2016   Depressive disorder 07/03/2012   PCP:  Agustina Greig NOVAK, MD Pharmacy:   [facility] [phone] - [place], [place] - [address] AT [facility] OF [address] [place] [address] Phone: [phone] Fax: [phone]  [id number] - [place], [place] - [address] [place] [address] Phone:  [phone] Fax: [phone]     Social Drivers of Health (SDOH) Social History: SDOH Screenings   Food Insecurity: No Food Insecurity (02/02/2024)  Housing: Low Risk  (02/02/2024)  Transportation Needs: No Transportation Needs (02/02/2024)  Utilities: Not At Risk (02/02/2024)  Financial Resource Strain: Low Risk  (08/29/2023)   Received from [facility]  Physical Activity: Insufficiently Active (08/29/2023)   Received from [facility]  Social Connections: Unknown (02/02/2024)  Tobacco Use: Low Risk  (02/02/2024)  Health Literacy: Low Risk  (08/29/2023)   Received from [facility]   SDOH Interventions:     Readmission Risk Interventions     No data to display

## 2024-02-06 ENCOUNTER — Other Ambulatory Visit (HOSPITAL_COMMUNITY): Payer: Self-pay

## 2024-02-06 DIAGNOSIS — I4892 Unspecified atrial flutter: Secondary | ICD-10-CM | POA: Diagnosis not present

## 2024-02-06 LAB — GLUCOSE, CAPILLARY
Glucose-Capillary: 132 mg/dL — ABNORMAL HIGH (ref 70–99)
Glucose-Capillary: 186 mg/dL — ABNORMAL HIGH (ref 70–99)

## 2024-02-06 MED ORDER — APIXABAN 5 MG PO TABS
5.0000 mg | ORAL_TABLET | Freq: Two times a day (BID) | ORAL | 3 refills | Status: AC
  Filled 2024-02-06: qty 60, 30d supply, fill #0
  Filled 2024-03-06: qty 60, 30d supply, fill #1

## 2024-02-06 NOTE — Anesthesia Postprocedure Evaluation (Signed)
 Anesthesia Post Note  Patient: SILVIANO NEUSER  Procedure(s) Performed: TRANSESOPHAGEAL ECHOCARDIOGRAM CARDIOVERSION     Patient location during evaluation: Cath Lab Anesthesia Type: MAC Level of consciousness: awake and alert Pain management: pain level controlled Vital Signs Assessment: post-procedure vital signs reviewed and stable Respiratory status: spontaneous breathing, nonlabored ventilation, respiratory function stable and patient connected to nasal cannula oxygen  Cardiovascular status: stable and blood pressure returned to baseline Postop Assessment: no apparent nausea or vomiting Anesthetic complications: no   No notable events documented.  Last Vitals:  Vitals:   02/06/24 0330 02/06/24 0654  BP: 122/74 126/76  Pulse: 99 95  Resp: 17 18  Temp: 36.6 C 36.6 C  SpO2: 94% 93%    Last Pain:  Vitals:   02/06/24 0654  TempSrc: Oral  PainSc:                  Kristalynn Coddington S

## 2024-02-06 NOTE — Progress Notes (Signed)
 Discharge medication delivered to patient at the bedside in a secured bag D Alan Lin

## 2024-02-06 NOTE — TOC Transition Note (Addendum)
 Transition of Care [facility]) - Discharge Note   Patient Details  Name: Alan Lin MRN: [id number] Date of Birth: Jul 30, 1955  Transition of Care [facility]) CM/SW Contact:  Bascom Service, RN Phone Number: 02/06/2024, 10:49 AM   Clinical Narrative: Patient's d/c plan return back to [facility] Ind[facility] living apt-already has private duty caregivers;[facility] for HHPT/OT-faxed orders to [phone]. Will confirm patient has own transport.   -12:43p-Patient has own transport home-[employer]. No further CM needs. -1:15p Patient in agreement to PTAR-he will have caregivers to receive him once @ the apt.PTAR called no further CM needs.    Final next level of care: Home/Self Care Barriers to Discharge: No Barriers Identified   Patient Goals and CMS Choice Patient states their goals for this hospitalization and ongoing recovery are:: Return back to [facility] CMS Medicare.gov Compare Post Acute Care list provided to:: Patient Choice offered to / list presented to : Patient [place] ownership interest in [facility].provided to:: Patient    Discharge Placement                       Discharge Plan and Services Additional resources added to the After Visit Summary for     Discharge Planning Services: CM Consult Post Acute Care Choice: Home Health (HHPT/OT)                    HH Arranged: PT, OT HH Agency: Other - See comment International aid/development worker) Date HH Agency Contacted: 02/06/24 Time HH Agency Contacted: 1049 Representative spoke with at [facility] Agency: [facility] contracts with Chief Technology Officer  Social Drivers of Health (SDOH) Interventions SDOH Screenings   Food Insecurity: No Food Insecurity (02/02/2024)  Housing: Low Risk  (02/02/2024)  Transportation Needs: No Transportation Needs (02/02/2024)  Utilities: Not At Risk (02/02/2024)  Financial Resource Strain: Low Risk  (08/29/2023)   Received from [facility]  Physical Activity:  Insufficiently Active (08/29/2023)   Received from [facility]  Social Connections: Unknown (02/02/2024)  Tobacco Use: Low Risk  (02/02/2024)  Health Literacy: Low Risk  (08/29/2023)   Received from [facility]     Readmission Risk Interventions     No data to display

## 2024-02-06 NOTE — Progress Notes (Signed)
  Progress Note  Patient Name: Alan Lin Date of Encounter: 02/06/2024 [facility] Cardiologist: None   Interval Summary   Patient denies any chest pain or shortness of breath.  Is like he is doing well since his cardioversion  Vital Signs Vitals:   02/05/24 2326 02/05/24 2329 02/06/24 0330 02/06/24 0654  BP: 116/67  122/74 126/76  Pulse: (!) 111 (!) 109 99 95  Resp: 20  17 18   Temp: 98.5 F (36.9 C)  97.8 F (36.6 C) 97.8 F (36.6 C)  TempSrc: Oral  Oral Oral  SpO2: 93%  94% 93%  Weight:      Height:        Intake/Output Summary (Last 24 hours) at 02/06/2024 0920 Last data filed at 02/06/2024 0530 Gross per 24 hour  Intake 240 ml  Output 650 ml  Net -410 ml      02/02/2024    1:02 PM 01/13/2024    1:38 AM 11/20/2023    5:51 PM  Last 3 Weights  Weight (lbs) 239 lb 15.9 oz 240 lb 240 lb  Weight (kg) 108.86 kg 108.863 kg 108.863 kg      Telemetry/ECG  Normal sinus rhythm with heart rates in the 80s to 100s, first-degree AV block- Personally Reviewed  Physical Exam  GEN: No acute distress.   Neck: No JVD Cardiac: RRR, no murmurs, rubs, or gallops.  Respiratory: Clear to auscultation bilaterally. GI: Soft, nontender, non-distended  MS: No edema  Assessment & Plan  68 y.o. male with a history of hypertension, hyperlipidemia, PAF, nonobstructive coronary disease by heart catheterization in 2019, diabetes, morbid obesity, normal pressure hydrocephalus, frequent falls, OSA on CPAP admitted with atrial flutter with rapid ventricular rates   Atrial flutter CHA2DS2-VASc Score = 4 [CHF History: 0, HTN History: 1, Diabetes History: 1, Stroke History: 0, Vascular Disease History: 1 (mild CAD, aortic atherosclerosis), Age Score: 1, Gender Score: 0].  Therefore, the patient's annual risk of stroke is 4.8 %.    Has OSA and wears CPAP.  PTA was on Cardizem  CD 360 mg daily, amlodipine  10 mg daily, losartan 50 mg daily, and metoprolol  100 mg daily.  Blood pressure  stable most recent BP 126/76.  Rates in 80s to 100s on telemetry.  Patient had successful TEE cardioversion yesterday and had a normal LVEF.  Continue Eliquis  5 mg twice daily Continue metoprolol  titrate 100 mg twice daily Patient wants to follow-up with [facility]   CAD Hyperlipidemia Cardiac cath in 2019 showed nonobstructive CAD LDL 49 on lipid panel on 02/04/2024 Continue atorvastatin  10 mg daily No need for aspirin as is on Eliquis  for a flutter   OSA Wears CPAP   Otherwise manage per primary  [facility] will sign off.   The patient is ready for discharge today from a cardiac standpoint. Medication Recommendations:  As above. Other recommendations (labs, testing, etc):  As above. Follow up as an outpatient: Patient wants to follow-[facility] with [facility]. For questions or updates, please contact [facility] Please consult [email or web address] for contact info under       Signed, Maxden Naji, PA-C

## 2024-02-06 NOTE — Discharge Summary (Signed)
 Physician Discharge Summary   Alan Lin FMW:[id number] DOB: 11/08/1955 DOA: 02/01/2024  PCP: Agustina Greig NOVAK, MD  Admit date: 02/01/2024 Discharge date: 02/06/2024  Admitted From: [facility] Disposition: [facility] independent [facility] Discharging physician: Alm Apo, MD Barriers to discharge: None  Recommendations at discharge: If hospitalized again and patient noncompliant, would pursue capacity evaluation with psychiatry.  He may not be able to continue independent living with debility and concern for lack of capacity   Home Health: PT, OT  Discharge Condition: stable CODE STATUS: Full  Diet recommendation:  Diet Orders (From admission, onward)     Start     Ordered   02/06/24 0000  Diet - low sodium heart healthy        02/06/24 1037   02/05/24 1127  Diet Carb Modified Fluid consistency: Thin; Room service appropriate? Yes  Diet effective now       Question Answer Comment  Diet-HS Snack? Nothing   Calorie Level Medium 1600-2000   Fluid consistency: Thin   Room service appropriate? Yes      02/05/24 1127            Hospital Course: Mr. Stauffer is a 68 year old male with PMH PAF, PSVT, nonobstructive CAD, HTN, HLD, osteoarthritis, DM II, peripheral neuropathy, BPH, cervical/lumbar DDD, OSA on CPAP, depression/anxiety. Recent hospital admission 7/26-8/1 for persistent atrial flutter with RVR.  He was previously not on anticoagulation due to his fall risk but decision was made to initiate anticoagulation during that hospitalization.  Cardiology was consulted too and he was discharged on Eliquis , Cardizem  360 mg daily, and metoprolol  100 mg twice daily.   Now presents for this hospitalization from [facility] for evaluation of tachycardia and some heart palpitations during physical therapy evaluation. He was noted to be in a flutter with RVR, rate 140s on arrival. Appears that he has not been on Eliquis  since discharge from prior  hospitalization; reasons unknown. He was started on a Cardizem  drip and admitted for cardiology evaluation as well. He was able to undergo TEE and cardioversion on 02/05/2024 with conversion back to NSR.  Assessment and Plan: * Atrial flutter with rapid ventricular response (HCC) - CTA chest negative for PE - Some suspected noncompliance with meds, unclear reasons; he actually resides in independent living at [facility], but has caretakers - Previously discharged on Lopressor  and Cardizem  - Cardiology again following, appreciate assistance -Will need to ensure medication compliance at discharge and ability to administer effectively.  - s/p TEE DCV on 8/18 with conversion to NSR - Continued on Lopressor  and Eliquis  at discharge -Outpatient follow-up with [facility] -If hospitalized again with concerns for noncompliance, would have stronger suspicion for needing capacity evaluation with psychiatry  Diabetic neuropathy (HCC) - Continue gabapentin  and Cymbalta   Depressive disorder Continue Cymbalta  and trazodone   BPH (benign prostatic hyperplasia) - Continue Flomax   OSA (obstructive sleep apnea) - Continue CPAP at bedtime  Essential hypertension - Cardizem  discontinued - Continue on Lopressor   Hyperlipidemia - On Lipitor at home  Anxiety - Continue Cymbalta  and trazodone   Type 2 diabetes mellitus (HCC) - On Jardiance  and semaglutide  - SSI while inpatient - Last A1c 6.7% on 01/18/2024    The patient's acute and chronic medical conditions were treated accordingly. On day of discharge, patient was felt deemed stable for discharge. Patient/family member advised to call PCP or come back to ER if needed.   Principal Diagnosis: Atrial flutter with rapid ventricular response [facility])  Discharge Diagnoses: Active Hospital Problems  Diagnosis Date Noted   Atrial flutter with rapid ventricular response (HCC) 02/01/2024    Priority: High   Diabetic neuropathy (HCC) 11/30/2023     Priority: Low   BPH (benign prostatic hyperplasia) 10/20/2023    Priority: Low   Hyperlipidemia 07/21/2022    Priority: Low   Essential hypertension 07/21/2022    Priority: Low   OSA (obstructive sleep apnea) 07/21/2022    Priority: Low   DDD (degenerative disc disease), cervical 10/28/2019    Priority: Low   Anxiety 07/16/2019    Priority: Low   Depressive disorder 07/03/2012    Priority: Low   Type 2 diabetes mellitus (HCC) 02/01/2024   Obesity, Class I, BMI 30-34.9 01/17/2024    Resolved Hospital Problems  No resolved problems to display.     Discharge Instructions     Diet - low sodium heart healthy   Complete by: As directed    Increase activity slowly   Complete by: As directed       Allergies as of 02/06/2024       Reactions   Metformin Diarrhea, Nausea And Vomiting   Oxycodone Other (See Comments)   Constipation   Oxycodone-acetaminophen     Constipation   Lisinopril Cough   Morphine Itching   Other reaction(s): Other (See Comments)  Constipation   Morphine And Codeine Itching        Medication List     STOP taking these medications    amLODipine  10 MG tablet Commonly known as: NORVASC    chlorthalidone 25 MG tablet Commonly known as: HYGROTON   diltiazem  360 MG 24 hr capsule Commonly known as: CARDIZEM  CD   dronedarone  400 MG tablet Commonly known as: MULTAQ    losartan 50 MG tablet Commonly known as: COZAAR       TAKE these medications    acetaminophen  325 MG tablet Commonly known as: TYLENOL  Take 1 tablet (325 mg total) by mouth every 6 (six) hours as needed for mild pain (or Fever >/= 101).   atorvastatin  10 MG tablet Commonly known as: LIPITOR Take 1 tablet by mouth daily.   BIOTIN PO Take 1 tablet by mouth daily.   cyclobenzaprine  10 MG tablet Commonly known as: FLEXERIL  Take 10 mg by mouth at bedtime.   DSS 100 MG Caps Take 1 capsule by mouth daily.   DULoxetine  60 MG capsule Commonly known as: CYMBALTA  Take 1  capsule by mouth daily.   Eliquis  5 MG Tabs tablet Generic drug: apixaban  Take 1 tablet (5 mg total) by mouth 2 (two) times daily.   gabapentin  300 MG capsule Commonly known as: NEURONTIN  Take 300-1,500 mg by mouth daily. Take 300mg  (1 capsule) by mouth in the mornings and 1500mg  (5 capsules) in the evening.   GaviLAX 17 GM/SCOOP powder Generic drug: polyethylene glycol powder Take 17 g by mouth daily as needed for mild constipation or moderate constipation.   Jardiance  10 MG Tabs tablet Generic drug: empagliflozin  Take 10 mg by mouth daily.   magnesium  30 MG tablet Take 30 mg by mouth in the morning.   methocarbamol  500 MG tablet Commonly known as: ROBAXIN  Take 1 tablet (500 mg total) by mouth every 8 (eight) hours as needed for muscle spasms.   metoprolol  tartrate 100 MG tablet Commonly known as: LOPRESSOR  Take 1 tablet (100 mg total) by mouth 2 (two) times daily.   multivitamin with minerals tablet Take 1 tablet by mouth daily.   Rybelsus  7 MG Tabs Generic drug: Semaglutide  Take 1 tablet by mouth daily.  senna 8.6 MG tablet Commonly known as: SENOKOT Take 1 tablet by mouth daily.   tamsulosin  0.4 MG Caps capsule Commonly known as: FLOMAX  Take 0.4 mg by mouth daily.   traZODone  50 MG tablet Commonly known as: DESYREL  Take 50 mg by mouth at bedtime.        Allergies  Allergen Reactions   Metformin Diarrhea and Nausea And Vomiting   Oxycodone Other (See Comments)    Constipation   Oxycodone-Acetaminophen      Constipation   Lisinopril Cough   Morphine Itching    Other reaction(s): Other (See Comments)  Constipation   Morphine And Codeine Itching    Consultations: Cardiology  Procedures: 8/18: TEE DCCV  Discharge Exam: BP 130/76 (BP Location: Right Arm)   Pulse 91   Temp 97.7 F (36.5 C) (Oral)   Resp 16   Ht 5' 10 (1.778 m)   Wt 108.9 kg   SpO2 92%   BMI 34.44 kg/m  Physical Exam Constitutional:      General: He is not in acute  distress.    Appearance: Normal appearance.  HENT:     Head: Normocephalic and atraumatic.     Mouth/Throat:     Mouth: Mucous membranes are moist.  Eyes:     Extraocular Movements: Extraocular movements intact.  Cardiovascular:     Rate and Rhythm: Normal rate and regular rhythm.  Pulmonary:     Effort: Pulmonary effort is normal. No respiratory distress.     Breath sounds: Normal breath sounds. No wheezing.  Abdominal:     General: Bowel sounds are normal. There is no distension.     Palpations: Abdomen is soft.     Tenderness: There is no abdominal tenderness.  Musculoskeletal:        General: Normal range of motion.     Cervical back: Normal range of motion and neck supple.  Skin:    General: Skin is warm and dry.  Neurological:     General: No focal deficit present.     Mental Status: He is alert.  Psychiatric:        Mood and Affect: Mood normal.        Behavior: Behavior normal.      The results of significant diagnostics from this hospitalization (including imaging, microbiology, ancillary and laboratory) are listed below for reference.   Microbiology: Recent Results (from the past 240 hours)  Resp panel by RT-PCR (RSV, Flu A&B, Covid) Anterior Nasal Swab     Status: None   Collection Time: 02/01/24  4:03 PM   Specimen: Anterior Nasal Swab  Result Value Ref Range Status   SARS Coronavirus 2 by RT PCR NEGATIVE NEGATIVE Final    Comment: (NOTE) SARS-CoV-2 target nucleic acids are NOT DETECTED.  The SARS-CoV-2 RNA is generally detectable in upper respiratory specimens during the acute phase of infection. The lowest concentration of SARS-CoV-2 viral copies this assay can detect is 138 copies/mL. A negative result does not preclude SARS-Cov-2 infection and should not be used as the sole basis for treatment or other patient management decisions. A negative result may occur with  improper specimen collection/handling, submission of specimen other than nasopharyngeal  swab, presence of viral mutation(s) within the areas targeted by this assay, and inadequate number of viral copies(<138 copies/mL). A negative result must be combined with clinical observations, patient history, and epidemiological information. The expected result is Negative.  Fact Sheet for Patients:  BloggerCourse.com  Fact Sheet for Healthcare Providers:  SeriousBroker.it  This test  is no t yet approved or cleared by the [place]  FDA and  has been authorized for detection and/or diagnosis of SARS-CoV-2 by FDA under an Emergency Use Authorization (EUA). This EUA will remain  in effect (meaning this test can be used) for the duration of the COVID-19 declaration under Section 564(b)(1) of the Act, 21 U.S.C.section 360bbb-3(b)(1), unless the authorization is terminated  or revoked sooner.       Influenza A by PCR NEGATIVE NEGATIVE Final   Influenza B by PCR NEGATIVE NEGATIVE Final    Comment: (NOTE) The Xpert Xpress SARS-CoV-2/FLU/RSV plus assay is intended as an aid in the diagnosis of influenza from Nasopharyngeal swab specimens and should not be used as a sole basis for treatment. Nasal washings and aspirates are unacceptable for Xpert Xpress SARS-CoV-2/FLU/RSV testing.  Fact Sheet for Patients: BloggerCourse.com  Fact Sheet for Healthcare Providers: SeriousBroker.it  This test is not yet approved or cleared by the [place]  FDA and has been authorized for detection and/or diagnosis of SARS-CoV-2 by FDA under an Emergency Use Authorization (EUA). This EUA will remain in effect (meaning this test can be used) for the duration of the COVID-19 declaration under Section 564(b)(1) of the Act, 21 U.S.C. section 360bbb-3(b)(1), unless the authorization is terminated or revoked.     Resp Syncytial Virus by PCR NEGATIVE NEGATIVE Final    Comment: (NOTE) Fact Sheet for  Patients: BloggerCourse.com  Fact Sheet for Healthcare Providers: SeriousBroker.it  This test is not yet approved or cleared by the [place]  FDA and has been authorized for detection and/or diagnosis of SARS-CoV-2 by FDA under an Emergency Use Authorization (EUA). This EUA will remain in effect (meaning this test can be used) for the duration of the COVID-19 declaration under Section 564(b)(1) of the Act, 21 U.S.C. section 360bbb-3(b)(1), unless the authorization is terminated or revoked.  Performed at [facility], [address], [place], [place] [address]   C Difficile Quick Screen w PCR reflex     Status: None   Collection Time: 02/02/24 12:33 AM   Specimen: STOOL  Result Value Ref Range Status   C Diff antigen NEGATIVE NEGATIVE Final   C Diff toxin NEGATIVE NEGATIVE Final   C Diff interpretation No C. difficile detected.  Final    Comment: Performed at [facility], [address], [place], [place] [address]     Labs: BNP (last 3 results) Recent Labs    01/13/24 0305 02/01/24 1535  BNP 72.7 31.9   Basic Metabolic Panel: Recent Labs  Lab 02/01/24 1535 02/02/24 0200 02/04/24 0520 02/05/24 0436  NA 138 136 137 134*  K 3.6 3.2* 3.2* 3.6  CL 105 103 104 102  CO2 22 25 23 23   GLUCOSE 125* 149* 148* 171*  BUN 9 10 12 16   CREATININE 0.57* 0.63 0.59* 0.68  CALCIUM  9.0 8.2* 8.8* 8.6*  MG  --  1.7 1.6* 1.7   Liver Function Tests: No results for input(s): AST, ALT, ALKPHOS, BILITOT, PROT, ALBUMIN in the last 168 hours. No results for input(s): LIPASE, AMYLASE in the last 168 hours. No results for input(s): AMMONIA in the last 168 hours. CBC: Recent Labs  Lab 02/01/24 1535 02/02/24 0531  WBC 9.1 10.8*  NEUTROABS 6.3  --   HGB 14.1 13.2  HCT 47.6 41.9  MCV 85.0 82.6  PLT 288 337   Cardiac Enzymes: No results for input(s): CKTOTAL, CKMB,  CKMBINDEX, TROPONINI in the last 168 hours. BNP: Invalid input(s): POCBNP  CBG: Recent Labs  Lab 02/05/24 1210 02/05/24 1655 02/05/24 2043 02/06/24 0741 02/06/24 1134  GLUCAP 156* 183* 156* 132* 186*   D-Dimer No results for input(s): DDIMER in the last 72 hours. Hgb A1c No results for input(s): HGBA1C in the last 72 hours. Lipid Profile Recent Labs    02/04/24 0520  CHOL 93  HDL 26*  LDLCALC 49  TRIG 90  CHOLHDL 3.6   Thyroid  function studies No results for input(s): TSH, T4TOTAL, T3FREE, THYROIDAB in the last 72 hours.  Invalid input(s): FREET3 Anemia work up No results for input(s): VITAMINB12, FOLATE, FERRITIN, TIBC, IRON, RETICCTPCT in the last 72 hours. Urinalysis    Component Value Date/Time   COLORURINE YELLOW 02/02/2024 0650   APPEARANCEUR CLEAR 02/02/2024 0650   LABSPEC >1.046 (H) 02/02/2024 0650   PHURINE 6.0 02/02/2024 0650   GLUCOSEU >=500 (A) 02/02/2024 0650   HGBUR NEGATIVE 02/02/2024 0650   BILIRUBINUR NEGATIVE 02/02/2024 0650   KETONESUR 5 (A) 02/02/2024 0650   PROTEINUR NEGATIVE 02/02/2024 0650   NITRITE NEGATIVE 02/02/2024 0650   LEUKOCYTESUR NEGATIVE 02/02/2024 0650   Sepsis Labs Recent Labs  Lab 02/01/24 1535 02/02/24 0531  WBC 9.1 10.8*   Microbiology Recent Results (from the past 240 hours)  Resp panel by RT-PCR (RSV, Flu A&B, Covid) Anterior Nasal Swab     Status: None   Collection Time: 02/01/24  4:03 PM   Specimen: Anterior Nasal Swab  Result Value Ref Range Status   SARS Coronavirus 2 by RT PCR NEGATIVE NEGATIVE Final    Comment: (NOTE) SARS-CoV-2 target nucleic acids are NOT DETECTED.  The SARS-CoV-2 RNA is generally detectable in upper respiratory specimens during the acute phase of infection. The lowest concentration of SARS-CoV-2 viral copies this assay can detect is 138 copies/mL. A negative result does not preclude SARS-Cov-2 infection and should not be used as the sole basis for  treatment or other patient management decisions. A negative result may occur with  improper specimen collection/handling, submission of specimen other than nasopharyngeal swab, presence of viral mutation(s) within the areas targeted by this assay, and inadequate number of viral copies(<138 copies/mL). A negative result must be combined with clinical observations, patient history, and epidemiological information. The expected result is Negative.  Fact Sheet for Patients:  BloggerCourse.com  Fact Sheet for Healthcare Providers:  SeriousBroker.it  This test is no t yet approved or cleared by the [place]  FDA and  has been authorized for detection and/or diagnosis of SARS-CoV-2 by FDA under an Emergency Use Authorization (EUA). This EUA will remain  in effect (meaning this test can be used) for the duration of the COVID-19 declaration under Section 564(b)(1) of the Act, 21 U.S.C.section 360bbb-3(b)(1), unless the authorization is terminated  or revoked sooner.       Influenza A by PCR NEGATIVE NEGATIVE Final   Influenza B by PCR NEGATIVE NEGATIVE Final    Comment: (NOTE) The Xpert Xpress SARS-CoV-2/FLU/RSV plus assay is intended as an aid in the diagnosis of influenza from Nasopharyngeal swab specimens and should not be used as a sole basis for treatment. Nasal washings and aspirates are unacceptable for Xpert Xpress SARS-CoV-2/FLU/RSV testing.  Fact Sheet for Patients: BloggerCourse.com  Fact Sheet for Healthcare Providers: SeriousBroker.it  This test is not yet approved or cleared by the [place]  FDA and has been authorized for detection and/or diagnosis of SARS-CoV-2 by FDA under an Emergency Use Authorization (EUA). This EUA will remain in effect (meaning this test can be used) for the duration  of the COVID-19 declaration under Section 564(b)(1) of the Act, 21  U.S.C. section 360bbb-3(b)(1), unless the authorization is terminated or revoked.     Resp Syncytial Virus by PCR NEGATIVE NEGATIVE Final    Comment: (NOTE) Fact Sheet for Patients: BloggerCourse.com  Fact Sheet for Healthcare Providers: SeriousBroker.it  This test is not yet approved or cleared by the [place]  FDA and has been authorized for detection and/or diagnosis of SARS-CoV-2 by FDA under an Emergency Use Authorization (EUA). This EUA will remain in effect (meaning this test can be used) for the duration of the COVID-19 declaration under Section 564(b)(1) of the Act, 21 U.S.C. section 360bbb-3(b)(1), unless the authorization is terminated or revoked.  Performed at [facility], [address] [place], [place] [address]   C Difficile Quick Screen w PCR reflex     Status: None   Collection Time: 02/02/24 12:33 AM   Specimen: STOOL  Result Value Ref Range Status   C Diff antigen NEGATIVE NEGATIVE Final   C Diff toxin NEGATIVE NEGATIVE Final   C Diff interpretation No C. difficile detected.  Final    Comment: Performed at [facility], [address] [place], [place] [address]    Procedures/Studies: ECHO TEE Result Date: 02/05/2024    TRANSESOPHOGEAL ECHO REPORT   Patient Name:   Shia Delaine HURMAN KETELSEN Date of Exam: 02/05/2024 Medical Rec #:  [id number]        Height:       70.0 in Accession #:    [id number]       Weight:       240.0 lb Date of Birth:  [birth year]         BSA:          2.255 m Patient Age:    68 years         BP:           145/91 mmHg Patient Gender: M                HR:           104 bpm. Exam Location:  Inpatient Procedure: Transesophageal Echo, Color Doppler, Cardiac Doppler and Saline            Contrast Bubble Study (Both Spectral and Color Flow Doppler were            utilized during procedure). Indications:     I48.91* Unspecified atrial fibrillation  History:          Patient has prior history of Echocardiogram examinations, most                  recent 01/13/2024. Risk Factors:Hypertension, Diabetes,                  Dyslipidemia and Sleep Apnea.  Sonographer:     Damien Senior RDCS Referring Phys:  [id number] JON GARRE DUKE Diagnosing Phys: Jerel Balding MD PROCEDURE: After discussion of the risks and benefits of a TEE, an informed consent was obtained from the patient. The transesophogeal probe was passed without difficulty through the esophogus of the patient. Sedation performed by different physician. The patient was monitored while under deep sedation. Anesthestetic sedation was provided intravenously by Anesthesiology: 154mg  of Propofol . The patient developed no complications during the procedure. A successful direct current cardioversion was performed at 200 joules with 1 attempt.  IMPRESSIONS  1. Left ventricular ejection fraction, by estimation, is 55 to 60%. The left ventricle has normal function. The  left ventricle has no regional wall motion abnormalities. Left ventricular diastolic function could not be evaluated.  2. Right ventricular systolic function is normal. The right ventricular size is normal.  3. Left atrial size was severely dilated. No left atrial/left atrial appendage thrombus was detected. The LAA emptying velocity was 46 cm/s.  4. Right atrial size was mildly dilated.  5. The mitral valve is normal in structure. Trivial mitral valve regurgitation.  6. The aortic valve is tricuspid. There is mild calcification of the aortic valve. There is mild thickening of the aortic valve. Aortic valve regurgitation is not visualized. Aortic valve sclerosis is present, with no evidence of aortic valve stenosis.  7. Agitated saline contrast bubble study was negative, with no evidence of any interatrial shunt. Comparison(s): The moderate pericardial effusion is no longer present. FINDINGS  Left Ventricle: Left ventricular ejection fraction, by estimation, is 55 to 60%.  The left ventricle has normal function. The left ventricle has no regional wall motion abnormalities. The left ventricular internal cavity size was normal in size. There is  no concentric left ventricular hypertrophy. Left ventricular diastolic function could not be evaluated due to atrial fibrillation. Left ventricular diastolic function could not be evaluated. Right Ventricle: The right ventricular size is normal. No increase in right ventricular wall thickness. Right ventricular systolic function is normal. Left Atrium: Left atrial size was severely dilated. No left atrial/left atrial appendage thrombus was detected. The LAA emptying velocity was 46 cm/s. Right Atrium: Right atrial size was mildly dilated. Pericardium: Trivial pericardial effusion is present. Mitral Valve: The mitral valve is normal in structure. Trivial mitral valve regurgitation. Tricuspid Valve: The tricuspid valve is normal in structure. Tricuspid valve regurgitation is trivial. Aortic Valve: The aortic valve is tricuspid. There is mild calcification of the aortic valve. There is mild thickening of the aortic valve. Aortic valve regurgitation is not visualized. Aortic valve sclerosis is present, with no evidence of aortic valve stenosis. Pulmonic Valve: The pulmonic valve was normal in structure. Pulmonic valve regurgitation is trivial. Aorta: The aortic root and ascending aorta are structurally normal, with no evidence of dilitation. IAS/Shunts: No atrial level shunt detected by color flow Doppler. Agitated saline contrast was given intravenously to evaluate for intracardiac shunting. Agitated saline contrast bubble study was negative, with no evidence of any interatrial shunt. Additional Comments: Spectral Doppler performed. Jerel Balding MD Electronically signed by Jerel Balding MD Signature Date/Time: 02/05/2024/11:48:29 AM    Final    EP STUDY Result Date: 02/05/2024 See surgical note for result.  CT Angio Chest Pulmonary Embolism  (PE) W or WO Contrast Result Date: 02/01/2024 CLINICAL DATA:  tachycardia, sob EXAM: CT ANGIOGRAPHY CHEST WITH CONTRAST TECHNIQUE: Multidetector CT imaging of the chest was performed using the standard protocol during bolus administration of intravenous contrast. Multiplanar CT image reconstructions and MIPs were obtained to evaluate the vascular anatomy. RADIATION DOSE REDUCTION: This exam was performed according to the departmental dose-optimization program which includes automated exposure control, adjustment of the mA and/or kV according to patient size and/or use of iterative reconstruction technique. CONTRAST:  75mL OMNIPAQUE  IOHEXOL  350 MG/ML SOLN COMPARISON:  February 01, 2024, January 13, 2024 FINDINGS: Pulmonary Embolism: No pulmonary embolism. Cardiovascular: Borderline cardiomegaly. Unchanged small pericardial effusion.Dense multi-vessel coronary atherosclerosis.No aortic aneurysm. Diffuse aortic atherosclerosis. Mediastinum/Nodes: No mediastinal mass. No mediastinal, hilar, or axillary lymphadenopathy. Lungs/Pleura: The midline trachea and bronchi are patent. Trace bilateral pleural effusions. Posterior bibasilar dependent atelectasis. Elevation of the right hemidiaphragm. No focal airspace consolidation or pneumothorax. Musculoskeletal:  No acute fracture or destructive bone lesion. Multiple, healed remote left-sided rib fractures. Multilevel degenerative disc disease of the spine. Thoracic DISH. Osteopenia. Small volume symmetric bilateral gynecomastia. Upper Abdomen: No acute abnormality in the partially visualized upper abdomen. Small nonobstructive bilateral nephrolithiasis. Review of the MIP images confirms the above findings. IMPRESSION: 1. Trace bilateral pleural effusions with bibasilar atelectasis. No pneumonia or pulmonary edema. 2. No pulmonary embolism. 3. Nonobstructive small bilateral nephrolithiasis. No visualized nephrolithiasis. Aortic Atherosclerosis (ICD10-I70.0). Electronically Signed    By: Rogelia Myers M.D.   On: 02/01/2024 20:08   DG Chest 1 View Result Date: 02/01/2024 CLINICAL DATA:  Shortness of breath.  Tachycardia. EXAM: CHEST  1 VIEW COMPARISON:  01/13/2024 FINDINGS: The left lung base and inferior heart are not included on today's image. The included lungs are clear and the cardiac silhouette remains borderline enlarged. Aortic arch calcifications. Mild left glenohumeral degenerative changes. Cervical spine fixation hardware. IMPRESSION: No acute abnormality. The left lung base and inferior heart are not included on today's image. Electronically Signed   By: Elspeth Bathe M.D.   On: 02/01/2024 16:47   ECHOCARDIOGRAM COMPLETE Result Date: 01/13/2024    ECHOCARDIOGRAM REPORT   Patient Name:   Alan Lin Date of Exam: 01/13/2024 Medical Rec #:  [id number]        Height:       70.0 in Accession #:    [id number]       Weight:       240.0 lb Date of Birth:  [birth year]         BSA:          2.255 m Patient Age:    68 years         BP:           109/58 mmHg Patient Gender: M                HR:           142 bpm. Exam Location:  Inpatient Procedure: 2D Echo, Cardiac Doppler, Color Doppler and Intracardiac            Opacification Agent (Both Spectral and Color Flow Doppler were            utilized during procedure). Indications:    I48.91* Unspeicified atrial fibrillation  History:        Patient has prior history of Echocardiogram examinations, most                 recent 07/22/2022. Abnormal ECG, Arrythmias:Atrial Fibrillation,                 Signs/Symptoms:Syncope; Risk Factors:Diabetes, Dyslipidemia and                 Sleep Apnea.  Sonographer:    Ellouise Mose RDCS Referring Phys: [id number] Tyqwan Pink MANUEL ORTIZ  Sonographer Comments: Technically difficult study due to poor echo windows. Image acquisition challenging due to patient body habitus and Image acquisition challenging due to respiratory motion. Extremely difficult study. Off axis apicals IMPRESSIONS  1. Left ventricular ejection  fraction, by estimation, is 70 to 75%. Left ventricular ejection fraction by 2D MOD biplane is 72.6 %. The left ventricle has hyperdynamic function. The left ventricle has no regional wall motion abnormalities. There is mild  left ventricular hypertrophy. Left ventricular diastolic parameters are indeterminate.  2. Right ventricular systolic function is normal. The right ventricular size is mildly enlarged. Tricuspid regurgitation signal is inadequate for assessing PA pressure.  3.  Moderate pericardial effusion, more adjacent to the RV - the IVC does not collapse, but there is no RA or RV collapse or exaggerated inflow variation to suggest tamponade - clinical correlation is advised, especially given the elevated HR. Moderate pericardial effusion. The pericardial effusion is circumferential. There is no evidence of cardiac tamponade.  4. The mitral valve is grossly normal. Trivial mitral valve regurgitation. No evidence of mitral stenosis. Moderate mitral annular calcification.  5. The aortic valve is tricuspid. Aortic valve regurgitation is not visualized. Aortic valve sclerosis is present, with no evidence of aortic valve stenosis.  6. The inferior vena cava is dilated in size with <50% respiratory variability, suggesting right atrial pressure of 15 mmHg. Comparison(s): Changes from prior study are noted. 07/22/2022: LVEF 60-65% (technically difficult study, no pericardial effusion noted). A new pericardial effuison is noted. Conclusion(s)/Recommendation(s): Consider limited echo to re-assess effusion in 2-3 days. FINDINGS  Left Ventricle: Left ventricular ejection fraction, by estimation, is 70 to 75%. Left ventricular ejection fraction by 2D MOD biplane is 72.6 %. The left ventricle has hyperdynamic function. The left ventricle has no regional wall motion abnormalities. Definity  contrast agent was given IV to delineate the left ventricular endocardial borders. The left ventricular internal cavity size was normal  in size. There is mild left ventricular hypertrophy. Left ventricular diastolic parameters are indeterminate. Right Ventricle: The right ventricular size is mildly enlarged. No increase in right ventricular wall thickness. Right ventricular systolic function is normal. Tricuspid regurgitation signal is inadequate for assessing PA pressure. Left Atrium: Left atrial size was normal in size. Right Atrium: Right atrial size was normal in size. Pericardium: Moderate pericardial effusion, more adjacent to the RV - the IVC does not collapse, but there is no RA or RV collapse or exaggerated inflow variation to suggest tamponade - clinical correlation is advised, especially given the elevated HR. A  moderately sized pericardial effusion is present. The pericardial effusion is circumferential. There is no evidence of cardiac tamponade. Mitral Valve: The mitral valve is grossly normal. Moderate mitral annular calcification. Trivial mitral valve regurgitation. No evidence of mitral valve stenosis. Tricuspid Valve: The tricuspid valve is normal in structure. Tricuspid valve regurgitation is trivial. No evidence of tricuspid stenosis. Aortic Valve: The aortic valve is tricuspid. Aortic valve regurgitation is not visualized. Aortic valve sclerosis is present, with no evidence of aortic valve stenosis. Pulmonic Valve: The pulmonic valve was normal in structure. Pulmonic valve regurgitation is not visualized. No evidence of pulmonic stenosis. Aorta: The aortic root and ascending aorta are structurally normal, with no evidence of dilitation. Venous: The inferior vena cava is dilated in size with less than 50% respiratory variability, suggesting right atrial pressure of 15 mmHg. IAS/Shunts: No atrial level shunt detected by color flow Doppler.  LEFT VENTRICLE PLAX 2D                        Biplane EF (MOD) LVIDd:         4.70 cm         LV Biplane EF:   Left LVIDs:         3.30 cm                          ventricular LV PW:          1.10 cm  ejection LV IVS:        0.90 cm                          fraction by LVOT diam:     2.40 cm                          2D MOD LV SV:         36                               biplane is LV SV Index:   16                               72.6 %. LVOT Area:     4.52 cm  LV Volumes (MOD) LV vol d, MOD    85.8 ml A2C: LV vol d, MOD    79.9 ml A4C: LV vol s, MOD    21.8 ml A2C: LV vol s, MOD    20.3 ml A4C: LV SV MOD A2C:   64.0 ml LV SV MOD A4C:   79.9 ml LV SV MOD BP:    62.1 ml RIGHT VENTRICLE             IVC RV S prime:     12.20 cm/s  IVC diam: 2.50 cm TAPSE (M-mode): 1.3 cm LEFT ATRIUM           Index        RIGHT ATRIUM           Index LA diam:      4.40 cm 1.95 cm/m   RA Area:     10.20 cm LA Vol (A2C): 40.8 ml 18.09 ml/m  RA Volume:   18.00 ml  7.98 ml/m LA Vol (A4C): 24.8 ml 11.00 ml/m  AORTIC VALVE LVOT Vmax:   70.60 cm/s LVOT Vmean:  43.800 cm/s LVOT VTI:    0.080 m  AORTA Ao Root diam: 3.40 cm Ao Asc diam:  3.40 cm MITRAL VALVE MV Area (PHT): 6.17 cm    SHUNTS MV Decel Time: 123 msec    Systemic VTI:  0.08 m MV E velocity: 69.50 cm/s  Systemic Diam: 2.40 cm MV A velocity: 48.10 cm/s MV E/A ratio:  1.44 Vinie Maxcy MD Electronically signed by Vinie Maxcy MD Signature Date/Time: 01/13/2024/1:56:46 PM    Final    DG Chest Port 1 View Result Date: 01/13/2024 CLINICAL DATA:  Atrial fibrillation with rapid ventricular response EXAM: PORTABLE CHEST 1 VIEW COMPARISON:  None Available. FINDINGS: Low lung volumes. No pulmonary edema, pleural effusion or pneumothorax. No focal airspace infiltrate. Calcifications present in the transverse aorta consistent with aortic atherosclerotic vascular calcifications. IMPRESSION: Low lung volumes without evidence of acute cardiopulmonary process. Aortic Atherosclerosis (ICD10-I70.0). Electronically Signed   By: Wilkie Lent M.D.   On: 01/13/2024 08:26   DG Knee 2 Views Right Result Date: 01/13/2024 CLINICAL DATA:  Right knee pain  EXAM: RIGHT KNEE - 1-2 VIEW COMPARISON:  None Available. FINDINGS: Frontal lateral radiographs are slightly limited by obliquity. Normal alignment. No acute fracture or dislocation. Joint spaces appear preserved on this limited examination. No effusion. Advanced vascular calcification. IMPRESSION: 1. No acute fracture or dislocation. 2. Advanced vascular calcification. Electronically Signed   By: Dorethia Molt M.D.   On:  01/13/2024 02:16     Time coordinating discharge: Over 30 minutes    Alm Apo, MD  [facility] 02/06/2024, 2:17 PM

## 2024-02-07 ENCOUNTER — Telehealth: Payer: Self-pay | Admitting: Nurse Practitioner

## 2024-02-08 ENCOUNTER — Emergency Department (HOSPITAL_COMMUNITY)

## 2024-02-08 ENCOUNTER — Other Ambulatory Visit: Payer: Self-pay

## 2024-02-08 ENCOUNTER — Encounter (HOSPITAL_COMMUNITY): Payer: Self-pay

## 2024-02-08 ENCOUNTER — Emergency Department (HOSPITAL_COMMUNITY)
Admission: EM | Admit: 2024-02-08 | Discharge: 2024-02-08 | Disposition: A | Discharge status: Home / Self Care | Attending: Emergency Medicine | Admitting: Emergency Medicine

## 2024-02-08 DIAGNOSIS — Z7901 Long term (current) use of anticoagulants: Secondary | ICD-10-CM | POA: Diagnosis not present

## 2024-02-08 DIAGNOSIS — I471 Supraventricular tachycardia, unspecified: Secondary | ICD-10-CM | POA: Insufficient documentation

## 2024-02-08 DIAGNOSIS — R000 Tachycardia, unspecified: Secondary | ICD-10-CM | POA: Diagnosis present

## 2024-02-08 DIAGNOSIS — I10 Essential (primary) hypertension: Secondary | ICD-10-CM | POA: Diagnosis not present

## 2024-02-08 DIAGNOSIS — I2510 Atherosclerotic heart disease of native coronary artery without angina pectoris: Secondary | ICD-10-CM | POA: Diagnosis not present

## 2024-02-08 DIAGNOSIS — Z79899 Other long term (current) drug therapy: Secondary | ICD-10-CM | POA: Diagnosis not present

## 2024-02-08 DIAGNOSIS — Z7984 Long term (current) use of oral hypoglycemic drugs: Secondary | ICD-10-CM | POA: Insufficient documentation

## 2024-02-08 DIAGNOSIS — E119 Type 2 diabetes mellitus without complications: Secondary | ICD-10-CM | POA: Diagnosis not present

## 2024-02-08 LAB — BASIC METABOLIC PANEL WITH GFR
Anion gap: 13 (ref 5–15)
BUN: 11 mg/dL (ref 8–23)
CO2: 18 mmol/L — ABNORMAL LOW (ref 22–32)
Calcium: 8.6 mg/dL — ABNORMAL LOW (ref 8.9–10.3)
Chloride: 108 mmol/L (ref 98–111)
Creatinine, Ser: 0.84 mg/dL (ref 0.61–1.24)
GFR, Estimated: >60 mL/min (ref >60)
Glucose, Bld: 170 mg/dL — ABNORMAL HIGH (ref 70–99)
Potassium: 3.3 mmol/L — ABNORMAL LOW (ref 3.5–5.1)
Sodium: 139 mmol/L (ref 135–145)

## 2024-02-08 LAB — CBC
HCT: 41.8 % (ref 39.0–52.0)
Hemoglobin: 12.7 g/dL — ABNORMAL LOW (ref 13.0–17.0)
MCH: 25.9 pg — ABNORMAL LOW (ref 26.0–34.0)
MCHC: 30.4 g/dL (ref 30.0–36.0)
MCV: 85.1 fL (ref 80.0–100.0)
Platelets: 268 K/uL (ref 150–400)
RBC: 4.91 MIL/uL (ref 4.22–5.81)
RDW: 14.5 % (ref 11.5–15.5)
WBC: 11.4 K/uL — ABNORMAL HIGH (ref 4.0–10.5)
nRBC: 0 % (ref 0.0–0.2)

## 2024-02-08 LAB — MAGNESIUM: Magnesium: 1.5 mg/dL — ABNORMAL LOW (ref 1.7–2.4)

## 2024-02-08 MED ORDER — POTASSIUM CHLORIDE CRYS ER 20 MEQ PO TBCR
40.0000 meq | EXTENDED_RELEASE_TABLET | Freq: Once | ORAL | Status: AC
  Administered 2024-02-08: 40 meq via ORAL
  Filled 2024-02-08: qty 2

## 2024-02-08 MED ORDER — DILTIAZEM LOAD VIA INFUSION: 20.0000 mg | Freq: Once | INTRAVENOUS | Status: DC

## 2024-02-08 MED ORDER — SODIUM CHLORIDE 0.9 % IV BOLUS
500.0000 mL | Freq: Once | INTRAVENOUS | Status: AC
  Administered 2024-02-08: 500 mL via INTRAVENOUS

## 2024-02-08 MED ORDER — MAGNESIUM SULFATE 2 GM/50ML IV SOLN
2.0000 g | Freq: Once | INTRAVENOUS | Status: AC
  Administered 2024-02-08: 2 g via INTRAVENOUS
  Filled 2024-02-08: qty 50

## 2024-02-08 MED ORDER — DILTIAZEM HCL 25 MG/5ML IV SOLN
INTRAVENOUS | Status: AC
  Filled 2024-02-08: qty 5

## 2024-02-08 MED ORDER — METOPROLOL TARTRATE 25 MG PO TABS: 25.0000 mg | ORAL_TABLET | Freq: Once | ORAL | Status: DC

## 2024-02-08 MED ORDER — METOPROLOL TARTRATE 100 MG PO TABS: 150.0000 mg | ORAL_TABLET | Freq: Two times a day (BID) | ORAL | Status: DC

## 2024-02-08 MED ORDER — FLECAINIDE ACETATE 100 MG PO TABS
100.0000 mg | ORAL_TABLET | Freq: Once | ORAL | Status: AC
  Administered 2024-02-08: 100 mg via ORAL
  Filled 2024-02-08: qty 1

## 2024-02-08 MED ORDER — FLECAINIDE ACETATE 100 MG PO TABS: 100.0000 mg | ORAL_TABLET | Freq: Two times a day (BID) | ORAL | 0 refills | Status: AC

## 2024-02-08 MED ORDER — METOPROLOL TARTRATE 100 MG PO TABS: 150.0000 mg | ORAL_TABLET | Freq: Two times a day (BID) | ORAL | Status: AC

## 2024-02-08 MED ORDER — METOPROLOL TARTRATE 25 MG PO TABS
50.0000 mg | ORAL_TABLET | Freq: Once | ORAL | Status: AC
  Administered 2024-02-08: 50 mg via ORAL
  Filled 2024-02-08: qty 2

## 2024-02-08 MED ORDER — DILTIAZEM HCL 25 MG/5ML IV SOLN
20.0000 mg | Freq: Once | INTRAVENOUS | Status: AC
  Administered 2024-02-08: 20 mg via INTRAVENOUS

## 2024-02-08 NOTE — ED Notes (Signed)
 [facility] called at this time for pt transport

## 2024-02-08 NOTE — ED Notes (Signed)
EKG given to Dr. Nanavati. 

## 2024-02-08 NOTE — ED Provider Notes (Signed)
  Physical Exam  BP 107/72   Pulse (!) 105   Temp 98.1 F (36.7 C) (Oral)   Resp 18   SpO2 94%   Physical Exam Vitals and nursing note reviewed.  HENT:     Head: Normocephalic and atraumatic.  Eyes:     Pupils: Pupils are equal, round, and reactive to light.  Cardiovascular:     Rate and Rhythm: Normal rate and regular rhythm.  Pulmonary:     Effort: Pulmonary effort is normal.     Breath sounds: Normal breath sounds.  Abdominal:     Palpations: Abdomen is soft.     Tenderness: There is no abdominal tenderness.  Skin:    General: Skin is warm and dry.  Neurological:     Mental Status: He is alert.  Psychiatric:        Mood and Affect: Mood normal.     Procedures  Procedures  ED Course / MDM   Clinical Course as of 02/08/24 1624  Thu Feb 08, 2024  1623 Patient remained stable in normal sinus rhythm.  Will discharge with instructions and prescription for flecainide  as well as increased dosing of metoprolol .  He will follow-up with cardiology in the office.  Return precautions will be worrisome for recurrent A-fib with RVR were discussed in detail. [MP]    Clinical Course User Index [MP] Pamella Ozell LABOR, DO   Medical Decision Making I, Ozell Pamella DO, have assumed care of this patient from the previous provider pending reevaluation at 1600.  If patient remains in normal sinus rhythm he would be appropriate for discharge with increased dosing of metoprolol  and new prescription for flecainide  as directed by cardiology.  Amount and/or Complexity of Data Reviewed Labs: ordered. Radiology: ordered.  Risk Prescription drug management.          Pamella Ozell LABOR, DO 02/08/24 1624

## 2024-02-08 NOTE — ED Triage Notes (Signed)
 Pt BIB [facility] after PT for SVT. Pt symptomatic, hx of afib.

## 2024-02-08 NOTE — Discharge Instructions (Addendum)
 You were seen in the emergency room for elevated heart rate, thought to be either PSVT or a flutter.  In the ER, we were able to convert you back into normal rhythm. We discussed your case with cardiologist.  They recommend that you start flecainide  100 mg twice a day as a new medication. They want you to increase your metoprolol  to 150 mg twice a day.  Please return to the ER if your symptoms get worse.  Please follow-up with cardiology service in 10 to 14 days.

## 2024-02-08 NOTE — ED Provider Notes (Addendum)
 [place] EMERGENCY DEPARTMENT AT [facility] Provider Note   CSN: [id number] Arrival date & time: 02/08/24  1227     Patient presents with: Tachycardia   Alan Lin is a 68 y.o. male.   HPI     68 year old male comes in with chief complaint of SVT. Patient has history of paroxysmal A-fib, PSVT, nonobstructive CAD, hypertension, hyperlipidemia, diabetes.  He comes to the emergency room because of tachycardia.  Patient currently resides at independent living facility.  He was getting physical therapy.  Physical therapist noted the patient's heart rate was in the 200s.  His blood pressure was in the 90s.  Patient denies any chest pain, shortness of breath, palpitations, dizziness or near fainting.  He states that his activity level has been normal the last few days.  Patient was just discharged from the hospital on 8-19 however, after being cardioverted.  It also appears that his long-acting diltiazem  was discontinued.  Patient states that he has been taking his medications as prescribed including Eliquis .  Per EMS, they tried 6 mg followed by 12 mg and another 12 mg of adenosine.  Patient will break chest for few seconds.  Prior to Admission medications   Medication Sig Start Date End Date Taking? Authorizing Provider  acetaminophen  (TYLENOL ) 325 MG tablet Take 1 tablet (325 mg total) by mouth every 6 (six) hours as needed for mild pain (or Fever >/= 101). Patient not taking: Reported on 12/13/2023 07/28/22   Elgergawy, Brayton RAMAN, MD  apixaban  (ELIQUIS ) 5 MG TABS tablet Take 1 tablet (5 mg total) by mouth 2 (two) times daily. 02/06/24   Patsy Alm, MD  atorvastatin  (LIPITOR) 10 MG tablet Take 1 tablet by mouth daily. 10/12/23 10/06/24  [provider]  BIOTIN PO Take 1 tablet by mouth daily.    [provider]  cyclobenzaprine  (FLEXERIL ) 10 MG tablet Take 10 mg by mouth at bedtime.    [provider]  Docusate Sodium  (DSS) 100 MG CAPS Take 1  capsule by mouth daily. 10/12/23 10/11/24  [provider]  DULoxetine  (CYMBALTA ) 60 MG capsule Take 1 capsule by mouth daily. 10/12/23   [provider]  gabapentin  (NEURONTIN ) 300 MG capsule Take 300-1,500 mg by mouth daily. Take 300mg  (1 capsule) by mouth in the mornings and 1500mg  (5 capsules) in the evening. 08/03/16   [provider]  GAVILAX 17 GM/SCOOP powder Take 17 g by mouth daily as needed for mild constipation or moderate constipation. 10/01/23   [provider]  JARDIANCE  10 MG TABS tablet Take 10 mg by mouth daily. 08/09/22   [provider]  magnesium  30 MG tablet Take 30 mg by mouth in the morning.    [provider]  methocarbamol  (ROBAXIN ) 500 MG tablet Take 1 tablet (500 mg total) by mouth every 8 (eight) hours as needed for muscle spasms. 01/18/24 01/07/25  Patsy Alm, MD  metoprolol  tartrate (LOPRESSOR ) 100 MG tablet Take 1 tablet (100 mg total) by mouth 2 (two) times daily. 01/19/24   Patsy Alm, MD  Multiple Vitamins-Minerals (MULTIVITAMIN WITH MINERALS) tablet Take 1 tablet by mouth daily.    [provider]  Semaglutide  (RYBELSUS ) 7 MG TABS Take 1 tablet by mouth daily.    [provider]  senna (SENOKOT) 8.6 MG tablet Take 1 tablet by mouth daily. 10/12/23 10/11/24  [provider]  tamsulosin  (FLOMAX ) 0.4 MG CAPS capsule Take 0.4 mg by mouth daily. 09/28/22   [provider]  traZODone  (DESYREL )  50 MG tablet Take 50 mg by mouth at bedtime. 10/12/23 08/28/24  [provider]    Allergies: Metformin, Oxycodone, Oxycodone-acetaminophen , Lisinopril, Morphine, and Morphine and codeine    Review of Systems  All other systems reviewed and are negative.   Updated Vital Signs BP 107/72   Pulse (!) 105   Temp 98.1 F (36.7 C) (Oral)   Resp 18   SpO2 94%   Physical Exam Vitals and nursing note reviewed.  Constitutional:      Appearance: He is well-developed.  HENT:     Head:  Atraumatic.  Cardiovascular:     Rate and Rhythm: Tachycardia present.  Pulmonary:     Effort: Pulmonary effort is normal.  Musculoskeletal:     Cervical back: Neck supple.  Skin:    General: Skin is warm.  Neurological:     Mental Status: He is alert and oriented to person, place, and time.     (all labs ordered are listed, but only abnormal results are displayed) Labs Reviewed  BASIC METABOLIC PANEL WITH GFR - Abnormal; Notable for the following components:      Result Value   Potassium 3.3 (*)    CO2 18 (*)    Glucose, Bld 170 (*)    Calcium  8.6 (*)    All other components within normal limits  MAGNESIUM  - Abnormal; Notable for the following components:   Magnesium  1.5 (*)    All other components within normal limits  CBC - Abnormal; Notable for the following components:   WBC 11.4 (*)    Hemoglobin 12.7 (*)    MCH 25.9 (*)    All other components within normal limits    EKG: EKG Interpretation Date/Time:  Thursday February 08 2024 12:37:01 EDT Ventricular Rate:  205 PR Interval:    QRS Duration:  98 QT Interval:  258 QTC Calculation: 477 R Axis:   70  Text Interpretation: Supraventricular tachycardia Repolarization abnormality, prob rate related PSVT suspected Confirmed by Charlyn Sora [phone]) on 02/08/2024 1:59:58 PM   EKG Interpretation Date/Time:  Thursday February 08 2024 14:16:09 EDT Ventricular Rate:  106 PR Interval:  189 QRS Duration:  81 QT Interval:  347 QTC Calculation: 461 R Axis:   67  Text Interpretation: Sinus tachycardia Supraventricular bigeminy Low voltage, precordial leads Confirmed by Charlyn Sora ([id number]) on 02/08/2024 2:44:36 PM         Radiology: [facility] Chest Port 1 View Result Date: 02/08/2024 CLINICAL DATA:  Supraventricular tachycardia. EXAM: PORTABLE CHEST 1 VIEW COMPARISON:  February 01, 2024. FINDINGS: Stable cardiomediastinal silhouette. Both lungs are clear. The visualized skeletal structures are unremarkable. IMPRESSION: No  active disease. Electronically Signed   By: Lynwood Landy Raddle M.D.   On: 02/08/2024 13:06     .Critical Care  Performed by: Charlyn Sora, MD Authorized by: Charlyn Sora, MD   Critical care provider statement:    Critical care time (minutes):  45   Critical care was necessary to treat or prevent imminent or life-threatening deterioration of the following conditions:  Circulatory failure   Critical care was time spent personally by me on the following activities:  Development of treatment plan with patient or surrogate, discussions with consultants, evaluation of patient's response to treatment, examination of patient, ordering and review of laboratory studies, ordering and review of radiographic studies, ordering and performing treatments and interventions, pulse oximetry, re-evaluation of patient's condition, review of old charts and obtaining history from patient or surrogate    Medications Ordered in the ED  sodium chloride  0.9 % bolus 500 mL (0 mLs Intravenous Stopped 02/08/24 1506)  magnesium  sulfate IVPB 2 g 50 mL (0 g Intravenous Stopped 02/08/24 1506)  diltiazem  (CARDIZEM ) injection 20 mg (20 mg Intravenous Given 02/08/24 1345)  metoprolol  tartrate (LOPRESSOR ) tablet 50 mg (50 mg Oral Given 02/08/24 1454)  flecainide  (TAMBOCOR ) tablet 100 mg (100 mg Oral Given 02/08/24 1454)    Clinical Course as of 02/09/24 0717  Thu Feb 08, 2024  1623 Patient remained stable in normal sinus rhythm.  Will discharge with instructions and prescription for flecainide  as well as increased dosing of metoprolol .  He will follow-up with cardiology in the office.  Return precautions will be worrisome for recurrent A-fib with RVR were discussed in detail. [MP]    Clinical Course User Index [MP] Pamella Ozell LABOR, DO                                 Medical Decision Making Amount and/or Complexity of Data Reviewed Labs: ordered. Radiology: ordered.  Risk Prescription drug management.   68 year old  male comes in with chief complaint of elevated heart rate. Patient has history of PSVT and A-fib.  He was just discharged from the hospital on 8-19 after cardioversion.  Differential diagnosis for him includes A-fib with RVR, a flutter, PSVT.  Currently patient's heart rate is in the 200s.  Systolic blood pressure at 90.  MAP is over 65.  Patient is asymptomatic.  We have applied cardiac pads on them.   I reviewed patient's EKG tracing by EMS.  It appears that patient was just break out of his SVT for 3 to 5 seconds.  I did not see any flutter waves.  I will consult cardiology.  However for now, I will give him diltiazem  bolus to see how he does.  High suspicion for fast flutter at this time.  Reassessment: Case discussed with Dr. Delford, cardiology.  He recommends vagal maneuver and agrees with the diltiazem  and then to discuss the case with him.   Reassessment: After the diltiazem  bolus, patient converted to sinus tachycardia. I have given him 50 mg of short acting metoprolol /Lopressor .  Case consulted with cardiology again.   Dr. Nishan recommends that we increase patient's metoprolol  250 mg twice daily and to start him on flecainide  100 mg twice daily.  Also put in A-fib ambulatory referral if patient is amenable to following up with [facility] instead of [facility].  Patient continues to feel comfortable.  He is willing to follow-up with: Cardiology.  We will continue to monitor him for another hour.  If patient remains in sinus tachycardia, then he will be discharged.  His care will be signed out to incoming team.  7:17 AM There was a typing error on Cardiology recommendation. Pts metoprolol  was increased to 150 mg bid, and d/c AVS and med reconciliation (patient facing documents) reflected the proper change to 150 mg bid.  Final diagnoses:  SVT (supraventricular tachycardia) [facility])    ED Discharge Orders     None          Charlyn Sora, MD 02/08/24 1459    Charlyn Sora,  MD 02/08/24 1529    Charlyn Sora, MD 02/09/24 [phone]

## 2024-02-09 ENCOUNTER — Other Ambulatory Visit: Payer: Self-pay

## 2024-02-09 ENCOUNTER — Encounter (HOSPITAL_COMMUNITY): Payer: Self-pay | Admitting: Emergency Medicine

## 2024-02-09 ENCOUNTER — Emergency Department (HOSPITAL_COMMUNITY)

## 2024-02-09 ENCOUNTER — Emergency Department (HOSPITAL_COMMUNITY): Admission: EM | Admit: 2024-02-09 | Discharge: 2024-02-10 | Disposition: A | Discharge status: Home / Self Care

## 2024-02-09 DIAGNOSIS — W208XXA Other cause of strike by thrown, projected or falling object, initial encounter: Secondary | ICD-10-CM | POA: Insufficient documentation

## 2024-02-09 DIAGNOSIS — W19XXXA Unspecified fall, initial encounter: Secondary | ICD-10-CM

## 2024-02-09 DIAGNOSIS — S8002XA Contusion of left knee, initial encounter: Secondary | ICD-10-CM | POA: Insufficient documentation

## 2024-02-09 DIAGNOSIS — Z7901 Long term (current) use of anticoagulants: Secondary | ICD-10-CM | POA: Diagnosis not present

## 2024-02-09 DIAGNOSIS — R000 Tachycardia, unspecified: Secondary | ICD-10-CM | POA: Diagnosis not present

## 2024-02-09 DIAGNOSIS — M79604 Pain in right leg: Secondary | ICD-10-CM | POA: Diagnosis present

## 2024-02-09 NOTE — ED Notes (Signed)
 This RN called and spoke with Marissa(Front Desk) and she states that no report is needed since Pt is in [facility]. Only request was to give a call back when Pt is on his way back to facility.

## 2024-02-09 NOTE — ED Provider Notes (Signed)
 [place] EMERGENCY DEPARTMENT AT [facility] Provider Note   CSN: [id number] Arrival date & time: 02/09/24  1524     Patient presents with: Fall and Leg Pain   Alan Lin is a 68 y.o. male.   Patient complains of a fall at the facility where he resides.  Patient reports he lives at [facility].  Patient states that he has 2 aides that take care of him.  Patient states that they accidentally dropped him onto his right knee.  Patient states that he is not having any pain.  Patient states he struck on his left knee.  Patient states that he also landed on his bottom.  He reports he is not having any pain there   Fall  Leg Pain      Prior to Admission medications   Medication Sig Start Date End Date Taking? Authorizing Provider  acetaminophen  (TYLENOL ) 325 MG tablet Take 1 tablet (325 mg total) by mouth every 6 (six) hours as needed for mild pain (or Fever >/= 101). Patient not taking: Reported on 12/13/2023 07/28/22   Elgergawy, Brayton RAMAN, MD  apixaban  (ELIQUIS ) 5 MG TABS tablet Take 1 tablet (5 mg total) by mouth 2 (two) times daily. 02/06/24   Patsy Alm, MD  atorvastatin  (LIPITOR) 10 MG tablet Take 1 tablet by mouth daily. 10/12/23 10/06/24  [provider]  BIOTIN PO Take 1 tablet by mouth daily.    [provider]  cyclobenzaprine  (FLEXERIL ) 10 MG tablet Take 10 mg by mouth at bedtime.    [provider]  Docusate Sodium  (DSS) 100 MG CAPS Take 1 capsule by mouth daily. 10/12/23 10/11/24  [provider]  DULoxetine  (CYMBALTA ) 60 MG capsule Take 1 capsule by mouth daily. 10/12/23   [provider]  flecainide  (TAMBOCOR ) 100 MG tablet Take 1 tablet (100 mg total) by mouth 2 (two) times daily. 02/08/24   Charlyn Sora, MD  gabapentin  (NEURONTIN ) 300 MG capsule Take 300-1,500 mg by mouth daily. Take 300mg  (1 capsule) by mouth in the mornings and 1500mg  (5 capsules) in the evening. 08/03/16   [provider]   GAVILAX 17 GM/SCOOP powder Take 17 g by mouth daily as needed for mild constipation or moderate constipation. 10/01/23   [provider]  JARDIANCE  10 MG TABS tablet Take 10 mg by mouth daily. 08/09/22   [provider]  magnesium  30 MG tablet Take 30 mg by mouth in the morning.    [provider]  methocarbamol  (ROBAXIN ) 500 MG tablet Take 1 tablet (500 mg total) by mouth every 8 (eight) hours as needed for muscle spasms. 01/18/24 01/07/25  Patsy Alm, MD  metoprolol  tartrate (LOPRESSOR ) 100 MG tablet Take 1.5 tablets (150 mg total) by mouth 2 (two) times daily. 02/08/24   Pamella Ozell LABOR, DO  Multiple Vitamins-Minerals (MULTIVITAMIN WITH MINERALS) tablet Take 1 tablet by mouth daily.    [provider]  Semaglutide  (RYBELSUS ) 7 MG TABS Take 1 tablet by mouth daily.    [provider]  senna (SENOKOT) 8.6 MG tablet Take 1 tablet by mouth daily. 10/12/23 10/11/24  [provider]  tamsulosin  (FLOMAX ) 0.4 MG CAPS capsule Take 0.4 mg by mouth daily. 09/28/22   [provider]  traZODone  (DESYREL ) 50 MG tablet Take 50 mg by mouth at bedtime. 10/12/23 08/28/24  [provider]    Allergies: Metformin, Oxycodone, Oxycodone-acetaminophen , Lisinopril, Morphine, and Morphine and codeine    Review of Systems  All other systems reviewed  and are negative.   Updated Vital Signs BP 127/80   Pulse 90   Temp 98 F (36.7 C) (Oral)   Resp 19   SpO2 96%   Physical Exam Vitals reviewed.  Constitutional:      Appearance: Normal appearance.  HENT:     Head: Normocephalic and atraumatic.  Cardiovascular:     Rate and Rhythm: Normal rate.  Pulmonary:     Effort: Pulmonary effort is normal.  Abdominal:     General: Abdomen is flat.  Musculoskeletal:        General: Normal range of motion.     Comments: Healing abrasion left knee nontender, right knee nontender, small area of erythema,  Skin:    General: Skin is warm.   Neurological:     General: No focal deficit present.     Mental Status: He is alert.     (all labs ordered are listed, but only abnormal results are displayed) Labs Reviewed - No data to display  EKG: None  Radiology: DG Pelvis 1-2 Views Result Date: 02/09/2024 CLINICAL DATA:  Fall. EXAM: PELVIS - 1-2 VIEW COMPARISON:  07/23/2022. FINDINGS: Pelvis is intact with normal and symmetric sacroiliac joints. No acute fracture or dislocation. No aggressive osseous lesion. Visualized sacral arcuate lines are unremarkable. Unremarkable symphysis pubis. There are mild degenerative changes of bilateral hip joints without significant joint space narrowing. Osteophytosis of the superior acetabulum. No radiopaque foreign bodies. Neurostimulator device battery pack noted overlying the right iliac wing with the lead overlying the left lower sacral foramina. IMPRESSION: No acute osseous abnormality of the pelvis. Electronically Signed   By: Ree Molt M.D.   On: 02/09/2024 17:03   DG Knee Complete 4 Views Right Result Date: 02/09/2024 CLINICAL DATA:  fall. EXAM: RIGHT KNEE - COMPLETE 4+ VIEW COMPARISON:  None Available. FINDINGS: No acute fracture or dislocation. No aggressive osseous lesion. There are degenerative changes of the knee joint in the form of mildly reduced medial tibio-femoral compartment joint space and osteophytosis. 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 No knee effusion or focal soft tissue swelling. No radiopaque foreign bodies. IMPRESSION: No acute osseous abnormality of the right knee joint. Mild degenerative changes, as described above. Electronically Signed   By: Ree Molt M.D.   On: 02/09/2024 17:02   DG Chest Port 1 View Result Date: 02/08/2024 CLINICAL DATA:  Supraventricular tachycardia. EXAM: PORTABLE CHEST 1 VIEW COMPARISON:  February 01, 2024. FINDINGS: Stable cardiomediastinal silhouette. Both lungs are clear. The visualized skeletal structures are unremarkable.  IMPRESSION: No active disease. Electronically Signed   By: Lynwood Landy Raddle M.D.   On: 02/08/2024 13:06     Procedures   Medications Ordered in the ED - No data to display                                  Medical Decision Making Patient reports that his care aide accidentally dropped him and he struck his right knee.  Patient states he was sent here for evaluation.  He denies any pain  Amount and/or Complexity of Data Reviewed Independent Historian: EMS    Details: Patient brought in by EMS for further evaluation after a fall Radiology: ordered.    Details: X-ray right knee shows no fracture, pelvis no fracture  Risk Risk Details: Patient advised of x-ray results.  He states he wants to return to his facility.  Patient reports that he does not  feel like he needs any further evaluation.  Patient states he does not need anything for pain.        Final diagnoses:  Fall, initial encounter  Contusion of left knee, initial encounter    ED Discharge Orders     None       An After Visit Summary was printed and given to the patient.    Flint Sonny MARLA DEVONNA 02/09/24 2120    Dreama Longs, MD 02/10/24 [phone]

## 2024-02-09 NOTE — Discharge Instructions (Addendum)
 Take your medications as directed

## 2024-02-09 NOTE — ED Notes (Signed)
 [facility] transportation set-up for Pt's return back to facility.

## 2024-02-09 NOTE — ED Triage Notes (Signed)
 Pt BIB EMS from [facility], c/o right knee pain r/t fall. Per staff, pt was being transferred from bed to w/c x2 and pt missed w/c and lowered to the floor. Denies hitting head, no LOC, started eliquis  yesterday. Staff reported pt is no longer appropriate for the ALF  BP 152/80 P 90 RR 18 SpO2 97% CBG 140

## 2024-02-10 ENCOUNTER — Emergency Department (HOSPITAL_COMMUNITY)

## 2024-02-10 LAB — COMPREHENSIVE METABOLIC PANEL WITH GFR
ALT: 11 U/L (ref 0–44)
AST: 13 U/L — ABNORMAL LOW (ref 15–41)
Albumin: 3.1 g/dL — ABNORMAL LOW (ref 3.5–5.0)
Alkaline Phosphatase: 75 U/L (ref 38–126)
Anion gap: 14 (ref 5–15)
BUN: 10 mg/dL (ref 8–23)
CO2: 22 mmol/L (ref 22–32)
Calcium: 8.8 mg/dL — ABNORMAL LOW (ref 8.9–10.3)
Chloride: 99 mmol/L (ref 98–111)
Creatinine, Ser: 0.62 mg/dL (ref 0.61–1.24)
GFR, Estimated: >60 mL/min (ref >60)
Glucose, Bld: 130 mg/dL — ABNORMAL HIGH (ref 70–99)
Potassium: 3.4 mmol/L — ABNORMAL LOW (ref 3.5–5.1)
Sodium: 135 mmol/L (ref 135–145)
Total Bilirubin: 1.1 mg/dL (ref 0.0–1.2)
Total Protein: 6.7 g/dL (ref 6.5–8.1)

## 2024-02-10 LAB — CBC WITH DIFFERENTIAL/PLATELET
Abs Granulocyte: 7.2 K/uL — ABNORMAL HIGH (ref 1.5–6.5)
Abs Immature Granulocytes: 0.06 K/uL (ref 0.00–0.07)
Basophils Absolute: 0 K/uL (ref 0.0–0.1)
Basophils Relative: 0 %
Eosinophils Absolute: 0.1 K/uL (ref 0.0–0.5)
Eosinophils Relative: 1 %
HCT: 50.9 % (ref 39.0–52.0)
Hemoglobin: 15.8 g/dL (ref 13.0–17.0)
Immature Granulocytes: 1 %
Lymphocytes Relative: 13 %
Lymphs Abs: 1.2 K/uL (ref 0.7–4.0)
MCH: 25.2 pg — ABNORMAL LOW (ref 26.0–34.0)
MCHC: 31 g/dL (ref 30.0–36.0)
MCV: 81.3 fL (ref 80.0–100.0)
Monocytes Absolute: 0.7 K/uL (ref 0.1–1.0)
Monocytes Relative: 7 %
Neutro Abs: 7.2 K/uL (ref 1.7–7.7)
Neutrophils Relative %: 78 %
Platelets: 342 K/uL (ref 150–400)
RBC: 6.26 MIL/uL — ABNORMAL HIGH (ref 4.22–5.81)
RDW: 14.6 % (ref 11.5–15.5)
WBC: 9.3 K/uL (ref 4.0–10.5)
nRBC: 0 % (ref 0.0–0.2)

## 2024-02-10 LAB — TROPONIN I (HIGH SENSITIVITY)
Troponin I (High Sensitivity): 143 ng/L (ref <18)
Troponin I (High Sensitivity): 146 ng/L (ref <18)

## 2024-02-10 MED ORDER — SODIUM CHLORIDE 0.9 % IV SOLN: Freq: Once | INTRAVENOUS | Status: DC

## 2024-02-10 MED ORDER — METOPROLOL TARTRATE 25 MG PO TABS
100.0000 mg | ORAL_TABLET | Freq: Once | ORAL | Status: AC
  Administered 2024-02-10: 100 mg via ORAL
  Filled 2024-02-10: qty 4

## 2024-02-10 MED ORDER — DILTIAZEM LOAD VIA INFUSION
10.0000 mg | Freq: Once | INTRAVENOUS | Status: AC
  Administered 2024-02-10: 10 mg via INTRAVENOUS
  Filled 2024-02-10: qty 10

## 2024-02-10 MED ORDER — APIXABAN 2.5 MG PO TABS
5.0000 mg | ORAL_TABLET | Freq: Once | ORAL | Status: AC
  Administered 2024-02-10: 5 mg via ORAL
  Filled 2024-02-10: qty 2

## 2024-02-10 MED ORDER — DILTIAZEM HCL-DEXTROSE 125-5 MG/125ML-% IV SOLN (PREMIX)
5.0000 mg/h | INTRAVENOUS | Status: DC
  Administered 2024-02-10: 5 mg/h via INTRAVENOUS
  Filled 2024-02-10: qty 125

## 2024-02-10 NOTE — ED Notes (Signed)
 [facility] called for a f/u on when Pt will be picked up. But states they are unable to answer that at this time Pt remains on list and awaits to be picked up.

## 2024-02-10 NOTE — ED Provider Notes (Signed)
 Is care of the patient at 0 700.  Briefly patient has been boarding after a mechanical fall.  Awaiting transfer to his facility.  There was some delay for transport.  I was notified that the patient had a heart rate in the 140s.  EKG was consistent I think with atrial flutter with 2 1 block.  Patient did not receive any of his home medications while he boarded here.  Plan to attempt to control his rate.  Reassess.   Emil Share, DO 02/10/24 [phone]

## 2024-02-10 NOTE — ED Notes (Addendum)
 Due to pt's HR maintaining 140+, pt placed in room on cardiac monitor. EDP aware. NAD noted.

## 2024-02-10 NOTE — ED Provider Notes (Signed)
 Patient was seen by me here yesterday and discharged.  Patient was held all night in the emergency department awaiting [facility] transfer.  Patient is noted to have an elevated heart rate of 147. EKG shows sinus tachycardia.  Patient denies any chest pain he denies any symptoms.  Dr. Emil into see and examine patient patient is started on diltiazem .  Patient is given his dose of metoprolol .  Chest x-ray shows no acute findings.  I checked a c-Met and CBC on patient patient has normal white blood cell count hemoglobin is 15.8.  Patient was given his Eliquis  dosage as well.  Patient started on Cardizem  and after initial 10 mg patient's heart rate has stabilized is currently at 95. Patient's 1st troponin is 143 repeat is 146.  I reevaluated patient.  He is angry that he has been here so long.  He is refusing any further evaluation and demanding to be transported back to his facility. Pt's heart rate currently in the 90's.  He denies any pain.    Flint Sonny POUR, PA-C 02/10/24 1618    Floyd, Dan, DO 02/11/24 0700

## 2024-03-06 ENCOUNTER — Other Ambulatory Visit (HOSPITAL_COMMUNITY): Payer: Self-pay

## 2024-03-16 ENCOUNTER — Other Ambulatory Visit (HOSPITAL_COMMUNITY): Payer: Self-pay
